# Patient Record
Sex: Male | Born: 1950
Health system: Southern US, Community
[De-identification: ages and names within clinical notes are randomized; demographics above are authoritative.]

## PROBLEM LIST (undated history)

## (undated) DIAGNOSIS — K219 Gastro-esophageal reflux disease without esophagitis: Secondary | ICD-10-CM

## (undated) DIAGNOSIS — K759 Inflammatory liver disease, unspecified: Secondary | ICD-10-CM

## (undated) DIAGNOSIS — I700 Atherosclerosis of aorta: Secondary | ICD-10-CM

## (undated) DIAGNOSIS — I219 Acute myocardial infarction, unspecified: Secondary | ICD-10-CM

## (undated) DIAGNOSIS — I509 Heart failure, unspecified: Secondary | ICD-10-CM

## (undated) DIAGNOSIS — I10 Essential (primary) hypertension: Secondary | ICD-10-CM

## (undated) DIAGNOSIS — B1920 Unspecified viral hepatitis C without hepatic coma: Secondary | ICD-10-CM

## (undated) DIAGNOSIS — N529 Male erectile dysfunction, unspecified: Secondary | ICD-10-CM

## (undated) DIAGNOSIS — I209 Angina pectoris, unspecified: Secondary | ICD-10-CM

## (undated) DIAGNOSIS — I2510 Atherosclerotic heart disease of native coronary artery without angina pectoris: Secondary | ICD-10-CM

## (undated) DIAGNOSIS — M1990 Unspecified osteoarthritis, unspecified site: Secondary | ICD-10-CM

## (undated) DIAGNOSIS — J449 Chronic obstructive pulmonary disease, unspecified: Secondary | ICD-10-CM

## (undated) DIAGNOSIS — J189 Pneumonia, unspecified organism: Secondary | ICD-10-CM

## (undated) DIAGNOSIS — E785 Hyperlipidemia, unspecified: Secondary | ICD-10-CM

## (undated) HISTORY — DX: Atherosclerotic heart disease of native coronary artery without angina pectoris: I25.10

## (undated) HISTORY — DX: Chronic obstructive pulmonary disease, unspecified: J44.9

## (undated) HISTORY — DX: Unspecified osteoarthritis, unspecified site: M19.90

## (undated) HISTORY — PX: NASAL SINUS SURGERY: SHX719

## (undated) HISTORY — PX: ABDOMINAL EXPLORATION SURGERY: SHX538

## (undated) HISTORY — DX: Heart failure, unspecified: I50.9

## (undated) HISTORY — PX: COLONOSCOPY: SHX174

---

## 2008-05-14 ENCOUNTER — Ambulatory Visit: Payer: Self-pay | Admitting: Gastroenterology

## 2008-05-21 ENCOUNTER — Ambulatory Visit: Payer: Self-pay | Admitting: Gastroenterology

## 2008-09-10 ENCOUNTER — Ambulatory Visit: Payer: Self-pay | Admitting: Gastroenterology

## 2019-02-04 ENCOUNTER — Other Ambulatory Visit: Payer: Self-pay

## 2019-02-04 ENCOUNTER — Encounter: Payer: Self-pay | Admitting: Nurse Practitioner

## 2019-02-04 ENCOUNTER — Ambulatory Visit (INDEPENDENT_AMBULATORY_CARE_PROVIDER_SITE_OTHER): Payer: Medicare Other | Admitting: Nurse Practitioner

## 2019-02-04 VITALS — BP 138/82 | HR 86 | Temp 99.2°F | Ht 67.0 in | Wt 138.6 lb

## 2019-02-04 DIAGNOSIS — I10 Essential (primary) hypertension: Secondary | ICD-10-CM | POA: Insufficient documentation

## 2019-02-04 DIAGNOSIS — F17219 Nicotine dependence, cigarettes, with unspecified nicotine-induced disorders: Secondary | ICD-10-CM | POA: Diagnosis not present

## 2019-02-04 DIAGNOSIS — J449 Chronic obstructive pulmonary disease, unspecified: Secondary | ICD-10-CM | POA: Diagnosis not present

## 2019-02-04 DIAGNOSIS — R030 Elevated blood-pressure reading, without diagnosis of hypertension: Secondary | ICD-10-CM | POA: Insufficient documentation

## 2019-02-04 DIAGNOSIS — N529 Male erectile dysfunction, unspecified: Secondary | ICD-10-CM | POA: Diagnosis not present

## 2019-02-04 DIAGNOSIS — Z87891 Personal history of nicotine dependence: Secondary | ICD-10-CM | POA: Insufficient documentation

## 2019-02-04 MED ORDER — PROAIR HFA 108 (90 BASE) MCG/ACT IN AERS: 2.0000 | INHALATION_SPRAY | RESPIRATORY_TRACT | 3 refills | Status: DC | PRN

## 2019-02-04 MED ORDER — SYMBICORT 160-4.5 MCG/ACT IN AERO: 2.0000 | INHALATION_SPRAY | Freq: Two times a day (BID) | RESPIRATORY_TRACT | 3 refills | Status: DC

## 2019-02-04 NOTE — Assessment & Plan Note (Signed)
Chronic, stable.  Continue current inhaler regimen.  Recommend smoking cessation.  Spirometry next visit.  Return in 4 weeks for physical.

## 2019-02-04 NOTE — Assessment & Plan Note (Signed)
Chronic, ongoing.  Continue Sildenafil as needed.  Will bring pill bottle to next visit to ensure correct dose documented.

## 2019-02-04 NOTE — Assessment & Plan Note (Signed)
I have recommended complete cessation of tobacco use. I have discussed various options available for assistance with tobacco cessation including over the counter methods (Nicotine gum, patch and lozenges). We also discussed prescription options (Chantix, Nicotine Inhaler / Nasal Spray). The patient is not interested in pursuing any prescription tobacco cessation options at this time. Recommend he obtain lung CT scan CA screening.

## 2019-02-04 NOTE — Patient Instructions (Signed)
Pneumococcal Conjugate Vaccine (PCV13): What You Need to Know 1. Why get vaccinated? Pneumococcal conjugate vaccine (PCV13) can prevent pneumococcal disease. Pneumococcal disease refers to any illness caused by pneumococcal bacteria. These bacteria can cause many types of illnesses, including pneumonia, which is an infection of the lungs. Pneumococcal bacteria are one of the most common causes of pneumonia. Besides pneumonia, pneumococcal bacteria can also cause:  Ear infections  Sinus infections  Meningitis (infection of the tissue covering the brain and spinal cord)  Bacteremia (bloodstream infection) Anyone can get pneumococcal disease, but children under 2 years of age, people with certain medical conditions, adults 65 years or older, and cigarette smokers are at the highest risk. Most pneumococcal infections are mild. However, some can result in long-term problems, such as brain damage or hearing loss. Meningitis, bacteremia, and pneumonia caused by pneumococcal disease can be fatal. 2. PCV13 PCV13 protects against 13 types of bacteria that cause pneumococcal disease. Infants and young children usually need 4 doses of pneumococcal conjugate vaccine, at 2, 4, 6, and 12-15 months of age. In some cases, a child might need fewer than 4 doses to complete PCV13 vaccination. A dose of PCV23 vaccine is also recommended for anyone 2 years or older with certain medical conditions if they did not already receive PCV13. This vaccine may be given to adults 65 years or older based on discussions between the patient and health care provider. 3. Talk with your health care provider Tell your vaccine provider if the person getting the vaccine:  Has had an allergic reaction after a previous dose of PCV13, to an earlier pneumococcal conjugate vaccine known as PCV7, or to any vaccine containing diphtheria toxoid (for example, DTaP), or has any severe, life-threatening allergies.  In some cases, your health  care provider may decide to postpone PCV13 vaccination to a future visit. People with minor illnesses, such as a cold, may be vaccinated. People who are moderately or severely ill should usually wait until they recover before getting PCV13. Your health care provider can give you more information. 4. Risks of a vaccine reaction  Redness, swelling, pain, or tenderness where the shot is given, and fever, loss of appetite, fussiness (irritability), feeling tired, headache, and chills can happen after PCV13. Young children may be at increased risk for seizures caused by fever after PCV13 if it is administered at the same time as inactivated influenza vaccine. Ask your health care provider for more information. People sometimes faint after medical procedures, including vaccination. Tell your provider if you feel dizzy or have vision changes or ringing in the ears. As with any medicine, there is a very remote chance of a vaccine causing a severe allergic reaction, other serious injury, or death. 5. What if there is a serious problem? An allergic reaction could occur after the vaccinated person leaves the clinic. If you see signs of a severe allergic reaction (hives, swelling of the face and throat, difficulty breathing, a fast heartbeat, dizziness, or weakness), call 9-1-1 and get the person to the nearest hospital. For other signs that concern you, call your health care provider. Adverse reactions should be reported to the Vaccine Adverse Event Reporting System (VAERS). Your health care provider will usually file this report, or you can do it yourself. Visit the VAERS website at www.vaers.hhs.gov or call 1-800-822-7967. VAERS is only for reporting reactions, and VAERS staff do not give medical advice. 6. The National Vaccine Injury Compensation Program The National Vaccine Injury Compensation Program (VICP) is a federal program   that was created to compensate people who may have been injured by certain  vaccines. Visit the VICP website at www.hrsa.gov/vaccinecompensation or call 1-800-338-2382 to learn about the program and about filing a claim. There is a time limit to file a claim for compensation. 7. How can I learn more?  Ask your health care provider.  Call your local or state health department.  Contact the Centers for Disease Control and Prevention (CDC): ? Call 1-800-232-4636 (1-800-CDC-INFO) or ? Visit CDC's website at www.cdc.gov/vaccines Vaccine Information Statement PCV13 Vaccine (05/14/2018) This information is not intended to replace advice given to you by your health care provider. Make sure you discuss any questions you have with your health care provider. Document Released: 04/29/2006 Document Revised: 10/21/2018 Document Reviewed: 02/11/2018 Elsevier Patient Education  2020 [employer]  

## 2019-02-04 NOTE — Assessment & Plan Note (Signed)
Initial BP elevated with repeat at goal.  He endorses initial BP is "always" high.  Recommend he check BP at home a few mornings a week and document for provider.
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
## 2019-03-10 ENCOUNTER — Other Ambulatory Visit: Payer: Self-pay

## 2019-03-10 ENCOUNTER — Ambulatory Visit (INDEPENDENT_AMBULATORY_CARE_PROVIDER_SITE_OTHER): Payer: Medicare Other | Admitting: Nurse Practitioner

## 2019-03-10 ENCOUNTER — Encounter: Payer: Self-pay | Admitting: Nurse Practitioner

## 2019-03-10 VITALS — BP 138/72 | HR 66 | Temp 98.2°F | Ht 67.0 in | Wt 138.0 lb

## 2019-03-10 DIAGNOSIS — R030 Elevated blood-pressure reading, without diagnosis of hypertension: Secondary | ICD-10-CM

## 2019-03-10 DIAGNOSIS — Z114 Encounter for screening for human immunodeficiency virus [HIV]: Secondary | ICD-10-CM

## 2019-03-10 DIAGNOSIS — J449 Chronic obstructive pulmonary disease, unspecified: Secondary | ICD-10-CM

## 2019-03-10 DIAGNOSIS — Z1159 Encounter for screening for other viral diseases: Secondary | ICD-10-CM

## 2019-03-10 DIAGNOSIS — Z0000 Encounter for general adult medical examination without abnormal findings: Secondary | ICD-10-CM

## 2019-03-10 DIAGNOSIS — F17219 Nicotine dependence, cigarettes, with unspecified nicotine-induced disorders: Secondary | ICD-10-CM | POA: Diagnosis not present

## 2019-03-10 NOTE — Assessment & Plan Note (Signed)
Ongoing with initial BP elevated, but repeat improved at goal.  Recommend he monitor BP at home three mornings a week and document for provider, educated him on this.  Continue diet and exercise focus.  Return in 6 months for follow-up.

## 2019-03-10 NOTE — Assessment & Plan Note (Addendum)
Chronic, stable.  Spirometry FEV1/FVC 90% and FEV1 76%.  Minimal use of Proair.  Recommend complete smoking cessation to slow down progression of disease process.  Continue current medication regimen.  He refuses Lung CA Screening CT at this time.  Return in 6 months for follow-up.

## 2019-03-10 NOTE — Progress Notes (Signed)
BP 138/72 (BP Location: Left Arm, Patient Position: Sitting)   Pulse 66   Temp 98.2 F (36.8 C) (Oral)   Ht 5\' 7"  (1.702 m)   Wt 138 lb (62.6 kg)   SpO2 98%   BMI 21.61 kg/m    Subjective:    Patient ID: Andrew Potts, male    DOB: 09-09-1950, 68 y.o.   MRN: [id number]  HPI: Andrew Potts is a 68 y.o. male presenting on 03/10/2019 for comprehensive medical examination. Current medical complaints include:none  He currently lives with: self Interim Problems from his last visit: no   COPD Using Symbicort twice day. Continues to smoke 1/2 PPD, at times less.  Has smoked since age 68.  He has tried Chantix in past which caused stomach upset and Wellbutrin he tried years ago, but does not recall side effect.  Recommended CT lung CA screening, he wishes to hold off at this time. COPD status: stable Satisfied with current treatment?: yes Oxygen use: no Dyspnea frequency:  Cough frequency:  Rescue inhaler frequency: uses at times twice a week. Limitation of activity: no Productive cough:  Last Spirometry:  Pneumovax: has one, can not recall what year Influenza: Up to Date   WHITE COAT HYPERTENSION: Endorses similar at previous PCP.  States his BP is always initially elevated, gets nervous with provider offices, but then trends down on recheck.   Aspirin: no Recurrent headaches: no Visual changes: no Palpitations: no Dyspnea: no Chest pain: no Lower extremity edema: no Dizzy/lightheaded: no  Functional Status Survey: Is the patient deaf or have difficulty hearing?: No Does the patient have difficulty seeing, even when wearing glasses/contacts?: No Does the patient have difficulty concentrating, remembering, or making decisions?: No Does the patient have difficulty walking or climbing stairs?: No Does the patient have difficulty dressing or bathing?: No Does the patient have difficulty doing errands alone such as visiting a doctor's office or shopping?: No  FALL RISK:  Fall Risk  03/10/2019 02/04/2019  Falls in the past year? 0 0  Number falls in past yr: 0 0  Injury with Fall? 0 0  Follow up Falls evaluation completed Falls evaluation completed    Depression Screen [facility] 03/10/2019 02/04/2019  Decreased Interest 0 0  Down, Depressed, Hopeless 0 0  PHQ - 2 Score 0 0  Altered sleeping 0 0  Tired, decreased energy 0 0  Change in appetite 0 0  Feeling bad or failure about yourself  0 0  Trouble concentrating 0 0  Moving slowly or fidgety/restless 0 0  Suicidal thoughts 0 0  PHQ-9 Score 0 0  Difficult doing work/chores Not difficult at all Not difficult at all    Advanced Directives <no information>  Past Medical History:  Past Medical History:  Diagnosis Date  . Arthritis   . COPD (chronic obstructive pulmonary disease) (HCC)     Surgical History:  Past Surgical History:  Procedure Laterality Date  . ABDOMINAL EXPLORATION SURGERY      Medications:  Current Outpatient Medications on File Prior to Visit  Medication Sig  . PROAIR HFA 108 (90 Base) MCG/ACT inhaler Inhale 2 puffs into the lungs every 4 (four) hours as needed.  . sildenafil (REVATIO) 20 MG tablet Take 20 mg by mouth as needed. Daily only  . SYMBICORT 160-4.5 MCG/ACT inhaler Inhale 2 puffs into the lungs 2 (two) times daily.   No current facility-administered medications on file prior to visit.     Allergies:  Allergies  Allergen Reactions  . Codone [Hydrocodone] Other (See Comments)    Abdominal pain  . Levaquin [Levofloxacin] Swelling    Social History:  Social History   Socioeconomic History  . Marital status: Divorced    Spouse name: Not on file  . Number of children: Not on file  . Years of education: Not on file  . Highest education level: Not on file  Occupational History  . Occupation: unload trucks    Comment: [employer]  Social Needs  . Financial resource strain: Not hard at all  . Food insecurity    Worry: Never true     Inability: Never true  . Transportation needs    Medical: No    Non-medical: No  Tobacco Use  . Smoking status: Current Every Day Smoker    Packs/day: 0.50    Years: 47.00    Pack years: 23.50  . Smokeless tobacco: Never Used  Substance and Sexual Activity  . Alcohol use: Not Currently  . Drug use: Never  . Sexual activity: Yes  Lifestyle  . Physical activity    Days per week: 6 days    Minutes per session: 30 min  . Stress: Not at all  Relationships  . Social Musician on phone: Three times a week    Gets together: Three times a week    Attends religious service: Never    Active member of club or organization: No    Attends meetings of clubs or organizations: Never    Relationship status: Not on file  . Intimate partner violence    Fear of current or ex partner: No    Emotionally abused: No    Physically abused: No    Forced sexual activity: No  Other Topics Concern  . Not on file  Social History Narrative  . Not on file   Social History   Tobacco Use  Smoking Status Current Every Day Smoker  . Packs/day: 0.50  . Years: 47.00  . Pack years: 23.50  Smokeless Tobacco Never Used   Social History   Substance and Sexual Activity  Alcohol Use Not Currently    Family History:  Family History  Problem Relation Age of Onset  . Alcohol abuse Father   . Alcohol abuse Brother   . Aneurysm Brother     Past medical history, surgical history, medications, allergies, family history and social history reviewed with patient today and changes made to appropriate areas of the chart.   Review of Systems - negative All other ROS negative except what is listed above and in the HPI.      Objective:    BP 138/72 (BP Location: Left Arm, Patient Position: Sitting)   Pulse 66   Temp 98.2 F (36.8 C) (Oral)   Ht 5\' 7"  (1.702 m)   Wt 138 lb (62.6 kg)   SpO2 98%   BMI 21.61 kg/m   Wt Readings from Last 3 Encounters:  03/10/19 138 lb (62.6 kg)  02/04/19 138  lb 9.6 oz (62.9 kg)    Physical Exam Vitals signs and nursing note reviewed.  Constitutional:      General: He is awake. He is not in acute distress.    Appearance: He is well-developed. He is not ill-appearing.  HENT:     Head: Normocephalic and atraumatic.     Right Ear: Hearing, tympanic membrane, ear canal and external ear normal. No drainage.     Left Ear: Hearing, tympanic membrane, ear canal and external  ear normal. No drainage.     Nose: Nose normal.     Mouth/Throat:     Pharynx: Oropharynx is clear. Uvula midline.  Eyes:     General: Lids are normal.        Right eye: No discharge.        Left eye: No discharge.     Extraocular Movements: Extraocular movements intact.     Conjunctiva/sclera: Conjunctivae normal.     Pupils: Pupils are equal, round, and reactive to light.     Visual Fields: Right eye visual fields normal and left eye visual fields normal.  Neck:     Musculoskeletal: Normal range of motion and neck supple.     Thyroid: No thyromegaly.     Vascular: No carotid bruit.  Cardiovascular:     Rate and Rhythm: Normal rate and regular rhythm.     Heart sounds: Normal heart sounds, S1 normal and S2 normal. No murmur. No gallop.   Pulmonary:     Effort: Pulmonary effort is normal. No accessory muscle usage or respiratory distress.     Breath sounds: Normal breath sounds.  Abdominal:     General: Bowel sounds are normal.     Palpations: Abdomen is soft. There is no hepatomegaly or splenomegaly.     Tenderness: There is no abdominal tenderness.     Hernia: No hernia is present.  Genitourinary:    Scrotum/Testes: Normal.        Right: Mass not present.        Left: Mass not present.     Comments: Deferred per patient request. Musculoskeletal: Normal range of motion.     Right lower leg: No edema.     Left lower leg: No edema.  Lymphadenopathy:     Head:     Right side of head: No submental, submandibular, tonsillar, preauricular or posterior auricular  adenopathy.     Left side of head: No submental, submandibular, tonsillar, preauricular or posterior auricular adenopathy.     Cervical: No cervical adenopathy.  Skin:    General: Skin is warm and dry.     Capillary Refill: Capillary refill takes less than 2 seconds.     Findings: No rash.  Neurological:     Mental Status: He is alert and oriented to person, place, and time.     Deep Tendon Reflexes: Reflexes are normal and symmetric.  Psychiatric:        Attention and Perception: Attention normal.        Mood and Affect: Mood normal.        Speech: Speech normal.        Behavior: Behavior normal. Behavior is cooperative.        Thought Content: Thought content normal.        Cognition and Memory: Cognition normal.        Judgment: Judgment normal.    6CIT Screen 03/10/2019  What Year? 0 points  What month? 0 points  What time? 0 points  Count back from 20 0 points  Months in reverse 0 points  Repeat phrase 0 points  Total Score 0    No results found for this or any previous visit.    Assessment & Plan:   Problem List Items Addressed This Visit      Cardiovascular and Mediastinum   White coat syndrome without diagnosis of hypertension    Ongoing with initial BP elevated, but repeat improved at goal.  Recommend he monitor BP at home three mornings  a week and document for provider, educated him on this.  Continue diet and exercise focus.  Return in 6 months for follow-up.      Relevant Orders   Comprehensive metabolic panel     Respiratory   COPD (chronic obstructive pulmonary disease) (HCC) - Primary    Chronic, stable.  Spirometry FEV1/FVC 90% and FEV1 76%.  Minimal use of Proair.  Recommend complete smoking cessation to slow down progression of disease process.  Continue current medication regimen.  He refuses Lung CA Screening CT at this time.  Return in 6 months for follow-up.      Relevant Orders   Spirometry with Graph (Completed)     Nervous and Auditory    Nicotine dependence, cigarettes, w unsp disorders    I have recommended complete cessation of tobacco use. I have discussed various options available for assistance with tobacco cessation including over the counter methods (Nicotine gum, patch and lozenges). We also discussed prescription options (Chantix, Nicotine Inhaler / Nasal Spray). The patient is not interested in pursuing any prescription tobacco cessation options at this time.       Other Visit Diagnoses    Encounter for annual physical exam       Relevant Orders   CBC with Differential/Platelet   Lipid Panel w/o Chol/HDL Ratio   TSH   Need for hepatitis C screening test       Relevant Orders   Hepatitis C antibody   Encounter for screening for HIV       Relevant Orders   HIV Antibody (routine testing w rflx)       Discussed aspirin prophylaxis for myocardial infarction prevention and decision was it was not indicated  LABORATORY TESTING:  Health maintenance labs ordered today as discussed above.   The natural history of prostate cancer and ongoing controversy regarding screening and potential treatment outcomes of prostate cancer has been discussed with the patient. The meaning of a false positive PSA and a false negative PSA has been discussed. He indicates understanding of the limitations of this screening test and wishes not to proceed with screening PSA testing.   IMMUNIZATIONS:   - Tdap: Tetanus vaccination status reviewed: last tetanus booster within 10 years. - Influenza: Up to date - Pneumovax: he reports he has recevied in past - Prevnar: Not applicable - Zostavax vaccine: Refused  SCREENING: - Colonoscopy: Refused , also refuses Cologuard at this time Discussed with patient purpose of the colonoscopy is to detect colon cancer at curable precancerous or early stages   - AAA Screening: Not applicable  -Hearing Test: Not applicable  -Spirometry: Up to date   PATIENT COUNSELING:    Sexuality: Discussed  sexually transmitted diseases, partner selection, use of condoms, avoidance of unintended pregnancy  and contraceptive alternatives.   Advised to avoid cigarette smoking.  I discussed with the patient that most people either abstain from alcohol or drink within safe limits (<=14/week and <=4 drinks/occasion for males, <=7/weeks and <= 3 drinks/occasion for females) and that the risk for alcohol disorders and other health effects rises proportionally with the number of drinks per week and how often a drinker exceeds daily limits.  Discussed cessation/primary prevention of drug use and availability of treatment for abuse.   Diet: Encouraged to adjust caloric intake to maintain  or achieve ideal body weight, to reduce intake of dietary saturated fat and total fat, to limit sodium intake by avoiding high sodium foods and not adding table salt, and to maintain adequate dietary  potassium and calcium preferably from fresh fruits, vegetables, and low-fat dairy products.    stressed the importance of regular exercise  Injury prevention: Discussed safety belts, safety helmets, smoke detector, smoking near bedding or upholstery.   Dental health: Discussed importance of regular tooth brushing, flossing, and dental visits.   Follow up plan: NEXT PREVENTATIVE PHYSICAL DUE IN 1 YEAR. Return in about 6 months (around 09/10/2019) for COPD and White Coat HTN.

## 2019-03-10 NOTE — Patient Instructions (Addendum)
Steps to Quit Smoking Smoking tobacco is the leading cause of preventable death. It can affect almost every organ in the body. Smoking puts you and people around you at risk for many serious, long-lasting (chronic) diseases. Quitting smoking can be hard, but it is one of the best things that you can do for your health. It is never too late to quit. How do I get ready to quit? When you decide to quit smoking, make a plan to help you succeed. Before you quit:  Pick a date to quit. Set a date within the next 2 weeks to give you time to prepare.  Write down the reasons why you are quitting. Keep this list in places where you will see it often.  Tell your family, friends, and co-workers that you are quitting. Their support is important.  Talk with your doctor about the choices that may help you quit.  Find out if your health insurance will pay for these treatments.  Know the people, places, things, and activities that make you want to smoke (triggers). Avoid them. What first steps can I take to quit smoking?  Throw away all cigarettes at home, at work, and in your car.  Throw away the things that you use when you smoke, such as ashtrays and lighters.  Clean your car. Make sure to empty the ashtray.  Clean your home, including curtains and carpets. What can I do to help me quit smoking? Talk with your doctor about taking medicines and seeing a counselor at the same time. You are more likely to succeed when you do both.  If you are pregnant or breastfeeding, talk with your doctor about counseling or other ways to quit smoking. Do not take medicine to help you quit smoking unless your doctor tells you to do so. To quit smoking: Quit right away  Quit smoking totally, instead of slowly cutting back on how much you smoke over a period of time.  Go to counseling. You are more likely to quit if you go to counseling sessions regularly. Take medicine You may take medicines to help you quit. Some  medicines need a prescription, and some you can buy over-the-counter. Some medicines may contain a drug called nicotine to replace the nicotine in cigarettes. Medicines may:  Help you to stop having the desire to smoke (cravings).  Help to stop the problems that come when you stop smoking (withdrawal symptoms). Your doctor may ask you to use:  Nicotine patches, gum, or lozenges.  Nicotine inhalers or sprays.  Non-nicotine medicine that is taken by mouth. Find resources Find resources and other ways to help you quit smoking and remain smoke-free after you quit. These resources are most helpful when you use them often. They include:  Online chats with a counselor.  Phone quitlines.  Printed self-help materials.  Support groups or group counseling.  Text messaging programs.  Mobile phone apps. Use apps on your mobile phone or tablet that can help you stick to your quit plan. There are many free apps for mobile phones and tablets as well as websites. Examples include Quit Guide from the CDC and smokefree.gov  What things can I do to make it easier to quit?   Talk to your family and friends. Ask them to support and encourage you.  Call a phone quitline (1-800-QUIT-NOW), reach out to support groups, or work with a counselor.  Ask people who smoke to not smoke around you.  Avoid places that make you want to smoke,   such as: ? Bars. ? Parties. ? Smoke-break areas at work.  Spend time with people who do not smoke.  Lower the stress in your life. Stress can make you want to smoke. Try these things to help your stress: ? Getting regular exercise. ? Doing deep-breathing exercises. ? Doing yoga. ? Meditating. ? Doing a body scan. To do this, close your eyes, focus on one area of your body at a time from head to toe. Notice which parts of your body are tense. Try to relax the muscles in those areas. How will I feel when I quit smoking? Day 1 to 3 weeks Within the first 24 hours,  you may start to have some problems that come from quitting tobacco. These problems are very bad 2-3 days after you quit, but they do not often last for more than 2-3 weeks. You may get these symptoms:  Mood swings.  Feeling restless, nervous, angry, or annoyed.  Trouble concentrating.  Dizziness.  Strong desire for high-sugar foods and nicotine.  Weight gain.  Trouble pooping (constipation).  Feeling like you may vomit (nausea).  Coughing or a sore throat.  Changes in how the medicines that you take for other issues work in your body.  Depression.  Trouble sleeping (insomnia). Week 3 and afterward After the first 2-3 weeks of quitting, you may start to notice more positive results, such as:  Better sense of smell and taste.  Less coughing and sore throat.  Slower heart rate.  Lower blood pressure.  Clearer skin.  Better breathing.  Fewer sick days. Quitting smoking can be hard. Do not give up if you fail the first time. Some people need to try a few times before they succeed. Do your best to stick to your quit plan, and talk with your doctor if you have any questions or concerns. Summary  Smoking tobacco is the leading cause of preventable death. Quitting smoking can be hard, but it is one of the best things that you can do for your health.  When you decide to quit smoking, make a plan to help you succeed.  Quit smoking right away, not slowly over a period of time.  When you start quitting, seek help from your doctor, family, or friends. This information is not intended to replace advice given to you by your health care provider. Make sure you discuss any questions you have with your health care provider. Document Released: 04/28/2009 Document Revised: 09/19/2018 Document Reviewed: 09/20/2018 Elsevier Patient Education  2020 [place] Maintenance, Male Adopting a healthy lifestyle and getting preventive care are important in promoting health and  wellness. Ask your health care provider about:  The right schedule for you to have regular tests and exams.  Things you can do on your own to prevent diseases and keep yourself healthy. What should I know about diet, weight, and exercise? Eat a healthy diet   Eat a diet that includes plenty of vegetables, fruits, low-fat dairy products, and lean protein.  Do not eat a lot of foods that are high in solid fats, added sugars, or sodium. Maintain a healthy weight Body mass index (BMI) is a measurement that can be used to identify possible weight problems. It estimates body fat based on height and weight. Your health care provider can help determine your BMI and help you achieve or maintain a healthy weight. Get regular exercise Get regular exercise. This is one of the most important things you can do for your health.  Most adults should:  Exercise for at least 150 minutes each week. The exercise should increase your heart rate and make you sweat (moderate-intensity exercise).  Do strengthening exercises at least twice a week. This is in addition to the moderate-intensity exercise.  Spend less time sitting. Even light physical activity can be beneficial. Watch cholesterol and blood lipids Have your blood tested for lipids and cholesterol at 66 years of age, then have this test every 5 years. You may need to have your cholesterol levels checked more often if:  Your lipid or cholesterol levels are high.  You are older than 66 years of age.  You are at high risk for heart disease. What should I know about cancer screening? Many types of cancers can be detected early and may often be prevented. Depending on your health history and family history, you may need to have cancer screening at various ages. This may include screening for:  Colorectal cancer.  Prostate cancer.  Skin cancer.  Lung cancer. What should I know about heart disease, diabetes, and high blood pressure? Blood pressure  and heart disease  High blood pressure causes heart disease and increases the risk of stroke. This is more likely to develop in people who have high blood pressure readings, are of African descent, or are overweight.  Talk with your health care provider about your target blood pressure readings.  Have your blood pressure checked: ? Every 3-5 years if you are 218-339 years of age. ? Every year if you are 66 years old or older.  If you are between the ages of 1565 and 5075 and are a current or former smoker, ask your health care provider if you should have a one-time screening for abdominal aortic aneurysm (AAA). Diabetes Have regular diabetes screenings. This checks your fasting blood sugar level. Have the screening done:  Once every three years after age 66 if you are at a normal weight and have a low risk for diabetes.  More often and at a younger age if you are overweight or have a high risk for diabetes. What should I know about preventing infection? Hepatitis B If you have a higher risk for hepatitis B, you should be screened for this virus. Talk with your health care provider to find out if you are at risk for hepatitis B infection. Hepatitis C Blood testing is recommended for:  Everyone born from [birth year] through 1963.  Anyone with known risk factors for hepatitis C. Sexually transmitted infections (STIs)  You should be screened each year for STIs, including gonorrhea and chlamydia, if: ? You are sexually active and are younger than 66 years of age. ? You are older than 66 years of age and your health care provider tells you that you are at risk for this type of infection. ? Your sexual activity has changed since you were last screened, and you are at increased risk for chlamydia or gonorrhea. Ask your health care provider if you are at risk.  Ask your health care provider about whether you are at high risk for HIV. Your health care provider may recommend a prescription medicine to help  prevent HIV infection. If you choose to take medicine to prevent HIV, you should first get tested for HIV. You should then be tested every 3 months for as long as you are taking the medicine. Follow these instructions at home: Lifestyle  Do not use any products that contain nicotine or tobacco, such as cigarettes, e-cigarettes, and chewing tobacco. If you  need help quitting, ask your health care provider.  Do not use street drugs.  Do not share needles.  Ask your health care provider for help if you need support or information about quitting drugs. Alcohol use  Do not drink alcohol if your health care provider tells you not to drink.  If you drink alcohol: ? Limit how much you have to 0-2 drinks a day. ? Be aware of how much alcohol is in your drink. In the [place] one drink equals one 12 oz bottle of beer (355 mL), one 5 oz glass of wine (148 mL), or one 1 oz glass of hard liquor (44 mL). General instructions  Schedule regular health, dental, and eye exams.  Stay current with your vaccines.  Tell your health care provider if: ? You often feel depressed. ? You have ever been abused or do not feel safe at home. Summary  Adopting a healthy lifestyle and getting preventive care are important in promoting health and wellness.  Follow your health care provider's instructions about healthy diet, exercising, and getting tested or screened for diseases.  Follow your health care provider's instructions on monitoring your cholesterol and blood pressure. This information is not intended to replace advice given to you by your health care provider. Make sure you discuss any questions you have with your health care provider. Document Released: 12/29/2007 Document Revised: 06/25/2018 Document Reviewed: 06/25/2018 Elsevier Patient Education  2020 [employer]  American Heart Association (AHA) Exercise Recommendation  Being physically active is important to prevent heart disease and stroke,  the nation's No. 1and No. 5killers. To improve overall cardiovascular health, we suggest at least 150 minutes per week of moderate exercise or 75 minutes per week of vigorous exercise (or a combination of moderate and vigorous activity). Thirty minutes a day, five times a week is an easy goal to remember. You will also experience benefits even if you divide your time into two or three segments of 10 to 15 minutes per day.  For people who would benefit from lowering their blood pressure or cholesterol, we recommend 40 minutes of aerobic exercise of moderate to vigorous intensity three to four times a week to lower the risk for heart attack and stroke.  Physical activity is anything that makes you move your body and burn calories.  This includes things like climbing stairs or playing sports. Aerobic exercises benefit your heart, and include walking, jogging, swimming or biking. Strength and stretching exercises are best for overall stamina and flexibility.  The simplest, positive change you can make to effectively improve your heart health is to start walking. It's enjoyable, free, easy, social and great exercise. A walking program is flexible and boasts high success rates because people can stick with it. It's easy for walking to become a regular and satisfying part of life.   For Overall Cardiovascular Health:  At least 30 minutes of moderate-intensity aerobic activity at least 5 days per week for a total of 150  OR   At least 25 minutes of vigorous aerobic activity at least 3 days per week for a total of 75 minutes; or a combination of moderate- and vigorous-intensity aerobic activity  AND   Moderate- to high-intensity muscle-strengthening activity at least 2 days per week for additional health benefits.  For Lowering Blood Pressure and Cholesterol  An average 40 minutes of moderate- to vigorous-intensity aerobic activity 3 or 4 times per week  What if I can't make it to the time goal?  Something is  always better than nothing! And everyone has to start somewhere. Even if you've been sedentary for years, today is the day you can begin to make healthy changes in your life. If you don't think you'll make it for 30 or 40 minutes, set a reachable goal for today. You can work up toward your overall goal by increasing your time as you get stronger. Don't let all-or-nothing thinking rob you of doing what you can every day.  Source:http://www.heart.org

## 2019-03-10 NOTE — Assessment & Plan Note (Signed)
I have recommended complete cessation of tobacco use. I have discussed various options available for assistance with tobacco cessation including over the counter methods (Nicotine gum, patch and lozenges). We also discussed prescription options (Chantix, Nicotine Inhaler / Nasal Spray). The patient is not interested in pursuing any prescription tobacco cessation options at this time.  

## 2019-03-11 ENCOUNTER — Telehealth: Payer: Self-pay | Admitting: Nurse Practitioner

## 2019-03-11 DIAGNOSIS — K732 Chronic active hepatitis, not elsewhere classified: Secondary | ICD-10-CM | POA: Insufficient documentation

## 2019-03-11 DIAGNOSIS — R768 Other specified abnormal immunological findings in serum: Secondary | ICD-10-CM

## 2019-03-11 LAB — TSH: TSH: 0.765 u[IU]/mL (ref 0.450–4.500)

## 2019-03-11 LAB — LIPID PANEL W/O CHOL/HDL RATIO
Cholesterol, Total: 196 mg/dL (ref 100–199)
HDL: 51 mg/dL (ref >39)
LDL Calculated: 128 mg/dL — ABNORMAL HIGH (ref 0–99)
Triglycerides: 83 mg/dL (ref 0–149)
VLDL Cholesterol Cal: 17 mg/dL (ref 5–40)

## 2019-03-11 LAB — CBC WITH DIFFERENTIAL/PLATELET
Basophils Absolute: 0 10*3/uL (ref 0.0–0.2)
Basos: 1 %
EOS (ABSOLUTE): 0.4 10*3/uL (ref 0.0–0.4)
Eos: 5 %
Hematocrit: 49.9 % (ref 37.5–51.0)
Hemoglobin: 16.9 g/dL (ref 13.0–17.7)
Immature Grans (Abs): 0 10*3/uL (ref 0.0–0.1)
Immature Granulocytes: 0 %
Lymphocytes Absolute: 2.7 10*3/uL (ref 0.7–3.1)
Lymphs: 33 %
MCH: 33.6 pg — ABNORMAL HIGH (ref 26.6–33.0)
MCHC: 33.9 g/dL (ref 31.5–35.7)
MCV: 99 fL — ABNORMAL HIGH (ref 79–97)
Monocytes Absolute: 0.8 10*3/uL (ref 0.1–0.9)
Monocytes: 9 %
Neutrophils Absolute: 4.4 10*3/uL (ref 1.4–7.0)
Neutrophils: 52 %
Platelets: 262 10*3/uL (ref 150–450)
RBC: 5.03 x10E6/uL (ref 4.14–5.80)
RDW: 12.3 % (ref 11.6–15.4)
WBC: 8.3 10*3/uL (ref 3.4–10.8)

## 2019-03-11 LAB — COMPREHENSIVE METABOLIC PANEL
ALT: 29 IU/L (ref 0–44)
AST: 33 IU/L (ref 0–40)
Albumin/Globulin Ratio: 1.7 (ref 1.2–2.2)
Albumin: 4.9 g/dL — ABNORMAL HIGH (ref 3.8–4.8)
Alkaline Phosphatase: 78 IU/L (ref 39–117)
BUN/Creatinine Ratio: 13 (ref 10–24)
BUN: 10 mg/dL (ref 8–27)
Bilirubin Total: 1 mg/dL (ref 0.0–1.2)
CO2: 24 mmol/L (ref 20–29)
Calcium: 10 mg/dL (ref 8.6–10.2)
Chloride: 101 mmol/L (ref 96–106)
Creatinine, Ser: 0.76 mg/dL (ref 0.76–1.27)
GFR calc Af Amer: 111 mL/min/{1.73_m2} (ref >59)
GFR calc non Af Amer: 96 mL/min/{1.73_m2} (ref >59)
Globulin, Total: 2.9 g/dL (ref 1.5–4.5)
Glucose: 101 mg/dL — ABNORMAL HIGH (ref 65–99)
Potassium: 4.5 mmol/L (ref 3.5–5.2)
Sodium: 142 mmol/L (ref 134–144)
Total Protein: 7.8 g/dL (ref 6.0–8.5)

## 2019-03-11 LAB — HIV ANTIBODY (ROUTINE TESTING W REFLEX): HIV Screen 4th Generation wRfx: NONREACTIVE

## 2019-03-11 LAB — HEPATITIS C ANTIBODY: Hep C Virus Ab: >11 s/co ratio — ABNORMAL HIGH (ref 0.0–0.9)

## 2019-03-11 NOTE — Telephone Encounter (Addendum)
Spoke to patient via telephone to review recent labs.  Discussed cholesterol panel, at this time he wishes to focus on diet.  Discussed with him that Hep C Ab testing did return positive.  At length discussion with him on this topic and discussed that his liver function tests are normal.  He denies any past IV drug use of heavy alcohol use.  Discussed with him different modes of transmission and next step to take for further work-up.  Will place GI referral at this time for further evaluation.

## 2019-04-22 ENCOUNTER — Ambulatory Visit: Payer: Medicare Other | Admitting: Gastroenterology

## 2019-04-22 ENCOUNTER — Other Ambulatory Visit: Payer: Self-pay

## 2019-04-22 VITALS — BP 162/77 | HR 76 | Temp 98.5°F | Ht 67.0 in | Wt 142.2 lb

## 2019-04-22 DIAGNOSIS — R768 Other specified abnormal immunological findings in serum: Secondary | ICD-10-CM

## 2019-04-22 NOTE — Progress Notes (Signed)
Andrew Mood MD, MRCP(U.K) [address]  [address]  [place], [place] [address]  Main: [phone]  Fax: [phone]   Gastroenterology Consultation  Referring Provider:     [name], NP Primary Care Physician:  Andrew Skiff, NP Primary Gastroenterologist:  Dr. Wyline Potts  Reason for Consultation:     Hepatitis C antibody that is positive        HPI:   Andrew Potts is a 68 y.o. y/o male referred for consultation & management  by Dr. [name], NP.    She has been referred for hepatitis C virus antibody that is positive on 03/10/2019.  HIV negative, TSH, CMP, CBC normal.  This is a first time he has been aware of his hepatitis C test.  Denies any past intravenous drug use or snorting of cocaine.  Denies any prior tattoos, incarceration, Financial pl[employer].  He thinks he may have had a blood transfusion in 1973.  Denies any excess alcohol consumption.  Denies any prior treatment for hepatitis C.   Past Medical History:  Diagnosis Date  . Arthritis   . COPD (chronic obstructive pulmonary disease) (HCC)     Past Surgical History:  Procedure Laterality Date  . ABDOMINAL EXPLORATION SURGERY      Prior to Admission medications   Medication Sig Start Date End Date Taking? Authorizing Provider  Andrew Potts [employer]) MCG/ACT inhaler Inhale 2 puffs into the lungs every 4 (four) hours as needed. 02/04/19   Potts, Andrew Dandy T, NP  sildenafil (Andrew Potts) 20 MG tablet Take 20 mg by mouth as needed. Daily only    [provider]  Andrew Potts 160-4.5 MCG/ACT inhaler Inhale 2 puffs into the lungs 2 (two) times daily. 02/04/19   Andrew Skiff, NP    Family History  Problem Relation Age of Onset  . Alcohol abuse Father   . Alcohol abuse Brother   . Aneurysm Brother      Social History   Tobacco Use  . Smoking status: Current Every Day Smoker    Packs/day: 0.50    Years: 47.00    Pack years: 23.50  . Smokeless tobacco: Never Used  Substance  Use Topics  . Alcohol use: Not Currently  . Drug use: Never    Allergies as of 04/22/2019 - Review Complete 03/10/2019  Allergen Reaction Noted  . Codone [hydrocodone] Other (See Comments) 02/04/2019  . Levaquin [levofloxacin] Swelling 02/04/2019    Review of Systems:    All systems reviewed and negative except where noted in HPI.   Physical Exam:  There were no vitals taken for this visit. No LMP for male patient. Psych:  Alert and cooperative. Normal Potts and affect. General:   Alert,  Well-developed, well-nourished, pleasant and cooperative in NAD Head:  Normocephalic and atraumatic. Eyes:  Sclera clear, no icterus.   Conjunctiva pink. Ears:  Normal auditory acuity. Nose:  No deformity, discharge, or lesions. Mouth:  No deformity or lesions,oropharynx pink & moist. Neck:  Supple; no masses or thyromegaly. Lungs:  Respirations even and unlabored.  Clear throughout to auscultation.   No wheezes, crackles, or rhonchi. No acute distress. Heart:  Regular rate and rhythm; no murmurs, clicks, rubs, or gallops. Abdomen:  Normal bowel sounds.  No bruits.  Soft, non-tender and non-distended without masses, hepatosplenomegaly or hernias noted.  No guarding or rebound tenderness.    Neurologic:  Alert and oriented x3;  grossly normal neurologically. Skin:  Intact without significant lesions or rashes. No  jaundice. Lymph Nodes:  No significant cervical adenopathy. Psych:  Alert and cooperative. Normal Potts and affect.  Imaging Studies: No results found.  Assessment and Plan:   Andrew Potts is a 68 y.o. y/o male has been referred for positive hepatitis C virus antibody.  HIV negative.  I will rule out hepatitis B coinfection and confirm that she has active hepatitis C.  Less than 5% of individuals may spontaneously seroconvert from hepatitis C.  Plan 1.  Obtain labs to confirm active hepatitis C, rule out coinfection with hepatitis B.  If positive will obtain elastography of the  liver and subsequently discuss treatment options.  2.  Discussed briefly about colon cancer screening he is due for colonoscopy but he would like to wait at this point of time. Follow up in 2 months  Dr Andrew Bellows MD,MRCP(U.K)

## 2019-04-25 LAB — HEPATITIS B CORE ANTIBODY, TOTAL: Hep B Core Total Ab: POSITIVE — AB

## 2019-04-25 LAB — HEPATITIS A ANTIBODY, TOTAL: hep A Total Ab: NEGATIVE

## 2019-04-25 LAB — HEPATITIS C GENOTYPE

## 2019-04-25 LAB — HEPATITIS B E ANTIBODY: Hep B E Ab: NEGATIVE

## 2019-04-25 LAB — HEPATITIS B SURFACE ANTIBODY,QUALITATIVE: Hep B Surface Ab, Qual: NONREACTIVE

## 2019-04-25 LAB — HEPATITIS B E ANTIGEN: Hep B E Ag: NEGATIVE

## 2019-05-10 ENCOUNTER — Encounter: Payer: Self-pay | Admitting: Gastroenterology

## 2019-05-12 ENCOUNTER — Telehealth: Payer: Self-pay

## 2019-05-12 ENCOUNTER — Other Ambulatory Visit: Payer: Self-pay

## 2019-05-12 DIAGNOSIS — R768 Other specified abnormal immunological findings in serum: Secondary | ICD-10-CM

## 2019-05-12 NOTE — Telephone Encounter (Signed)
-----   Message from Jonathon Bellows, MD sent at 05/10/2019  7:04 PM EDT ----- Sherald Hess inform  1. Hep C test was ordered not done need to get it done 2. He has been infected with hep B as well in the past but his immune system has cleared it and he does not have it at present.  3. Wil; discuss Hep C Rx once we have viral load in office  C./c Venita Lick, NP   Dr Jonathon Bellows MD,MRCP [facility]) Gastroenterology/Hepatology Pager: [phone]

## 2019-05-12 NOTE — Telephone Encounter (Signed)
Spoke with pt and informed him of results and Dr. Georgeann Oppenheim instructions. Pt agrees to visit our office lab this week.

## 2019-05-14 LAB — HCV RNA QUANT
HCV log10: 5.531 log10 IU/mL
Hepatitis C Quantitation: 340000 IU/mL

## 2019-05-19 ENCOUNTER — Telehealth: Payer: Self-pay | Admitting: Gastroenterology

## 2019-05-19 ENCOUNTER — Telehealth: Payer: Self-pay

## 2019-05-19 NOTE — Telephone Encounter (Signed)
Called and left a message for call back to informed patient of result.

## 2019-05-19 NOTE — Telephone Encounter (Signed)
Pt left vm returning  A call

## 2019-05-19 NOTE — Telephone Encounter (Signed)
-----   Message from Jonathon Bellows, MD sent at 05/15/2019  9:52 AM EDT ----- Inform has hepatitis C positive viral load- order liver elastography and then will need visit to discuss treatnment

## 2019-05-19 NOTE — Telephone Encounter (Signed)
Called and left a message for call back  

## 2019-05-25 ENCOUNTER — Other Ambulatory Visit: Payer: Self-pay | Admitting: Nurse Practitioner

## 2019-05-25 ENCOUNTER — Telehealth: Payer: Self-pay | Admitting: Nurse Practitioner

## 2019-05-25 MED ORDER — SILDENAFIL CITRATE 20 MG PO TABS: 20.0000 mg | ORAL_TABLET | ORAL | 1 refills | Status: DC | PRN

## 2019-05-25 MED ORDER — PROAIR HFA 108 (90 BASE) MCG/ACT IN AERS: 2.0000 | INHALATION_SPRAY | RESPIRATORY_TRACT | 4 refills | Status: DC | PRN

## 2019-05-25 MED ORDER — SYMBICORT 160-4.5 MCG/ACT IN AERO: 2.0000 | INHALATION_SPRAY | Freq: Two times a day (BID) | RESPIRATORY_TRACT | 5 refills | Status: DC

## 2019-05-25 NOTE — Telephone Encounter (Signed)
Called Pt, Pt understood.

## 2019-05-25 NOTE — Telephone Encounter (Signed)
Pt. Is here requesting for symbicort refills and sildenaifil and pro air refills please advise.

## 2019-05-25 NOTE — Telephone Encounter (Signed)
Refills sent in

## 2019-05-27 NOTE — Telephone Encounter (Signed)
Patient called back and verbalized understanding.

## 2019-06-24 ENCOUNTER — Encounter: Payer: Self-pay | Admitting: Gastroenterology

## 2019-06-24 ENCOUNTER — Encounter (INDEPENDENT_AMBULATORY_CARE_PROVIDER_SITE_OTHER): Payer: Self-pay

## 2019-06-24 ENCOUNTER — Ambulatory Visit: Payer: Medicare Other | Admitting: Gastroenterology

## 2019-06-24 ENCOUNTER — Other Ambulatory Visit: Payer: Self-pay

## 2019-06-24 VITALS — BP 179/84 | HR 71 | Temp 98.0°F | Ht 67.0 in | Wt 143.4 lb

## 2019-06-24 DIAGNOSIS — R768 Other specified abnormal immunological findings in serum: Secondary | ICD-10-CM | POA: Diagnosis not present

## 2019-06-24 DIAGNOSIS — B182 Chronic viral hepatitis C: Secondary | ICD-10-CM | POA: Diagnosis not present
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
## 2019-06-25 ENCOUNTER — Telehealth: Payer: Self-pay | Admitting: Nurse Practitioner

## 2019-06-25 LAB — HEPATITIS B SURFACE ANTIGEN: Hepatitis B Surface Ag: NEGATIVE

## 2019-06-25 NOTE — Telephone Encounter (Signed)
Pt presented in office stating that he had an appt yesterday and is was told that he needed to contact his pcp in regards to getting medicine. He states that he was told that his provider would be able to see the office note. Please advise.

## 2019-06-25 NOTE — Telephone Encounter (Signed)
Please let Andrew Potts know I see office note and have read through it, but see no mention of medicine I need to prescribe.  I did see where it says treatment regimen will be decided on after all testing with GI is complete, this is treatment they will order.  Thank you.

## 2019-06-26 NOTE — Telephone Encounter (Signed)
Called pt, no answer, left vm °

## 2019-06-26 NOTE — Telephone Encounter (Signed)
Thank you.  Please alert me if he calls back.

## 2019-07-01 ENCOUNTER — Ambulatory Visit: Payer: Medicare Other

## 2019-07-20 ENCOUNTER — Telehealth: Payer: Self-pay | Admitting: Gastroenterology

## 2019-07-20 NOTE — Telephone Encounter (Signed)
Pt left vm to  Cancel his apt for 07/21/18  [facility] please [facility]

## 2019-07-20 NOTE — Telephone Encounter (Signed)
Spoke with pt regarding his request to cancel ultrasound appointment. Pt states he was unable to get time off work and he'd also prefer postpone the U/S until April due to rising COVID cases. Pt states he'd like to wait until he receives the COVID vaccine.

## 2019-07-22 ENCOUNTER — Ambulatory Visit: Payer: Medicare Other

## 2019-07-30 ENCOUNTER — Other Ambulatory Visit: Payer: Self-pay | Admitting: Nurse Practitioner

## 2019-07-30 ENCOUNTER — Telehealth: Payer: Self-pay | Admitting: Nurse Practitioner

## 2019-07-30 MED ORDER — SILDENAFIL CITRATE 20 MG PO TABS: 20.0000 mg | ORAL_TABLET | ORAL | 6 refills | Status: DC | PRN

## 2019-07-30 NOTE — Telephone Encounter (Signed)
Refills sent with extra refills

## 2019-07-30 NOTE — Telephone Encounter (Signed)
Patient is here stating needs a refills of sildenafil medication and is asking for more refills so he wont  have to come in the office .

## 2019-07-31 NOTE — Telephone Encounter (Signed)
Pt understood and verbalized understanding.  

## 2019-09-16 ENCOUNTER — Encounter: Payer: Self-pay | Admitting: Nurse Practitioner

## 2019-09-16 ENCOUNTER — Ambulatory Visit (INDEPENDENT_AMBULATORY_CARE_PROVIDER_SITE_OTHER): Payer: Medicare Other | Admitting: Nurse Practitioner

## 2019-09-16 ENCOUNTER — Other Ambulatory Visit: Payer: Self-pay

## 2019-09-16 VITALS — BP 129/68 | HR 84 | Temp 98.4°F

## 2019-09-16 DIAGNOSIS — K732 Chronic active hepatitis, not elsewhere classified: Secondary | ICD-10-CM

## 2019-09-16 DIAGNOSIS — N529 Male erectile dysfunction, unspecified: Secondary | ICD-10-CM | POA: Diagnosis not present

## 2019-09-16 DIAGNOSIS — E7800 Pure hypercholesterolemia, unspecified: Secondary | ICD-10-CM | POA: Diagnosis not present

## 2019-09-16 DIAGNOSIS — J449 Chronic obstructive pulmonary disease, unspecified: Secondary | ICD-10-CM

## 2019-09-16 DIAGNOSIS — F17219 Nicotine dependence, cigarettes, with unspecified nicotine-induced disorders: Secondary | ICD-10-CM

## 2019-09-16 NOTE — Progress Notes (Signed)
BP 129/68   Pulse 84   Temp 98.4 F (36.9 C) (Oral)   SpO2 98%    Subjective:    Patient ID: Andrew Potts, male    DOB: 06-Mar-1951, 69 y.o.   MRN: [id number]  HPI: Andrew Potts is a 69 y.o. male  Chief Complaint  Patient presents with  . COPD   COPD Continues on Symbicort daily + Proair.  Smokes a 1/2 PPD, is not interested in quitting. FEV1 76% -- August 2020 + FEV1/FVC 90%. COPD status: stable Satisfied with current treatment?: yes Oxygen use: no Dyspnea frequency: minimal  Cough frequency: none Rescue inhaler frequency:  3-4 times a day Limitation of activity: no Productive cough: none Last Spirometry: August 2020 Pneumovax: Up to Date Influenza: Up to Date   HEPATITIS C Seen by GI on 06/24/2019.  No past drug use or tattoos.  Did have blood transfusion in 1973.  No excess alcohol use at home.  Plan is for liver elastography --treatment to be determined after this.  At this time he has postponed imaging due to Covid cases and wishes to wait until after he has Covid vaccine. Duration since diagnosis: 2020 Hep C transmission: possibly via blood transfusion in 1973 Genotype: 1a Viral load:  340,000 international units/mL. Hepatology evaluation:yes Liver biopsy:no  Cirrhosis: no Antiviral therapy:not yet Hepatocellular carcinoma screening: no Esophageal varices screening/EGD: no Hepatitis A Vaccine: Not up to Date Hepatitis B Vaccine: unknown Pneumovax Vaccine: Up to Date    ERECTILE DYSFUNCTION: Continues use of Sildenafil 20 MG as needed.  No issues reported with this.  HYPERLIPIDEMIA Last LDL was 128 -- no current medications. Hyperlipidemia status: good compliance Satisfied with current treatment?  yes Side effects:  no Medication compliance: good compliance Past cholesterol meds: none Supplements: none Aspirin:  no The 10-year ASCVD risk score Denman George DC Jr., et al., 2013) is: 18.1%   Values used to calculate the score:     Age: 28 years  Sex: Male     Is Non-Hispanic African American: No     Diabetic: No     Tobacco smoker: Yes     Systolic Blood Pressure: 129 mmHg     Is BP treated: No     HDL Cholesterol: 51 mg/dL     Total Cholesterol: 196 mg/dL Chest pain:  no Coronary artery disease:  no Family history CAD:  no Family history early CAD:  no  Relevant past medical, surgical, family and social history reviewed and updated as indicated. Interim medical history since our last visit reviewed. Allergies and medications reviewed and updated.  Review of Systems  Constitutional: Negative for activity change, diaphoresis, fatigue and fever.  Respiratory: Negative for cough, chest tightness, shortness of breath and wheezing.   Cardiovascular: Negative for chest pain, palpitations and leg swelling.  Gastrointestinal: Negative for abdominal distention, abdominal pain, constipation, diarrhea, nausea and vomiting.  Endocrine: Negative for cold intolerance, heat intolerance, polydipsia, polyphagia and polyuria.  Musculoskeletal: Negative.   Skin: Negative.   Neurological: Negative for dizziness, syncope, weakness, light-headedness, numbness and headaches.  Psychiatric/Behavioral: Negative.     Per HPI unless specifically indicated above     Objective:    BP 129/68   Pulse 84   Temp 98.4 F (36.9 C) (Oral)   SpO2 98%   Wt Readings from Last 3 Encounters:  06/24/19 143 lb 6.4 oz (65 kg)  04/22/19 142 lb 3.2 oz (64.5 kg)  03/10/19 138 lb (62.6 kg)    Physical Exam Vitals  and nursing note reviewed.  Constitutional:      General: He is awake. He is not in acute distress.    Appearance: He is well-developed and well-groomed. He is not ill-appearing.  HENT:     Head: Normocephalic and atraumatic.     Right Ear: Hearing normal. No drainage.     Left Ear: Hearing normal. No drainage.  Eyes:     General: Lids are normal.        Right eye: No discharge.        Left eye: No discharge.     Conjunctiva/sclera:  Conjunctivae normal.     Pupils: Pupils are equal, round, and reactive to light.  Neck:     Thyroid: No thyromegaly.     Vascular: No carotid bruit.  Cardiovascular:     Rate and Rhythm: Normal rate and regular rhythm.     Heart sounds: Normal heart sounds, S1 normal and S2 normal. No murmur. No gallop.   Pulmonary:     Effort: Pulmonary effort is normal. No accessory muscle usage or respiratory distress.     Breath sounds: Normal breath sounds.  Abdominal:     General: Bowel sounds are normal.     Palpations: Abdomen is soft.  Musculoskeletal:        General: Normal range of motion.     Cervical back: Normal range of motion and neck supple.     Right lower leg: No edema.     Left lower leg: No edema.  Skin:    General: Skin is warm and dry.     Capillary Refill: Capillary refill takes less than 2 seconds.  Neurological:     Mental Status: He is alert and oriented to person, place, and time.  Psychiatric:        Attention and Perception: Attention normal.        Mood and Affect: Mood normal.        Speech: Speech normal.        Behavior: Behavior normal. Behavior is cooperative.     Results for orders placed or performed in visit on 06/24/19  Hepatitis B Surface AntiGEN  Result Value Ref Range   Hepatitis B Surface Ag Negative Negative      Assessment & Plan:   Problem List Items Addressed This Visit      Respiratory   COPD (chronic obstructive pulmonary disease) (HCC) - Primary    Chronic, stable.  Spirometry FEV1/FVC 90% and FEV1 76%.   Recommend complete smoking cessation to slow down progression of disease process.  Continue current medication regimen, discussed possible change to Anoro vs Symbicort but it is too costly at this time, consider for future and consider CCM referral in future.  He refuses Lung CA Screening CT at this time due to Covid.  Return in 6 months for follow-up for annual physical.        Digestive   Chronic active hepatitis (HCC)    Ongoing,  wishes to hold off on imaging needed until receives Covid vaccines.  He will follow-up with GI after this.  Discussed at length with him.  Continue collaboration with GI, Dr. Tobi Bastos.        Nervous and Auditory   Nicotine dependence, cigarettes, w unsp disorders    I have recommended complete cessation of tobacco use. I have discussed various options available for assistance with tobacco cessation including over the counter methods (Nicotine gum, patch and lozenges). We also discussed prescription options (Chantix, Nicotine Inhaler /  Nasal Spray). The patient is not interested in pursuing any prescription tobacco cessation options at this time.         Other   Erectile dysfunction    Chronic, ongoing.  Continue Sildenafil as needed.        Elevated LDL cholesterol level    Noted on recent labs.  ASCVD 18.1%, discussed benefit of statin therapy with him and recommended.  He wishes to recheck lipid panel, fasting today, and will consider after results return.      Relevant Orders   Lipid Panel w/o Chol/HDL Ratio   Comprehensive metabolic panel       Follow up plan: Return in about 6 months (around 03/18/2020) for Annual physical.

## 2019-09-16 NOTE — Assessment & Plan Note (Signed)
Chronic, ongoing.  Continue Sildenafil as needed.   °

## 2019-09-16 NOTE — Assessment & Plan Note (Signed)
Ongoing, wishes to hold off on imaging needed until receives Covid vaccines.  He will follow-up with GI after this.  Discussed at length with him.  Continue collaboration with GI, Dr. Tobi Bastos.

## 2019-09-16 NOTE — Assessment & Plan Note (Signed)
Noted on recent labs.  ASCVD 18.1%, discussed benefit of statin therapy with him and recommended.  He wishes to recheck lipid panel, fasting today, and will consider after results return.

## 2019-09-16 NOTE — Assessment & Plan Note (Signed)
Chronic, stable.  Spirometry FEV1/FVC 90% and FEV1 76%.   Recommend complete smoking cessation to slow down progression of disease process.  Continue current medication regimen, discussed possible change to Anoro vs Symbicort but it is too costly at this time, consider for future and consider CCM referral in future.  He refuses Lung CA Screening CT at this time due to Covid.  Return in 6 months for follow-up for annual physical.

## 2019-09-16 NOTE — Assessment & Plan Note (Signed)
I have recommended complete cessation of tobacco use. I have discussed various options available for assistance with tobacco cessation including over the counter methods (Nicotine gum, patch and lozenges). We also discussed prescription options (Chantix, Nicotine Inhaler / Nasal Spray). The patient is not interested in pursuing any prescription tobacco cessation options at this time.  

## 2019-09-16 NOTE — Patient Instructions (Signed)

## 2019-09-17 LAB — COMPREHENSIVE METABOLIC PANEL
ALT: 26 IU/L (ref 0–44)
AST: 25 IU/L (ref 0–40)
Albumin/Globulin Ratio: 1.6 (ref 1.2–2.2)
Albumin: 4.5 g/dL (ref 3.8–4.8)
Alkaline Phosphatase: 73 IU/L (ref 39–117)
BUN/Creatinine Ratio: 16 (ref 10–24)
BUN: 12 mg/dL (ref 8–27)
Bilirubin Total: 0.4 mg/dL (ref 0.0–1.2)
CO2: 25 mmol/L (ref 20–29)
Calcium: 9.5 mg/dL (ref 8.6–10.2)
Chloride: 101 mmol/L (ref 96–106)
Creatinine, Ser: 0.75 mg/dL — ABNORMAL LOW (ref 0.76–1.27)
GFR calc Af Amer: 111 mL/min/{1.73_m2} (ref >59)
GFR calc non Af Amer: 96 mL/min/{1.73_m2} (ref >59)
Globulin, Total: 2.9 g/dL (ref 1.5–4.5)
Glucose: 76 mg/dL (ref 65–99)
Potassium: 4.5 mmol/L (ref 3.5–5.2)
Sodium: 142 mmol/L (ref 134–144)
Total Protein: 7.4 g/dL (ref 6.0–8.5)

## 2019-09-17 LAB — LIPID PANEL W/O CHOL/HDL RATIO
Cholesterol, Total: 179 mg/dL (ref 100–199)
HDL: 48 mg/dL (ref >39)
LDL Chol Calc (NIH): 103 mg/dL — ABNORMAL HIGH (ref 0–99)
Triglycerides: 158 mg/dL — ABNORMAL HIGH (ref 0–149)
VLDL Cholesterol Cal: 28 mg/dL (ref 5–40)

## 2019-09-17 NOTE — Progress Notes (Signed)
Good morning please let Mr. Andrew Potts know via phone or letter: Your labs have returned and kidney and liver function remain stable.  Cholesterol levels are elevated, with your smoking and higher LDL (the bad cholesterol) it would benefit you to take a statin daily.  With your Hep C we have to be cautious with this type of medication and your liver, but there is one that we can try.  It may help bring down your cholesterol levels and decrease your stroke risk.  If interested in trying let me know, if not continue focus on diet and we can talk more next visit.  Have a great day!!

## 2019-10-22 ENCOUNTER — Ambulatory Visit (INDEPENDENT_AMBULATORY_CARE_PROVIDER_SITE_OTHER): Payer: Medicare Other

## 2019-10-22 VITALS — Temp 97.0°F

## 2019-10-22 DIAGNOSIS — Z0000 Encounter for general adult medical examination without abnormal findings: Secondary | ICD-10-CM | POA: Diagnosis not present

## 2019-10-22 NOTE — Progress Notes (Signed)
Subjective:   Andrew Potts is a 69 y.o. male who presents for an Initial Medicare Annual Wellness Visit.  This visit is being conducted via phone call  - after an attmept to do on video chat - due to the COVID-19 pandemic. This patient has given me verbal consent via phone to conduct this visit, patient states they are participating from their home address. Some vital signs may be absent or patient reported.   Patient identification: identified by name, DOB, and current address.    Review of Systems        Objective:    Today's Vitals   10/22/19 1434  Temp: (!) 97 F (36.1 C)   There is no height or weight on file to calculate BMI.  Advanced Directives 10/22/2019  Does Patient Have a Medical Advance Directive? No    Current Medications (verified) Outpatient Encounter Medications as of 10/22/2019  Medication Sig  . PROAIR HFA 108 (90 Base) MCG/ACT inhaler Inhale 2 puffs into the lungs every 4 (four) hours as needed.  . sildenafil (REVATIO) 20 MG tablet Take 1 tablet (20 mg total) by mouth as needed. Daily only   No facility-administered encounter medications on file as of 10/22/2019.    Allergies (verified) Codeine, Codone [hydrocodone], and Levaquin [levofloxacin]   History: Past Medical History:  Diagnosis Date  . Arthritis   . COPD (chronic obstructive pulmonary disease) (HCC)    Past Surgical History:  Procedure Laterality Date  . ABDOMINAL EXPLORATION SURGERY     Family History  Problem Relation Age of Onset  . Alcohol abuse Father   . Alcohol abuse Brother   . Aneurysm Brother    Social History   Socioeconomic History  . Marital status: Divorced    Spouse name: Not on file  . Number of children: Not on file  . Years of education: Not on file  . Highest education level: Not on file  Occupational History  . Occupation: unload trucks    Comment: [employer]  Tobacco Use  . Smoking status: Current Every Day Smoker    Packs/day: 0.50    Years: 47.00    Pack years: 23.50  . Smokeless tobacco: Never Used  Substance and Sexual Activity  . Alcohol use: Not Currently  . Drug use: Never  . Sexual activity: Yes  Other Topics Concern  . Not on file  Social History Narrative  . Not on file   Social Determinants of Health   Financial Resource Strain: Low Risk   . Difficulty of Paying Living Expenses: Not hard at all  Food Insecurity: No Food Insecurity  . Worried About Programme researcher, broadcasting/film/video in the Last Year: Never true  . Ran Out of Food in the Last Year: Never true  Transportation Needs: No Transportation Needs  . Lack of Transportation (Medical): No  . Lack of Transportation (Non-Medical): No  Physical Activity: Sufficiently Active  . Days of Exercise per Week: 6 days  . Minutes of Exercise per Session: 30 min  Stress: No Stress Concern Present  . Feeling of Stress : Not at all  Social Connections: Unknown  . Frequency of Communication with Friends and Family: Three times a week  . Frequency of Social Gatherings with Friends and Family: Three times a week  . Attends Religious Services: Never  . Active Member of Clubs or Organizations: No  . Attends Banker Meetings: Never  . Marital Status: Not on file   Tobacco Counseling Ready to quit: Not  Answered Counseling given: Not Answered   Clinical Intake:  Pre-visit preparation completed: Yes  Pain : No/denies pain     Nutritional Risks: None Diabetes: No  How often do you need to have someone help you when you read instructions, pamphlets, or other written materials from your doctor or pharmacy?: 1 - Never  Interpreter Needed?: No  Information entered by :: Bhavya Eschete,LPN  Activities of Daily Living In your present state of health, do you have any difficulty performing the following activities: 10/22/2019 03/10/2019  Hearing? N N  Vision? N N  Comment eyeglasses, no eye dr -  Difficulty concentrating or making decisions? N N  Walking or climbing stairs? N  N  Dressing or bathing? N N  Doing errands, shopping? N N  Preparing Food and eating ? N -  Using the Toilet? N -  In the past six months, have you accidently leaked urine? N -  Do you have problems with loss of bowel control? N -  Managing your Medications? N -  Managing your Finances? N -  Housekeeping or managing your Housekeeping? N -  Some recent data might be hidden     Immunizations and Health Maintenance Immunization History  Administered Date(s) Administered  . Influenza, High Dose Seasonal PF 03/21/2019  . Influenza,inj,Quad PF,6+ Mos 04/07/2018  . PFIZER SARS-COV-2 Vaccination 10/07/2019  . Pneumococcal Conjugate-13 03/21/2019   There are no preventive care reminders to display for this patient.  Patient Care Team: Venita Lick, NP as PCP - General (Nurse Practitioner)  Indicate any recent Medical Services you may have received from other than [facility] providers in the past year (date may be approximate).    Assessment:   This is a routine wellness examination for [place].  Hearing/Vision screen No exam data present  Dietary issues and exercise activities discussed:    Goals Addressed   None    Depression Screen PHQ 2/9 Scores 10/22/2019 03/10/2019 02/04/2019  PHQ - 2 Score 0 0 0  PHQ- 9 Score - 0 0    Fall Risk Fall Risk  10/22/2019 03/10/2019 02/04/2019  Falls in the past year? 0 0 0  Number falls in past yr: 0 0 0  Injury with Fall? 0 0 0  Follow up - Falls evaluation completed Falls evaluation completed    FALL RISK PREVENTION PERTAINING TO THE HOME:  Any stairs in or around the home? Yes  If so, are there any without handrails? No   Home free of loose throw rugs in walkways, pet beds, electrical cords, etc? Yes  Adequate lighting in your home to reduce risk of falls? Yes   ASSISTIVE DEVICES UTILIZED TO PREVENT FALLS:  Life alert? No  Use of a cane, walker or w/c? No  Grab bars in the bathroom? No  Shower chair or bench in shower? No    Elevated toilet seat or a handicapped toilet? No    TIMED UP AND GO:  Unable to perform    Cognitive Function:     6CIT Screen 03/10/2019  What Year? 0 points  What month? 0 points  What time? 0 points  Count back from 20 0 points  Months in reverse 0 points  Repeat phrase 0 points  Total Score 0    Screening Tests Health Maintenance  Topic Date Due  . COLONOSCOPY  03/09/2020 (Originally 06/27/2003)  . TETANUS/TDAP  03/09/2020 (Originally 06/26/1972)  . INFLUENZA VACCINE  02/14/2020  . PNA vac Low Risk Adult (2 of 2 -  PPSV23) 03/20/2020  . Hepatitis C Screening  Completed    Qualifies for Shingles Vaccine? Yes  Zostavax completed n/a. Due for Shingrix. Education has been provided regarding the importance of this vaccine. Pt has been advised to call insurance company to determine out of pocket expense. Advised may also receive vaccine at local pharmacy or Health Dept. Verbalized acceptance and understanding.  Tdap: Discussed need for TD/TDAP vaccine, patient verbalized understanding that this is not covered as a preventative with there insurance and to call the office if he develops any new skin injuries, ie: cuts, scrapes, bug bites, or open wounds.  Flu Vaccine: up to date   Pneumococcal Vaccine: up to date   Covid-19 Vaccine:  Completed first dose, second dose schedule   Cancer Screenings:  Colorectal Screening: declined   Lung Cancer Screening: (Low Dose CT Chest recommended if Age 1-80 years, 30 pack-year currently smoking OR have quit w/in 15years.) does not qualify.     Additional Screening:  Hepatitis C Screening: does qualify; Completed 2020  Vision Screening: Recommended annual ophthalmology exams for early detection of glaucoma and other disorders of the eye. Is the patient up to date with their annual eye exam?  No   Dental Screening: Recommended annual dental exams for proper oral hygiene  Community Resource Referral:  CRR required this  visit?  No        Plan:  I have personally reviewed and addressed the Medicare Annual Wellness questionnaire and have noted the following in the patient's chart:  A. Medical and social history B. Use of alcohol, tobacco or illicit drugs  C. Current medications and supplements D. Functional ability and status E.  Nutritional status F.  Physical activity G. Advance directives H. List of other physicians I.  Hospitalizations, surgeries, and ER visits in previous 12 months J.  Vitals K. Screenings such as hearing and vision if needed, cognitive and depression L. Referrals and appointments   In addition, I have reviewed and discussed with patient certain preventive protocols, quality metrics, and best practice recommendations. A written personalized care plan for preventive services as well as general preventive health recommendations were provided to patient.   Signed,    Collene Schlichter, LPN   08/18/3005  Nurse Health Advisor   Nurse Notes: none

## 2019-10-22 NOTE — Patient Instructions (Signed)
Andrew Potts , Thank you for taking time to come for your Medicare Wellness Visit. I appreciate your ongoing commitment to your health goals. Please review the following plan we discussed and let me know if I can assist you in the future.   Screening recommendations/referrals: Colonoscopy: declined  Recommended yearly ophthalmology/optometry visit for glaucoma screening and checkup Recommended yearly dental visit for hygiene and checkup  Vaccinations: Influenza vaccine: up to date  Pneumococcal vaccine: up to date  Tdap vaccine: due now  Shingles vaccine: shingrix eligible   Covid-19: first dose completed   Advanced directives: Advance directive discussed with you today.  Once this is complete please bring a copy in to our office so we can scan it into your chart.  Conditions/risks identified: none  Next appointment: follow up in one year for your annual wellness visit   Preventive Care 67 Years and Older, Male Preventive care refers to lifestyle choices and visits with your health care provider that can promote health and wellness. What does preventive care include?  A yearly physical exam. This is also called an annual well check.  Dental exams once or twice a year.  Routine eye exams. Ask your health care provider how often you should have your eyes checked.  Personal lifestyle choices, including:  Daily care of your teeth and gums.  Regular physical activity.  Eating a healthy diet.  Avoiding tobacco and drug use.  Limiting alcohol use.  Practicing safe sex.  Taking low doses of aspirin every day.  Taking vitamin and mineral supplements as recommended by your health care provider. What happens during an annual well check? The services and screenings done by your health care provider during your annual well check will depend on your age, overall health, lifestyle risk factors, and family history of disease. Counseling  Your health care provider may ask you  questions about your:  Alcohol use.  Tobacco use.  Drug use.  Emotional well-being.  Home and relationship well-being.  Sexual activity.  Eating habits.  History of falls.  Memory and ability to understand (cognition).  Work and work Astronomer. Screening  You may have the following tests or measurements:  Height, weight, and BMI.  Blood pressure.  Lipid and cholesterol levels. These may be checked every 5 years, or more frequently if you are over 39 years old.  Skin check.  Lung cancer screening. You may have this screening every year starting at age 67 if you have a 30-pack-year history of smoking and currently smoke or have quit within the past 15 years.  Fecal occult blood test (FOBT) of the stool. You may have this test every year starting at age 67.  Flexible sigmoidoscopy or colonoscopy. You may have a sigmoidoscopy every 5 years or a colonoscopy every 10 years starting at age 67.  Prostate cancer screening. Recommendations will vary depending on your family history and other risks.  Hepatitis C blood test.  Hepatitis B blood test.  Sexually transmitted disease (STD) testing.  Diabetes screening. This is done by checking your blood sugar (glucose) after you have not eaten for a while (fasting). You may have this done every 1-3 years.  Abdominal aortic aneurysm (AAA) screening. You may need this if you are a current or former smoker.  Osteoporosis. You may be screened starting at age 38 if you are at high risk. Talk with your health care provider about your test results, treatment options, and if necessary, the need for more tests. Vaccines  Your health care  provider may recommend certain vaccines, such as:  Influenza vaccine. This is recommended every year.  Tetanus, diphtheria, and acellular pertussis (Tdap, Td) vaccine. You may need a Td booster every 10 years.  Zoster vaccine. You may need this after age 67.  Pneumococcal 13-valent conjugate  (PCV13) vaccine. One dose is recommended after age 54.  Pneumococcal polysaccharide (PPSV23) vaccine. One dose is recommended after age 54. Talk to your health care provider about which screenings and vaccines you need and how often you need them. This information is not intended to replace advice given to you by your health care provider. Make sure you discuss any questions you have with your health care provider. Document Released: 07/29/2015 Document Revised: 03/21/2016 Document Reviewed: 05/03/2015 Elsevier Interactive Patient Education  2017 [employer] Prevention in the Home Falls can cause injuries. They can happen to people of all ages. There are many things you can do to make your home safe and to help prevent falls. What can I do on the outside of my home?  Regularly fix the edges of walkways and driveways and fix any cracks.  Remove anything that might make you trip as you walk through a door, such as a raised step or threshold.  Trim any bushes or trees on the path to your home.  Use bright outdoor lighting.  Clear any walking paths of anything that might make someone trip, such as rocks or tools.  Regularly check to see if handrails are loose or broken. Make sure that both sides of any steps have handrails.  Any raised decks and porches should have guardrails on the edges.  Have any leaves, snow, or ice cleared regularly.  Use sand or salt on walking paths during winter.  Clean up any spills in your garage right away. This includes oil or grease spills. What can I do in the bathroom?  Use night lights.  Install grab bars by the toilet and in the tub and shower. Do not use towel bars as grab bars.  Use non-skid mats or decals in the tub or shower.  If you need to sit down in the shower, use a plastic, non-slip stool.  Keep the floor dry. Clean up any water that spills on the floor as soon as it happens.  Remove soap buildup in the tub or shower  regularly.  Attach bath mats securely with double-sided non-slip rug tape.  Do not have throw rugs and other things on the floor that can make you trip. What can I do in the bedroom?  Use night lights.  Make sure that you have a light by your bed that is easy to reach.  Do not use any sheets or blankets that are too big for your bed. They should not hang down onto the floor.  Have a firm chair that has side arms. You can use this for support while you get dressed.  Do not have throw rugs and other things on the floor that can make you trip. What can I do in the kitchen?  Clean up any spills right away.  Avoid walking on wet floors.  Keep items that you use a lot in easy-to-reach places.  If you need to reach something above you, use a strong step stool that has a grab bar.  Keep electrical cords out of the way.  Do not use floor polish or wax that makes floors slippery. If you must use wax, use non-skid floor wax.  Do not have throw  rugs and other things on the floor that can make you trip. What can I do with my stairs?  Do not leave any items on the stairs.  Make sure that there are handrails on both sides of the stairs and use them. Fix handrails that are broken or loose. Make sure that handrails are as long as the stairways.  Check any carpeting to make sure that it is firmly attached to the stairs. Fix any carpet that is loose or worn.  Avoid having throw rugs at the top or bottom of the stairs. If you do have throw rugs, attach them to the floor with carpet tape.  Make sure that you have a light switch at the top of the stairs and the bottom of the stairs. If you do not have them, ask someone to add them for you. What else can I do to help prevent falls?  Wear shoes that:  Do not have high heels.  Have rubber bottoms.  Are comfortable and fit you well.  Are closed at the toe. Do not wear sandals.  If you use a stepladder:  Make sure that it is fully  opened. Do not climb a closed stepladder.  Make sure that both sides of the stepladder are locked into place.  Ask someone to hold it for you, if possible.  Clearly mark and make sure that you can see:  Any grab bars or handrails.  First and last steps.  Where the edge of each step is.  Use tools that help you move around (mobility aids) if they are needed. These include:  Canes.  Walkers.  Scooters.  Crutches.  Turn on the lights when you go into a dark area. Replace any light bulbs as soon as they burn out.  Set up your furniture so you have a clear path. Avoid moving your furniture around.  If any of your floors are uneven, fix them.  If there are any pets around you, be aware of where they are.  Review your medicines with your doctor. Some medicines can make you feel dizzy. This can increase your chance of falling. Ask your doctor what other things that you can do to help prevent falls. This information is not intended to replace advice given to you by your health care provider. Make sure you discuss any questions you have with your health care provider. Document Released: 04/28/2009 Document Revised: 12/08/2015 Document Reviewed: 08/06/2014 Elsevier Interactive Patient Education  2017 [employer].

## 2019-11-03 ENCOUNTER — Other Ambulatory Visit: Payer: Self-pay | Admitting: Nurse Practitioner

## 2019-11-03 NOTE — Telephone Encounter (Signed)
Requested medication (s) are due for refill today: no  Requested medication (s) are on the active medication list: no  Last refill:  Discontinued on 09/16/19. Not on current med list  Future visit scheduled: yes  Notes to clinic:  Requested medication not on current med list. Please review for refill    Requested Prescriptions  Pending Prescriptions Disp Refills   SYMBICORT 160-4.5 MCG/ACT inhaler [Pharmacy Med Name: SYMBICORT 160-4.5 MCG INHALER] 10.2 g 0    Sig: Inhale 2 puffs into the lungs 2 (two) times daily.      Pulmonology:  [employer] Passed - 11/03/2019  1:24 PM      Passed - Valid encounter within last 12 months    Recent Outpatient Visits           1 month ago Chronic obstructive pulmonary disease, unspecified COPD type (HCC)   [facility] [name], Jolene T, NP   7 months ago Chronic obstructive pulmonary disease, unspecified COPD type (HCC)   [facility] [name], Jolene T, NP   9 months ago Chronic obstructive pulmonary disease, unspecified COPD type (HCC)   [facility] [name], Dorie Rank, NP       Future Appointments             In 4 months [name], Dorie Rank, NP [facility]

## 2019-11-03 NOTE — Telephone Encounter (Signed)
Routing to provider  

## 2019-11-30 ENCOUNTER — Ambulatory Visit: Payer: Medicare Other | Admitting: Nurse Practitioner

## 2020-01-11 ENCOUNTER — Encounter: Payer: Self-pay | Admitting: Nurse Practitioner

## 2020-01-11 ENCOUNTER — Other Ambulatory Visit: Payer: Self-pay

## 2020-01-11 ENCOUNTER — Ambulatory Visit (INDEPENDENT_AMBULATORY_CARE_PROVIDER_SITE_OTHER): Payer: Medicare Other | Admitting: Nurse Practitioner

## 2020-01-11 DIAGNOSIS — R1032 Left lower quadrant pain: Secondary | ICD-10-CM | POA: Insufficient documentation

## 2020-01-11 MED ORDER — MELOXICAM 15 MG PO TABS: 15.0000 mg | ORAL_TABLET | Freq: Every day | ORAL | 0 refills | Status: DC

## 2020-01-11 NOTE — Patient Instructions (Signed)
Adductor Muscle Strain  An adductor muscle strain, also called a groin strain or pull, is an injury to the muscles or tendons on the upper, inner part of the thigh. These muscles are called the adductor muscles or groin muscles. They are responsible for moving the legs across the body or pulling the legs together. A muscle strain occurs when a muscle is overstretched and some muscle fibers are torn. An adductor muscle strain can range from mild to severe, depending on how many muscle fibers are affected and whether the muscle fibers are partially or completely torn. What are the causes? Adductor muscle strains usually occur during exercise or while participating in sports. The injury often happens when a sudden, violent force is placed on a muscle, stretching the muscle too far. A strain is more likely to happen when your muscles are not warmed up or if you are not properly conditioned. This injury may be caused by:  Stretching the adductor muscles too far or too suddenly, often during side-to-side motion with a sudden change in direction.  Putting repeated stress on the adductor muscles over a long period of time.  Performing vigorous activity without properly stretching the adductor muscles beforehand. What are the signs or symptoms? Symptoms of this condition include:  Pain and tenderness in the groin area. This begins as sharp pain and persists as a dull ache.  A popping or snapping feeling when the injury occurs (for severe strains).  Swelling or bruising.  Muscle spasms.  Weakness in the leg.  Stiffness in the groin area with decreased ability to move the affected muscles. How is this diagnosed? This condition may be diagnosed based on:  Your symptoms and a description of how the injury occurred.  A physical exam.  Imaging tests, such as: ? X-rays. These are sometimes needed to rule out a broken bone or cartilage problems. ? An ultrasound, CT scan, or MRI. These may be done  if your health care provider suspects a complete muscle tear or needs to check for other injuries. How is this treated? An adductor strain will often heal on its own. If needed, this condition may be treated with:  PRICE therapy. PRICE stands for protection of the injured area, rest, ice, pressure (compression), and elevation.  Medicines to help manage pain and swelling (anti-inflammatory medicines).  Crutches. You may be directed to use these for the first few days to minimize your pain. Depending on the severity of the muscle strain, recovery time may vary from a few weeks to several months. Severe injuries often require 4-6 weeks for recovery. In those cases, complete healing can take 4-5 months. Follow these instructions at home: PRICE Therapy   Protect the muscle from being injured again.  Rest. Do not use the strained muscle if it causes pain.  If directed, put ice on the injured area: ? Put ice in a plastic bag. ? Place a towel between your skin and the bag. ? Leave the ice on for 20 minutes, 2-3 times a day. Do this for the first 2 days after the injury.  Apply compression by wrapping the injured area with an elastic bandage as told by your health care provider.  Raise (elevate) the injured area above the level of your heart while you are sitting or lying down. General instructions  Take over-the-counter and prescription medicines only as told by your health care provider.  Walk, stretch, and do exercises as told by your health care provider. Only do these activities if   you can do so without any pain.  Follow your treatment plan as told by your health care provider. This may include: ? Physical therapy. ? Massage. ? Local electrical stimulation (transcutaneous electrical nerve stimulation, TENS). How is this prevented?  Warm up and stretch before being active.  Cool down and stretch after being active.  Give your body time to rest between periods of activity.  Make  sure to use equipment that fits you.  Be safe and responsible while being active to avoid slips and falls.  Maintain physical fitness, including: ? Proper conditioning in the adductor muscles. ? Overall strength, flexibility, and endurance. Contact a health care provider if:  You have increased pain or swelling in the affected area.  Your symptoms are not improving or they are getting worse. Summary  An adductor muscle strain, also called a groin strain or pull, is an injury to the muscles or tendons on the upper, inner part of the thigh.  A muscle strain occurs when a muscle is overstretched and some muscle fibers are torn.  Depending on the severity of the muscle strain, recovery time may vary from a few weeks to several months. This information is not intended to replace advice given to you by your health care provider. Make sure you discuss any questions you have with your health care provider. Document Revised: 10/21/2018 Document Reviewed: 12/02/2017 Elsevier Patient Education  2020 Else[employer] Inc.  

## 2020-01-11 NOTE — Progress Notes (Signed)
BP 125/72   Pulse 82   Temp 98.2 F (36.8 C) (Oral)   Wt 145 lb 3.2 oz (65.9 kg)   SpO2 96%   BMI 22.74 kg/m    Subjective:    Patient ID: Andrew Potts, male    DOB: 1950-07-19, 69 y.o.   MRN: [id number]  HPI: Andrew Potts is a 69 y.o. male  Chief Complaint  Patient presents with  . Pain    pt states he has been haing pain in his groin for the past week. States he thinks he oulled something at work last week while lifting containers.   GROIN PAIN: Left groin pain.  Was putting jugs in a box, was having to reach and pull them out, using left hand.  This was a little over a week ago.  Has been taking Ibuprofen with benefit, reports pain has been easing off the past 3 days.  Initially had to sit down to put underwear on, but this has improved, "it is going away".   Duration: weeks Mechanism of injury: lifting Location: Left Onset: sudden Severity: 8/10 at worst -- hurt down his leg initially to his knee, 1/10 at best Quality: sharp, dull and aching Frequency: intermittent Aggravating factors: walking at times Alleviating factors: Ibuprofen Status: better Treatments attempted: ibuprofen  Relief with NSAIDs?: moderate Nighttime pain:  no  Relevant past medical, surgical, family and social history reviewed and updated as indicated. Interim medical history since our last visit reviewed. Allergies and medications reviewed and updated.  Review of Systems  Constitutional: Negative for activity change, diaphoresis, fatigue and fever.  Respiratory: Negative for cough, chest tightness, shortness of breath and wheezing.   Cardiovascular: Negative for chest pain, palpitations and leg swelling.  Gastrointestinal: Negative.   Neurological: Negative.   Psychiatric/Behavioral: Negative.     Per HPI unless specifically indicated above     Objective:    BP 125/72   Pulse 82   Temp 98.2 F (36.8 C) (Oral)   Wt 145 lb 3.2 oz (65.9 kg)   SpO2 96%   BMI 22.74 kg/m   Wt  Readings from Last 3 Encounters:  01/11/20 145 lb 3.2 oz (65.9 kg)  06/24/19 143 lb 6.4 oz (65 kg)  04/22/19 142 lb 3.2 oz (64.5 kg)    Physical Exam Vitals and nursing note reviewed.  Constitutional:      General: He is awake. He is not in acute distress.    Appearance: He is well-developed and well-groomed. He is not ill-appearing.  HENT:     Head: Normocephalic and atraumatic.     Right Ear: Hearing normal. No drainage.     Left Ear: Hearing normal. No drainage.     Mouth/Throat:     Pharynx: Uvula midline.  Eyes:     General: Lids are normal.        Right eye: No discharge.        Left eye: No discharge.     Conjunctiva/sclera: Conjunctivae normal.     Pupils: Pupils are equal, round, and reactive to light.  Neck:     Trachea: Trachea normal.  Cardiovascular:     Rate and Rhythm: Normal rate and regular rhythm.     Heart sounds: Normal heart sounds, S1 normal and S2 normal. No murmur heard.  No gallop.   Pulmonary:     Effort: Pulmonary effort is normal. No accessory muscle usage or respiratory distress.     Breath sounds: Normal breath sounds.  Abdominal:  General: Bowel sounds are normal.     Palpations: Abdomen is soft.     Hernia: There is no hernia in the left inguinal area or right inguinal area.  Genitourinary:    Penis: Normal.      Testes: Normal.     Epididymis:     Right: Normal.     Left: Normal.     Comments: Refused chaperone.  No hernia palpate and no tenderness on exam. Musculoskeletal:        General: Normal range of motion.     Cervical back: Normal range of motion and neck supple.     Right lower leg: No edema.     Left lower leg: No edema.  Skin:    General: Skin is warm and dry.     Capillary Refill: Capillary refill takes less than 2 seconds.  Neurological:     Mental Status: He is alert and oriented to person, place, and time.  Psychiatric:        Attention and Perception: Attention normal.        Mood and Affect: Mood normal.         Speech: Speech normal.        Behavior: Behavior normal. Behavior is cooperative.        Thought Content: Thought content normal.     Results for orders placed or performed in visit on 09/16/19  Lipid Panel w/o Chol/HDL Ratio  Result Value Ref Range   Cholesterol, Total 179 100 - 199 mg/dL   Triglycerides 001 (H) 0 - 149 mg/dL   HDL 48 >74 mg/dL   VLDL Cholesterol Cal 28 5 - 40 mg/dL   LDL Chol Calc (NIH) 944 (H) 0 - 99 mg/dL  Comprehensive metabolic panel  Result Value Ref Range   Glucose 76 65 - 99 mg/dL   BUN 12 8 - 27 mg/dL   Creatinine, Ser 9.67 (L) 0.76 - 1.27 mg/dL   GFR calc non Af Amer 96 >59 mL/min/1.73   GFR calc Af Amer 111 >59 mL/min/1.73   BUN/Creatinine Ratio 16 10 - 24   Sodium 142 134 - 144 mmol/L   Potassium 4.5 3.5 - 5.2 mmol/L   Chloride 101 96 - 106 mmol/L   CO2 25 20 - 29 mmol/L   Calcium 9.5 8.6 - 10.2 mg/dL   Total Protein 7.4 6.0 - 8.5 g/dL   Albumin 4.5 3.8 - 4.8 g/dL   Globulin, Total 2.9 1.5 - 4.5 g/dL   Albumin/Globulin Ratio 1.6 1.2 - 2.2   Bilirubin Total 0.4 0.0 - 1.2 mg/dL   Alkaline Phosphatase 73 39 - 117 IU/L   AST 25 0 - 40 IU/L   ALT 26 0 - 44 IU/L      Assessment & Plan:   Problem List Items Addressed This Visit      Other   Groin pain, left    Acute and improving at this time.  No hernia palpated on exam and no tenderness.  Suspect muscle strain.  Recommend no heavy lifting over next week.  Script for Meloxicam sent and recommend wearing supportive underwear.  To return to office for any worsening symptoms, if worsening will obtain imaging.          Follow up plan: Return if symptoms worsen or fail to improve.

## 2020-01-11 NOTE — Assessment & Plan Note (Signed)
Acute and improving at this time.  No hernia palpated on exam and no tenderness.  Suspect muscle strain.  Recommend no heavy lifting over next week.  Script for Meloxicam sent and recommend wearing supportive underwear.  To return to office for any worsening symptoms, if worsening will obtain imaging.

## 2020-03-02 ENCOUNTER — Other Ambulatory Visit: Payer: Self-pay | Admitting: Nurse Practitioner

## 2020-03-30 ENCOUNTER — Encounter: Payer: Medicare Other | Admitting: Nurse Practitioner

## 2020-04-28 ENCOUNTER — Other Ambulatory Visit: Payer: Self-pay | Admitting: Nurse Practitioner

## 2020-04-28 MED ORDER — SILDENAFIL CITRATE 20 MG PO TABS: 20.0000 mg | ORAL_TABLET | ORAL | 0 refills | Status: DC | PRN

## 2020-04-28 MED ORDER — SYMBICORT 160-4.5 MCG/ACT IN AERO: 2.0000 | INHALATION_SPRAY | Freq: Two times a day (BID) | RESPIRATORY_TRACT | 5 refills | Status: DC

## 2020-04-28 NOTE — Telephone Encounter (Signed)
Medication: sildenafil (REVATIO) 20 MG tablet [[id number]] , YMBICORT 160-4.5 MCG/ACT inhaler [[id number]]   Has the patient contacted their pharmacy?YES (Agent: If no, request that the patient contact the pharmacy for the refill.) (Agent: If yes, when and what did the pharmacy advise?)  Preferred Pharmacy (with phone number or street name): [facility] - [place], [place] - [address] [place] [address] Phone: [phone] Fax: [phone] Hours: Not open 24 hours    Agent: Please be advised that RX refills may take up to 3 business days. We ask that you follow-up with your pharmacy.

## 2020-05-03 ENCOUNTER — Encounter: Payer: Self-pay | Admitting: Nurse Practitioner

## 2020-05-03 ENCOUNTER — Other Ambulatory Visit: Payer: Self-pay

## 2020-05-03 ENCOUNTER — Ambulatory Visit (INDEPENDENT_AMBULATORY_CARE_PROVIDER_SITE_OTHER): Payer: Medicare Other | Admitting: Nurse Practitioner

## 2020-05-03 VITALS — BP 128/78 | HR 93 | Temp 98.5°F | Resp 16 | Ht 67.0 in | Wt 152.2 lb

## 2020-05-03 DIAGNOSIS — R030 Elevated blood-pressure reading, without diagnosis of hypertension: Secondary | ICD-10-CM

## 2020-05-03 DIAGNOSIS — E7800 Pure hypercholesterolemia, unspecified: Secondary | ICD-10-CM

## 2020-05-03 DIAGNOSIS — J449 Chronic obstructive pulmonary disease, unspecified: Secondary | ICD-10-CM | POA: Diagnosis not present

## 2020-05-03 DIAGNOSIS — K732 Chronic active hepatitis, not elsewhere classified: Secondary | ICD-10-CM | POA: Diagnosis not present

## 2020-05-03 DIAGNOSIS — F17219 Nicotine dependence, cigarettes, with unspecified nicotine-induced disorders: Secondary | ICD-10-CM

## 2020-05-03 DIAGNOSIS — Z125 Encounter for screening for malignant neoplasm of prostate: Secondary | ICD-10-CM

## 2020-05-03 DIAGNOSIS — Z136 Encounter for screening for cardiovascular disorders: Secondary | ICD-10-CM

## 2020-05-03 DIAGNOSIS — Z0000 Encounter for general adult medical examination without abnormal findings: Secondary | ICD-10-CM

## 2020-05-03 DIAGNOSIS — N529 Male erectile dysfunction, unspecified: Secondary | ICD-10-CM

## 2020-05-03 MED ORDER — SILDENAFIL CITRATE 20 MG PO TABS: 20.0000 mg | ORAL_TABLET | ORAL | 12 refills | Status: DC | PRN

## 2020-05-03 NOTE — Assessment & Plan Note (Signed)
Ongoing.  Have highly recommended he schedule follow-up with GI to initiate treatment, discussed at length with him.  Continue collaboration with GI, Dr. Anna. 

## 2020-05-03 NOTE — Assessment & Plan Note (Signed)
Noted on recent labs.  ASCVD 17.4%, discussed benefit of statin therapy with him and recommended.  He wishes to recheck lipid panel, fasting today, and will consider after results return.

## 2020-05-03 NOTE — Assessment & Plan Note (Signed)
I have recommended complete cessation of tobacco use. I have discussed various options available for assistance with tobacco cessation including over the counter methods (Nicotine gum, patch and lozenges). We also discussed prescription options (Chantix, Nicotine Inhaler / Nasal Spray). The patient is not interested in pursuing any prescription tobacco cessation options at this time.  Recommended lung CT screening and provided pamphlet on this, he wishes to think about this.

## 2020-05-03 NOTE — Assessment & Plan Note (Signed)
Ongoing with initial BP elevated, but repeat improved at goal.  Recommend he monitor BP at home three mornings a week and document for provider, educated him on this.  Continue diet and exercise focus.  Labs today.  Return in 6 months for follow-up.

## 2020-05-03 NOTE — Progress Notes (Signed)
BP 128/78 (BP Location: Left Arm)   Pulse 93   Temp 98.5 F (36.9 C) (Oral)   Resp 16   Ht $R'5\' 7"'Oj$  (1.702 m)   Wt 152 lb 3.2 oz (69 kg)   SpO2 96%   BMI 23.84 kg/m    Subjective:    Patient ID: Andrew Potts, male    DOB: 03-29-1951, 69 y.o.   MRN: [id number]  HPI: Andrew Potts is a 68 y.o. male presenting on 05/03/2020 for comprehensive medical examination. Current medical complaints include:none  He currently lives with: self Interim Problems from his last visit: no   COPD Continues on Symbicort daily + Proair.  Smokes a 1/2 PPD, is not interested in quitting. FEV1 76% -- August 2020 + FEV1/FVC 90%. COPD status: stable Satisfied with current treatment?: yes Oxygen use: no Dyspnea frequency: minimal  Cough frequency: none Rescue inhaler frequency:  a few times a week Limitation of activity: no Productive cough: none Last Spirometry: August 2020 Pneumovax: Up to Date Influenza: Up to Date   HEPATITIS C Seen by GI on 06/24/2019.  No past drug use or tattoos.  Did have blood transfusion in 1973.  No excess alcohol use at home.  Plan is for liver elastography --treatment to be determined after this.  At this time he has postponed imaging due to Covid cases and he is concerned with cost of treatment.   Duration since diagnosis: 2020 Hep C transmission: possibly via blood transfusion in 1973 Genotype: 1a Viral load:  340,000 international units/mL. Hepatology evaluation:yes Liver biopsy:no  Cirrhosis: no Antiviral therapy:not yet Hepatocellular carcinoma screening: no Esophageal varices screening/EGD: no Hepatitis A Vaccine: Not up to Date Hepatitis B Vaccine: unknown Pneumovax Vaccine: Up to Date    ERECTILE DYSFUNCTION: Continues use of Sildenafil 20 MG as needed.  No issues reported with this.  HYPERLIPIDEMIA Last LDL was 103 -- no current medications, does not wish to start. Hyperlipidemia status: good compliance Satisfied with current treatment?   yes Side effects:  no Medication compliance: good compliance Past cholesterol meds: none Supplements: none Aspirin:  no The 10-year ASCVD risk score Mikey Bussing DC Jr., et al., 2013) is: 17.4%   Values used to calculate the score:     Age: 11 years     Sex: Male     Is Non-Hispanic African American: No     Diabetic: No     Tobacco smoker: Yes     Systolic Blood Pressure: 892 mmHg     Is BP treated: No     HDL Cholesterol: 48 mg/dL     Total Cholesterol: 179 mg/dL Chest pain:  no Coronary artery disease:  no Family history CAD:  no Family history early CAD:  no  Functional Status Survey: Is the patient deaf or have difficulty hearing?: No Does the patient have difficulty seeing, even when wearing glasses/contacts?: No Does the patient have difficulty concentrating, remembering, or making decisions?: No Does the patient have difficulty walking or climbing stairs?: No Does the patient have difficulty dressing or bathing?: No Does the patient have difficulty doing errands alone such as visiting a doctor's office or shopping?: No  FALL RISK: Fall Risk  10/22/2019 03/10/2019 02/04/2019  Falls in the past year? 0 0 0  Number falls in past yr: 0 0 0  Injury with Fall? 0 0 0  Follow up - Falls evaluation completed Falls evaluation completed    Depression Screen Depression screen [facility] 2/9 10/22/2019 03/10/2019 02/04/2019  Decreased Interest 0  0 0  Down, Depressed, Hopeless 0 0 0  PHQ - 2 Score 0 0 0  Altered sleeping - 0 0  Tired, decreased energy - 0 0  Change in appetite - 0 0  Feeling bad or failure about yourself  - 0 0  Trouble concentrating - 0 0  Moving slowly or fidgety/restless - 0 0  Suicidal thoughts - 0 0  PHQ-9 Score - 0 0  Difficult doing work/chores - Not difficult at all Not difficult at all    Advanced Directives <no information>  Past Medical History:  Past Medical History:  Diagnosis Date  . Arthritis   . COPD (chronic obstructive pulmonary disease) ([place])      Surgical History:  Past Surgical History:  Procedure Laterality Date  . ABDOMINAL EXPLORATION SURGERY      Medications:  Current Outpatient Medications on File Prior to Visit  Medication Sig  . meloxicam (MOBIC) 15 MG tablet Take 1 tablet (15 mg total) by mouth daily.  Marland Kitchen PROAIR HFA 108 (90 Base) MCG/ACT inhaler Inhale 2 puffs into the lungs every 4 (four) hours as needed.  . SYMBICORT 160-4.5 MCG/ACT inhaler Inhale 2 puffs into the lungs 2 (two) times daily.   No current facility-administered medications on file prior to visit.    Allergies:  Allergies  Allergen Reactions  . Codeine   . Codone [Hydrocodone] Other (See Comments)    Abdominal pain  . Levaquin [Levofloxacin] Swelling    Social History:  Social History   Socioeconomic History  . Marital status: Divorced    Spouse name: Not on file  . Number of children: Not on file  . Years of education: Not on file  . Highest education level: Not on file  Occupational History  . Occupation: unload trucks    Comment: [employer]  Tobacco Use  . Smoking status: Current Every Day Smoker    Packs/day: 0.50    Years: 47.00    Pack years: 23.50  . Smokeless tobacco: Never Used  Vaping Use  . Vaping Use: Never used  Substance and Sexual Activity  . Alcohol use: Not Currently  . Drug use: Never  . Sexual activity: Yes  Other Topics Concern  . Not on file  Social History Narrative  . Not on file   Social Determinants of Health   Financial Resource Strain:   . Difficulty of Paying Living Expenses: Not on file  Food Insecurity:   . Worried About Charity fundraiser in the Last Year: Not on file  . Ran Out of Food in the Last Year: Not on file  Transportation Needs:   . Lack of Transportation (Medical): Not on file  . Lack of Transportation (Non-Medical): Not on file  Physical Activity:   . Days of Exercise per Week: Not on file  . Minutes of Exercise per Session: Not on file  Stress:   . Feeling of Stress : Not  on file  Social Connections:   . Frequency of Communication with Friends and Family: Not on file  . Frequency of Social Gatherings with Friends and Family: Not on file  . Attends Religious Services: Not on file  . Active Member of Clubs or Organizations: Not on file  . Attends Archivist Meetings: Not on file  . Marital Status: Not on file  Intimate Partner Violence:   . Fear of Current or Ex-Partner: Not on file  . Emotionally Abused: Not on file  . Physically Abused: Not on file  .  Sexually Abused: Not on file   Social History   Tobacco Use  Smoking Status Current Every Day Smoker  . Packs/day: 0.50  . Years: 47.00  . Pack years: 23.50  Smokeless Tobacco Never Used   Social History   Substance and Sexual Activity  Alcohol Use Not Currently    Family History:  Family History  Problem Relation Age of Onset  . Alcohol abuse Father   . Alcohol abuse Brother   . Aneurysm Brother     Past medical history, surgical history, medications, allergies, family history and social history reviewed with patient today and changes made to appropriate areas of the chart.   Review of Systems - negative All other ROS negative except what is listed above and in the HPI.      Objective:    BP 128/78 (BP Location: Left Arm)   Pulse 93   Temp 98.5 F (36.9 C) (Oral)   Resp 16   Ht $R'5\' 7"'gY$  (1.702 m)   Wt 152 lb 3.2 oz (69 kg)   SpO2 96%   BMI 23.84 kg/m   Wt Readings from Last 3 Encounters:  05/03/20 152 lb 3.2 oz (69 kg)  01/11/20 145 lb 3.2 oz (65.9 kg)  06/24/19 143 lb 6.4 oz (65 kg)    Physical Exam Vitals and nursing note reviewed.  Constitutional:      General: He is awake. He is not in acute distress.    Appearance: He is well-developed and well-groomed. He is not ill-appearing.  HENT:     Head: Normocephalic and atraumatic.     Right Ear: Hearing, tympanic membrane, ear canal and external ear normal. No drainage.     Left Ear: Hearing, tympanic membrane,  ear canal and external ear normal. No drainage.     Nose: Nose normal.     Mouth/Throat:     Pharynx: Uvula midline.  Eyes:     General: Lids are normal.        Right eye: No discharge.        Left eye: No discharge.     Extraocular Movements: Extraocular movements intact.     Conjunctiva/sclera: Conjunctivae normal.     Pupils: Pupils are equal, round, and reactive to light.     Visual Fields: Right eye visual fields normal and left eye visual fields normal.  Neck:     Thyroid: No thyromegaly.     Vascular: No carotid bruit or JVD.     Trachea: Trachea normal.  Cardiovascular:     Rate and Rhythm: Normal rate and regular rhythm.     Heart sounds: Normal heart sounds, S1 normal and S2 normal. No murmur heard.  No gallop.   Pulmonary:     Effort: Pulmonary effort is normal. No accessory muscle usage or respiratory distress.     Breath sounds: Normal breath sounds.  Abdominal:     General: Bowel sounds are normal.     Palpations: Abdomen is soft. There is no hepatomegaly or splenomegaly.     Tenderness: There is no abdominal tenderness.  Musculoskeletal:        General: Normal range of motion.     Cervical back: Normal range of motion and neck supple.     Right lower leg: No edema.     Left lower leg: No edema.  Lymphadenopathy:     Head:     Right side of head: No submental, submandibular, tonsillar, preauricular or posterior auricular adenopathy.     Left side  of head: No submental, submandibular, tonsillar, preauricular or posterior auricular adenopathy.     Cervical: No cervical adenopathy.  Skin:    General: Skin is warm and dry.     Capillary Refill: Capillary refill takes less than 2 seconds.     Findings: No rash.  Neurological:     Mental Status: He is alert and oriented to person, place, and time.     Cranial Nerves: Cranial nerves are intact.     Gait: Gait is intact.     Deep Tendon Reflexes: Reflexes are normal and symmetric.     Reflex Scores:       Brachioradialis reflexes are 2+ on the right side and 2+ on the left side.      Patellar reflexes are 2+ on the right side and 2+ on the left side. Psychiatric:        Attention and Perception: Attention normal.        Mood and Affect: Mood normal.        Speech: Speech normal.        Behavior: Behavior normal. Behavior is cooperative.        Thought Content: Thought content normal.        Cognition and Memory: Cognition normal.        Judgment: Judgment normal.    Results for orders placed or performed in visit on 09/16/19  Lipid Panel w/o Chol/HDL Ratio  Result Value Ref Range   Cholesterol, Total 179 100 - 199 mg/dL   Triglycerides 158 (H) 0 - 149 mg/dL   HDL 48 >39 mg/dL   VLDL Cholesterol Cal 28 5 - 40 mg/dL   LDL Chol Calc (NIH) 103 (H) 0 - 99 mg/dL  Comprehensive metabolic panel  Result Value Ref Range   Glucose 76 65 - 99 mg/dL   BUN 12 8 - 27 mg/dL   Creatinine, Ser 0.75 (L) 0.76 - 1.27 mg/dL   GFR calc non Af Amer 96 >59 mL/min/1.73   GFR calc Af Amer 111 >59 mL/min/1.73   BUN/Creatinine Ratio 16 10 - 24   Sodium 142 134 - 144 mmol/L   Potassium 4.5 3.5 - 5.2 mmol/L   Chloride 101 96 - 106 mmol/L   CO2 25 20 - 29 mmol/L   Calcium 9.5 8.6 - 10.2 mg/dL   Total Protein 7.4 6.0 - 8.5 g/dL   Albumin 4.5 3.8 - 4.8 g/dL   Globulin, Total 2.9 1.5 - 4.5 g/dL   Albumin/Globulin Ratio 1.6 1.2 - 2.2   Bilirubin Total 0.4 0.0 - 1.2 mg/dL   Alkaline Phosphatase 73 39 - 117 IU/L   AST 25 0 - 40 IU/L   ALT 26 0 - 44 IU/L      Assessment & Plan:   Problem List Items Addressed This Visit      Cardiovascular and Mediastinum   White coat syndrome without diagnosis of hypertension    Ongoing with initial BP elevated, but repeat improved at goal.  Recommend he monitor BP at home three mornings a week and document for provider, educated him on this.  Continue diet and exercise focus.  Labs today.  Return in 6 months for follow-up.      Relevant Medications   sildenafil  (REVATIO) 20 MG tablet     Respiratory   COPD (chronic obstructive pulmonary disease) (HCC)    Chronic, stable.  Spirometry FEV1/FVC 90% and FEV1 76% in 2020.   Recommend complete smoking cessation to slow down progression of disease  process.  Continue current medication regimen, discussed possible change to Anoro vs Symbicort but it is too costly at this time, consider for future and consider CCM referral in future.  He refuses Lung CA Screening CT at this time due to Covid, recommended he obtain this.  Return in 6 months for follow-up and repeat spirometry.      Relevant Orders   CBC with Differential/Platelet   Comprehensive metabolic panel   TSH     Digestive   Chronic active hepatitis ([place])    Ongoing.  Have highly recommended he schedule follow-up with GI to initiate treatment, discussed at length with him.  Continue collaboration with GI, Dr. Vicente Males.      Relevant Orders   CBC with Differential/Platelet   Comprehensive metabolic panel   TSH     Nervous and Auditory   Nicotine dependence, cigarettes, w unsp disorders    I have recommended complete cessation of tobacco use. I have discussed various options available for assistance with tobacco cessation including over the counter methods (Nicotine gum, patch and lozenges). We also discussed prescription options (Chantix, Nicotine Inhaler / Nasal Spray). The patient is not interested in pursuing any prescription tobacco cessation options at this time.  Recommended lung CT screening and provided pamphlet on this, he wishes to think about this.         Other   Erectile dysfunction    Chronic, ongoing.  Continue Sildenafil as needed.        Elevated LDL cholesterol level    Noted on recent labs.  ASCVD 17.4%, discussed benefit of statin therapy with him and recommended.  He wishes to recheck lipid panel, fasting today, and will consider after results return.      Relevant Orders   Lipid Panel w/o Chol/HDL Ratio    Other Visit  Diagnoses    Routine general medical examination at a health care facility    -  Primary   Annual labs today.   Prostate cancer screening       PSA on labs today   Relevant Orders   PSA   Screening for AAA (abdominal aortic aneurysm)       Order for AAA screening as recommended -- age >41 and smoker.   Relevant Orders   [place] MEDICARE SCREENING      Discussed aspirin prophylaxis for myocardial infarction prevention and decision was it was not indicated  LABORATORY TESTING:  Health maintenance labs ordered today as discussed above.   The natural history of prostate cancer and ongoing controversy regarding screening and potential treatment outcomes of prostate cancer has been discussed with the patient. The meaning of a false positive PSA and a false negative PSA has been discussed. He indicates understanding of the limitations of this screening test and wishes to proceed with screening PSA testing.  IMMUNIZATIONS:   - Tdap: Tetanus vaccination status reviewed: just received Covid Booster - Influenza: Up to date - Pneumovax: just received Covid Booster - Prevnar: Up to date - Zostavax vaccine: Refused  SCREENING: - Colonoscopy: got kit from [facility] -- plans on doing this and will call results Discussed with patient purpose of the colonoscopy is to detect colon cancer at curable precancerous or early stages   - AAA Screening: ordered today -Hearing Test: Not applicable  -Spirometry: Up to date   PATIENT COUNSELING:    Sexuality: Discussed sexually transmitted diseases, partner selection, use of condoms, avoidance of unintended pregnancy  and contraceptive alternatives.   Advised to avoid cigarette  smoking.  I discussed with the patient that most people either abstain from alcohol or drink within safe limits (<=14/week and <=4 drinks/occasion for males, <=7/weeks and <= 3 drinks/occasion for females) and that the risk for alcohol disorders and other health effects  rises proportionally with the number of drinks per week and how often a drinker exceeds daily limits.  Discussed cessation/primary prevention of drug use and availability of treatment for abuse.   Diet: Encouraged to adjust caloric intake to maintain  or achieve ideal body weight, to reduce intake of dietary saturated fat and total fat, to limit sodium intake by avoiding high sodium foods and not adding table salt, and to maintain adequate dietary potassium and calcium preferably from fresh fruits, vegetables, and low-fat dairy products.    stressed the importance of regular exercise  Injury prevention: Discussed safety belts, safety helmets, smoke detector, smoking near bedding or upholstery.   Dental health: Discussed importance of regular tooth brushing, flossing, and dental visits.   Follow up plan: NEXT PREVENTATIVE PHYSICAL DUE IN 1 YEAR. Return in about 6 months (around 11/01/2020) for COPD, HLD, Hep C -- need spirometry.

## 2020-05-03 NOTE — Assessment & Plan Note (Addendum)
Chronic, stable.  Spirometry FEV1/FVC 90% and FEV1 76% in 2020.   Recommend complete smoking cessation to slow down progression of disease process.  Continue current medication regimen, discussed possible change to Anoro vs Symbicort but it is too costly at this time, consider for future and consider CCM referral in future.  He refuses Lung CA Screening CT at this time due to Covid, recommended he obtain this.  Return in 6 months for follow-up and repeat spirometry.

## 2020-05-03 NOTE — Assessment & Plan Note (Signed)
Chronic, ongoing.  Continue Sildenafil as needed.

## 2020-05-03 NOTE — Patient Instructions (Signed)
Chronic Obstructive Pulmonary Disease Chronic obstructive pulmonary disease (COPD) is a long-term (chronic) lung problem. When you have COPD, it is hard for air to get in and out of your lungs. Usually the condition gets worse over time, and your lungs will never return to normal. There are things you can do to keep yourself as healthy as possible.  Your doctor may treat your condition with: ? Medicines. ? Oxygen. ? Lung surgery.  Your doctor may also recommend: ? Rehabilitation. This includes steps to make your body work better. It may involve a team of specialists. ? Quitting smoking, if you smoke. ? Exercise and changes to your diet. ? Comfort measures (palliative care). Follow these instructions at home: Medicines  Take over-the-counter and prescription medicines only as told by your doctor.  Talk to your doctor before taking any cough or allergy medicines. You may need to avoid medicines that cause your lungs to be dry. Lifestyle  If you smoke, stop. Smoking makes the problem worse. If you need help quitting, ask your doctor.  Avoid being around things that make your breathing worse. This may include smoke, chemicals, and fumes.  Stay active, but remember to rest as well.  Learn and use tips on how to relax.  Make sure you get enough sleep. Most adults need at least 7 hours of sleep every night.  Eat healthy foods. Eat smaller meals more often. Rest before meals. Controlled breathing Learn and use tips on how to control your breathing as told by your doctor. Try:  Breathing in (inhaling) through your nose for 1 second. Then, pucker your lips and breath out (exhale) through your lips for 2 seconds.  Putting one hand on your belly (abdomen). Breathe in slowly through your nose for 1 second. Your hand on your belly should move out. Pucker your lips and breathe out slowly through your lips. Your hand on your belly should move in as you breathe out.  Controlled coughing Learn  and use controlled coughing to clear mucus from your lungs. Follow these steps: 1. Lean your head a little forward. 2. Breathe in deeply. 3. Try to hold your breath for 3 seconds. 4. Keep your mouth slightly open while coughing 2 times. 5. Spit any mucus out into a tissue. 6. Rest and do the steps again 1 or 2 times as needed. General instructions  Make sure you get all the shots (vaccines) that your doctor recommends. Ask your doctor about a flu shot and a pneumonia shot.  Use oxygen therapy and pulmonary rehabilitation if told by your doctor. If you need home oxygen therapy, ask your doctor if you should buy a tool to measure your oxygen level (oximeter).  Make a COPD action plan with your doctor. This helps you to know what to do if you feel worse than usual.  Manage any other conditions you have as told by your doctor.  Avoid going outside when it is very hot, cold, or humid.  Avoid people who have a sickness you can catch (contagious).  Keep all follow-up visits as told by your doctor. This is important. Contact a doctor if:  You cough up more mucus than usual.  There is a change in the color or thickness of the mucus.  It is harder to breathe than usual.  Your breathing is faster than usual.  You have trouble sleeping.  You need to use your medicines more often than usual.  You have trouble doing your normal activities such as getting dressed   or walking around the house. Get help right away if:  You have shortness of breath while resting.  You have shortness of breath that stops you from: ? Being able to talk. ? Doing normal activities.  Your chest hurts for longer than 5 minutes.  Your skin color is more blue than usual.  Your pulse oximeter shows that you have low oxygen for longer than 5 minutes.  You have a fever.  You feel too tired to breathe normally. Summary  Chronic obstructive pulmonary disease (COPD) is a long-term lung problem.  The way your  lungs work will never return to normal. Usually the condition gets worse over time. There are things you can do to keep yourself as healthy as possible.  Take over-the-counter and prescription medicines only as told by your doctor.  If you smoke, stop. Smoking makes the problem worse. This information is not intended to replace advice given to you by your health care provider. Make sure you discuss any questions you have with your health care provider. Document Revised: 06/14/2017 Document Reviewed: 08/06/2016 Elsevier Patient Education  2020 [employer]vier Inc.  

## 2020-05-04 ENCOUNTER — Encounter: Payer: Self-pay | Admitting: Nurse Practitioner

## 2020-05-04 LAB — CBC WITH DIFFERENTIAL/PLATELET
Basophils Absolute: 0 10*3/uL (ref 0.0–0.2)
Basos: 0 %
EOS (ABSOLUTE): 0.2 10*3/uL (ref 0.0–0.4)
Eos: 2 %
Hematocrit: 45 % (ref 37.5–51.0)
Hemoglobin: 16.2 g/dL (ref 13.0–17.7)
Immature Grans (Abs): 0 10*3/uL (ref 0.0–0.1)
Immature Granulocytes: 0 %
Lymphocytes Absolute: 3 10*3/uL (ref 0.7–3.1)
Lymphs: 32 %
MCH: 34.5 pg — ABNORMAL HIGH (ref 26.6–33.0)
MCHC: 36 g/dL — ABNORMAL HIGH (ref 31.5–35.7)
MCV: 96 fL (ref 79–97)
Monocytes Absolute: 0.9 10*3/uL (ref 0.1–0.9)
Monocytes: 9 %
Neutrophils Absolute: 5.4 10*3/uL (ref 1.4–7.0)
Neutrophils: 57 %
Platelets: 322 10*3/uL (ref 150–450)
RBC: 4.69 x10E6/uL (ref 4.14–5.80)
RDW: 12.4 % (ref 11.6–15.4)
WBC: 9.5 10*3/uL (ref 3.4–10.8)

## 2020-05-04 LAB — COMPREHENSIVE METABOLIC PANEL
ALT: 34 IU/L (ref 0–44)
AST: 27 IU/L (ref 0–40)
Albumin/Globulin Ratio: 1.7 (ref 1.2–2.2)
Albumin: 4.5 g/dL (ref 3.8–4.8)
Alkaline Phosphatase: 75 IU/L (ref 44–121)
BUN/Creatinine Ratio: 13 (ref 10–24)
BUN: 11 mg/dL (ref 8–27)
Bilirubin Total: 0.6 mg/dL (ref 0.0–1.2)
CO2: 26 mmol/L (ref 20–29)
Calcium: 9.8 mg/dL (ref 8.6–10.2)
Chloride: 101 mmol/L (ref 96–106)
Creatinine, Ser: 0.82 mg/dL (ref 0.76–1.27)
GFR calc Af Amer: 107 mL/min/{1.73_m2} (ref >59)
GFR calc non Af Amer: 92 mL/min/{1.73_m2} (ref >59)
Globulin, Total: 2.7 g/dL (ref 1.5–4.5)
Glucose: 87 mg/dL (ref 65–99)
Potassium: 4.5 mmol/L (ref 3.5–5.2)
Sodium: 138 mmol/L (ref 134–144)
Total Protein: 7.2 g/dL (ref 6.0–8.5)

## 2020-05-04 LAB — TSH: TSH: 0.715 u[IU]/mL (ref 0.450–4.500)

## 2020-05-04 LAB — LIPID PANEL W/O CHOL/HDL RATIO
Cholesterol, Total: 183 mg/dL (ref 100–199)
HDL: 40 mg/dL (ref >39)
LDL Chol Calc (NIH): 122 mg/dL — ABNORMAL HIGH (ref 0–99)
Triglycerides: 117 mg/dL (ref 0–149)
VLDL Cholesterol Cal: 21 mg/dL (ref 5–40)

## 2020-05-04 LAB — PSA: Prostate Specific Ag, Serum: 1.3 ng/mL (ref 0.0–4.0)

## 2020-05-04 NOTE — Progress Notes (Signed)
Letter out recommending statin therapy.

## 2020-05-12 ENCOUNTER — Ambulatory Visit: Payer: Medicare Other

## 2020-05-16 ENCOUNTER — Other Ambulatory Visit: Payer: Self-pay

## 2020-05-16 ENCOUNTER — Ambulatory Visit
Admission: RE | Admit: 2020-05-16 | Discharge: 2020-05-16 | Disposition: A | Discharge status: Home / Self Care | Payer: Medicare Other | Source: Ambulatory Visit | Attending: Nurse Practitioner | Admitting: Nurse Practitioner

## 2020-05-16 DIAGNOSIS — Z136 Encounter for screening for cardiovascular disorders: Secondary | ICD-10-CM | POA: Diagnosis not present

## 2020-05-16 DIAGNOSIS — F17210 Nicotine dependence, cigarettes, uncomplicated: Secondary | ICD-10-CM | POA: Insufficient documentation

## 2020-05-16 DIAGNOSIS — Z87891 Personal history of nicotine dependence: Secondary | ICD-10-CM | POA: Diagnosis not present

## 2020-05-16 NOTE — Progress Notes (Signed)
Please let [place] know that aortic aneurysm screen returned negative.  Great news.  Have a good day!! Keep being awesome!!  Thank you for allowing me to participate in your care. Kindest regards, Kelle Ruppert

## 2020-05-28 ENCOUNTER — Other Ambulatory Visit: Payer: Self-pay | Admitting: Nurse Practitioner

## 2020-05-28 NOTE — Telephone Encounter (Signed)
Requested Prescriptions  Pending Prescriptions Disp Refills  . PROAIR HFA 108 (90 Base) MCG/ACT inhaler [Pharmacy Med Name: PROAIR HFA 90 MCG INHALER] 8.5 g 0    Sig: Inhale 2 puffs into the lungs every 4 (four) hours as needed.     Pulmonology:  Beta Agonists Failed - 05/28/2020 12:42 PM      Failed - One inhaler should last at least one month. If the patient is requesting refills earlier, contact the patient to check for uncontrolled symptoms.      Passed - Valid encounter within last 12 months    Recent Outpatient Visits          3 weeks ago Routine general medical examination at a health care facility   [facility], Wallace T, NP   4 months ago Groin pain, left   [facility] [place], [place] T, NP   8 months ago Chronic obstructive pulmonary disease, unspecified COPD type (HCC)   [name], Jolene T, NP   1 year ago Chronic obstructive pulmonary disease, unspecified COPD type (HCC)   [name], Jolene T, NP   1 year ago Chronic obstructive pulmonary disease, unspecified COPD type (HCC)   [name], Dorie Rank, NP

## 2020-06-24 ENCOUNTER — Other Ambulatory Visit: Payer: Self-pay

## 2020-06-24 MED ORDER — PROAIR HFA 108 (90 BASE) MCG/ACT IN AERS
2.0000 | INHALATION_SPRAY | RESPIRATORY_TRACT | 4 refills | Status: DC | PRN
Start: 2020-06-24 — End: 2021-08-28

## 2020-06-24 NOTE — Telephone Encounter (Signed)
Pt stated she needed a refill of his  PROAIR HFA 108 (90 Base) MCG/ACT inhaler Pt stated he will be competly out by next week. Asked if he could about 6 refills.Please call pt if this can be done asked if he did not answer Lvm

## 2020-06-24 NOTE — Telephone Encounter (Signed)
Patient last seen 05/03/20 for CPE

## 2020-07-27 ENCOUNTER — Telehealth: Payer: Self-pay

## 2020-07-27 NOTE — Telephone Encounter (Signed)
PA for sildenafil initiated and submitted via cover my meds. Key: [id number]

## 2020-07-28 ENCOUNTER — Emergency Department: Payer: Medicare Other

## 2020-07-28 ENCOUNTER — Encounter: Payer: Self-pay | Admitting: Emergency Medicine

## 2020-07-28 ENCOUNTER — Encounter
Admission: EM | Disposition: A | Discharge status: Home / Self Care | Payer: Self-pay | Source: Home / Self Care | Attending: Family Medicine

## 2020-07-28 ENCOUNTER — Other Ambulatory Visit: Payer: Self-pay

## 2020-07-28 ENCOUNTER — Inpatient Hospital Stay
Admission: EM | Admit: 2020-07-28 | Discharge: 2020-07-30 | DRG: 246 | Disposition: A | Discharge status: Home / Self Care | Payer: Medicare Other | Attending: Family Medicine | Admitting: Family Medicine

## 2020-07-28 DIAGNOSIS — I491 Atrial premature depolarization: Secondary | ICD-10-CM | POA: Diagnosis not present

## 2020-07-28 DIAGNOSIS — I2510 Atherosclerotic heart disease of native coronary artery without angina pectoris: Secondary | ICD-10-CM | POA: Diagnosis not present

## 2020-07-28 DIAGNOSIS — I462 Cardiac arrest due to underlying cardiac condition: Secondary | ICD-10-CM | POA: Diagnosis not present

## 2020-07-28 DIAGNOSIS — I2111 ST elevation (STEMI) myocardial infarction involving right coronary artery: Principal | ICD-10-CM | POA: Diagnosis present

## 2020-07-28 DIAGNOSIS — Z791 Long term (current) use of non-steroidal anti-inflammatories (NSAID): Secondary | ICD-10-CM

## 2020-07-28 DIAGNOSIS — D72828 Other elevated white blood cell count: Secondary | ICD-10-CM | POA: Diagnosis not present

## 2020-07-28 DIAGNOSIS — R0602 Shortness of breath: Secondary | ICD-10-CM | POA: Diagnosis not present

## 2020-07-28 DIAGNOSIS — Z743 Need for continuous supervision: Secondary | ICD-10-CM | POA: Diagnosis not present

## 2020-07-28 DIAGNOSIS — R519 Headache, unspecified: Secondary | ICD-10-CM | POA: Diagnosis not present

## 2020-07-28 DIAGNOSIS — I469 Cardiac arrest, cause unspecified: Secondary | ICD-10-CM | POA: Diagnosis not present

## 2020-07-28 DIAGNOSIS — I252 Old myocardial infarction: Secondary | ICD-10-CM | POA: Diagnosis present

## 2020-07-28 DIAGNOSIS — R9431 Abnormal electrocardiogram [ECG] [EKG]: Secondary | ICD-10-CM

## 2020-07-28 DIAGNOSIS — R0689 Other abnormalities of breathing: Secondary | ICD-10-CM | POA: Diagnosis not present

## 2020-07-28 DIAGNOSIS — E785 Hyperlipidemia, unspecified: Secondary | ICD-10-CM | POA: Diagnosis not present

## 2020-07-28 DIAGNOSIS — J449 Chronic obstructive pulmonary disease, unspecified: Secondary | ICD-10-CM | POA: Diagnosis present

## 2020-07-28 DIAGNOSIS — Z79899 Other long term (current) drug therapy: Secondary | ICD-10-CM | POA: Diagnosis not present

## 2020-07-28 DIAGNOSIS — F17210 Nicotine dependence, cigarettes, uncomplicated: Secondary | ICD-10-CM | POA: Diagnosis not present

## 2020-07-28 DIAGNOSIS — D72829 Elevated white blood cell count, unspecified: Secondary | ICD-10-CM | POA: Diagnosis not present

## 2020-07-28 DIAGNOSIS — I499 Cardiac arrhythmia, unspecified: Secondary | ICD-10-CM | POA: Diagnosis not present

## 2020-07-28 DIAGNOSIS — I219 Acute myocardial infarction, unspecified: Secondary | ICD-10-CM

## 2020-07-28 DIAGNOSIS — Z20822 Contact with and (suspected) exposure to covid-19: Secondary | ICD-10-CM | POA: Diagnosis not present

## 2020-07-28 DIAGNOSIS — I249 Acute ischemic heart disease, unspecified: Secondary | ICD-10-CM | POA: Diagnosis not present

## 2020-07-28 DIAGNOSIS — Z7951 Long term (current) use of inhaled steroids: Secondary | ICD-10-CM

## 2020-07-28 DIAGNOSIS — I2119 ST elevation (STEMI) myocardial infarction involving other coronary artery of inferior wall: Secondary | ICD-10-CM | POA: Diagnosis not present

## 2020-07-28 DIAGNOSIS — Z955 Presence of coronary angioplasty implant and graft: Secondary | ICD-10-CM

## 2020-07-28 DIAGNOSIS — I259 Chronic ischemic heart disease, unspecified: Secondary | ICD-10-CM

## 2020-07-28 DIAGNOSIS — I214 Non-ST elevation (NSTEMI) myocardial infarction: Secondary | ICD-10-CM | POA: Diagnosis not present

## 2020-07-28 DIAGNOSIS — R404 Transient alteration of awareness: Secondary | ICD-10-CM | POA: Diagnosis not present

## 2020-07-28 HISTORY — DX: Acute myocardial infarction, unspecified: I21.9

## 2020-07-28 HISTORY — PX: LEFT HEART CATH AND CORONARY ANGIOGRAPHY: CATH118249

## 2020-07-28 HISTORY — PX: CORONARY/GRAFT ACUTE MI REVASCULARIZATION: CATH118305

## 2020-07-28 HISTORY — DX: Cardiac arrest, cause unspecified: I46.9

## 2020-07-28 HISTORY — DX: Presence of coronary angioplasty implant and graft: Z95.5

## 2020-07-28 LAB — BRAIN NATRIURETIC PEPTIDE: B Natriuretic Peptide: 37.9 pg/mL (ref 0.0–100.0)

## 2020-07-28 LAB — APTT: aPTT: 70 seconds — ABNORMAL HIGH (ref 24–36)

## 2020-07-28 LAB — RESP PANEL BY RT-PCR (FLU A&B, COVID) ARPGX2
Influenza A by PCR: NEGATIVE
Influenza B by PCR: NEGATIVE
SARS Coronavirus 2 by RT PCR: NEGATIVE

## 2020-07-28 LAB — CBC WITH DIFFERENTIAL/PLATELET
Abs Immature Granulocytes: 0.29 10*3/uL — ABNORMAL HIGH (ref 0.00–0.07)
Basophils Absolute: 0.1 10*3/uL (ref 0.0–0.1)
Basophils Relative: 1 %
Eosinophils Absolute: 0.4 10*3/uL (ref 0.0–0.5)
Eosinophils Relative: 3 %
HCT: 47.2 % (ref 39.0–52.0)
Hemoglobin: 16.2 g/dL (ref 13.0–17.0)
Immature Granulocytes: 2 %
Lymphocytes Relative: 26 %
Lymphs Abs: 3.8 10*3/uL (ref 0.7–4.0)
MCH: 33.7 pg (ref 26.0–34.0)
MCHC: 34.3 g/dL (ref 30.0–36.0)
MCV: 98.1 fL (ref 80.0–100.0)
Monocytes Absolute: 1.1 10*3/uL — ABNORMAL HIGH (ref 0.1–1.0)
Monocytes Relative: 8 %
Neutro Abs: 9 10*3/uL — ABNORMAL HIGH (ref 1.7–7.7)
Neutrophils Relative %: 60 %
Platelets: 200 10*3/uL (ref 150–400)
RBC: 4.81 MIL/uL (ref 4.22–5.81)
RDW: 12.5 % (ref 11.5–15.5)
WBC: 14.6 10*3/uL — ABNORMAL HIGH (ref 4.0–10.5)
nRBC: 0 % (ref 0.0–0.2)

## 2020-07-28 LAB — MRSA PCR SCREENING: MRSA by PCR: NEGATIVE

## 2020-07-28 LAB — BLOOD GAS, VENOUS
Acid-base deficit: 1.7 mmol/L (ref 0.0–2.0)
Bicarbonate: 24.8 mmol/L (ref 20.0–28.0)
O2 Saturation: 82 %
Patient temperature: 37
pCO2, Ven: 47 mmHg (ref 44.0–60.0)
pH, Ven: 7.33 (ref 7.250–7.430)
pO2, Ven: 50 mmHg — ABNORMAL HIGH (ref 32.0–45.0)

## 2020-07-28 LAB — MAGNESIUM: Magnesium: 1.9 mg/dL (ref 1.7–2.4)

## 2020-07-28 LAB — COMPREHENSIVE METABOLIC PANEL
ALT: 31 U/L (ref 0–44)
AST: 43 U/L — ABNORMAL HIGH (ref 15–41)
Albumin: 3.6 g/dL (ref 3.5–5.0)
Alkaline Phosphatase: 62 U/L (ref 38–126)
Anion gap: 16 — ABNORMAL HIGH (ref 5–15)
BUN: 12 mg/dL (ref 8–23)
CO2: 16 mmol/L — ABNORMAL LOW (ref 22–32)
Calcium: 8.5 mg/dL — ABNORMAL LOW (ref 8.9–10.3)
Chloride: 104 mmol/L (ref 98–111)
Creatinine, Ser: 0.71 mg/dL (ref 0.61–1.24)
GFR, Estimated: >60 mL/min (ref >60)
Glucose, Bld: 213 mg/dL — ABNORMAL HIGH (ref 70–99)
Potassium: 3.8 mmol/L (ref 3.5–5.1)
Sodium: 136 mmol/L (ref 135–145)
Total Bilirubin: 0.7 mg/dL (ref 0.3–1.2)
Total Protein: 6.5 g/dL (ref 6.5–8.1)

## 2020-07-28 LAB — PROTIME-INR
INR: 1.1 (ref 0.8–1.2)
Prothrombin Time: 13.9 seconds (ref 11.4–15.2)

## 2020-07-28 LAB — POCT ACTIVATED CLOTTING TIME: Activated Clotting Time: 470 seconds

## 2020-07-28 LAB — GLUCOSE, CAPILLARY: Glucose-Capillary: 120 mg/dL — ABNORMAL HIGH (ref 70–99)

## 2020-07-28 LAB — TROPONIN I (HIGH SENSITIVITY)
Troponin I (High Sensitivity): 241 ng/L (ref <18)
Troponin I (High Sensitivity): 3427 ng/L (ref <18)

## 2020-07-28 LAB — ETHANOL: Alcohol, Ethyl (B): <10 mg/dL (ref <10)

## 2020-07-28 SURGERY — LEFT HEART CATH AND CORONARY ANGIOGRAPHY
Anesthesia: Moderate Sedation

## 2020-07-28 MED ORDER — ATORVASTATIN CALCIUM 80 MG PO TABS
80.0000 mg | ORAL_TABLET | Freq: Every day | ORAL | Status: DC
  Administered 2020-07-29 – 2020-07-30 (×2): 80 mg via ORAL
  Filled 2020-07-28 (×3): qty 1

## 2020-07-28 MED ORDER — ONDANSETRON HCL 4 MG/2ML IJ SOLN: 4.0000 mg | Freq: Three times a day (TID) | INTRAMUSCULAR | Status: DC | PRN

## 2020-07-28 MED ORDER — HEPARIN (PORCINE) IN NACL 2000-0.9 UNIT/L-% IV SOLN
INTRAVENOUS | Status: DC | PRN
  Administered 2020-07-28: 1000 mL

## 2020-07-28 MED ORDER — TICAGRELOR 90 MG PO TABS
ORAL_TABLET | ORAL | Status: DC | PRN
  Administered 2020-07-28: 180 mg via ORAL

## 2020-07-28 MED ORDER — BIVALIRUDIN BOLUS VIA INFUSION - CUPID
INTRAVENOUS | Status: DC | PRN
  Administered 2020-07-28: 59.55 mg via INTRAVENOUS

## 2020-07-28 MED ORDER — IOHEXOL 300 MG/ML  SOLN
INTRAMUSCULAR | Status: DC | PRN
  Administered 2020-07-28: 450 mL

## 2020-07-28 MED ORDER — ASPIRIN 81 MG PO CHEW
324.0000 mg | CHEWABLE_TABLET | Freq: Once | ORAL | Status: AC
  Administered 2020-07-28: 324 mg via ORAL
  Filled 2020-07-28: qty 4

## 2020-07-28 MED ORDER — MOMETASONE FURO-FORMOTEROL FUM 200-5 MCG/ACT IN AERO
2.0000 | INHALATION_SPRAY | Freq: Two times a day (BID) | RESPIRATORY_TRACT | Status: DC
  Administered 2020-07-29 – 2020-07-30 (×3): 2 via RESPIRATORY_TRACT
  Filled 2020-07-28: qty 8.8

## 2020-07-28 MED ORDER — FENTANYL CITRATE (PF) 100 MCG/2ML IJ SOLN
INTRAMUSCULAR | Status: DC | PRN
  Administered 2020-07-28: 25 ug via INTRAVENOUS

## 2020-07-28 MED ORDER — TICAGRELOR 90 MG PO TABS
ORAL_TABLET | ORAL | Status: AC
  Filled 2020-07-28: qty 2

## 2020-07-28 MED ORDER — DM-GUAIFENESIN ER 30-600 MG PO TB12
1.0000 | ORAL_TABLET | Freq: Two times a day (BID) | ORAL | Status: DC | PRN
  Filled 2020-07-28: qty 1

## 2020-07-28 MED ORDER — SODIUM CHLORIDE 0.9 % IV SOLN
INTRAVENOUS | Status: AC | PRN
  Administered 2020-07-28: 250 mL via INTRAVENOUS

## 2020-07-28 MED ORDER — IPRATROPIUM-ALBUTEROL 0.5-2.5 (3) MG/3ML IN SOLN: 3.0000 mL | RESPIRATORY_TRACT | Status: DC

## 2020-07-28 MED ORDER — LIDOCAINE HCL (PF) 1 % IJ SOLN
INTRAMUSCULAR | Status: AC
  Filled 2020-07-28: qty 30

## 2020-07-28 MED ORDER — IPRATROPIUM-ALBUTEROL 0.5-2.5 (3) MG/3ML IN SOLN
3.0000 mL | Freq: Three times a day (TID) | RESPIRATORY_TRACT | Status: DC
  Administered 2020-07-28 – 2020-07-29 (×3): 3 mL via RESPIRATORY_TRACT
  Filled 2020-07-28 (×3): qty 3

## 2020-07-28 MED ORDER — SODIUM CHLORIDE 0.9 % IV SOLN: Freq: Once | INTRAVENOUS | Status: AC

## 2020-07-28 MED ORDER — BIVALIRUDIN TRIFLUOROACETATE 250 MG IV SOLR
INTRAVENOUS | Status: AC
  Filled 2020-07-28: qty 250

## 2020-07-28 MED ORDER — HEPARIN BOLUS VIA INFUSION
4000.0000 [IU] | Freq: Once | INTRAVENOUS | Status: AC
  Administered 2020-07-28: 4000 [IU] via INTRAVENOUS
  Filled 2020-07-28: qty 4000

## 2020-07-28 MED ORDER — HEPARIN (PORCINE) 25000 UT/250ML-% IV SOLN
950.0000 [IU]/h | INTRAVENOUS | Status: DC
  Administered 2020-07-28: 950 [IU]/h via INTRAVENOUS
  Filled 2020-07-28: qty 250

## 2020-07-28 MED ORDER — METOPROLOL TARTRATE 25 MG PO TABS
12.5000 mg | ORAL_TABLET | Freq: Two times a day (BID) | ORAL | Status: DC
  Administered 2020-07-28 – 2020-07-30 (×4): 12.5 mg via ORAL
  Filled 2020-07-28 (×5): qty 1

## 2020-07-28 MED ORDER — SODIUM CHLORIDE 0.9 % IV SOLN
INTRAVENOUS | Status: AC | PRN
  Administered 2020-07-28 (×2): 1.75 mg/kg/h via INTRAVENOUS

## 2020-07-28 MED ORDER — ONDANSETRON HCL 4 MG/2ML IJ SOLN
INTRAMUSCULAR | Status: DC | PRN
  Administered 2020-07-28: 4 mg via INTRAVENOUS

## 2020-07-28 MED ORDER — SODIUM CHLORIDE 0.9% FLUSH
3.0000 mL | Freq: Two times a day (BID) | INTRAVENOUS | Status: DC
  Administered 2020-07-29 – 2020-07-30 (×3): 3 mL via INTRAVENOUS

## 2020-07-28 MED ORDER — NITROGLYCERIN 0.4 MG SL SUBL: 0.4000 mg | SUBLINGUAL_TABLET | SUBLINGUAL | Status: DC | PRN

## 2020-07-28 MED ORDER — SODIUM CHLORIDE 0.9 % IV SOLN
0.2500 mg/kg/h | INTRAVENOUS | Status: AC
  Administered 2020-07-28: 0.25 mg/kg/h via INTRAVENOUS
  Filled 2020-07-28: qty 250

## 2020-07-28 MED ORDER — LIDOCAINE HCL (PF) 1 % IJ SOLN
INTRAMUSCULAR | Status: DC | PRN
  Administered 2020-07-28: 10 mL

## 2020-07-28 MED ORDER — HEPARIN (PORCINE) IN NACL 2000-0.9 UNIT/L-% IV SOLN: INTRAVENOUS | Status: DC | PRN

## 2020-07-28 MED ORDER — ACETAMINOPHEN 325 MG PO TABS
650.0000 mg | ORAL_TABLET | Freq: Four times a day (QID) | ORAL | Status: DC | PRN
  Administered 2020-07-28 – 2020-07-29 (×3): 650 mg via ORAL
  Filled 2020-07-28 (×3): qty 2

## 2020-07-28 MED ORDER — ONDANSETRON HCL 4 MG/2ML IJ SOLN
INTRAMUSCULAR | Status: AC
  Filled 2020-07-28: qty 2

## 2020-07-28 MED ORDER — FENTANYL CITRATE (PF) 100 MCG/2ML IJ SOLN
INTRAMUSCULAR | Status: AC
  Filled 2020-07-28: qty 2

## 2020-07-28 MED ORDER — LISINOPRIL 5 MG PO TABS
5.0000 mg | ORAL_TABLET | Freq: Every day | ORAL | Status: DC
  Administered 2020-07-29 – 2020-07-30 (×2): 5 mg via ORAL
  Filled 2020-07-28 (×2): qty 1

## 2020-07-28 SURGICAL SUPPLY — 19 items
BALLN MINITREK RX 2.0X12 (BALLOONS) ×2
BALLOON MINITREK RX 2.0X12 (BALLOONS) ×1 IMPLANT
CATH INFINITI 5FR JL4 (CATHETERS) ×2 IMPLANT
CATH INFINITI JR4 5F (CATHETERS) ×2 IMPLANT
CATH VISTA GUIDE 6FR JR4 (CATHETERS) ×2 IMPLANT
DEVICE CLOSURE MYNXGRIP 6/7F (Vascular Products) ×2 IMPLANT
GUIDELINER 6F (CATHETERS) ×2 IMPLANT
KIT MANI 3VAL PERCEP (MISCELLANEOUS) ×2 IMPLANT
NEEDLE PERC 18GX7CM (NEEDLE) ×2 IMPLANT
PACK CARDIAC CATH (CUSTOM PROCEDURE TRAY) ×2 IMPLANT
SHEATH AVANTI 6FR X 11CM (SHEATH) ×2 IMPLANT
STENT RESOLUTE ONYX 2.5X26 (Permanent Stent) ×2 IMPLANT
STENT RESOLUTE ONYX 2.5X38 (Permanent Stent) ×2 IMPLANT
WIRE ASAHI GRAND SLAM 180CM (WIRE) ×4 IMPLANT
WIRE ASAHI PROWATER 180CM (WIRE) ×2 IMPLANT
WIRE G HI TQ BMW 190 (WIRE) ×2 IMPLANT
WIRE GUIDERIGHT .035X150 (WIRE) ×2 IMPLANT
WIRE HI TORQ WHISPER MS 190CM (WIRE) ×2 IMPLANT
WIRE RUNTHROUGH .014X180CM (WIRE) ×2 IMPLANT

## 2020-07-28 NOTE — Consult Note (Signed)
Cardiology Consultation Note    Patient ID: KYRIAN STAGE, MRN: [id number], DOB/AGE: [birth year] 68 y.o. Admit date: 07/28/2020   Date of Consult: 07/28/2020 Primary Physician: [name], NP Primary Cardiologist:    Chief Complaint: cardiac arrest Reason for Consultation: STEMI Requesting MD: Dr. Sheila Oats  HPI: Andrew Potts is a 70 y.o. male with history of COPD treated with bronchodilators but no cardiac history who had a syncopal episode while at work.  Was noted to be in VF.  Received CPR and was shocked back to sinus tachycardia.  Was brought to the emergency room where electrocardiogram showed 1-2 and 1 mm of ST elevation in the inferior leads with anterior depression.  Had mild chest pain with mild headache.  Due to his syncopal episode and fall brain CT is being done urgently prior to heparinization.  Has mild chest pain.  He does not recall any chest pain prior to this event.  Has had no cardiac work-up in the past.  Past Medical History:  Diagnosis Date  . Arthritis   . COPD (chronic obstructive pulmonary disease) (HCC)       Surgical History:  Past Surgical History:  Procedure Laterality Date  . ABDOMINAL EXPLORATION SURGERY       Home Meds: Prior to Admission medications   Medication Sig Start Date End Date Taking? Authorizing Provider  meloxicam (MOBIC) 15 MG tablet Take 1 tablet (15 mg total) by mouth daily. 01/11/20   [name], Corrie Dandy T, NP  PROAIR HFA 108 (90 Base) MCG/ACT inhaler Inhale 2 puffs into the lungs every 4 (four) hours as needed. 06/24/20   [name], Corrie Dandy T, NP  sildenafil (REVATIO) 20 MG tablet Take 1 tablet (20 mg total) by mouth as needed. Daily only 05/03/20   [name], Corrie Dandy T, NP  SYMBICORT 160-4.5 MCG/ACT inhaler Inhale 2 puffs into the lungs 2 (two) times daily. 04/28/20   Aura Dials T, NP    Inpatient Medications:  . aspirin  324 mg Oral Once  . heparin  4,000 Units Intravenous Once  . sodium chloride flush  3 mL Intravenous  Q12H   . sodium chloride    . heparin      Allergies:  Allergies  Allergen Reactions  . Codeine   . Codone [Hydrocodone] Other (See Comments)    Abdominal pain  . Levaquin [Levofloxacin] Swelling    Social History   Socioeconomic History  . Marital status: Divorced    Spouse name: Not on file  . Number of children: Not on file  . Years of education: Not on file  . Highest education level: Not on file  Occupational History  . Occupation: unload trucks    Comment: [employer]  Tobacco Use  . Smoking status: Current Every Day Smoker    Packs/day: 0.50    Years: 47.00    Pack years: 23.50  . Smokeless tobacco: Never Used  Vaping Use  . Vaping Use: Never used  Substance and Sexual Activity  . Alcohol use: Not Currently  . Drug use: Never  . Sexual activity: Yes  Other Topics Concern  . Not on file  Social History Narrative  . Not on file   Social Determinants of Health   Financial Resource Strain: Not on file  Food Insecurity: Not on file  Transportation Needs: Not on file  Physical Activity: Not on file  Stress: Not on file  Social Connections: Not on file  Intimate Partner Violence: Not on file     Family  History  Problem Relation Age of Onset  . Alcohol abuse Father   . Alcohol abuse Brother   . Aneurysm Brother      Review of Systems: A 12-system review of systems was performed and is negative except as noted in the HPI.  Labs: No results for input(s): CKTOTAL, CKMB, TROPONINI in the last 72 hours. Lab Results  Component Value Date   WBC 9.5 05/03/2020   HGB 16.2 05/03/2020   HCT 45.0 05/03/2020   MCV 96 05/03/2020   PLT 322 05/03/2020   No results for input(s): NA, K, CL, CO2, BUN, CREATININE, CALCIUM, PROT, BILITOT, ALKPHOS, ALT, AST, GLUCOSE in the last 168 hours.  Invalid input(s): LABALBU Lab Results  Component Value Date   CHOL 183 05/03/2020   HDL 40 05/03/2020   LDLCALC 122 (H) 05/03/2020   TRIG 117 05/03/2020   No results found  for: DDIMER  Radiology/Studies:  DG Chest Portable 1 View  Result Date: 07/28/2020 CLINICAL DATA:  Shortness of breath.  Cardiac arrest. EXAM: PORTABLE CHEST 1 VIEW COMPARISON:  CT report 11/12/2002. FINDINGS: Mild prominence of the superior mediastinum noted. This may be secondary to AP technique. Follow-up PA and lateral chest x-ray suggested. Heart size normal. Low lung volumes. Very mild bilateral interstitial prominence. Mild interstitial edema and/or pneumonitis can not be excluded. No pleural effusion or pneumothorax. No acute bony abnormality. IMPRESSION: 1. Mild prominence of the superior mediastinum. This may be secondary to AP technique. Follow-up PA and lateral chest x-ray suggested. Heart size normal. 2. Low lung volumes. Very mild bilateral interstitial prominence. Mild interstitial edema and/or pneumonitis can not be excluded. Electronically Signed   By: Maisie Fus  Register   On: 07/28/2020 08:52    Wt Readings from Last 3 Encounters:  07/28/20 79.4 kg  05/03/20 69 kg  01/11/20 65.9 kg    EKG: Sinus rhythm with millimeter ST elevation in the inferior leads with 2 mm of depression and anterior leads.  Physical Exam:  Blood pressure 111/67, pulse (!) 102, temperature 97.8 F (36.6 C), temperature source Oral, resp. rate (!) 22, height 5\' 7"  (1.702 m), weight 79.4 kg, SpO2 99 %. Body mass index is 27.41 kg/m. General: Well developed, well nourished, in no acute distress. Head: Normocephalic, atraumatic, sclera non-icteric, no xanthomas, nares are without discharge.  Neck: Negative for carotid bruits. JVD not elevated. Lungs: Clear bilaterally to auscultation without wheezes, rales, or rhonchi. Breathing is unlabored. Heart: RRR with S1 S2. No murmurs, rubs, or gallops appreciated. Abdomen: Soft, non-tender, non-distended with normoactive bowel sounds. No hepatomegaly. No rebound/guarding. No obvious abdominal masses. Msk:  Strength and tone appear normal for age. Extremities: No  clubbing or cyanosis. No edema.  Distal pedal pulses are 2+ and equal bilaterally. Neuro: Alert and oriented X 3. No facial asymmetry. No focal deficit. Moves all extremities spontaneously. Psych:  Responds to questions appropriately with a normal affect.     Assessment and Plan  70 year old male with no prior cardiac history who suffered a syncopal episode most likely secondary to a VF arrest in the field.  Received CPR and shocked back to sinus rhythm.  Has mild chest pain and a mild headache.  Hemodynamically stable.  Brain CT done emergently to evaluate for any intracranial abnormality prior to heparinization.  If this is negative, will proceed with left heart cath emergently.  Signed, 81 MD 07/28/2020, 9:01 AM Pager: [phone]

## 2020-07-28 NOTE — H&P (Signed)
History and Physical    EDY BELT PPI:[id number] DOB: Jan 09, 1951 DOA: 07/28/2020  Referring MD/NP/PA:   PCP: [name], NP   Patient coming from:  The patient is coming from home.  At baseline, pt is independent for most of ADL.        Chief Complaint: cardiac arrest  HPI: Andrew Potts is a 70 y.o. male with medical history significant of hyperlipidemia, COPD, tobacco abuse, arthritis, who presents with cardiac arrest.  Per report, pt showed up to work normally this morning, but went to then went unconscious.  Per report, he was found pulseless and CPR was begun.  Fire department states that he was in V. fib on their arrival. Pt was given 3 cardioversion shocks with return of circulation. Per EDP, pt was initially had a King airway in place, but became more awake after return of circulation and has been confused but alert since then.  Pt was found to have STEMI and negative CT-head in ED. Pt underwent urgent cardiac cath with stent placement by Dr. Juliann Pares.   When I saw pt on the floor after cardiac catheterization, patient is alert oriented x3.  No unilateral numbness or tingling in extremities.  No facial droop or slurred speech.  Patient has mild frontal chest tenderness.  He states that he has chronic mild cough and mild shortness of breath due to smoking which has not changed.  No fever or chills.  Patient has nausea, no vomiting, diarrhea or abdominal pain, no symptoms of UTI.   Cardiac cath by Dr. Juliann Pares: Patient had a delay and PCI STEMI because he needed to be evaluated for CNS issues including a CT of his head Cardiac cath Patient had depressed left ventricular function inferior hypokinesis EF of around 40% LAD left main was large and had mild irregularities Circumflex was large complex with multiple bifurcation lesions proximal and within both branches very large vessel giving 3 flow RCA was large vessel as well with occlusion in the midsegment which  appeared to be the RA Successful PCI and stent with overlapping stents to fix the mid RCA improvement from 0 to TIMI-3 flow 100% down to 0% Patient was maintained on Angiomax for an additional 4 hours she is on Brilinta and aspirin Patient was transported to the ICU for care Right groin was sealed with minx  ED Course: pt was found to have trop 241, WBC 14.6, negative COVID PCR, alcohol level less than 10, INR 1.1, PTT 70, BNP 3749, renal function okay, temperature normal, blood pressure 112/72, heart rate of 102, 88, RR 22, 19, oxygen saturation 94% on room air.  CT head is negative for acute intracranial abnormalities.  Chest x-ray: 1. Mild prominence of the superior mediastinum.  2. Low lung volumes. Very mild bilateral interstitial prominence. Mild interstitial edema and/or pneumonitis can not be excluded.  Review of Systems:   General: no fevers, chills, no body weight gain, has fatigue HEENT: no blurry vision, hearing changes or sore throat Respiratory: has dyspnea, coughing, no wheezing CV: has chest pain, no palpitations GI: has nausea, no vomiting, abdominal pain, diarrhea, constipation GU: no dysuria, burning on urination, increased urinary frequency, hematuria  Ext: no leg edema Neuro: no unilateral weakness, numbness, or tingling, no vision change or hearing loss Skin: no rash, no skin tear. MSK: No muscle spasm, no deformity, no limitation of range of movement in spin Heme: No easy bruising.  Travel history: No recent long distant travel.  Allergy:  Allergies  Allergen Reactions  . Codeine   . Codone [Hydrocodone] Other (See Comments)    Abdominal pain  . Levaquin [Levofloxacin] Swelling    Past Medical History:  Diagnosis Date  . Arthritis   . COPD (chronic obstructive pulmonary disease) (HCC)     Past Surgical History:  Procedure Laterality Date  . ABDOMINAL EXPLORATION SURGERY      Social History:  reports that he has been smoking. He has a 23.50  pack-year smoking history. He has never used smokeless tobacco. He reports previous alcohol use. He reports that he does not use drugs.  Family History:  Family History  Problem Relation Age of Onset  . Alcohol abuse Father   . Alcohol abuse Brother   . Aneurysm Brother      Prior to Admission medications   Medication Sig Start Date End Date Taking? Authorizing Provider  meloxicam (MOBIC) 15 MG tablet Take 1 tablet (15 mg total) by mouth daily. 01/11/20   [name], Corrie Dandy T, NP  PROAIR HFA 108 (90 Base) MCG/ACT inhaler Inhale 2 puffs into the lungs every 4 (four) hours as needed. 06/24/20   [name], Corrie Dandy T, NP  sildenafil (REVATIO) 20 MG tablet Take 1 tablet (20 mg total) by mouth as needed. Daily only 05/03/20   [name], Corrie Dandy T, NP  SYMBICORT 160-4.5 MCG/ACT inhaler Inhale 2 puffs into the lungs 2 (two) times daily. 04/28/20   [name], NP    Physical Exam: Vitals:   07/28/20 0832 07/28/20 0834 07/28/20 0915  BP: 111/67  112/72  Pulse: (!) 102  88  Resp: (!) 22  19  Temp: 97.8 F (36.6 C)    TempSrc: Oral    SpO2: 99%  94%  Weight:  79.4 kg   Height:  5\' 7"  (1.702 m)    General: Not in acute distress HEENT:       Eyes: PERRL, EOMI, no scleral icterus.       ENT: No discharge from the ears and nose, no pharynx injection, no tonsillar enlargement.        Neck: No JVD, no bruit, no mass felt. Heme: No neck lymph node enlargement. Cardiac: S1/S2, RRR, No murmurs, No gallops or rubs. Respiratory: No rales, wheezing, rhonchi or rubs. GI: Soft, nondistended, nontender, no rebound pain, no organomegaly, BS present. GU: No hematuria Ext: No pitting leg edema bilaterally. 2+DP/PT pulse bilaterally. Musculoskeletal: No joint deformities, No joint redness or warmth, no limitation of ROM in spin. Skin: No rashes.  Neuro: Alert, oriented X3, cranial nerves II-XII grossly intact, moves all extremities normally.  Psych: Patient is not psychotic, no suicidal or hemocidal  ideation.  Labs on Admission: I have personally reviewed following labs and imaging studies  CBC: Recent Labs  Lab 07/28/20 0833  WBC 14.6*  NEUTROABS 9.0*  HGB 16.2  HCT 47.2  MCV 98.1  PLT 200   Basic Metabolic Panel: Recent Labs  Lab 07/28/20 0833  NA 136  K 3.8  CL 104  CO2 16*  GLUCOSE 213*  BUN 12  CREATININE 0.71  CALCIUM 8.5*  MG 1.9   GFR: Estimated Creatinine Clearance: 90.5 mL/min (by C-G formula based on SCr of 0.71 mg/dL). Liver Function Tests: Recent Labs  Lab 07/28/20 0833  AST 43*  ALT 31  ALKPHOS 62  BILITOT 0.7  PROT 6.5  ALBUMIN 3.6   No results for input(s): LIPASE, AMYLASE in the last 168 hours. No results for input(s): AMMONIA in the last 168 hours. Coagulation Profile: Recent Labs  Lab 07/28/20 0918  INR 1.1   Cardiac Enzymes: No results for input(s): CKTOTAL, CKMB, CKMBINDEX, TROPONINI in the last 168 hours. BNP (last 3 results) No results for input(s): PROBNP in the last 8760 hours. HbA1C: No results for input(s): HGBA1C in the last 72 hours. CBG: No results for input(s): GLUCAP in the last 168 hours. Lipid Profile: No results for input(s): CHOL, HDL, LDLCALC, TRIG, CHOLHDL, LDLDIRECT in the last 72 hours. Thyroid Function Tests: No results for input(s): TSH, T4TOTAL, FREET4, T3FREE, THYROIDAB in the last 72 hours. Anemia Panel: No results for input(s): VITAMINB12, FOLATE, FERRITIN, TIBC, IRON, RETICCTPCT in the last 72 hours. Urine analysis: No results found for: COLORURINE, APPEARANCEUR, LABSPEC, PHURINE, GLUCOSEU, HGBUR, BILIRUBINUR, KETONESUR, PROTEINUR, UROBILINOGEN, NITRITE, LEUKOCYTESUR Sepsis Labs: @LABRCNTIP (procalcitonin:4,lacticidven:4) ) Recent Results (from the past 240 hour(s))  Resp Panel by RT-PCR (Flu A&B, Covid) Nasopharyngeal Swab     Status: None   Collection Time: 07/28/20  8:33 AM   Specimen: Nasopharyngeal Swab; Nasopharyngeal(NP) swabs in vial transport medium  Result Value Ref Range Status    SARS Coronavirus 2 by RT PCR NEGATIVE NEGATIVE Final    Comment: (NOTE) SARS-CoV-2 target nucleic acids are NOT DETECTED.  The SARS-CoV-2 RNA is generally detectable in upper respiratory specimens during the acute phase of infection. The lowest concentration of SARS-CoV-2 viral copies this assay can detect is 138 copies/mL. A negative result does not preclude SARS-Cov-2 infection and should not be used as the sole basis for treatment or other patient management decisions. A negative result may occur with  improper specimen collection/handling, submission of specimen other than nasopharyngeal swab, presence of viral mutation(s) within the areas targeted by this assay, and inadequate number of viral copies(<138 copies/mL). A negative result must be combined with clinical observations, patient history, and epidemiological information. The expected result is Negative.  Fact Sheet for Patients:  BloggerCourse.comhttps://www.fda.gov/media/152166/download  Fact Sheet for Healthcare Providers:  SeriousBroker.ithttps://www.fda.gov/media/152162/download  This test is no t yet approved or cleared by the [place] FDA and  has been authorized for detection and/or diagnosis of SARS-CoV-2 by FDA under an Emergency Use Authorization (EUA). This EUA will remain  in effect (meaning this test can be used) for the duration of the COVID-19 declaration under Section 564(b)(1) of the Act, 21 U.S.C.section 360bbb-3(b)(1), unless the authorization is terminated  or revoked sooner.       Influenza A by PCR NEGATIVE NEGATIVE Final   Influenza B by PCR NEGATIVE NEGATIVE Final    Comment: (NOTE) The Xpert Xpress SARS-CoV-2/FLU/RSV plus assay is intended as an aid in the diagnosis of influenza from Nasopharyngeal swab specimens and should not be used as a sole basis for treatment. Nasal washings and aspirates are unacceptable for Xpert Xpress SARS-CoV-2/FLU/RSV testing.  Fact Sheet for  Patients: BloggerCourse.comhttps://www.fda.gov/media/152166/download  Fact Sheet for Healthcare Providers: SeriousBroker.ithttps://www.fda.gov/media/152162/download  This test is not yet approved or cleared by the [place] FDA and has been authorized for detection and/or diagnosis of SARS-CoV-2 by FDA under an Emergency Use Authorization (EUA). This EUA will remain in effect (meaning this test can be used) for the duration of the COVID-19 declaration under Section 564(b)(1) of the Act, 21 U.S.C. section 360bbb-3(b)(1), unless the authorization is terminated or revoked.  Performed at [facility], [address] [place], [place] [id number]      Radiological Exams on Admission: CT Head Wo Contrast  Result Date: 07/28/2020 CLINICAL DATA:  70 year old male status post cardiac arrest. Headache. Planned heparin therapy. EXAM: CT HEAD WITHOUT CONTRAST TECHNIQUE:  Contiguous axial images were obtained from the base of the skull through the vertex without intravenous contrast. COMPARISON:  None. FINDINGS: Brain: Cerebral volume is within normal limits for age. No midline shift, ventriculomegaly, mass effect, evidence of mass lesion, intracranial hemorrhage or evidence of cortically based acute infarction. Gray-white matter differentiation is within normal limits throughout the brain. Vascular: Mild Calcified atherosclerosis at the skull base. No suspicious intracranial vascular hyperdensity. Skull: No acute osseous abnormality identified. Sinuses/Orbits: Moderate anterior ethmoid but elsewhere minimal to mild sinus mucosal thickening. No sinus fluid levels. Hyperplastic frontal sinuses. Mastoids are clear. Other: Visualized orbits and scalp soft tissues are within normal limits. IMPRESSION: 1. Normal for age non contrast CT appearance of the brain. Negative for acute intracranial hemorrhage. This was discussed by telephone with Dr. Shaune Pollack on 07/28/2020 at 08:58 . 2. Mild to moderate ethmoid sinus disease.  Electronically Signed   By: Odessa Fleming M.D.   On: 07/28/2020 09:00   DG Chest Portable 1 View  Result Date: 07/28/2020 CLINICAL DATA:  Shortness of breath.  Cardiac arrest. EXAM: PORTABLE CHEST 1 VIEW COMPARISON:  CT report 11/12/2002. FINDINGS: Mild prominence of the superior mediastinum noted. This may be secondary to AP technique. Follow-up PA and lateral chest x-ray suggested. Heart size normal. Low lung volumes. Very mild bilateral interstitial prominence. Mild interstitial edema and/or pneumonitis can not be excluded. No pleural effusion or pneumothorax. No acute bony abnormality. IMPRESSION: 1. Mild prominence of the superior mediastinum. This may be secondary to AP technique. Follow-up PA and lateral chest x-ray suggested. Heart size normal. 2. Low lung volumes. Very mild bilateral interstitial prominence. Mild interstitial edema and/or pneumonitis can not be excluded. Electronically Signed   By: Maisie Fus  Register   On: 07/28/2020 08:52     EKG: I have personally reviewed.  Sinus rhythm, QTC 484, ST elevation in inferior leads and ST depression in precordial leads.  Assessment/Plan Principal Problem:   STEMI involving right coronary artery (HCC) Active Problems:   COPD (chronic obstructive pulmonary disease) (HCC)   HLD (hyperlipidemia)   Leukocytosis   Cardiac arrest (HCC)   STEMI involving right coronary artery (HCC): trop 241. Pt underwent successful PCI and stent with overlapping stents to fix the mid RCA improvement from 0 to TIMI-3 flow.  Patient has some mild chest pain currently.  - admit to progressive unit as inpatient - on Angiomax currently - prn Nitroglycerin, - Aspirin, lipitor  - Risk factor stratification: will check FLP and A1C  - check UDS - 2d echo - f/u card recommendations  COPD (chronic obstructive pulmonary disease) (HCC) -Bronchodilators as needed Mucinex  HLD (hyperlipidemia) -Lipitor  Leukocytosis: WBC 14.6, no fever.  No source of infection  identified.  Likely reactive -Follow-up with CBC  Cardiac arrest [facility]): Due to STEMI.  CT head negative for acute intracranial abnormalities.  No focal deficit on physical examination -Cardiac monitor   DVT ppx: on Angiomax currently Code Status: Full code Family Communication: not done, no family member is at bed side.    Disposition Plan:  Anticipate discharge back to previous environment Consults called:  Dr. Juliann Pares of card Admission status:     SDU/inpation        Status is: Inpatient  Remains inpatient appropriate because:Inpatient level of care appropriate due to severity of illness   Dispo: The patient is from: Home              Anticipated d/c is to: Home  Anticipated d/c date is: 2 days              Patient currently is not medically stable to d/c.          Date of Service 07/28/2020    Lorretta Harp [facility]   If 7PM-7AM, please contact night-coverage [email or web address] 07/28/2020, 1:27 PM

## 2020-07-28 NOTE — CV Procedure (Signed)
Brief STEMI note Patient had a witnessed arrest in the field Short course of CPR Initial rhythm was V. Fib Patient was able to regain spontaneous reliable rhythm which was sinus Patient EKG in emergency room had ST elevation inferiorly reciprocal depressions laterally Patient had a delay and PCI STEMI because he needed to be evaluated for CNS issues including a CT of his head Cardiac cath Patient had depressed left ventricular function inferior hypokinesis EF of around 40% LAD left main was large and had mild irregularities Circumflex was large complex with multiple bifurcation lesions proximal and within both branches very large vessel giving 3 flow RCA was large vessel as well with occlusion in the midsegment which appeared to be the RA Successful PCI and stent with overlapping stents to fix the mid RCA improvement from 0 to TIMI-3 flow 100% down to 0% Patient was maintained on Angiomax for an additional 4 hours she is on Brilinta and aspirin Patient was transported to the ICU for care Right groin was sealed with minx ICU Dr. Belia Heman was informed and consulted Patient to be placed on hospitalist service Dr. Clyde Lundborg Call has been made to the family his daughter in [place] Patient hemodynamically stable blood pressure was 117/78 heart rate of 82 respirate of 16 afebrile Patient slightly confused but alert slightly disoriented

## 2020-07-28 NOTE — ED Triage Notes (Signed)
Pt from work via [employer] with c/o cardiac arrest. Pt v-fib on monitor for fire department- pt shocked three times before ems arrived. upon ems arrival pt in svt rhythm. CBG WNL for ems. 2mg  versed given in route due to combativeness. Pt now currentlylethargic, but talking and resonding to MD. MD at bedside

## 2020-07-28 NOTE — ED Provider Notes (Addendum)
[facility] Emergency Department Provider Note  ____________________________________________   None    (approximate)  I have reviewed the triage vital signs and the nursing notes.   HISTORY  Chief Complaint post-arrest    HPI Andrew Potts is a 70 y.o. male with h/o COPD here with cardiac arrest.    Reportedly showed up to work this morning.  He reportedly did not have any complaints.  He then went unconscious.  Per report, he was found pulseless and CPR was begun.  Fire department states that he was in V. fib on their arrival and he was given 3 cardioversion shocks with return of circulation.  He initially had a King airway in place, but became more awake after return of circulation and has been confused but alert since then.  He does not know what happened.  He states his chest feels mildly tender, denies any other complaints.  No tearing back pain.  Denies any focal numbness or weakness.  Denies any drug use.  He remains somewhat confused, limiting remainder of history.  Level 5 caveat invoked as remainder of history, ROS, and physical exam limited due to patient's confusion.        Past Medical History:  Diagnosis Date  . Arthritis   . COPD (chronic obstructive pulmonary disease) [facility])     Patient Active Problem List   Diagnosis Date Noted  . Elevated LDL cholesterol level 09/16/2019  . Chronic active hepatitis (HCC) 03/11/2019  . Erectile dysfunction 02/04/2019  . COPD (chronic obstructive pulmonary disease) (HCC) 02/04/2019  . Nicotine dependence, cigarettes, w unsp disorders 02/04/2019  . White coat syndrome without diagnosis of hypertension 02/04/2019    Past Surgical History:  Procedure Laterality Date  . ABDOMINAL EXPLORATION SURGERY      Prior to Admission medications   Medication Sig Start Date End Date Taking? Authorizing Provider  meloxicam (MOBIC) 15 MG tablet Take 1 tablet (15 mg total) by mouth daily. 01/11/20   [name],  Corrie Dandy T, NP  PROAIR HFA 108 (90 Base) MCG/ACT inhaler Inhale 2 puffs into the lungs every 4 (four) hours as needed. 06/24/20   [name], Corrie Dandy T, NP  sildenafil (REVATIO) 20 MG tablet Take 1 tablet (20 mg total) by mouth as needed. Daily only 05/03/20   [name], Corrie Dandy T, NP  SYMBICORT 160-4.5 MCG/ACT inhaler Inhale 2 puffs into the lungs 2 (two) times daily. 04/28/20   [name], NP    Allergies Codeine, Codone [hydrocodone], and Levaquin [levofloxacin]  Family History  Problem Relation Age of Onset  . Alcohol abuse Father   . Alcohol abuse Brother   . Aneurysm Brother     Social History Social History   Tobacco Use  . Smoking status: Current Every Day Smoker    Packs/day: 0.50    Years: 47.00    Pack years: 23.50  . Smokeless tobacco: Never Used  Vaping Use  . Vaping Use: Never used  Substance Use Topics  . Alcohol use: Not Currently  . Drug use: Never    Review of Systems  Review of Systems  Constitutional: Negative for chills, fatigue and fever.  HENT: Negative for sore throat.   Respiratory: Positive for chest tightness. Negative for shortness of breath.   Cardiovascular: Positive for chest pain.  Gastrointestinal: Negative for abdominal pain.  Genitourinary: Negative for flank pain.  Musculoskeletal: Negative for neck pain.  Skin: Negative for rash and wound.  Allergic/Immunologic: Negative for immunocompromised state.  Neurological: Positive for syncope and  weakness. Negative for numbness.  Hematological: Does not bruise/bleed easily.  All other systems reviewed and are negative.    ____________________________________________  PHYSICAL EXAM:      VITAL SIGNS: ED Triage Vitals  Enc Vitals Group     BP      Pulse      Resp      Temp      Temp src      SpO2      Weight      Height      Head Circumference      Peak Flow      Pain Score      Pain Loc      Pain Edu?      Excl. in GC?      Physical Exam Vitals and nursing note  reviewed.  Constitutional:      General: He is not in acute distress.    Appearance: He is well-developed.  HENT:     Head: Normocephalic and atraumatic.  Eyes:     Conjunctiva/sclera: Conjunctivae normal.  Cardiovascular:     Rate and Rhythm: Normal rate and regular rhythm.     Heart sounds: Normal heart sounds. No murmur heard. No friction rub.     Comments: Pulses are 2+ and symmetric bilateral upper and lower extremities. Pulmonary:     Effort: Pulmonary effort is normal. No respiratory distress.     Breath sounds: Normal breath sounds. No wheezing or rales.  Abdominal:     General: There is no distension.     Palpations: Abdomen is soft.     Tenderness: There is no abdominal tenderness.  Musculoskeletal:     Cervical back: Neck supple.  Skin:    General: Skin is warm.     Capillary Refill: Capillary refill takes less than 2 seconds.  Neurological:     Mental Status: He is alert. He is disoriented.     Motor: No abnormal muscle tone.     Comments: Oriented to person only, not place or time.  Moves all extremities with 5 and 5 strength.  Normal sensation to light touch.  Gait deferred.  Slightly confused.       ____________________________________________   LABS (all labs ordered are listed, but only abnormal results are displayed)  Labs Reviewed  CBC WITH DIFFERENTIAL/PLATELET - Abnormal; Notable for the following components:      Result Value   WBC 14.6 (*)    Neutro Abs 9.0 (*)    Monocytes Absolute 1.1 (*)    Abs Immature Granulocytes 0.29 (*)    All other components within normal limits  BLOOD GAS, VENOUS - Abnormal; Notable for the following components:   pO2, Ven 50.0 (*)    All other components within normal limits  RESP PANEL BY RT-PCR (FLU A&B, COVID) ARPGX2  ETHANOL  COMPREHENSIVE METABOLIC PANEL  MAGNESIUM  BRAIN NATRIURETIC PEPTIDE  URINE DRUG SCREEN, QUALITATIVE ([facility] ONLY)  APTT  PROTIME-INR  HEPARIN LEVEL (UNFRACTIONATED)  CBG MONITORING,  ED  TROPONIN I (HIGH SENSITIVITY)    ____________________________________________  EKG: Normal sinus rhythm, ventricular rate 101.  QRS 95, QTc 44.  Subtle ST elevations noted in lead III.  Subtle flattening in 2 and aVF, with ST depressions in the anterior precordial leads, most notably V2 through V4. ________________________________________  RADIOLOGY All imaging, including plain films, CT scans, and ultrasounds, independently reviewed by me, and interpretations confirmed via formal radiology reads.  ED MD interpretation:   Chest x-ray: Clear, possible slight  mediastinal prominence though I suspect this is projection related.   CT head: Negative  Official radiology report(s): CT Head Wo Contrast  Result Date: 07/28/2020 CLINICAL DATA:  70 year old male status post cardiac arrest. Headache. Planned heparin therapy. EXAM: CT HEAD WITHOUT CONTRAST TECHNIQUE: Contiguous axial images were obtained from the base of the skull through the vertex without intravenous contrast. COMPARISON:  None. FINDINGS: Brain: Cerebral volume is within normal limits for age. No midline shift, ventriculomegaly, mass effect, evidence of mass lesion, intracranial hemorrhage or evidence of cortically based acute infarction. Gray-white matter differentiation is within normal limits throughout the brain. Vascular: Mild Calcified atherosclerosis at the skull base. No suspicious intracranial vascular hyperdensity. Skull: No acute osseous abnormality identified. Sinuses/Orbits: Moderate anterior ethmoid but elsewhere minimal to mild sinus mucosal thickening. No sinus fluid levels. Hyperplastic frontal sinuses. Mastoids are clear. Other: Visualized orbits and scalp soft tissues are within normal limits. IMPRESSION: 1. Normal for age non contrast CT appearance of the brain. Negative for acute intracranial hemorrhage. This was discussed by telephone with Dr. Shaune Pollack on 07/28/2020 at 08:58 . 2. Mild to moderate ethmoid sinus  disease. Electronically Signed   By: Odessa Fleming M.D.   On: 07/28/2020 09:00   DG Chest Portable 1 View  Result Date: 07/28/2020 CLINICAL DATA:  Shortness of breath.  Cardiac arrest. EXAM: PORTABLE CHEST 1 VIEW COMPARISON:  CT report 11/12/2002. FINDINGS: Mild prominence of the superior mediastinum noted. This may be secondary to AP technique. Follow-up PA and lateral chest x-ray suggested. Heart size normal. Low lung volumes. Very mild bilateral interstitial prominence. Mild interstitial edema and/or pneumonitis can not be excluded. No pleural effusion or pneumothorax. No acute bony abnormality. IMPRESSION: 1. Mild prominence of the superior mediastinum. This may be secondary to AP technique. Follow-up PA and lateral chest x-ray suggested. Heart size normal. 2. Low lung volumes. Very mild bilateral interstitial prominence. Mild interstitial edema and/or pneumonitis can not be excluded. Electronically Signed   By: Maisie Fus  Register   On: 07/28/2020 08:52    ____________________________________________  PROCEDURES   Procedure(s) performed (including Critical Care):  .Critical Care Performed by: Shaune Pollack, MD Authorized by: Shaune Pollack, MD   Critical care provider statement:    Critical care time (minutes):  35   Critical care time was exclusive of:  Separately billable procedures and treating other patients and teaching time   Critical care was necessary to treat or prevent imminent or life-threatening deterioration of the following conditions:  Cardiac failure, circulatory failure and respiratory failure   Critical care was time spent personally by me on the following activities:  Development of treatment plan with patient or surrogate, discussions with consultants, evaluation of patient's response to treatment, examination of patient, obtaining history from patient or surrogate, ordering and performing treatments and interventions, ordering and review of laboratory studies, ordering and  review of radiographic studies, pulse oximetry, re-evaluation of patient's condition and review of old charts   I assumed direction of critical care for this patient from another provider in my specialty: no    .1-3 Lead EKG Interpretation Performed by: Shaune Pollack, MD Authorized by: Shaune Pollack, MD     Interpretation: abnormal     ECG rate:  90-120   ECG rate assessment: tachycardic     Rhythm: sinus rhythm     Ectopy: PVCs     Conduction: normal   Comments:     Indication: VFib arrest    ____________________________________________  INITIAL IMPRESSION / MDM / ASSESSMENT AND PLAN /  ED COURSE  As part of my medical decision making, I reviewed the following data within the electronic MEDICAL RECORD NUMBER Nursing notes reviewed and incorporated, Old chart reviewed, Notes from prior ED visits, and  Controlled Substance Database       *Andrew Potts was evaluated in Emergency Department on 07/28/2020 for the symptoms described in the history of present illness. He was evaluated in the context of the global COVID-19 pandemic, which necessitated consideration that the patient might be at risk for infection with the SARS-CoV-2 virus that causes COVID-19. Institutional protocols and algorithms that pertain to the evaluation of patients at risk for COVID-19 are in a state of rapid change based on information released by regulatory bodies including the CDC and federal and state organizations. These policies and algorithms were followed during the patient's care in the ED.  Some ED evaluations and interventions may be delayed as a result of limited staffing during the pandemic.*     Medical Decision Making: 70 year old male here with reported witnessed V. fib arrest.  Patient is in sinus rhythm/sinus tachycardia on arrival here.  EKG obtained immediately on arrival, does show concern for ST elevation in lead III, reciprocal is in the anterior precordial leads.  It does not quite meet STEMI  criteria, but I immediately discussed with both Dr. [name] as well as Dr. Juliann Pares.  Patient is confused and given unknown etiology for his arrest with confusion, he was taken for stat CT head which shows no acute abnormality.  This was obtained prior to giving aspirin and heparin, which I suspect patient needed for possible STEMI versus NSTEMI.  Based on presentation and absence of alternative sources, Dr. Juliann Pares recommended that we activated as a code STEMI.  Initial time to Cath Lab was delayed secondary to unclear presentation and need for CT imaging and further monitoring.  Lab work is pending.  Of note, chest x-ray shows possible mediastinal prominence.  The patient has symmetric pulses and no sharp or tearing chest pain.  I have a low suspicion for dissection but would consider further evaluation if cardiac catheterization is unremarkable.  Will be admitted to cardiology/medicine  ____________________________________________  FINAL CLINICAL IMPRESSION(S) / ED DIAGNOSES  Final diagnoses:  Cardiac arrest (HCC)  Abnormal EKG  Cardiac ischemia     MEDICATIONS GIVEN DURING THIS VISIT:  Medications  heparin bolus via infusion 4,000 Units (4,000 Units Intravenous Bolus from Bag 07/28/20 0911)    Followed by  heparin ADULT infusion 100 units/mL (25000 units/240mL) (950 Units/hr Intravenous New Bag/Given 07/28/20 0911)  sodium chloride flush (NS) 0.9 % injection 3 mL ( Intravenous Automatically Held 08/05/20 2200)  ondansetron (ZOFRAN) injection (4 mg Intravenous Given 07/28/20 0935)  aspirin chewable tablet 324 mg (324 mg Oral Given 07/28/20 0903)  0.9 %  sodium chloride infusion ( Intravenous New Bag/Given 07/28/20 0907)     ED Discharge Orders    None       Note:  This document was prepared using Dragon voice recognition software and may include unintentional dictation errors.   Shaune Pollack, MD 07/28/20 [id number]    Shaune Pollack, MD 07/28/20 [phone]

## 2020-07-28 NOTE — Telephone Encounter (Signed)
PA denied.

## 2020-07-28 NOTE — ED Notes (Signed)
CORRECTION  CODE  STEMI  CALLED  TO  [phone]

## 2020-07-28 NOTE — ED Notes (Signed)
Pt to cath lab at 0930

## 2020-07-28 NOTE — Consult Note (Signed)
ANTICOAGULATION CONSULT NOTE - Initial Consult  Pharmacy Consult for Heparin drip Indication: chest pain/ACS  Allergies  Allergen Reactions  . Codeine   . Codone [Hydrocodone] Other (See Comments)    Abdominal pain  . Levaquin [Levofloxacin] Swelling    Patient Measurements: Height: 5\' 7"  (170.2 cm) Weight: 79.4 kg (175 lb) IBW/kg (Calculated) : 66.1 Heparin Dosing Weight: 79.4kg  Vital Signs: Temp: 97.8 F (36.6 C) (01/13 0832) Temp Source: Oral (01/13 0832) BP: 111/67 (01/13 0832) Pulse Rate: 102 (01/13 0832)  Labs: No results for input(s): HGB, HCT, PLT, APTT, LABPROT, INR, HEPARINUNFRC, HEPRLOWMOCWT, CREATININE, CKTOTAL, CKMB, TROPONINIHS in the last 72 hours.  CrCl cannot be calculated (Patient's most recent lab result is older than the maximum 21 days allowed.).   Medical History: Past Medical History:  Diagnosis Date  . Arthritis   . COPD (chronic obstructive pulmonary disease) (HCC)     Medications:  No PTA anticoagulation of record - pt recently started sildenafil  Assessment: 70 yo male with cardiac arrest at work, arriving via EMS with history of COPD and HLD.  Pharmacy has been consulted to initiate and monitor a heparin drip.  Goal of Therapy:  Heparin level 0.3-0.7 units/ml Monitor platelets by anticoagulation protocol: Yes   Plan:  Give 4000 units bolus x 1 Start heparin infusion at 950 units/hr Check anti-Xa level in 6 hours and daily while on heparin Continue to monitor H&H and platelets  Baseline CBC in process - baseline aPTT and INR ordered  79, PharmD, BCPS Clinical Pharmacist 07/28/2020 8:55 AM

## 2020-07-28 NOTE — ED Notes (Signed)
Dr. Callwood at bedside. °

## 2020-07-28 NOTE — ED Notes (Signed)
CODE  STEMI  CALLED  TO [phone]

## 2020-07-28 NOTE — ED Notes (Signed)
Pt transported to cath lab with ED Staff and Dr. Juliann Pares.

## 2020-07-28 NOTE — Progress Notes (Addendum)
  Chaplain responded to page from the pager. Chaplain responded to a code Stemi. Chaplain first spoke to staff at the front desk in the ED, to see if PT had any family or anyone with him. PT had no one with him at the moment. Chaplain notified the staff in the ED to page chaplain if anyone shows up for the PT. Chaplain spoke to PT briefly, but the PT seemed incoherent, and tired.

## 2020-07-29 ENCOUNTER — Encounter: Payer: Self-pay | Admitting: Internal Medicine

## 2020-07-29 DIAGNOSIS — I2111 ST elevation (STEMI) myocardial infarction involving right coronary artery: Principal | ICD-10-CM

## 2020-07-29 LAB — LIPID PANEL
Cholesterol: 161 mg/dL (ref 0–200)
HDL: 40 mg/dL — ABNORMAL LOW (ref >40)
LDL Cholesterol: 106 mg/dL — ABNORMAL HIGH (ref 0–99)
Total CHOL/HDL Ratio: 4 RATIO
Triglycerides: 74 mg/dL (ref <150)
VLDL: 15 mg/dL (ref 0–40)

## 2020-07-29 LAB — HEMOGLOBIN A1C
Hgb A1c MFr Bld: 5.2 % (ref 4.8–5.6)
Mean Plasma Glucose: 102.54 mg/dL

## 2020-07-29 LAB — TROPONIN I (HIGH SENSITIVITY)
Troponin I (High Sensitivity): 18515 ng/L (ref <18)
Troponin I (High Sensitivity): 13727 ng/L (ref <18)

## 2020-07-29 MED ORDER — TICAGRELOR 90 MG PO TABS
90.0000 mg | ORAL_TABLET | Freq: Two times a day (BID) | ORAL | Status: DC
  Administered 2020-07-29 – 2020-07-30 (×3): 90 mg via ORAL
  Filled 2020-07-29 (×3): qty 1

## 2020-07-29 MED ORDER — IPRATROPIUM-ALBUTEROL 0.5-2.5 (3) MG/3ML IN SOLN: 3.0000 mL | RESPIRATORY_TRACT | Status: DC | PRN

## 2020-07-29 MED ORDER — SODIUM BICARBONATE 650 MG PO TABS
650.0000 mg | ORAL_TABLET | Freq: Two times a day (BID) | ORAL | Status: DC
  Administered 2020-07-29 – 2020-07-30 (×2): 650 mg via ORAL
  Filled 2020-07-29 (×2): qty 1

## 2020-07-29 MED ORDER — ASPIRIN EC 81 MG PO TBEC
81.0000 mg | DELAYED_RELEASE_TABLET | Freq: Every day | ORAL | Status: DC
  Administered 2020-07-29 – 2020-07-30 (×2): 81 mg via ORAL
  Filled 2020-07-29 (×2): qty 1

## 2020-07-29 NOTE — Progress Notes (Signed)
PROGRESS NOTE    Andrew Potts  OYD:[id number] DOB: 1951/04/21 DOA: 07/28/2020 PCP: [name], NP   Brief Narrative:  This 70 years old male with PMH significant for hyperlipidemia, COPD, tobacco abuse, arthritis who presents in cardiac arrest.  Patient went to the work yesterday and became unconscious.  He was pulseless and CPR was started,  fire department states he was in V. fib on arrival,  Patient was given 3 cardioversion shocks with return of circulation.  Patient became more awake after return of circulation but has been confused.  Patient was found to have STEMI on EKG and negative CT head in the ED.  Patient underwent emergent cardiac cath with a stent placement by Dr. Juliann Pares.  He is found to have two-vessel coronary artery disease,  Cardiology  recommended staged PCI.    Assessment & Plan:   Principal Problem:   STEMI involving right coronary artery (HCC) Active Problems:   COPD (chronic obstructive pulmonary disease) (HCC)   HLD (hyperlipidemia)   Leukocytosis   Cardiac arrest (HCC)   STEMI involving right coronary artery (HCC): trop 241.  Patient presented as cardiac arrest. He underwent CPR and defibrillation. He is found to have STEMI underwent emergent left heart cath. Pt underwent successful PCI and stent with overlapping stents to fix the mid RCA improvement from 0 to TIMI-3 flow.    - admitted to progressive unit as inpatient -  Was on Angiomax -Continue prn Nitroglycerin, -Continue aspirin, Brilinta,  Lipitor. -Angiomax has been discontinued. -Risk factor stratification: will check FLP and A1C  - check UDS - 2d echo Cardiology recommended staged PCI for left circumflex. -Will need cardiac rehab following discharge.  COPD (chronic obstructive pulmonary disease) (HCC) -Continue bronchodilators as needed,  Mucinex  HLD (hyperlipidemia) -Continue Lipitor  Leukocytosis: WBC 14.6, no fever.  No source of infection identified.  Likely  reactive -Follow-up with CBC  Cardiac arrest [facility]): Due to STEMI.  CT head negative for acute intracranial abnormalities.  No focal deficit on physical examination -Cardiac monitor.    DVT prophylaxis: Heparin Code Status: Full code Family Communication: No family at bedside Disposition Plan:   Status is: Inpatient  Remains inpatient appropriate because:Inpatient level of care appropriate due to severity of illness   Dispo: The patient is from: Home              Anticipated d/c is to: Home              Anticipated d/c date is: 2 days              Patient currently is not medically stable to d/c.   Consultants:   Cardiology  Procedures: PCI with stents.  Antimicrobials:  Anti-infectives (From admission, onward)   None      Subjective: Patient was seen and examined at bedside.  Overnight events noted.  Patient was sitting in the bed,  having breakfast,  denies any chest pain but reports soreness in the chest because of CPR.  Objective: Vitals:   07/29/20 0734 07/29/20 1110 07/29/20 1437 07/29/20 1545  BP:  123/70  105/69  Pulse:  63  66  Resp:  15  15  Temp:  98.8 F (37.1 C)  98.4 F (36.9 C)  TempSrc:    Oral  SpO2: 93% 94% 93% 95%  Weight:      Height:        Intake/Output Summary (Last 24 hours) at 07/29/2020 1600 Last data filed at 07/29/2020 1145 Gross per  24 hour  Intake 3 ml  Output 1200 ml  Net -1197 ml   Filed Weights   07/28/20 0834 07/29/20 0424  Weight: 79.4 kg 79.2 kg    Examination:  General exam: Appears calm and comfortable, not in any acute distress. Respiratory system: Clear to auscultation. Respiratory effort normal. Cardiovascular system: S1 & S2 heard, RRR. No JVD, murmurs, rubs, gallops or clicks. No pedal edema. Gastrointestinal system: Abdomen is nondistended, soft and nontender. No organomegaly or masses felt. Normal bowel sounds heard. Central nervous system: Alert and oriented. No focal neurological  deficits. Extremities: Symmetric 5 x 5 power. Skin: No rashes, lesions or ulcers Psychiatry: Judgement and insight appear normal. Mood & affect appropriate.     Data Reviewed: I have personally reviewed following labs and imaging studies  CBC: Recent Labs  Lab 07/28/20 0833  WBC 14.6*  NEUTROABS 9.0*  HGB 16.2  HCT 47.2  MCV 98.1  PLT 200   Basic Metabolic Panel: Recent Labs  Lab 07/28/20 0833  NA 136  K 3.8  CL 104  CO2 16*  GLUCOSE 213*  BUN 12  CREATININE 0.71  CALCIUM 8.5*  MG 1.9   GFR: Estimated Creatinine Clearance: 83.8 mL/min (by C-G formula based on SCr of 0.71 mg/dL). Liver Function Tests: Recent Labs  Lab 07/28/20 0833  AST 43*  ALT 31  ALKPHOS 62  BILITOT 0.7  PROT 6.5  ALBUMIN 3.6   No results for input(s): LIPASE, AMYLASE in the last 168 hours. No results for input(s): AMMONIA in the last 168 hours. Coagulation Profile: Recent Labs  Lab 07/28/20 0918  INR 1.1   Cardiac Enzymes: No results for input(s): CKTOTAL, CKMB, CKMBINDEX, TROPONINI in the last 168 hours. BNP (last 3 results) No results for input(s): PROBNP in the last 8760 hours. HbA1C: Recent Labs    07/29/20 0651  HGBA1C 5.2   CBG: Recent Labs  Lab 07/28/20 1212  GLUCAP 120*   Lipid Profile: Recent Labs    07/29/20 0651  CHOL 161  HDL 40*  LDLCALC 106*  TRIG 74  CHOLHDL 4.0   Thyroid Function Tests: No results for input(s): TSH, T4TOTAL, FREET4, T3FREE, THYROIDAB in the last 72 hours. Anemia Panel: No results for input(s): VITAMINB12, FOLATE, FERRITIN, TIBC, IRON, RETICCTPCT in the last 72 hours. Sepsis Labs: No results for input(s): PROCALCITON, LATICACIDVEN in the last 168 hours.  Recent Results (from the past 240 hour(s))  Resp Panel by RT-PCR (Flu A&B, Covid) Nasopharyngeal Swab     Status: None   Collection Time: 07/28/20  8:33 AM   Specimen: Nasopharyngeal Swab; Nasopharyngeal(NP) swabs in vial transport medium  Result Value Ref Range Status    SARS Coronavirus 2 by RT PCR NEGATIVE NEGATIVE Final    Comment: (NOTE) SARS-CoV-2 target nucleic acids are NOT DETECTED.  The SARS-CoV-2 RNA is generally detectable in upper respiratory specimens during the acute phase of infection. The lowest concentration of SARS-CoV-2 viral copies this assay can detect is 138 copies/mL. A negative result does not preclude SARS-Cov-2 infection and should not be used as the sole basis for treatment or other patient management decisions. A negative result may occur with  improper specimen collection/handling, submission of specimen other than nasopharyngeal swab, presence of viral mutation(s) within the areas targeted by this assay, and inadequate number of viral copies(<138 copies/mL). A negative result must be combined with clinical observations, patient history, and epidemiological information. The expected result is Negative.  Fact Sheet for Patients:  BloggerCourse.com  Fact Sheet for  Healthcare Providers:  SeriousBroker.it  This test is no t yet approved or cleared by the [place] and  has been authorized for detection and/or diagnosis of SARS-CoV-2 by FDA under an Emergency Use Authorization (EUA). This EUA will remain  in effect (meaning this test can be used) for the duration of the COVID-19 declaration under Section 564(b)(1) of the Act, 21 U.S.C.section 360bbb-3(b)(1), unless the authorization is terminated  or revoked sooner.       Influenza A by PCR NEGATIVE NEGATIVE Final   Influenza B by PCR NEGATIVE NEGATIVE Final    Comment: (NOTE) The Xpert Xpress SARS-CoV-2/FLU/RSV plus assay is intended as an aid in the diagnosis of influenza from Nasopharyngeal swab specimens and should not be used as a sole basis for treatment. Nasal washings and aspirates are unacceptable for Xpert Xpress SARS-CoV-2/FLU/RSV testing.  Fact Sheet for  Patients: BloggerCourse.com  Fact Sheet for Healthcare Providers: SeriousBroker.it  This test is not yet approved or cleared by the [place] FDA and has been authorized for detection and/or diagnosis of SARS-CoV-2 by FDA under an Emergency Use Authorization (EUA). This EUA will remain in effect (meaning this test can be used) for the duration of the COVID-19 declaration under Section 564(b)(1) of the Act, 21 U.S.C. section 360bbb-3(b)(1), unless the authorization is terminated or revoked.  Performed at [facility], [address] [place], [place] [address]   MRSA PCR Screening     Status: None   Collection Time: 07/28/20 12:45 PM   Specimen: Nasal Mucosa; Nasopharyngeal  Result Value Ref Range Status   MRSA by PCR NEGATIVE NEGATIVE Final    Comment:        The GeneXpert MRSA Assay (FDA approved for NASAL specimens only), is one component of a comprehensive MRSA colonization surveillance program. It is not intended to diagnose MRSA infection nor to guide or monitor treatment for MRSA infections. Performed at [facility], [address], [place], [place] [address]     Radiology Studies: CT Head Wo Contrast  Result Date: 07/28/2020 CLINICAL DATA:  70 year old male status post cardiac arrest. Headache. Planned heparin therapy. EXAM: CT HEAD WITHOUT CONTRAST TECHNIQUE: Contiguous axial images were obtained from the base of the skull through the vertex without intravenous contrast. COMPARISON:  None. FINDINGS: Brain: Cerebral volume is within normal limits for age. No midline shift, ventriculomegaly, mass effect, evidence of mass lesion, intracranial hemorrhage or evidence of cortically based acute infarction. Gray-white matter differentiation is within normal limits throughout the brain. Vascular: Mild Calcified atherosclerosis at the skull base. No suspicious intracranial vascular hyperdensity. Skull: No  acute osseous abnormality identified. Sinuses/Orbits: Moderate anterior ethmoid but elsewhere minimal to mild sinus mucosal thickening. No sinus fluid levels. Hyperplastic frontal sinuses. Mastoids are clear. Other: Visualized orbits and scalp soft tissues are within normal limits. IMPRESSION: 1. Normal for age non contrast CT appearance of the brain. Negative for acute intracranial hemorrhage. This was discussed by telephone with Dr. Shaune Pollack on 07/28/2020 at 08:58 . 2. Mild to moderate ethmoid sinus disease. Electronically Signed   By: Odessa Fleming M.D.   On: 07/28/2020 09:00   CARDIAC CATHETERIZATION  Result Date: 07/28/2020  Prox RCA to Mid RCA lesion is 100% stenosed.  Prox Cx lesion is 75% stenosed.  Mid Cx lesion is 90% stenosed.  2nd [name] is 90% stenosed.  Mid Cx to Dist Cx lesion is 90% stenosed.  1st Diag lesion is 50% stenosed.  Prox LAD to Mid LAD lesion is 40%  stenosed.  Successful PCI and stent with DES mid to distal RCA  Conclusion Multivessel coronary artery disease on diagnostic cardiac cath during STEMI Mid RCA infarct-related vessel Circumflex was large but had complex Trifurcation disease LAD was large relatively free of disease Left main large relatively free of disease LV had mild reduced left ventricular function with inferior hypo-EF around 40 to 45% Intervention Successful PCI and stent of mid RCA with overlapping DES stents to the distal RCA TIMI-3 flow was restored from TIMI 0 Placed on aspirin and Brilinta Angiomax for an additional 4 hours Transferred to ICU Minx deployed No complications   DG Chest Portable 1 View  Result Date: 07/28/2020 CLINICAL DATA:  Shortness of breath.  Cardiac arrest. EXAM: PORTABLE CHEST 1 VIEW COMPARISON:  CT report 11/12/2002. FINDINGS: Mild prominence of the superior mediastinum noted. This may be secondary to AP technique. Follow-up PA and lateral chest x-ray suggested. Heart size normal. Low lung volumes. Very mild bilateral  interstitial prominence. Mild interstitial edema and/or pneumonitis can not be excluded. No pleural effusion or pneumothorax. No acute bony abnormality. IMPRESSION: 1. Mild prominence of the superior mediastinum. This may be secondary to AP technique. Follow-up PA and lateral chest x-ray suggested. Heart size normal. 2. Low lung volumes. Very mild bilateral interstitial prominence. Mild interstitial edema and/or pneumonitis can not be excluded. Electronically Signed   By: Maisie Fus  Register   On: 07/28/2020 08:52    Scheduled Meds: . aspirin EC  81 mg Oral Daily  . atorvastatin  80 mg Oral Daily  . ipratropium-albuterol  3 mL Nebulization TID  . lisinopril  5 mg Oral Daily  . metoprolol tartrate  12.5 mg Oral BID  . mometasone-formoterol  2 puff Inhalation BID  . sodium bicarbonate  650 mg Oral BID  . sodium chloride flush  3 mL Intravenous Q12H  . ticagrelor  90 mg Oral BID   Continuous Infusions:   LOS: 1 day    Time spent: 35 mins    Jenine Krisher, MD [facility]   If 7PM-7AM, please contact night-coverage

## 2020-07-29 NOTE — Progress Notes (Signed)
CCMD called and reported 12 beats v tach.  Pt assessed and is calm and relaxed, did not feel anything.  Vs's at this time are bp of 113/64 with a map of 79, hr 64, and O2 sats 96%. On call notified.  Will cont to monitor.

## 2020-07-29 NOTE — Progress Notes (Signed)
[facility] Cardiology    SUBJECTIVE: Andrew Potts is a 70 year old male with a past medical history significant for COPD who presented to the ED on 07/28/20 following cardiac arrest.  Per EMS report, he was at work, passed out, and was found pulseless.  CPR was initiated and he was noted to be in ventricular fibrillation and given 3 shocks with ROSC.  ECG was concerning for inferior ST elevations with reciprocal changes in the anterior leads, and he underwent left heart catheterization with Dr. Juliann Pares.  LHC revealed an occluded RCA lesion that was successfully treated with two overlapping drug eluding stents.   Left heart catheterization on 07/28/20 revealed multivessel coronary artery disease with mid RCA being culprit lesion, treated with 2 overlapping drug eluding stents.  Circumflex was a large vessel with complex trifurcation disease. LAD and left main were normal.  LV systolic function was mildly reduced, EF estimated between 40-45%.   07/29/20: Andrew Potts is lying in bed, in no acute distress.  He admits to rib soreness, worse with palpation and certain movements.  He denies palpitations or heart racing.  He denies shortness of breath, lower extremity swelling, orthopnea, or PND.  He denies syncopal or presyncopal episodes.  Confusion/disorientation has resolved.  He reports a several year history of smoking 1/2 pack of cigarettes a day but is very motivated to quit.    Vitals:   07/28/20 2328 07/29/20 0424 07/29/20 0714 07/29/20 0734  BP: 108/64 104/67 103/62   Pulse: 69 70 66   Resp: 17 16 15    Temp: 99 F (37.2 C) 99.2 F (37.3 C) 99 F (37.2 C)   TempSrc: Oral Oral Oral   SpO2: 95% 95% 93% 93%  Weight:  79.2 kg    Height:         Intake/Output Summary (Last 24 hours) at 07/29/2020 0737 Last data filed at 07/29/2020 0130 Gross per 24 hour  Intake 100.76 ml  Output 700 ml  Net -599.24 ml      PHYSICAL EXAM  General: Well developed, well nourished, in no acute distress HEENT:   Normocephalic and atramatic Neck:  No JVD.  Lungs: Clear bilaterally to auscultation and percussion. Heart: HRRR . Normal S1 and S2 without gallops or murmurs.  Abdomen: Bowel sounds are positive, abdomen soft and non-tender  Msk:  Back normal.  Normal strength and tone for age. Extremities: No clubbing, cyanosis or edema.  Right femoral access site clean, dry, intact with no evidence of erythema, edema, or ecchymosis.   Neuro: Alert and oriented X 3. Psych:  Good affect, responds appropriately   LABS: Basic Metabolic Panel: Recent Labs    07/28/20 0833  NA 136  K 3.8  CL 104  CO2 16*  GLUCOSE 213*  BUN 12  CREATININE 0.71  CALCIUM 8.5*  MG 1.9   Liver Function Tests: Recent Labs    07/28/20 0833  AST 43*  ALT 31  ALKPHOS 62  BILITOT 0.7  PROT 6.5  ALBUMIN 3.6   No results for input(s): LIPASE, AMYLASE in the last 72 hours. CBC: Recent Labs    07/28/20 0833  WBC 14.6*  NEUTROABS 9.0*  HGB 16.2  HCT 47.2  MCV 98.1  PLT 200   Cardiac Enzymes: No results for input(s): CKTOTAL, CKMB, CKMBINDEX, TROPONINI in the last 72 hours. BNP: Invalid input(s): POCBNP D-Dimer: No results for input(s): DDIMER in the last 72 hours. Hemoglobin A1C: No results for input(s): HGBA1C in the last 72 hours. Fasting Lipid Panel:  No results for input(s): CHOL, HDL, LDLCALC, TRIG, CHOLHDL, LDLDIRECT in the last 72 hours. Thyroid Function Tests: No results for input(s): TSH, T4TOTAL, T3FREE, THYROIDAB in the last 72 hours.  Invalid input(s): FREET3 Anemia Panel: No results for input(s): VITAMINB12, FOLATE, FERRITIN, TIBC, IRON, RETICCTPCT in the last 72 hours.  CT Head Wo Contrast  Result Date: 07/28/2020 CLINICAL DATA:  70 year old male status post cardiac arrest. Headache. Planned heparin therapy. EXAM: CT HEAD WITHOUT CONTRAST TECHNIQUE: Contiguous axial images were obtained from the base of the skull through the vertex without intravenous contrast. COMPARISON:  None.  FINDINGS: Brain: Cerebral volume is within normal limits for age. No midline shift, ventriculomegaly, mass effect, evidence of mass lesion, intracranial hemorrhage or evidence of cortically based acute infarction. Gray-white matter differentiation is within normal limits throughout the brain. Vascular: Mild Calcified atherosclerosis at the skull base. No suspicious intracranial vascular hyperdensity. Skull: No acute osseous abnormality identified. Sinuses/Orbits: Moderate anterior ethmoid but elsewhere minimal to mild sinus mucosal thickening. No sinus fluid levels. Hyperplastic frontal sinuses. Mastoids are clear. Other: Visualized orbits and scalp soft tissues are within normal limits. IMPRESSION: 1. Normal for age non contrast CT appearance of the brain. Negative for acute intracranial hemorrhage. This was discussed by telephone with Dr. Shaune Pollack on 07/28/2020 at 08:58 . 2. Mild to moderate ethmoid sinus disease. Electronically Signed   By: Odessa Fleming M.D.   On: 07/28/2020 09:00   CARDIAC CATHETERIZATION  Result Date: 07/28/2020  Prox RCA to Mid RCA lesion is 100% stenosed.  Prox Cx lesion is 75% stenosed.  Mid Cx lesion is 90% stenosed.  2nd [name] is 90% stenosed.  Mid Cx to Dist Cx lesion is 90% stenosed.  1st Diag lesion is 50% stenosed.  Prox LAD to Mid LAD lesion is 40% stenosed.  Successful PCI and stent with DES mid to distal RCA  Conclusion Multivessel coronary artery disease on diagnostic cardiac cath during STEMI Mid RCA infarct-related vessel Circumflex was large but had complex Trifurcation disease LAD was large relatively free of disease Left main large relatively free of disease LV had mild reduced left ventricular function with inferior hypo-EF around 40 to 45% Intervention Successful PCI and stent of mid RCA with overlapping DES stents to the distal RCA TIMI-3 flow was restored from TIMI 0 Placed on aspirin and Brilinta Angiomax for an additional 4 hours Transferred to ICU Minx  deployed No complications   DG Chest Portable 1 View  Result Date: 07/28/2020 CLINICAL DATA:  Shortness of breath.  Cardiac arrest. EXAM: PORTABLE CHEST 1 VIEW COMPARISON:  CT report 11/12/2002. FINDINGS: Mild prominence of the superior mediastinum noted. This may be secondary to AP technique. Follow-up PA and lateral chest x-ray suggested. Heart size normal. Low lung volumes. Very mild bilateral interstitial prominence. Mild interstitial edema and/or pneumonitis can not be excluded. No pleural effusion or pneumothorax. No acute bony abnormality. IMPRESSION: 1. Mild prominence of the superior mediastinum. This may be secondary to AP technique. Follow-up PA and lateral chest x-ray suggested. Heart size normal. 2. Low lung volumes. Very mild bilateral interstitial prominence. Mild interstitial edema and/or pneumonitis can not be excluded. Electronically Signed   By: Maisie Fus  Register   On: 07/28/2020 08:52     Echo: pending   TELEMETRY: Normal sinus rhythm, rate in the 70s.   ASSESSMENT AND PLAN:  Principal Problem:   STEMI involving right coronary artery (HCC) Active Problems:   COPD (chronic obstructive pulmonary disease) (HCC)   HLD (hyperlipidemia)  Leukocytosis   Cardiac arrest (HCC)    1.  STEMI   -S/p PCI to the RCA with 2 drug-eluding stents  -Significant disease in the left circumflex as well; will review films this admission and determine if staged PCI is indicated either in an inpatient or outpatient setting   -Started on aspirin, Brilinta, and Angiomax; Angiomax has been discontinued   -Will continue with lisinopril 5mg  daily and metoprolol 12.5mg  BID   -Continue atorvastatin 80mg  daily   -Will need cardiac rehab following discharge   2.  COPD   -Continue current medication regimen   3.  Hyperlipidemia   -Atorvastatin 80mg  initiated this admission   4.  Tobacco abuse   -Smoking cessation encouraged   The history, physical exam findings, and plan of care were all  discussed with Dr. , and all decision making was made in collaboration.    PA-C 07/29/2020 7:37 AM

## 2020-07-29 NOTE — TOC Progression Note (Signed)
Transition of Care [facility]) - Progression Note    Patient Details  Name: ANDYN SALES MRN: [id number] Date of Birth: 04-21-1951  Transition of Care [facility]) CM/SW Contact  Hetty Ely, RN Phone Number: 07/29/2020, 1:39 PM  Clinical Narrative:   Spoke with patient at bedside, very pleasant patient lives alone now over 10 years. Drives, independent with ADL's to include shopping, getting medications at [facility] in [place].  Works at [employer], unloading trucks. Patient states he will quit job, too strenuous and move to senior apartments which is much cheaper. Patient agrees to have a HHN to visit to assess condition. Patient also agrees to me discussing his care with his daughter, states he does not want to move in with her, she lives in [place] with husband and kids, he prefers to stay here in [place]. Called and spoke with daughter informed her that father condition is stable at this time, and he has agreed to [facility] visits. Daughter receptive just want to make sure someone will check on patient.         Expected Discharge Plan and Services                                                 Social Determinants of Health (SDOH) Interventions    Readmission Risk Interventions No flowsheet data found.

## 2020-07-29 NOTE — Plan of Care (Signed)
  Problem: Clinical Measurements: Goal: Ability to maintain clinical measurements within normal limits will improve Outcome: Progressing   Problem: Elimination: Goal: Will not experience complications related to bowel motility Outcome: Progressing   Problem: Pain Managment: Goal: General experience of comfort will improve Outcome: Progressing   

## 2020-07-29 NOTE — Consult Note (Signed)
   Heart Failure Nurse Navigator Note  HFrEf 40 to 45% per ventriculogram.  Revealed inferior hypokinesis.  Echocardiogram results are pending at this time.  Comorbidities:  Hyperlipidemia COPD Arthritis Coronary artery disease-2 stents to the RCA  History of tobacco abuse   Medications:  Aspirin 81 mg daily  Atorvastatin 80 mg daily Lisinopril 5 mg daily Metoprolol tartrate 12.5 mg twice a day  Ticagrelor 90 mg twice a day  Labs:  Troponin 18,515, hemoglobin A1c 5.2, brain natruretic peptide 37.9, magnesium 1.9, sodium 136, potassium 3.8, chloride 104, CO2 16, BUN 12, creatinine 0.71, albumin 3.6, AST 43, ALT 31. Weight is 79.2 kg BMI 27.35 Blood pressure 123/70    Assessment:  General-he is awake and alert lying in bed, he is experiencing quite a bit of discomfort with movement or taking a deep breath.  HEENT-wears glasses, normal dentition  Cardiac-heart tones are regular rate and rhythm, no murmurs or gallops appreciated  Chest- breath sounds are clear to posterior auscultation  Abdomen-rounded soft nontender  Musculoskeletal-there is no lower extremity edema noted.  Psych-she is pleasant, talkative and makes good eye contact  Neurologic-moves all extremities, speech is clear.   Initial visit with patient.  Explained heart failure and how his heart attack affected the muscle of his heart.  He voices understanding  Discussed low-sodium diet, foods to eat and foods to avoid.  He said for breakfast he used to have 4 eggs and would have 4 to 5 pieces of bacon.  Explained that he could still do this but would have to do it in moderation.  Discussed be- coming pro at reading labels.   Also discussed the importance of fluid restriction, not taking anymore and then eight 8 ounce cups in 24 hours.  Was given living better with heart failure booklet along with his zone  Magnet and information about the heart failure clinic.   Tresa Endo RN [facility]

## 2020-07-30 LAB — BASIC METABOLIC PANEL
Anion gap: 8 (ref 5–15)
BUN: 13 mg/dL (ref 8–23)
CO2: 22 mmol/L (ref 22–32)
Calcium: 8.6 mg/dL — ABNORMAL LOW (ref 8.9–10.3)
Chloride: 106 mmol/L (ref 98–111)
Creatinine, Ser: 0.58 mg/dL — ABNORMAL LOW (ref 0.61–1.24)
GFR, Estimated: >60 mL/min (ref >60)
Glucose, Bld: 101 mg/dL — ABNORMAL HIGH (ref 70–99)
Potassium: 3.9 mmol/L (ref 3.5–5.1)
Sodium: 136 mmol/L (ref 135–145)

## 2020-07-30 LAB — CBC
HCT: 42.6 % (ref 39.0–52.0)
Hemoglobin: 15 g/dL (ref 13.0–17.0)
MCH: 33.4 pg (ref 26.0–34.0)
MCHC: 35.2 g/dL (ref 30.0–36.0)
MCV: 94.9 fL (ref 80.0–100.0)
Platelets: 223 10*3/uL (ref 150–400)
RBC: 4.49 MIL/uL (ref 4.22–5.81)
RDW: 12.5 % (ref 11.5–15.5)
WBC: 12.2 10*3/uL — ABNORMAL HIGH (ref 4.0–10.5)
nRBC: 0 % (ref 0.0–0.2)

## 2020-07-30 LAB — TROPONIN I (HIGH SENSITIVITY): Troponin I (High Sensitivity): 11207 ng/L (ref <18)

## 2020-07-30 LAB — PHOSPHORUS: Phosphorus: 2 mg/dL — ABNORMAL LOW (ref 2.5–4.6)

## 2020-07-30 LAB — MAGNESIUM: Magnesium: 2.1 mg/dL (ref 1.7–2.4)

## 2020-07-30 MED ORDER — METOPROLOL TARTRATE 25 MG PO TABS: 12.5000 mg | ORAL_TABLET | Freq: Two times a day (BID) | ORAL | 1 refills | Status: DC

## 2020-07-30 MED ORDER — LISINOPRIL 5 MG PO TABS: 5.0000 mg | ORAL_TABLET | Freq: Every day | ORAL | 1 refills | Status: DC

## 2020-07-30 MED ORDER — ASPIRIN 81 MG PO TBEC: 81.0000 mg | DELAYED_RELEASE_TABLET | Freq: Every day | ORAL | 11 refills | Status: AC

## 2020-07-30 MED ORDER — NITROGLYCERIN 0.4 MG SL SUBL: 0.4000 mg | SUBLINGUAL_TABLET | SUBLINGUAL | 12 refills | Status: DC | PRN

## 2020-07-30 MED ORDER — K PHOS MONO-SOD PHOS DI & MONO 155-852-130 MG PO TABS
250.0000 mg | ORAL_TABLET | Freq: Three times a day (TID) | ORAL | Status: DC
  Administered 2020-07-30: 250 mg via ORAL
  Filled 2020-07-30 (×3): qty 1

## 2020-07-30 MED ORDER — ATORVASTATIN CALCIUM 80 MG PO TABS: 80.0000 mg | ORAL_TABLET | Freq: Every day | ORAL | 1 refills | Status: DC

## 2020-07-30 MED ORDER — TICAGRELOR 90 MG PO TABS: 90.0000 mg | ORAL_TABLET | Freq: Two times a day (BID) | ORAL | 2 refills | Status: DC

## 2020-07-30 NOTE — Progress Notes (Signed)
Patient Name: Andrew Potts Date of Encounter: 07/30/2020  Hospital Problem List     Principal Problem:   STEMI involving right coronary artery [facility]) Active Problems:   COPD (chronic obstructive pulmonary disease) (HCC)   HLD (hyperlipidemia)   Leukocytosis   Cardiac arrest [facility])    Patient Profile     70 year old male with a past medical history significant for COPD who presented to the ED on 07/28/20 following cardiac arrest.  Per EMS report, he was at work, passed out, and was found pulseless.  CPR was initiated and he was noted to be in ventricular fibrillation and given 3 shocks with ROSC.  ECG was concerning for inferior ST elevations with reciprocal changes in the anterior leads, and he underwent left heart catheterization with Dr. Juliann Pares.  LHC revealed an occluded RCA lesion that was successfully treated with two overlapping drug eluding stents.   LV systolic function was mildly reduced, EF estimated between 40-45%.     Subjective   Still has chest wall pain but no evidence of ischemia.  We will continue with current regimen and discharge with medical management of his circumflex marginal lesion and consider staged PCI if he does not tolerate medical management.  Inpatient Medications    . aspirin EC  81 mg Oral Daily  . atorvastatin  80 mg Oral Daily  . lisinopril  5 mg Oral Daily  . metoprolol tartrate  12.5 mg Oral BID  . mometasone-formoterol  2 puff Inhalation BID  . phosphorus  250 mg Oral TID  . sodium bicarbonate  650 mg Oral BID  . sodium chloride flush  3 mL Intravenous Q12H  . ticagrelor  90 mg Oral BID    Vital Signs    Vitals:   07/29/20 1545 07/29/20 1948 07/30/20 0423 07/30/20 0725  BP: 105/69 121/73 108/67 121/75  Pulse: 66 69 64 75  Resp: 15 17 16 19   Temp: 98.4 F (36.9 C) 98.3 F (36.8 C) 98.6 F (37 C) 98.4 F (36.9 C)  TempSrc: Oral   Oral  SpO2: 95% 97% 94% 94%  Weight:   67.4 kg   Height:        Intake/Output Summary (Last  24 hours) at 07/30/2020 1052 Last data filed at 07/30/2020 1015 Gross per 24 hour  Intake 240 ml  Output 500 ml  Net -260 ml   Filed Weights   07/28/20 0834 07/29/20 0424 07/30/20 0423  Weight: 79.4 kg 79.2 kg 67.4 kg    Physical Exam    GEN: Well nourished, well developed, in no acute distress.  HEENT: normal.  Neck: Supple, no JVD, carotid bruits, or masses. Cardiac: RRR, no murmurs, rubs, or gallops. No clubbing, cyanosis, edema.  Radials/DP/PT 2+ and equal bilaterally.  Respiratory:  Respirations regular and unlabored, clear to auscultation bilaterally. GI: Soft, nontender, nondistended, BS + x 4. MS: no deformity or atrophy. Skin: warm and dry, no rash. Neuro:  Strength and sensation are intact. Psych: Normal affect.  Labs    CBC Recent Labs    07/28/20 0833 07/30/20 0558  WBC 14.6* 12.2*  NEUTROABS 9.0*  --   HGB 16.2 15.0  HCT 47.2 42.6  MCV 98.1 94.9  PLT 200 223   Basic Metabolic Panel Recent Labs    08/01/20 0833 07/30/20 0558  NA 136 136  K 3.8 3.9  CL 104 106  CO2 16* 22  GLUCOSE 213* 101*  BUN 12 13  CREATININE 0.71 0.58*  CALCIUM 8.5*  8.6*  MG 1.9 2.1  PHOS  --  2.0*   Liver Function Tests Recent Labs    07/28/20 0833  AST 43*  ALT 31  ALKPHOS 62  BILITOT 0.7  PROT 6.5  ALBUMIN 3.6   No results for input(s): LIPASE, AMYLASE in the last 72 hours. Cardiac Enzymes No results for input(s): CKTOTAL, CKMB, CKMBINDEX, TROPONINI in the last 72 hours. BNP Recent Labs    07/28/20 0833  BNP 37.9   D-Dimer No results for input(s): DDIMER in the last 72 hours. Hemoglobin A1C Recent Labs    07/29/20 0651  HGBA1C 5.2   Fasting Lipid Panel Recent Labs    07/29/20 0651  CHOL 161  HDL 40*  LDLCALC 106*  TRIG 74  CHOLHDL 4.0   Thyroid Function Tests No results for input(s): TSH, T4TOTAL, T3FREE, THYROIDAB in the last 72 hours.  Invalid input(s): FREET3  Telemetry    Normal sinus rhythm  ECG      Radiology    CT Head  Wo Contrast  Result Date: 07/28/2020 CLINICAL DATA:  70 year old male status post cardiac arrest. Headache. Planned heparin therapy. EXAM: CT HEAD WITHOUT CONTRAST TECHNIQUE: Contiguous axial images were obtained from the base of the skull through the vertex without intravenous contrast. COMPARISON:  None. FINDINGS: Brain: Cerebral volume is within normal limits for age. No midline shift, ventriculomegaly, mass effect, evidence of mass lesion, intracranial hemorrhage or evidence of cortically based acute infarction. Gray-white matter differentiation is within normal limits throughout the brain. Vascular: Mild Calcified atherosclerosis at the skull base. No suspicious intracranial vascular hyperdensity. Skull: No acute osseous abnormality identified. Sinuses/Orbits: Moderate anterior ethmoid but elsewhere minimal to mild sinus mucosal thickening. No sinus fluid levels. Hyperplastic frontal sinuses. Mastoids are clear. Other: Visualized orbits and scalp soft tissues are within normal limits. IMPRESSION: 1. Normal for age non contrast CT appearance of the brain. Negative for acute intracranial hemorrhage. This was discussed by telephone with Dr. Shaune Pollack on 07/28/2020 at 08:58 . 2. Mild to moderate ethmoid sinus disease. Electronically Signed   By: Odessa Fleming M.D.   On: 07/28/2020 09:00   CARDIAC CATHETERIZATION  Result Date: 07/28/2020  Prox RCA to Mid RCA lesion is 100% stenosed.  Prox Cx lesion is 75% stenosed.  Mid Cx lesion is 90% stenosed.  2nd [name] is 90% stenosed.  Mid Cx to Dist Cx lesion is 90% stenosed.  1st Diag lesion is 50% stenosed.  Prox LAD to Mid LAD lesion is 40% stenosed.  Successful PCI and stent with DES mid to distal RCA  Conclusion Multivessel coronary artery disease on diagnostic cardiac cath during STEMI Mid RCA infarct-related vessel Circumflex was large but had complex Trifurcation disease LAD was large relatively free of disease Left main large relatively free of  disease LV had mild reduced left ventricular function with inferior hypo-EF around 40 to 45% Intervention Successful PCI and stent of mid RCA with overlapping DES stents to the distal RCA TIMI-3 flow was restored from TIMI 0 Placed on aspirin and Brilinta Angiomax for an additional 4 hours Transferred to ICU Minx deployed No complications   DG Chest Portable 1 View  Result Date: 07/28/2020 CLINICAL DATA:  Shortness of breath.  Cardiac arrest. EXAM: PORTABLE CHEST 1 VIEW COMPARISON:  CT report 11/12/2002. FINDINGS: Mild prominence of the superior mediastinum noted. This may be secondary to AP technique. Follow-up PA and lateral chest x-ray suggested. Heart size normal. Low lung volumes. Very mild bilateral interstitial prominence. Mild interstitial  edema and/or pneumonitis can not be excluded. No pleural effusion or pneumothorax. No acute bony abnormality. IMPRESSION: 1. Mild prominence of the superior mediastinum. This may be secondary to AP technique. Follow-up PA and lateral chest x-ray suggested. Heart size normal. 2. Low lung volumes. Very mild bilateral interstitial prominence. Mild interstitial edema and/or pneumonitis can not be excluded. Electronically Signed   By: Maisie Fus  Register   On: 07/28/2020 08:52    Assessment & Plan    Principal Problem:   STEMI involving right coronary artery [facility]) Active Problems:   COPD (chronic obstructive pulmonary disease) (HCC)   HLD (hyperlipidemia)   Leukocytosis   Cardiac arrest (HCC)    1.  STEMI              -S/p PCI to the RCA with 2 drug-eluding stents             -Significant disease in the left circumflex as well; will treat medically and consider staged pci as outpatinet if fails medical management             -Started on aspirin, Brilinta,              -Will continue with lisinopril 5mg  daily and metoprolol 12.5mg  BID              -Continue atorvastatin 80mg  daily              -stop smoking  -cardiac rehab as outpatient.  -Okay for  discharge from a cardiac standpoint with outpatient follow-up in our office in 1 week.   2.  COPD              -Continue current medication regimen   3.  Hyperlipidemia              -Atorvastatin 80mg  initiated this admission   4.  Tobacco abuse              -Smoking cessation encouraged   Signed, . Dale Strausser MD 07/30/2020, 10:52 AM  Pager: [phone]

## 2020-07-30 NOTE — Discharge Summary (Signed)
Physician Discharge Summary  Andrew Potts NLG:[id number] DOB: [birth year] DOA: 07/28/2020  PCP: Andrew Skiff, NP  Admit date: 07/28/2020 .  Discharge date: 07/30/2020.  Admitted From: Home.  Disposition: Home.  Recommendations for Outpatient Follow-up:  1. Follow up with PCP in 1-2 weeks. 2. Please obtain BMP/CBC in one week 3. Advised to follow-up with cardiology Dr. Lady Potts within 1 week. 4. Advised to take aspirin 81 mg, Brilinta 90 mg twice daily, Lipitor 80 mg daily, lisinopril 5 mg daily, metoprolol 12.5 mg twice daily.  Home Health: None. Equipment/Devices: None  Discharge Condition: Stable CODE STATUS:Full code Diet recommendation: Heart Healthy   Brief Summary / Hospital course: This 70 years old male with PMH significant for hyperlipidemia, COPD, tobacco abuse, arthritis who presents in cardiac arrest.  Patient went to the work as usual and became unconscious. He was pulseless and CPR was started,  fire department states he was in V. fib on arrival,  Patient was given 3 cardioversion shocks with return of circulation.  Patient became more awake after return of circulation but has been confused.  Patient was found to have STEMI on EKG and negative CT head in the ED.  Patient underwent emergent  Left heart cath with a stent placement by Dr. Juliann Potts.  He is found to have two-vessel coronary artery disease,  Post PCI  He was started on aspirin, Brilinta, lisinopril, metoprolol, and Lipitor.  Cardiology  recommended staged PCI for other lesions.  Patient remained stable, he reports tenderness in the mid center because of CPR otherwise denies any chest pain, shortness of breath, dizziness, palpitations.  Patient is cleared from cardiology to be discharged.  Patient wants to go home.  Patient will follow up with Dr. Lady Potts in 1 week.  He was managed for blood problems.   Discharge Diagnoses:  Principal Problem:   STEMI involving right coronary artery (HCC) Active Problems:    COPD (chronic obstructive pulmonary disease) (HCC)   HLD (hyperlipidemia)   Leukocytosis   Cardiac arrest (HCC)   STEMI involving right coronary artery (HCC):trop 241.  Patient presented as cardiac arrest. He underwent CPR and defibrillation. He is found to have STEMI underwent emergent left heart cath. Pt underwent successful PCI and stent with overlapping stents to fix the mid RCA improvement from 0 to TIMI-3 flow.  - admitted to progressive unit as inpatient -  Was onAngiomax -Continue prn Nitroglycerin, -Continue aspirin, Brilinta,  Lipitor. -Angiomax has been discontinued. -Risk factor stratification: will check FLP and A1C  - check UDS - 2d echo Cardiology recommended staged PCI for left circumflex. -Will need cardiac rehab following discharge. Patient cleared from cardiology to be discharged.  COPD (chronic obstructive pulmonary disease) (HCC) -Continue bronchodilators as needed,  Mucinex  HLD (hyperlipidemia) -Continue Lipitor  Leukocytosis: WBC 14.6, no fever. No source of infection identified. Likely reactive -Follow-up with CBC  Cardiac arrest (HCC):Due to STEMI. CT head negative for acute intracranial abnormalities. No focal deficit on physical examination -Cardiac monitor.    Discharge Instructions  Discharge Instructions    AMB Referral to Cardiac Rehabilitation - Phase II   Complete by: As directed    Diagnosis: STEMI   After initial evaluation and assessments completed: Virtual Based Care may be provided alone or in conjunction with Phase 2 Cardiac Rehab based on patient barriers.: Yes   Call MD for:  difficulty breathing, headache or visual disturbances   Complete by: As directed    Call MD for:  persistant dizziness or light-headedness  Complete by: As directed    Call MD for:  persistant nausea and vomiting   Complete by: As directed    Diet - low sodium heart healthy   Complete by: As directed    Diet Carb Modified   Complete  by: As directed    Discharge instructions   Complete by: As directed    Advised to follow-up with primary care physician in 1 week. Advised to follow-up with cardiology Dr. Lady Potts within 1 week. Advised to take aspirin 81 mg, Brilinta 90 mg twice daily, Lipitor 80 mg daily, lisinopril 5 mg daily, metoprolol 12.5 mg twice daily.   Increase activity slowly   Complete by: As directed      Allergies as of 07/30/2020      Reactions   Codeine    Codone [hydrocodone] Other (See Comments)   Abdominal pain   Levaquin [levofloxacin] Swelling      Medication List    STOP taking these medications   meloxicam 15 MG tablet Commonly known as: MOBIC   sildenafil 20 MG tablet Commonly known as: REVATIO     TAKE these medications   aspirin 81 MG EC tablet Take 1 tablet (81 mg total) by mouth daily. Swallow whole. Start taking on: July 31, 2020   atorvastatin 80 MG tablet Commonly known as: LIPITOR Take 1 tablet (80 mg total) by mouth daily. Start taking on: July 31, 2020   lisinopril 5 MG tablet Commonly known as: ZESTRIL Take 1 tablet (5 mg total) by mouth daily. Start taking on: July 31, 2020   metoprolol tartrate 25 MG tablet Commonly known as: LOPRESSOR Take 0.5 tablets (12.5 mg total) by mouth 2 (two) times daily.   nitroGLYCERIN 0.4 MG SL tablet Commonly known as: NITROSTAT Place 1 tablet (0.4 mg total) under the tongue every 5 (five) minutes as needed for chest pain.   ProAir HFA 108 (90 Base) MCG/ACT inhaler Generic drug: albuterol Inhale 2 puffs into the lungs every 4 (four) hours as needed.   Symbicort 160-4.5 MCG/ACT inhaler Generic drug: budesonide-formoterol Inhale 2 puffs into the lungs 2 (two) times daily.   ticagrelor 90 MG Tabs tablet Commonly known as: BRILINTA Take 1 tablet (90 mg total) by mouth 2 (two) times daily.       Follow-up Information    Dalia Heading, MD Follow up in 1 week(s).   Specialty: Cardiology Contact information: [address] [place] [address] [phone]        Aura Dials T, NP Follow up in 1 week(s).   Specialty: Nurse Practitioner Contact information: [address] [place] [address] [phone]              Allergies  Allergen Reactions  . Codeine   . Codone [Hydrocodone] Other (See Comments)    Abdominal pain  . Levaquin [Levofloxacin] Swelling    Consultations:  Cardiology   Procedures/Studies: CT Head Wo Contrast  Result Date: 07/28/2020 CLINICAL DATA:  70 year old male status post cardiac arrest. Headache. Planned heparin therapy. EXAM: CT HEAD WITHOUT CONTRAST TECHNIQUE: Contiguous axial images were obtained from the base of the skull through the vertex without intravenous contrast. COMPARISON:  None. FINDINGS: Brain: Cerebral volume is within normal limits for age. No midline shift, ventriculomegaly, mass effect, evidence of mass lesion, intracranial hemorrhage or evidence of cortically based acute infarction. Gray-white matter differentiation is within normal limits throughout the brain. Vascular: Mild Calcified atherosclerosis at the skull base. No suspicious intracranial vascular hyperdensity. Skull: No  acute osseous abnormality identified. Sinuses/Orbits: Moderate anterior ethmoid but elsewhere minimal to mild sinus mucosal thickening. No sinus fluid levels. Hyperplastic frontal sinuses. Mastoids are clear. Other: Visualized orbits and scalp soft tissues are within normal limits. IMPRESSION: 1. Normal for age non contrast CT appearance of the brain. Negative for acute intracranial hemorrhage. This was discussed by telephone with Dr. Shaune Pollack on 07/28/2020 at 08:58 . 2. Mild to moderate ethmoid sinus disease. Electronically Signed   By: Odessa Fleming M.D.   On: 07/28/2020 09:00   CARDIAC CATHETERIZATION  Result Date: 07/28/2020  Prox RCA to Mid RCA lesion is 100% stenosed.  Prox Cx lesion is 75% stenosed.  Mid Cx lesion is 90% stenosed.  2nd [name] is  90% stenosed.  Mid Cx to Dist Cx lesion is 90% stenosed.  1st Diag lesion is 50% stenosed.  Prox LAD to Mid LAD lesion is 40% stenosed.  Successful PCI and stent with DES mid to distal RCA  Conclusion Multivessel coronary artery disease on diagnostic cardiac cath during STEMI Mid RCA infarct-related vessel Circumflex was large but had complex Trifurcation disease LAD was large relatively free of disease Left main large relatively free of disease LV had mild reduced left ventricular function with inferior hypo-EF around 40 to 45% Intervention Successful PCI and stent of mid RCA with overlapping DES stents to the distal RCA TIMI-3 flow was restored from TIMI 0 Placed on aspirin and Brilinta Angiomax for an additional 4 hours Transferred to ICU Minx deployed No complications   DG Chest Portable 1 View  Result Date: 07/28/2020 CLINICAL DATA:  Shortness of breath.  Cardiac arrest. EXAM: PORTABLE CHEST 1 VIEW COMPARISON:  CT report 11/12/2002. FINDINGS: Mild prominence of the superior mediastinum noted. This may be secondary to AP technique. Follow-up PA and lateral chest x-ray suggested. Heart size normal. Low lung volumes. Very mild bilateral interstitial prominence. Mild interstitial edema and/or pneumonitis can not be excluded. No pleural effusion or pneumothorax. No acute bony abnormality. IMPRESSION: 1. Mild prominence of the superior mediastinum. This may be secondary to AP technique. Follow-up PA and lateral chest x-ray suggested. Heart size normal. 2. Low lung volumes. Very mild bilateral interstitial prominence. Mild interstitial edema and/or pneumonitis can not be excluded. Electronically Signed   By: Maisie Fus  Register   On: 07/28/2020 08:52    Echocardiogram, left heart cath.   Subjective: Patient was seen and examined at bedside.  Overnight events noted.  Patient reports feeling much better.  Patient denies any chest pain.  Patient cleared from cardiology to be discharged.  Discharge  Exam: Vitals:   07/30/20 0725 07/30/20 1140  BP: 121/75 119/73  Pulse: 75 68  Resp: 19 19  Temp: 98.4 F (36.9 C) (!) 97.5 F (36.4 C)  SpO2: 94% 96%   Vitals:   07/29/20 1948 07/30/20 0423 07/30/20 0725 07/30/20 1140  BP: 121/73 108/67 121/75 119/73  Pulse: 69 64 75 68  Resp: 17 16 19 19   Temp: 98.3 F (36.8 C) 98.6 F (37 C) 98.4 F (36.9 C) (!) 97.5 F (36.4 C)  TempSrc:   Oral Oral  SpO2: 97% 94% 94% 96%  Weight:  67.4 kg    Height:        General: Pt is alert, awake, not in acute distress Cardiovascular: RRR, S1/S2 +, no rubs, no gallops Respiratory: CTA bilaterally, no wheezing, no rhonchi Abdominal: Soft, NT, ND, bowel sounds + Extremities: no edema, no cyanosis    The results of significant diagnostics from this  hospitalization (including imaging, microbiology, ancillary and laboratory) are listed below for reference.     Microbiology: Recent Results (from the past 240 hour(s))  Resp Panel by RT-PCR (Flu A&B, Covid) Nasopharyngeal Swab     Status: None   Collection Time: 07/28/20  8:33 AM   Specimen: Nasopharyngeal Swab; Nasopharyngeal(NP) swabs in vial transport medium  Result Value Ref Range Status   SARS Coronavirus 2 by RT PCR NEGATIVE NEGATIVE Final    Comment: (NOTE) SARS-CoV-2 target nucleic acids are NOT DETECTED.  The SARS-CoV-2 RNA is generally detectable in upper respiratory specimens during the acute phase of infection. The lowest concentration of SARS-CoV-2 viral copies this assay can detect is 138 copies/mL. A negative result does not preclude SARS-Cov-2 infection and should not be used as the sole basis for treatment or other patient management decisions. A negative result may occur with  improper specimen collection/handling, submission of specimen other than nasopharyngeal swab, presence of viral mutation(s) within the areas targeted by this assay, and inadequate number of viral copies(<138 copies/mL). A negative result must be  combined with clinical observations, patient history, and epidemiological information. The expected result is Negative.  Fact Sheet for Patients:  BloggerCourse.com  Fact Sheet for Healthcare Providers:  SeriousBroker.it  This test is no t yet approved or cleared by the [place] FDA and  has been authorized for detection and/or diagnosis of SARS-CoV-2 by FDA under an Emergency Use Authorization (EUA). This EUA will remain  in effect (meaning this test can be used) for the duration of the COVID-19 declaration under Section 564(b)(1) of the Act, 21 U.S.C.section 360bbb-3(b)(1), unless the authorization is terminated  or revoked sooner.       Influenza A by PCR NEGATIVE NEGATIVE Final   Influenza B by PCR NEGATIVE NEGATIVE Final    Comment: (NOTE) The Xpert Xpress SARS-CoV-2/FLU/RSV plus assay is intended as an aid in the diagnosis of influenza from Nasopharyngeal swab specimens and should not be used as a sole basis for treatment. Nasal washings and aspirates are unacceptable for Xpert Xpress SARS-CoV-2/FLU/RSV testing.  Fact Sheet for Patients: BloggerCourse.com  Fact Sheet for Healthcare Providers: SeriousBroker.it  This test is not yet approved or cleared by the [place] FDA and has been authorized for detection and/or diagnosis of SARS-CoV-2 by FDA under an Emergency Use Authorization (EUA). This EUA will remain in effect (meaning this test can be used) for the duration of the COVID-19 declaration under Section 564(b)(1) of the Act, 21 U.S.C. section 360bbb-3(b)(1), unless the authorization is terminated or revoked.  Performed at [facility], [address] [place], [place] [address]   MRSA PCR Screening     Status: None   Collection Time: 07/28/20 12:45 PM   Specimen: Nasal Mucosa; Nasopharyngeal  Result Value Ref Range Status   MRSA by PCR  NEGATIVE NEGATIVE Final    Comment:        The GeneXpert MRSA Assay (FDA approved for NASAL specimens only), is one component of a comprehensive MRSA colonization surveillance program. It is not intended to diagnose MRSA infection nor to guide or monitor treatment for MRSA infections. Performed at [facility], [address] [place], [place] [address]      Labs: BNP (last 3 results) Recent Labs    07/28/20 0833  BNP 37.9   Basic Metabolic Panel: Recent Labs  Lab 07/28/20 0833 07/30/20 0558  NA 136 136  K 3.8 3.9  CL 104 106  CO2 16* 22  GLUCOSE 213* 101*  BUN 12 13  CREATININE 0.71 0.58*  CALCIUM 8.5* 8.6*  MG 1.9 2.1  PHOS  --  2.0*   Liver Function Tests: Recent Labs  Lab 07/28/20 0833  AST 43*  ALT 31  ALKPHOS 62  BILITOT 0.7  PROT 6.5  ALBUMIN 3.6   No results for input(s): LIPASE, AMYLASE in the last 168 hours. No results for input(s): AMMONIA in the last 168 hours. CBC: Recent Labs  Lab 07/28/20 0833 07/30/20 0558  WBC 14.6* 12.2*  NEUTROABS 9.0*  --   HGB 16.2 15.0  HCT 47.2 42.6  MCV 98.1 94.9  PLT 200 223   Cardiac Enzymes: No results for input(s): CKTOTAL, CKMB, CKMBINDEX, TROPONINI in the last 168 hours. BNP: Invalid input(s): POCBNP CBG: Recent Labs  Lab 07/28/20 1212  GLUCAP 120*   D-Dimer No results for input(s): DDIMER in the last 72 hours. Hgb A1c Recent Labs    07/29/20 0651  HGBA1C 5.2   Lipid Profile Recent Labs    07/29/20 0651  CHOL 161  HDL 40*  LDLCALC 106*  TRIG 74  CHOLHDL 4.0   Thyroid function studies No results for input(s): TSH, T4TOTAL, T3FREE, THYROIDAB in the last 72 hours.  Invalid input(s): FREET3 Anemia work up No results for input(s): VITAMINB12, FOLATE, FERRITIN, TIBC, IRON, RETICCTPCT in the last 72 hours. Urinalysis No results found for: COLORURINE, APPEARANCEUR, LABSPEC, PHURINE, GLUCOSEU, HGBUR, BILIRUBINUR, KETONESUR, PROTEINUR, UROBILINOGEN, NITRITE,  LEUKOCYTESUR Sepsis Labs Invalid input(s): PROCALCITONIN,  WBC,  LACTICIDVEN Microbiology Recent Results (from the past 240 hour(s))  Resp Panel by RT-PCR (Flu A&B, Covid) Nasopharyngeal Swab     Status: None   Collection Time: 07/28/20  8:33 AM   Specimen: Nasopharyngeal Swab; Nasopharyngeal(NP) swabs in vial transport medium  Result Value Ref Range Status   SARS Coronavirus 2 by RT PCR NEGATIVE NEGATIVE Final    Comment: (NOTE) SARS-CoV-2 target nucleic acids are NOT DETECTED.  The SARS-CoV-2 RNA is generally detectable in upper respiratory specimens during the acute phase of infection. The lowest concentration of SARS-CoV-2 viral copies this assay can detect is 138 copies/mL. A negative result does not preclude SARS-Cov-2 infection and should not be used as the sole basis for treatment or other patient management decisions. A negative result may occur with  improper specimen collection/handling, submission of specimen other than nasopharyngeal swab, presence of viral mutation(s) within the areas targeted by this assay, and inadequate number of viral copies(<138 copies/mL). A negative result must be combined with clinical observations, patient history, and epidemiological information. The expected result is Negative.  Fact Sheet for Patients:  BloggerCourse.comhttps://www.fda.gov/media/152166/download  Fact Sheet for Healthcare Providers:  SeriousBroker.ithttps://www.fda.gov/media/152162/download  This test is no t yet approved or cleared by the [place] FDA and  has been authorized for detection and/or diagnosis of SARS-CoV-2 by FDA under an Emergency Use Authorization (EUA). This EUA will remain  in effect (meaning this test can be used) for the duration of the COVID-19 declaration under Section 564(b)(1) of the Act, 21 U.S.C.section 360bbb-3(b)(1), unless the authorization is terminated  or revoked sooner.       Influenza A by PCR NEGATIVE NEGATIVE Final   Influenza B by PCR NEGATIVE NEGATIVE  Final    Comment: (NOTE) The Xpert Xpress SARS-CoV-2/FLU/RSV plus assay is intended as an aid in the diagnosis of influenza from Nasopharyngeal swab specimens and should not be used as a sole basis for treatment. Nasal washings and aspirates are unacceptable for Xpert Xpress SARS-CoV-2/FLU/RSV testing.  Fact Sheet for Patients:  BloggerCourse.com  Fact Sheet for Healthcare Providers: SeriousBroker.it  This test is not yet approved or cleared by the [place] FDA and has been authorized for detection and/or diagnosis of SARS-CoV-2 by FDA under an Emergency Use Authorization (EUA). This EUA will remain in effect (meaning this test can be used) for the duration of the COVID-19 declaration under Section 564(b)(1) of the Act, 21 U.S.C. section 360bbb-3(b)(1), unless the authorization is terminated or revoked.  Performed at [facility], [address] [place], [place] [address]   MRSA PCR Screening     Status: None   Collection Time: 07/28/20 12:45 PM   Specimen: Nasal Mucosa; Nasopharyngeal  Result Value Ref Range Status   MRSA by PCR NEGATIVE NEGATIVE Final    Comment:        The GeneXpert MRSA Assay (FDA approved for NASAL specimens only), is one component of a comprehensive MRSA colonization surveillance program. It is not intended to diagnose MRSA infection nor to guide or monitor treatment for MRSA infections. Performed at [facility], [address] [place], [place] [address]      Time coordinating discharge: Over 30 minutes  SIGNED:   Cipriano Bunker, MD  [facility] 07/30/2020, 11:55 AM Pager   If 7PM-7AM, please contact night-coverage [email or web address]

## 2020-07-30 NOTE — TOC Progression Note (Addendum)
Transition of Care [facility]) - Progression Note    Patient Details  Name: Andrew Potts MRN: [id number] Date of Birth: [birth year]  Transition of Care [facility]) CM/SW Contact  Bing Quarry, RN Phone Number: 07/30/2020, 12:08 PM  Clinical Narrative:     07/30/20 Patient being discharged today. Communicating with provider re: [facility] request by daughter. There is no PT/OT evaluation, per prior CM assessment patient was independent PTA, lives alone, family in [place],  [facility] RN for CHF monitoring. HF Nav.Consult done.   Patient has transportation home when contacted. He was in such a hurry, he begged off the [facility] question. Gabriel Cirri RN CM        Expected Discharge Plan and Services           Expected Discharge Date: 07/30/20                                     Social Determinants of Health (SDOH) Interventions    Readmission Risk Interventions No flowsheet data found.

## 2020-07-30 NOTE — Discharge Instructions (Signed)
Advised to follow-up with primary care physician in 1 week. Advised to follow-up with cardiology Dr. [name] within 1 week. Advised to take aspirin 81 mg, Brilinta 90 mg twice daily, Lipitor 80 mg daily, lisinopril 5 mg daily, metoprolol 12.5 mg twice daily.

## 2020-08-01 ENCOUNTER — Telehealth: Payer: Self-pay

## 2020-08-01 NOTE — Telephone Encounter (Signed)
Transition Care Management Follow-up Telephone Call  Date of discharge and from where: [facility] 07/30/2020  How have you been since you were released from the hospital? Moving slow  Any questions or concerns? No  Items Reviewed:  Did the pt receive and understand the discharge instructions provided? Yes   Medications obtained and verified? Yes   Other? No   Any new allergies since your discharge? No   Dietary orders reviewed? Yes  Do you have support at home? No   Home Care and Equipment/Supplies: Were home health services ordered? no If so, what is the name of the agency? n/a  Has the agency set up a time to come to the patient's home? not applicable Were any new equipment or medical supplies ordered?  No What is the name of the medical supply agency? n/a Were you able to get the supplies/equipment? not applicable Do you have any questions related to the use of the equipment or supplies? No  Functional Questionnaire: (I = Independent and D = Dependent) ADLs: I  Bathing/Dressing- I  Meal Prep- I  Eating- I  Maintaining continence- I  Transferring/Ambulation- I  Managing Meds- I  Follow up appointments reviewed:   PCP Hospital f/u appt confirmed? Yes  Scheduled to see Aura Dials on 08/10/2020 @ 2:20.  Are transportation arrangements needed? No   If their condition worsens, is the pt aware to call PCP or go to the Emergency Dept.? Yes  Was the patient provided with contact information for the PCP's office or ED? Yes  Was to pt encouraged to call back with questions or concerns? Yes

## 2020-08-08 DIAGNOSIS — J449 Chronic obstructive pulmonary disease, unspecified: Secondary | ICD-10-CM | POA: Diagnosis not present

## 2020-08-08 DIAGNOSIS — E782 Mixed hyperlipidemia: Secondary | ICD-10-CM | POA: Diagnosis not present

## 2020-08-08 DIAGNOSIS — I2111 ST elevation (STEMI) myocardial infarction involving right coronary artery: Secondary | ICD-10-CM | POA: Diagnosis not present

## 2020-08-10 ENCOUNTER — Ambulatory Visit (INDEPENDENT_AMBULATORY_CARE_PROVIDER_SITE_OTHER): Payer: Medicare Other | Admitting: Nurse Practitioner

## 2020-08-10 ENCOUNTER — Other Ambulatory Visit: Payer: Self-pay

## 2020-08-10 ENCOUNTER — Encounter: Payer: Self-pay | Admitting: Nurse Practitioner

## 2020-08-10 VITALS — BP 125/70 | HR 59 | Temp 98.6°F | Ht 67.48 in | Wt 150.2 lb

## 2020-08-10 DIAGNOSIS — D72820 Lymphocytosis (symptomatic): Secondary | ICD-10-CM | POA: Diagnosis not present

## 2020-08-10 DIAGNOSIS — F17219 Nicotine dependence, cigarettes, with unspecified nicotine-induced disorders: Secondary | ICD-10-CM | POA: Diagnosis not present

## 2020-08-10 DIAGNOSIS — R030 Elevated blood-pressure reading, without diagnosis of hypertension: Secondary | ICD-10-CM | POA: Diagnosis not present

## 2020-08-10 DIAGNOSIS — I2111 ST elevation (STEMI) myocardial infarction involving right coronary artery: Secondary | ICD-10-CM

## 2020-08-10 DIAGNOSIS — K732 Chronic active hepatitis, not elsewhere classified: Secondary | ICD-10-CM

## 2020-08-10 DIAGNOSIS — E782 Mixed hyperlipidemia: Secondary | ICD-10-CM

## 2020-08-10 DIAGNOSIS — J449 Chronic obstructive pulmonary disease, unspecified: Secondary | ICD-10-CM | POA: Diagnosis not present

## 2020-08-10 DIAGNOSIS — E039 Hypothyroidism, unspecified: Secondary | ICD-10-CM | POA: Diagnosis not present

## 2020-08-10 NOTE — Assessment & Plan Note (Addendum)
Acute event, stable at this time.  Recommend he continue all current medications and collaboration with cardiology.  Referral for cardiac rehab placed.  Labs today.

## 2020-08-10 NOTE — Assessment & Plan Note (Signed)
Chronic, stable.  Spirometry FEV1/FVC 90% and FEV1 76% in 2020.   Recommend continued cessation.  Continue current medication regimen, discussed possible change to Anoro vs Symbicort but it is too costly at this time, consider for future and consider CCM referral in future.  He refuses Lung CA Screening CT at this time due to Covid, recommended he obtain this.  Return in 3 months with repeat spirometry.

## 2020-08-10 NOTE — Assessment & Plan Note (Signed)
Ongoing.  Have highly recommended he schedule follow-up with GI to initiate treatment, discussed at length with him.  Continue collaboration with GI, Dr. Tobi Bastos.

## 2020-08-10 NOTE — Assessment & Plan Note (Signed)
Recommend continued cessation of smoking. 

## 2020-08-10 NOTE — Patient Instructions (Signed)
Heart Attack A heart attack occurs when blood and oxygen supply to the heart is cut off. A heart attack causes damage to the heart that cannot be fixed. A heart attack is also called a myocardial infarction, or MI. If you think you are having a heart attack, do not wait to see if the symptoms will go away. Get medical help right away. What are the causes? This condition may be caused by:  A fatty substance (plaque) in the blood vessels (arteries). This can block the flow of blood to the heart.  A blood clot in the blood vessels that go to the heart. The blood clot blocks blood flow.  Low blood pressure.  An abnormal heartbeat.  Some diseases, such as problems in red blood cells (anemia)orproblems in breathing (respiratory failure).  Tightening (spasm) of a blood vessel that cuts off blood to the heart.  A tear in a blood vessel of the heart.  High blood pressure.   What increases the risk? The following factors may make you more likely to develop this condition:  Aging. The older you are, the higher your risk.  Having a personal or family history of chest pain, heart attack, stroke, or narrowing of the arteries in the legs, arms, head, or stomach (peripheral artery disease).  Being male.  Smoking.  Not getting regular exercise.  Being overweight or obese.  Having high blood pressure.  Having high cholesterol.  Having diabetes.  Drinking too much alcohol.  Using illegal drugs, such as cocaine or methamphetamine. What are the signs or symptoms? Symptoms of this condition include:  Chest pain. It may feel like: ? Crushing or squeezing. ? Tightness, pressure, fullness, or heaviness.  Pain in the arm, neck, jaw, back, or upper body.  Shortness of breath.  Heartburn.  Upset stomach (indigestion).  Feeling like you may vomit (nauseous).  Cold sweats.  Feeling tired.  Sudden light-headedness. How is this treated? A heart attack must be treated as soon as  possible. Treatment may include:  Medicines to: ? Break up or dissolve blood clots. ? Thin blood and help prevent blood clots. ? Treat blood pressure. ? Improve blood flow to the heart. ? Reduce pain. ? Reduce cholesterol.  Procedures to widen a blocked artery and keep it open.  Open heart surgery.  Receiving oxygen.  Making your heart strong again (cardiac rehabilitation) through exercise, education, and counseling.   Follow these instructions at home: Medicines  Take over-the-counter and prescription medicines only as told by your doctor. You may need to take medicine: ? To keep your blood from clotting too easily. ? To control blood pressure. ? To lower cholesterol. ? To control heart rhythms.  Do not take these medicines unless your doctor says it is okay: ? NSAIDs, such as ibuprofen. ? Supplements that have vitamin A, vitamin E, or both. ? Hormone replacement therapy that has estrogen with or without progestin. Lifestyle  Do not use any products that have nicotine or tobacco, such as cigarettes, e-cigarettes, and chewing tobacco. If you need help quitting, ask your doctor.  Avoid secondhand smoke.  Exercise regularly. Ask your doctor about a cardiac rehab program.  Eat heart-healthy foods. Your doctor will tell you what foods to eat.  Stay at a healthy weight.  Lower your stress level.  Do not use illegal drugs.      Alcohol use  Do not drink alcohol if: ? Your doctor tells you not to drink. ? You are pregnant, may be pregnant, or   are planning to become pregnant.  If you drink alcohol: ? Limit how much you use to:  0-1 drink a day for women.  0-2 drinks a day for men. ? Know how much alcohol is in your drink. In the [place] one drink equals one 12 oz bottle of beer (355 mL), one 5 oz glass of wine (148 mL), or one 1 oz glass of hard liquor (44 mL). General instructions  Work with your doctor to treat other problems you may have, such as diabetes or  high blood pressure.  Get screened for depression. Get treatment if needed.  Keep your vaccines up to date. Get the flu shot (influenza vaccine) every year.  Keep all follow-up visits as told by your doctor. This is important. Contact a doctor if:  You feel very sad.  You have trouble doing your daily activities. Get help right away if:  You have sudden, unexplained discomfort in your chest, arms, back, neck, jaw, or upper body.  You have shortness of breath.  You have sudden sweating or clammy skin.  You feel like you may vomit.  You vomit.  You feel tired or weak.  You get light-headed or dizzy.  You feel your heart beating fast.  You feel your heart skipping beats.  You have blood pressure that is higher than 180/120. These symptoms may be an emergency. Do not wait to see if the symptoms will go away. Get medical help right away. Call your local emergency services (911 in the [place] Do not drive yourself to the hospital. Summary  A heart attack occurs when blood and oxygen supply to the heart is cut off.  Do not take NSAIDs unless your doctor says it is okay.  Do not smoke. Avoid secondhand smoke.  Exercise regularly. Ask your doctor about a cardiac rehab program. This information is not intended to replace advice given to you by your health care provider. Make sure you discuss any questions you have with your health care provider. Document Revised: 10/13/2018 Document Reviewed: 10/13/2018 Elsevier Patient Education  2021 [employer]vier Inc.  

## 2020-08-10 NOTE — Assessment & Plan Note (Signed)
Noted on hospital labs, repeat CBC today. 

## 2020-08-10 NOTE — Progress Notes (Signed)
BP 125/70   Pulse (!) 59   Temp 98.6 F (37 C) (Oral)   Ht 5' 7.48" (1.714 m)   Wt 150 lb 3.2 oz (68.1 kg)   BMI 23.19 kg/m    Subjective:    Patient ID: Andrew Potts, male    DOB: 1951/04/22, 70 y.o.   MRN: [id number]  HPI: [name] is a 70 y.o. male  Chief Complaint  Patient presents with  . Hospitalization Follow-up    Patient had heart attack on 07/29/19. Was released on Saturday 08/06/20   Transition of Care Hospital Follow up.  Was admitted to [facility] on 07/28/20 for cardiac arrest at his workplace -- his boss did chest compressions until EMS arrived and took over care.  Cardiology has told him he can not lift heavy at his workplace, [employer], saw Dr. [name] for initial consult 08/08/20 -- he took patient off Brilinta and started Plavix.  He is scheduled next week with Clarisa Kindred NP at [facility] -- as noted during heart cath EF 40-45%.  Has cardiac rehab ordered in hospital, but he has not heard from them.  Was a smoker and has now quit smoking.  Has history of Hep C + on screening, followed by GI.  "Recommendations for Outpatient Follow-up:  1. Follow up with PCP in 1-2 weeks. 2. Please obtain BMP/CBC in one week 3. Advised to follow-up with cardiology Dr. [name] within 1 week. 4. Advised to take aspirin 81 mg, Brilinta 90 mg twice daily, Lipitor 80 mg daily, lisinopril 5 mg daily, metoprolol 12.5 mg twice daily.  Home Health: None. Equipment/Devices: None  Discharge Condition: Stable CODE STATUS:Full code Diet recommendation: Heart Healthy   Brief Summary / Hospital course: This 70 years old male with PMH significant for hyperlipidemia, COPD, tobacco abuse, arthritis who presents in cardiac arrest. Patient went to the work as usual and became unconscious. He was pulseless and CPR was started,fire department stateshe was in V. fib on arrival, Patient was given 3 cardioversion shocks with return of circulation. Patient became more awake after return of  circulation but has been confused. Patient was found to have STEMI on EKG and negative CT head in the ED. Patient underwent emergent  Left heart cath with a stent placement by Dr. Juliann Pares.He is found to have two-vessel coronary artery disease, Post PCI  He was started on aspirin, Brilinta, lisinopril, metoprolol, and Lipitor.  Cardiology recommended staged PCI for other lesions.  Patient remained stable, he reports tenderness in the mid center because of CPR otherwise denies any chest pain, shortness of breath, dizziness, palpitations.  Patient is cleared from cardiology to be discharged.  Patient wants to go home.  Patient will follow up with Dr. [name] in 1 week.  He was managed for blood problems.   Discharge Diagnoses:  Principal Problem:   STEMI involving right coronary artery (HCC) Active Problems:   COPD (chronic obstructive pulmonary disease) (HCC)   HLD (hyperlipidemia)   Leukocytosis   Cardiac arrest (HCC)   STEMI involving right coronary artery (HCC):trop 241. Patient presented as cardiac arrest.He underwent CPR and defibrillation. He is found to have STEMI underwent emergent left heart cath. Pt underwent successful PCI and stent with overlapping stents to fix the mid RCA improvement from 0 to TIMI-3 flow.  - admittedto progressive unit as inpatient -WasonAngiomax -Continueprn Nitroglycerin, -Continue aspirin,Brilinta,Lipitor. -Angiomax has been discontinued. -Risk factor stratification: will check FLP and A1C  - check UDS - 2d echo Cardiology recommended staged PCI  for left circumflex. -Will need cardiac rehab following discharge. Patient cleared from cardiology to be discharged.  COPD (chronic obstructive pulmonary disease) (HCC) -Continue bronchodilators as needed,Mucinex  HLD (hyperlipidemia) -ContinueLipitor  Leukocytosis: WBC 14.6, no fever. No source of infection identified. Likely reactive -Follow-up with CBC  Cardiac  arrest (HCC):Due to STEMI. CT head negative for acute intracranial abnormalities. No focal deficit on physical examination -Cardiac monitor."   [facility] Physician: Dr. Lucianne MussKumar D/C Date: 07/30/20  Records Requested: 08/10/20 Records Received: 08/10/20 Records Reviewed: 08/10/20  Diagnoses on Discharge: STEMI involving right coronary artery  Date of interactive Contact within 48 hours of discharge:  Contact was through: phone  Date of 7 day or 14 day face-to-face visit:    within 7 days  Outpatient Encounter Medications as of 08/10/2020  Medication Sig  . aspirin EC 81 MG EC tablet Take 1 tablet (81 mg total) by mouth daily. Swallow whole.  Marland Kitchen. atorvastatin (LIPITOR) 80 MG tablet Take 1 tablet (80 mg total) by mouth daily.  . clopidogrel (PLAVIX) 75 MG tablet Take by mouth.  Marland Kitchen. lisinopril (ZESTRIL) 5 MG tablet Take 1 tablet (5 mg total) by mouth daily.  . metoprolol tartrate (LOPRESSOR) 25 MG tablet Take 0.5 tablets (12.5 mg total) by mouth 2 (two) times daily.  . nitroGLYCERIN (NITROSTAT) 0.4 MG SL tablet Place 1 tablet (0.4 mg total) under the tongue every 5 (five) minutes as needed for chest pain.  Marland Kitchen. PROAIR HFA 108 (90 Base) MCG/ACT inhaler Inhale 2 puffs into the lungs every 4 (four) hours as needed.  . SYMBICORT 160-4.5 MCG/ACT inhaler Inhale 2 puffs into the lungs 2 (two) times daily.  . ticagrelor (BRILINTA) 90 MG TABS tablet Take 1 tablet (90 mg total) by mouth 2 (two) times daily. (Patient not taking: Reported on 08/10/2020)   No facility-administered encounter medications on file as of 08/10/2020.    Diagnostic Tests Reviewed/Disposition:   EXAM: CT HEAD WITHOUT CONTRAST  TECHNIQUE: Contiguous axial images were obtained from the base of the skull through the vertex without intravenous contrast.  COMPARISON:  None.  FINDINGS: Brain: Cerebral volume is within normal limits for age. No midline shift, ventriculomegaly, mass effect, evidence of mass  lesion, intracranial hemorrhage or evidence of cortically based acute infarction. Gray-white matter differentiation is within normal limits throughout the brain.  Vascular: Mild Calcified atherosclerosis at the skull base. No suspicious intracranial vascular hyperdensity.  Skull: No acute osseous abnormality identified.  Sinuses/Orbits: Moderate anterior ethmoid but elsewhere minimal to mild sinus mucosal thickening. No sinus fluid levels. Hyperplastic frontal sinuses. Mastoids are clear.  Other: Visualized orbits and scalp soft tissues are within normal limits.  IMPRESSION: 1. Normal for age non contrast CT appearance of the brain. Negative for acute intracranial hemorrhage. This was discussed by telephone with Dr. Shaune PollackAMERON ISAACS on 07/28/2020 at 08:58 .  2. Mild to moderate ethmoid sinus disease.   Electronically Signed   By: Odessa FlemingH  Hall M.D.   On: 07/28/2020 09:00  Consults: Cardiology  Discharge Instructions:  Follow-up with PCP and cardiology  Disease/illness Education: Educated on MI and risks  Home Health/Community Services Discussions/Referrals: Referral to cardiac rehab placed  Establishment or re-establishment of referral orders for community resources: None  Discussion with other health care providers: Reviewed notes  Assessment and Support of treatment regimen adherence: Reviewed with patient at bedside  Appointments Coordinated with: Reviewed with patient at bedside  Education for self-management, independent living, and ADLs: Reviewed with patient at bedside  Relevant past medical, surgical,  family and social history reviewed and updated as indicated. Interim medical history since our last visit reviewed. Allergies and medications reviewed and updated.  Review of Systems  Constitutional: Negative for activity change, diaphoresis, fatigue and fever.  Respiratory: Negative for cough, chest tightness, shortness of breath and wheezing.    Cardiovascular: Negative for chest pain, palpitations and leg swelling.  Gastrointestinal: Negative.   Neurological: Negative.   Psychiatric/Behavioral: Negative.     Per HPI unless specifically indicated above     Objective:    BP 125/70   Pulse (!) 59   Temp 98.6 F (37 C) (Oral)   Ht 5' 7.48" (1.714 m)   Wt 150 lb 3.2 oz (68.1 kg)   BMI 23.19 kg/m   Wt Readings from Last 3 Encounters:  08/10/20 150 lb 3.2 oz (68.1 kg)  07/30/20 148 lb 11.2 oz (67.4 kg)  05/03/20 152 lb 3.2 oz (69 kg)    Physical Exam Vitals and nursing note reviewed.  Constitutional:      General: He is awake. He is not in acute distress.    Appearance: He is well-developed and well-groomed. He is not ill-appearing.  HENT:     Head: Normocephalic and atraumatic.     Right Ear: Hearing normal. No drainage.     Left Ear: Hearing normal. No drainage.  Eyes:     General: Lids are normal.        Right eye: No discharge.        Left eye: No discharge.     Conjunctiva/sclera: Conjunctivae normal.     Pupils: Pupils are equal, round, and reactive to light.  Neck:     Thyroid: No thyromegaly.     Vascular: No carotid bruit.     Trachea: Trachea normal.  Cardiovascular:     Rate and Rhythm: Normal rate and regular rhythm.     Heart sounds: Normal heart sounds, S1 normal and S2 normal. No murmur heard. No gallop.   Pulmonary:     Effort: Pulmonary effort is normal.     Breath sounds: Normal breath sounds.  Abdominal:     General: Bowel sounds are normal.     Palpations: Abdomen is soft. There is no hepatomegaly or splenomegaly.  Musculoskeletal:        General: Normal range of motion.     Cervical back: Normal range of motion and neck supple.     Right lower leg: No edema.     Left lower leg: No edema.  Skin:    General: Skin is warm and dry.     Capillary Refill: Capillary refill takes less than 2 seconds.  Neurological:     Mental Status: He is alert and oriented to person, place, and time.      Deep Tendon Reflexes: Reflexes are normal and symmetric.  Psychiatric:        Attention and Perception: Attention normal.        Mood and Affect: Mood normal.        Speech: Speech normal.        Behavior: Behavior normal. Behavior is cooperative.        Thought Content: Thought content normal.     Results for orders placed or performed during the hospital encounter of 07/28/20  Resp Panel by RT-PCR (Flu A&B, Covid) Nasopharyngeal Swab   Specimen: Nasopharyngeal Swab; Nasopharyngeal(NP) swabs in vial transport medium  Result Value Ref Range   SARS Coronavirus 2 by RT PCR NEGATIVE NEGATIVE   Influenza A  by PCR NEGATIVE NEGATIVE   Influenza B by PCR NEGATIVE NEGATIVE  MRSA PCR Screening   Specimen: Nasal Mucosa; Nasopharyngeal  Result Value Ref Range   MRSA by PCR NEGATIVE NEGATIVE  CBC with Differential  Result Value Ref Range   WBC 14.6 (H) 4.0 - 10.5 K/uL   RBC 4.81 4.22 - 5.81 MIL/uL   Hemoglobin 16.2 13.0 - 17.0 g/dL   HCT 40.9 81.1 - 91.4 %   MCV 98.1 80.0 - 100.0 fL   MCH 33.7 26.0 - 34.0 pg   MCHC 34.3 30.0 - 36.0 g/dL   RDW 78.2 95.6 - 21.3 %   Platelets 200 150 - 400 K/uL   nRBC 0.0 0.0 - 0.2 %   Neutrophils Relative % 60 %   Neutro Abs 9.0 (H) 1.7 - 7.7 K/uL   Lymphocytes Relative 26 %   Lymphs Abs 3.8 0.7 - 4.0 K/uL   Monocytes Relative 8 %   Monocytes Absolute 1.1 (H) 0.1 - 1.0 K/uL   Eosinophils Relative 3 %   Eosinophils Absolute 0.4 0.0 - 0.5 K/uL   Basophils Relative 1 %   Basophils Absolute 0.1 0.0 - 0.1 K/uL   Immature Granulocytes 2 %   Abs Immature Granulocytes 0.29 (H) 0.00 - 0.07 K/uL  Comprehensive metabolic panel  Result Value Ref Range   Sodium 136 135 - 145 mmol/L   Potassium 3.8 3.5 - 5.1 mmol/L   Chloride 104 98 - 111 mmol/L   CO2 16 (L) 22 - 32 mmol/L   Glucose, Bld 213 (H) 70 - 99 mg/dL   BUN 12 8 - 23 mg/dL   Creatinine, Ser 0.86 0.61 - 1.24 mg/dL   Calcium 8.5 (L) 8.9 - 10.3 mg/dL   Total Protein 6.5 6.5 - 8.1 g/dL   Albumin  3.6 3.5 - 5.0 g/dL   AST 43 (H) 15 - 41 U/L   ALT 31 0 - 44 U/L   Alkaline Phosphatase 62 38 - 126 U/L   Total Bilirubin 0.7 0.3 - 1.2 mg/dL   GFR, Estimated >57 >84 mL/min   Anion gap 16 (H) 5 - 15  Magnesium  Result Value Ref Range   Magnesium 1.9 1.7 - 2.4 mg/dL  Brain natriuretic peptide  Result Value Ref Range   B Natriuretic Peptide 37.9 0.0 - 100.0 pg/mL  Ethanol  Result Value Ref Range   Alcohol, Ethyl (B) <10 <10 mg/dL  Blood gas, venous  Result Value Ref Range   pH, Ven 7.33 7.250 - 7.430   pCO2, Ven 47 44.0 - 60.0 mmHg   pO2, Ven 50.0 (H) 32.0 - 45.0 mmHg   Bicarbonate 24.8 20.0 - 28.0 mmol/L   Acid-base deficit 1.7 0.0 - 2.0 mmol/L   O2 Saturation 82.0 %   Patient temperature 37.0    Collection site VEIN    Sample type VEIN   APTT  Result Value Ref Range   aPTT 70 (H) 24 - 36 seconds  Protime-INR  Result Value Ref Range   Prothrombin Time 13.9 11.4 - 15.2 seconds   INR 1.1 0.8 - 1.2  Glucose, capillary  Result Value Ref Range   Glucose-Capillary 120 (H) 70 - 99 mg/dL  Hemoglobin O9G  Result Value Ref Range   Hgb A1c MFr Bld 5.2 4.8 - 5.6 %   Mean Plasma Glucose 102.54 mg/dL  Lipid panel  Result Value Ref Range   Cholesterol 161 0 - 200 mg/dL   Triglycerides 74 <295 mg/dL   HDL 40 (  L) >40 mg/dL   Total CHOL/HDL Ratio 4.0 RATIO   VLDL 15 0 - 40 mg/dL   LDL Cholesterol 921 (H) 0 - 99 mg/dL  CBC  Result Value Ref Range   WBC 12.2 (H) 4.0 - 10.5 K/uL   RBC 4.49 4.22 - 5.81 MIL/uL   Hemoglobin 15.0 13.0 - 17.0 g/dL   HCT 19.4 17.4 - 08.1 %   MCV 94.9 80.0 - 100.0 fL   MCH 33.4 26.0 - 34.0 pg   MCHC 35.2 30.0 - 36.0 g/dL   RDW 44.8 18.5 - 63.1 %   Platelets 223 150 - 400 K/uL   nRBC 0.0 0.0 - 0.2 %  Magnesium  Result Value Ref Range   Magnesium 2.1 1.7 - 2.4 mg/dL  Phosphorus  Result Value Ref Range   Phosphorus 2.0 (L) 2.5 - 4.6 mg/dL  Basic metabolic panel  Result Value Ref Range   Sodium 136 135 - 145 mmol/L   Potassium 3.9 3.5 - 5.1  mmol/L   Chloride 106 98 - 111 mmol/L   CO2 22 22 - 32 mmol/L   Glucose, Bld 101 (H) 70 - 99 mg/dL   BUN 13 8 - 23 mg/dL   Creatinine, Ser 4.97 (L) 0.61 - 1.24 mg/dL   Calcium 8.6 (L) 8.9 - 10.3 mg/dL   GFR, Estimated >02 >63 mL/min   Anion gap 8 5 - 15  POCT Activated clotting time  Result Value Ref Range   Activated Clotting Time 470 seconds  Troponin I (High Sensitivity)  Result Value Ref Range   Troponin I (High Sensitivity) 241 (HH) <18 ng/L  Troponin I (High Sensitivity)  Result Value Ref Range   Troponin I (High Sensitivity) 3,427 (HH) <18 ng/L  Troponin I (High Sensitivity)  Result Value Ref Range   Troponin I (High Sensitivity) 18,515 (HH) <18 ng/L  Troponin I (High Sensitivity)  Result Value Ref Range   Troponin I (High Sensitivity) 13,727 (HH) <18 ng/L  Troponin I (High Sensitivity)  Result Value Ref Range   Troponin I (High Sensitivity) 11,207 (HH) <18 ng/L      Assessment & Plan:   Problem List Items Addressed This Visit      Cardiovascular and Mediastinum   White coat syndrome without diagnosis of hypertension    Stable with BP at goal today.  Recommend he monitor BP at home three mornings a week and document for provider, educated him on this.  Continue diet and exercise focus, DASH diet.  Labs today.  Return in 3 months for follow-up.      STEMI involving right coronary artery (HCC) - Primary    Acute event, stable at this time.  Recommend he continue all current medications and collaboration with cardiology.  Referral for cardiac rehab placed.  Labs today.      Relevant Orders   Basic metabolic panel   CBC with Differential/Platelet   Lipid Panel w/o Chol/HDL Ratio   TSH     Respiratory   COPD (chronic obstructive pulmonary disease) (HCC)    Chronic, stable.  Spirometry FEV1/FVC 90% and FEV1 76% in 2020.   Recommend continued cessation.  Continue current medication regimen, discussed possible change to Anoro vs Symbicort but it is too costly at this  time, consider for future and consider CCM referral in future.  He refuses Lung CA Screening CT at this time due to Covid, recommended he obtain this.  Return in 3 months with repeat spirometry.  Digestive   Chronic active hepatitis (HCC)    Ongoing.  Have highly recommended he schedule follow-up with GI to initiate treatment, discussed at length with him.  Continue collaboration with GI, Dr. Tobi Bastos.        Nervous and Auditory   Nicotine dependence, cigarettes, w unsp disorders    Recommend continued cessation of smoking.        Other   HLD (hyperlipidemia)    Chronic, ongoing with recent NSTEMI.  He will continue statin.  Lipid panel today.  Adjust meds as needed.      Leukocytosis    Noted on hospital labs, repeat CBC today.       Other Visit Diagnoses    Low phosphate levels       Recheck level today   Relevant Orders   Phosphorus      Time: 30 minutes, >50% spent counseling/or care coordination   Follow up plan: Return in about 3 months (around 11/08/2020) for MI, HLD, COPD.

## 2020-08-10 NOTE — Assessment & Plan Note (Addendum)
Stable with BP at goal today.  Recommend he monitor BP at home three mornings a week and document for provider, educated him on this.  Continue diet and exercise focus, DASH diet.  Labs today.  Return in 3 months for follow-up.

## 2020-08-10 NOTE — Assessment & Plan Note (Signed)
Chronic, ongoing with recent NSTEMI.  He will continue statin.  Lipid panel today.  Adjust meds as needed.

## 2020-08-11 LAB — TSH: TSH: 1.44 u[IU]/mL (ref 0.450–4.500)

## 2020-08-11 LAB — BASIC METABOLIC PANEL
BUN/Creatinine Ratio: 16 (ref 10–24)
BUN: 11 mg/dL (ref 8–27)
CO2: 23 mmol/L (ref 20–29)
Calcium: 9.7 mg/dL (ref 8.6–10.2)
Chloride: 99 mmol/L (ref 96–106)
Creatinine, Ser: 0.7 mg/dL — ABNORMAL LOW (ref 0.76–1.27)
GFR calc Af Amer: 113 mL/min/{1.73_m2} (ref >59)
GFR calc non Af Amer: 98 mL/min/{1.73_m2} (ref >59)
Glucose: 77 mg/dL (ref 65–99)
Potassium: 4.7 mmol/L (ref 3.5–5.2)
Sodium: 137 mmol/L (ref 134–144)

## 2020-08-11 LAB — LIPID PANEL W/O CHOL/HDL RATIO
Cholesterol, Total: 117 mg/dL (ref 100–199)
HDL: 42 mg/dL (ref >39)
LDL Chol Calc (NIH): 61 mg/dL (ref 0–99)
Triglycerides: 66 mg/dL (ref 0–149)
VLDL Cholesterol Cal: 14 mg/dL (ref 5–40)

## 2020-08-11 LAB — CBC WITH DIFFERENTIAL/PLATELET
Basophils Absolute: 0.1 10*3/uL (ref 0.0–0.2)
Basos: 1 %
EOS (ABSOLUTE): 0.6 10*3/uL — ABNORMAL HIGH (ref 0.0–0.4)
Eos: 5 %
Hematocrit: 43.9 % (ref 37.5–51.0)
Hemoglobin: 15.6 g/dL (ref 13.0–17.7)
Immature Grans (Abs): 0.1 10*3/uL (ref 0.0–0.1)
Immature Granulocytes: 1 %
Lymphocytes Absolute: 3.3 10*3/uL — ABNORMAL HIGH (ref 0.7–3.1)
Lymphs: 31 %
MCH: 33.8 pg — ABNORMAL HIGH (ref 26.6–33.0)
MCHC: 35.5 g/dL (ref 31.5–35.7)
MCV: 95 fL (ref 79–97)
Monocytes Absolute: 1 10*3/uL — ABNORMAL HIGH (ref 0.1–0.9)
Monocytes: 10 %
Neutrophils Absolute: 5.9 10*3/uL (ref 1.4–7.0)
Neutrophils: 52 %
Platelets: 424 10*3/uL (ref 150–450)
RBC: 4.61 x10E6/uL (ref 4.14–5.80)
RDW: 11.8 % (ref 11.6–15.4)
WBC: 10.9 10*3/uL — ABNORMAL HIGH (ref 3.4–10.8)

## 2020-08-11 LAB — PHOSPHORUS: Phosphorus: 3.9 mg/dL (ref 2.8–4.1)

## 2020-08-11 NOTE — Progress Notes (Signed)
Please let Mujtaba know his labs have returned.  Kidney function is normal, as are electrolytes.  Thyroid level is normal.  Your cholesterol levels are MUCH improved with LDL, bad cholesterol, at goal for heart protection.  Continue your statin daily and cessation of smoking.  Your white blood cell count remains mildly elevated, but is trending down to normal, suspect this is related to recent events.  We will recheck this next visit.  Any questions? Keep being awesome!!  Thank you for allowing me to participate in your care. Kindest regards, Miller Limehouse

## 2020-08-15 NOTE — Progress Notes (Signed)
Patient ID: Andrew Potts, male    DOB: 09-03-1950, 69 y.o.   MRN: [id number]  HPI  Mr Ciccarelli is a 70 y/o male with a history of COPD, CAD (STEMI), arthritis, recent tobacco use and chronic heart failure.   No echo to report  LHC done 07/28/20 showed:  Prox RCA to Mid RCA lesion is 100% stenosed.  Prox Cx lesion is 75% stenosed.  Mid Cx lesion is 90% stenosed.  2nd [name] is 90% stenosed.  Mid Cx to Dist Cx lesion is 90% stenosed.  1st Diag lesion is 50% stenosed.  Prox LAD to Mid LAD lesion is 40% stenosed.  Successful PCI and stent with DES mid to distal RCA   Conclusion Multivessel coronary artery disease on diagnostic cardiac cath during STEMI Mid RCA infarct-related vessel Circumflex was large but had complex Trifurcation disease LAD was large relatively free of disease Left main large relatively free of disease LV had mild reduced left ventricular function with inferior hypo-EF around 40 to 45% Intervention Successful PCI and stent of mid RCA with overlapping DES stents to the distal RCA TIMI-3 flow was restored from TIMI 0 Placed on aspirin and Brilinta Angiomax for an additional 4 hours  Admitted 07/28/20 due to cardiac arrest with subsequent CPR and 3 shocks delivered due to being in VF. Cardiology consult obtained. EKG showed STEMI. Negative head CT. Emergent cath with stent placement completed. Discharged after 2 days.   He presents today for his initial visit with a chief complaint of minimal shortness of breath upon moderate exertion. He describes this as having been present for a few weeks and continues to improve. He has associated fatigue, cough and rib pain along with this. He denies any difficulty sleeping, dizziness, abdominal distention, palpitations, pedal edema or chest pain.   Says that he hasn't smoked since his recent admission.   Past Medical History:  Diagnosis Date  . Arthritis   . CHF (congestive heart failure) (HCC)   . COPD (chronic  obstructive pulmonary disease) (HCC)   . Coronary artery disease    STEMI   Past Surgical History:  Procedure Laterality Date  . ABDOMINAL EXPLORATION SURGERY    . CORONARY/GRAFT ACUTE MI REVASCULARIZATION N/A 07/28/2020   Procedure: CORONARY/GRAFT ACUTE MI REVASCULARIZATION;  Surgeon: Alwyn Pea, MD;  Location: [facility];  Service: Cardiovascular;  Laterality: N/A;  . LEFT HEART CATH AND CORONARY ANGIOGRAPHY N/A 07/28/2020   Procedure: LEFT HEART CATH AND CORONARY ANGIOGRAPHY;  Surgeon: Alwyn Pea, MD;  Location: [facility];  Service: Cardiovascular;  Laterality: N/A;   Family History  Problem Relation Age of Onset  . Alcohol abuse Father   . Alcohol abuse Brother   . Aneurysm Brother    Social History   Tobacco Use  . Smoking status: Former Smoker    Packs/day: 0.50    Years: 47.00    Pack years: 23.50  . Smokeless tobacco: Never Used  Substance Use Topics  . Alcohol use: Not Currently   Allergies  Allergen Reactions  . Codeine   . Codone [Hydrocodone] Other (See Comments)    Abdominal pain  . Levaquin [Levofloxacin] Swelling   Prior to Admission medications   Medication Sig Start Date End Date Taking? Authorizing Provider  aspirin EC 81 MG EC tablet Take 1 tablet (81 mg total) by mouth daily. Swallow whole. 07/31/20  Yes Cipriano Bunker, MD  atorvastatin (LIPITOR) 80 MG tablet Take 1 tablet (80 mg total) by mouth  daily. 07/31/20  Yes Cipriano Bunker, MD  clopidogrel (PLAVIX) 75 MG tablet Take by mouth. 08/08/20 08/08/21 Yes [provider]  lisinopril (ZESTRIL) 5 MG tablet Take 1 tablet (5 mg total) by mouth daily. 07/31/20  Yes Cipriano Bunker, MD  metoprolol tartrate (LOPRESSOR) 25 MG tablet Take 0.5 tablets (12.5 mg total) by mouth 2 (two) times daily. 07/30/20  Yes Cipriano Bunker, MD  nitroGLYCERIN (NITROSTAT) 0.4 MG SL tablet Place 1 tablet (0.4 mg total) under the tongue every 5 (five) minutes as needed for chest pain. 07/30/20   Yes Cipriano Bunker, MD  PROAIR HFA 108 [phone] Base) MCG/ACT inhaler Inhale 2 puffs into the lungs every 4 (four) hours as needed. 06/24/20  Yes [name], Jolene T, NP  SYMBICORT 160-4.5 MCG/ACT inhaler Inhale 2 puffs into the lungs 2 (two) times daily. 04/28/20  Yes [name], NP    Review of Systems  Constitutional: Positive for fatigue. Negative for appetite change.  HENT: Negative for congestion, postnasal drip and sore throat.   Eyes: Negative.   Respiratory: Positive for cough and shortness of breath ("very little").   Cardiovascular: Negative for chest pain, palpitations and leg swelling.  Gastrointestinal: Negative for abdominal distention and abdominal pain.  Endocrine: Negative.   Genitourinary: Negative.   Musculoskeletal: Positive for arthralgias (rib pain).  Allergic/Immunologic: Negative.   Neurological: Negative for dizziness and light-headedness.  Hematological: Negative for adenopathy. Does not bruise/bleed easily.  Psychiatric/Behavioral: Negative for dysphoric mood and sleep disturbance (sleeping on 1 pillow). The patient is not nervous/anxious.    Vitals:   08/16/20 1256  BP: (!) 127/98  Pulse: 64  Resp: 18  SpO2: 98%  Weight: 152 lb 8 oz (69.2 kg)  Height: 5\' 7"  (1.702 m)   Wt Readings from Last 3 Encounters:  08/16/20 152 lb 8 oz (69.2 kg)  08/10/20 150 lb 3.2 oz (68.1 kg)  07/30/20 148 lb 11.2 oz (67.4 kg)   Lab Results  Component Value Date   CREATININE 0.70 (L) 08/10/2020   CREATININE 0.58 (L) 07/30/2020   CREATININE 0.71 07/28/2020   Physical Exam Vitals and nursing note reviewed.  Constitutional:      Appearance: Normal appearance.  HENT:     Head: Normocephalic and atraumatic.  Cardiovascular:     Rate and Rhythm: Normal rate and regular rhythm.  Pulmonary:     Effort: Pulmonary effort is normal. No respiratory distress.     Breath sounds: No wheezing or rales.  Abdominal:     General: There is no distension.     Palpations: Abdomen  is soft.  Musculoskeletal:        General: No tenderness.     Cervical back: Normal range of motion and neck supple.     Right lower leg: No edema.     Left lower leg: No edema.  Skin:    General: Skin is warm and dry.  Neurological:     General: No focal deficit present.     Mental Status: He is alert and oriented to person, place, and time.  Psychiatric:        Mood and Affect: Mood normal.        Behavior: Behavior normal.        Thought Content: Thought content normal.    Assessment & Plan:  1: Chronic heart failure with mildly reduced ejection fraction- - NYHA class II - euvolemic today - has scales at home; instructed to weigh every morning and call for an overnight weight gain of >  2 pounds or a weekly weight gain of > 5 pounds - not adding salt and has started reading food labels for sodium content; reviewed the importance of keeping daily sodium intake to ~ 2000mg / day and written information put on his AVS - saw cardiology (Fath) 08/08/20 - should EF remain low, consider changing lisinopril to entresto - most likely do an echo in a few months - BNP 07/28/20 was 37.9  2: COPD- - saw PCP 07/30/20) 08/10/20 - BMP 08/10/20 reviewed and showed sodium 137, potassium 4.7, creatinine 0.7 and GFR 98 - has not smoked since his recent admission (07/28/20) and he was congratulated on that  3: STEMI- - recent stent placement - has been referred already to cardiac rehab; advised him to call them if he hasn't heard anything by the end of this week   Medication list reviewed.   Return in 3 months or sooner for any questions/problems before then.

## 2020-08-16 ENCOUNTER — Ambulatory Visit: Payer: Medicare Other | Attending: Family | Admitting: Family

## 2020-08-16 ENCOUNTER — Encounter: Payer: Self-pay | Admitting: Family

## 2020-08-16 ENCOUNTER — Other Ambulatory Visit: Payer: Self-pay

## 2020-08-16 VITALS — BP 127/98 | HR 64 | Resp 18 | Ht 67.0 in | Wt 152.5 lb

## 2020-08-16 DIAGNOSIS — Z7982 Long term (current) use of aspirin: Secondary | ICD-10-CM | POA: Diagnosis not present

## 2020-08-16 DIAGNOSIS — J449 Chronic obstructive pulmonary disease, unspecified: Secondary | ICD-10-CM | POA: Diagnosis not present

## 2020-08-16 DIAGNOSIS — Z885 Allergy status to narcotic agent status: Secondary | ICD-10-CM | POA: Diagnosis not present

## 2020-08-16 DIAGNOSIS — Z881 Allergy status to other antibiotic agents status: Secondary | ICD-10-CM | POA: Diagnosis not present

## 2020-08-16 DIAGNOSIS — Z8674 Personal history of sudden cardiac arrest: Secondary | ICD-10-CM | POA: Diagnosis not present

## 2020-08-16 DIAGNOSIS — I2510 Atherosclerotic heart disease of native coronary artery without angina pectoris: Secondary | ICD-10-CM | POA: Insufficient documentation

## 2020-08-16 DIAGNOSIS — I5022 Chronic systolic (congestive) heart failure: Secondary | ICD-10-CM | POA: Diagnosis not present

## 2020-08-16 DIAGNOSIS — Z7902 Long term (current) use of antithrombotics/antiplatelets: Secondary | ICD-10-CM | POA: Insufficient documentation

## 2020-08-16 DIAGNOSIS — Z955 Presence of coronary angioplasty implant and graft: Secondary | ICD-10-CM | POA: Diagnosis not present

## 2020-08-16 DIAGNOSIS — M1990 Unspecified osteoarthritis, unspecified site: Secondary | ICD-10-CM | POA: Diagnosis not present

## 2020-08-16 DIAGNOSIS — I252 Old myocardial infarction: Secondary | ICD-10-CM | POA: Diagnosis not present

## 2020-08-16 DIAGNOSIS — Z7951 Long term (current) use of inhaled steroids: Secondary | ICD-10-CM | POA: Insufficient documentation

## 2020-08-16 DIAGNOSIS — Z87891 Personal history of nicotine dependence: Secondary | ICD-10-CM | POA: Insufficient documentation

## 2020-08-16 DIAGNOSIS — I2111 ST elevation (STEMI) myocardial infarction involving right coronary artery: Secondary | ICD-10-CM

## 2020-08-16 NOTE — Patient Instructions (Addendum)
Begin weighing daily and call for an overnight weight gain of > 2 pounds or a weekly weight gain of >5 pounds.    Low-Sodium Eating Plan Sodium, which is an element that makes up salt, helps you maintain a healthy balance of fluids in your body. Too much sodium can increase your blood pressure and cause fluid and waste to be held in your body. Your health care provider or dietitian may recommend following this plan if you have high blood pressure (hypertension), kidney disease, liver disease, or heart failure. Eating less sodium can help lower your blood pressure, reduce swelling, and protect your heart, liver, and kidneys. What are tips for following this plan? Reading food labels  The Nutrition Facts label lists the amount of sodium in one serving of the food. If you eat more than one serving, you must multiply the listed amount of sodium by the number of servings.  Choose foods with less than 140 mg of sodium per serving.  Avoid foods with 300 mg of sodium or more per serving. Shopping  Look for lower-sodium products, often labeled as "low-sodium" or "no salt added."  Always check the sodium content, even if foods are labeled as "unsalted" or "no salt added."  Buy fresh foods. ? Avoid canned foods and pre-made or frozen meals. ? Avoid canned, cured, or processed meats.  Buy breads that have less than 80 mg of sodium per slice.   Cooking  Eat more home-cooked food and less restaurant, buffet, and fast food.  Avoid adding salt when cooking. Use salt-free seasonings or herbs instead of table salt or sea salt. Check with your health care provider or pharmacist before using salt substitutes.  Cook with plant-based oils, such as canola, sunflower, or olive oil.   Meal planning  When eating at a restaurant, ask that your food be prepared with less salt or no salt, if possible. Avoid dishes labeled as brined, pickled, cured, smoked, or made with soy sauce, miso, or teriyaki  sauce.  Avoid foods that contain MSG (monosodium glutamate). MSG is sometimes added to Chinese food, bouillon, and some canned foods.  Make meals that can be grilled, baked, poached, roasted, or steamed. These are generally made with less sodium. General information Most people on this plan should limit their sodium intake to 1,500-2,000 mg (milligrams) of sodium each day. What foods should I eat? Fruits Fresh, frozen, or canned fruit. Fruit juice. Vegetables Fresh or frozen vegetables. "No salt added" canned vegetables. "No salt added" tomato sauce and paste. Low-sodium or reduced-sodium tomato and vegetable juice. Grains Low-sodium cereals, including oats, puffed wheat and rice, and shredded wheat. Low-sodium crackers. Unsalted rice. Unsalted pasta. Low-sodium bread. Whole-grain breads and whole-grain pasta. Meats and other proteins Fresh or frozen (no salt added) meat, poultry, seafood, and fish. Low-sodium canned tuna and salmon. Unsalted nuts. Dried peas, beans, and lentils without added salt. Unsalted canned beans. Eggs. Unsalted nut butters. Dairy Milk. Soy milk. Cheese that is naturally low in sodium, such as ricotta cheese, fresh mozzarella, or Swiss cheese. Low-sodium or reduced-sodium cheese. Cream cheese. Yogurt. Seasonings and condiments Fresh and dried herbs and spices. Salt-free seasonings. Low-sodium mustard and ketchup. Sodium-free salad dressing. Sodium-free light mayonnaise. Fresh or refrigerated horseradish. Lemon juice. Vinegar. Other foods Homemade, reduced-sodium, or low-sodium soups. Unsalted popcorn and pretzels. Low-salt or salt-free chips. The items listed above may not be a complete list of foods and beverages you can eat. Contact a dietitian for more information. What foods should I avoid? Vegetables   Sauerkraut, pickled vegetables, and relishes. Olives. French fries. Onion rings. Regular canned vegetables (not low-sodium or reduced-sodium). Regular canned tomato  sauce and paste (not low-sodium or reduced-sodium). Regular tomato and vegetable juice (not low-sodium or reduced-sodium). Frozen vegetables in sauces. Grains Instant hot cereals. Bread stuffing, pancake, and biscuit mixes. Croutons. Seasoned rice or pasta mixes. Noodle soup cups. Boxed or frozen macaroni and cheese. Regular salted crackers. Self-rising flour. Meats and other proteins Meat or fish that is salted, canned, smoked, spiced, or pickled. Precooked or cured meat, such as sausages or meat loaves. Bacon. Ham. Pepperoni. Hot dogs. Corned beef. Chipped beef. Salt pork. Jerky. Pickled herring. Anchovies and sardines. Regular canned tuna. Salted nuts. Dairy Processed cheese and cheese spreads. Hard cheeses. Cheese curds. Blue cheese. Feta cheese. String cheese. Regular cottage cheese. Buttermilk. Canned milk. Fats and oils Salted butter. Regular margarine. Ghee. Bacon fat. Seasonings and condiments Onion salt, garlic salt, seasoned salt, table salt, and sea salt. Canned and packaged gravies. Worcestershire sauce. Tartar sauce. Barbecue sauce. Teriyaki sauce. Soy sauce, including reduced-sodium. Steak sauce. Fish sauce. Oyster sauce. Cocktail sauce. Horseradish that you find on the shelf. Regular ketchup and mustard. Meat flavorings and tenderizers. Bouillon cubes. Hot sauce. Pre-made or packaged marinades. Pre-made or packaged taco seasonings. Relishes. Regular salad dressings. Salsa. Other foods Salted popcorn and pretzels. Corn chips and puffs. Potato and tortilla chips. Canned or dried soups. Pizza. Frozen entrees and pot pies. The items listed above may not be a complete list of foods and beverages you should avoid. Contact a dietitian for more information. Summary  Eating less sodium can help lower your blood pressure, reduce swelling, and protect your heart, liver, and kidneys.  Most people on this plan should limit their sodium intake to 1,500-2,000 mg (milligrams) of sodium each  day.  Canned, boxed, and frozen foods are high in sodium. Restaurant foods, fast foods, and pizza are also very high in sodium. You also get sodium by adding salt to food.  Try to cook at home, eat more fresh fruits and vegetables, and eat less fast food and canned, processed, or prepared foods. This information is not intended to replace advice given to you by your health care provider. Make sure you discuss any questions you have with your health care provider. Document Revised: 08/07/2019 Document Reviewed: 06/03/2019 Elsevier Patient Education  2021 [employer]  

## 2020-08-18 ENCOUNTER — Encounter: Payer: Medicare Other | Attending: Cardiology

## 2020-08-18 ENCOUNTER — Other Ambulatory Visit: Payer: Self-pay

## 2020-08-18 DIAGNOSIS — I213 ST elevation (STEMI) myocardial infarction of unspecified site: Secondary | ICD-10-CM

## 2020-08-18 DIAGNOSIS — I252 Old myocardial infarction: Secondary | ICD-10-CM | POA: Insufficient documentation

## 2020-08-18 NOTE — Progress Notes (Signed)
Virtual Visit completed. Patient informed on EP and RD appointment and 6 Minute walk test. Patient also informed of patient health questionnaires on My Chart. Patient Verbalizes understanding. Visit diagnosis can be found in [facility] 05/18/2021. 

## 2020-08-24 ENCOUNTER — Other Ambulatory Visit: Payer: Self-pay

## 2020-08-24 ENCOUNTER — Encounter: Payer: Self-pay | Admitting: *Deleted

## 2020-08-24 ENCOUNTER — Encounter: Payer: Medicare Other | Admitting: *Deleted

## 2020-08-24 VITALS — Ht 68.1 in | Wt 152.2 lb

## 2020-08-24 DIAGNOSIS — I213 ST elevation (STEMI) myocardial infarction of unspecified site: Secondary | ICD-10-CM

## 2020-08-24 DIAGNOSIS — I252 Old myocardial infarction: Secondary | ICD-10-CM | POA: Diagnosis not present

## 2020-08-24 NOTE — Patient Instructions (Signed)
Patient Instructions  Patient Details  Name: Andrew Potts MRN: [id number] Date of Birth: 10-20-1950 Referring Provider:  Dalia Heading, MD  Below are your personal goals for exercise, nutrition, and risk factors. Our goal is to help you stay on track towards obtaining and maintaining these goals. We will be discussing your progress on these goals with you throughout the program.  Initial Exercise Prescription:  Initial Exercise Prescription - 08/24/20 1300      Date of Initial Exercise RX and Referring Provider   Date 08/24/20    Referring Provider Harold Hedge MD      Treadmill   MPH 2.6    Grade 1    Minutes 15    METs 3.35      REL-XR   Level 2    Speed 50    Minutes 15    METs 3      T5 Nustep   Level 2    SPM 80    Minutes 15    METs 3      Prescription Details   Frequency (times per week) 2    Duration Progress to 30 minutes of continuous aerobic without signs/symptoms of physical distress      Intensity   THRR 40-80% of Max Heartrate 104-137    Ratings of Perceived Exertion 11-13    Perceived Dyspnea 0-4      Progression   Progression Continue to progress workloads to maintain intensity without signs/symptoms of physical distress.      Resistance Training   Training Prescription Yes    Weight 4 lb    Reps 10-15           Exercise Goals: Frequency: Be able to perform aerobic exercise two to three times per week in program working toward 2-5 days per week of home exercise.  Intensity: Work with a perceived exertion of 11 (fairly light) - 15 (hard) while following your exercise prescription.  We will make changes to your prescription with you as you progress through the program.   Duration: Be able to do 30 to 45 minutes of continuous aerobic exercise in addition to a 5 minute warm-up and a 5 minute cool-down routine.   Nutrition Goals: Your personal nutrition goals will be established when you do your nutrition analysis with the  dietician.  The following are general nutrition guidelines to follow: Cholesterol < 200mg /day Sodium < 1500mg /day Fiber: Men over 50 yrs - 30 grams per day  Personal Goals:  Personal Goals and Risk Factors at Admission - 08/24/20 1330      Core Components/Risk Factors/Patient Goals on Admission    Weight Management Yes;Weight Maintenance    Intervention Weight Management: Develop a combined nutrition and exercise program designed to reach desired caloric intake, while maintaining appropriate intake of nutrient and fiber, sodium and fats, and appropriate energy expenditure required for the weight goal.;Weight Management: Provide education and appropriate resources to help participant work on and attain dietary goals.;Weight Management/Obesity: Establish reasonable short term and long term weight goals.    Admit Weight 152 lb 3.2 oz (69 kg)    Goal Weight: Short Term 152 lb (68.9 kg)    Goal Weight: Long Term 152 lb (68.9 kg)    Expected Outcomes Short Term: Continue to assess and modify interventions until short term weight is achieved;Long Term: Adherence to nutrition and physical activity/exercise program aimed toward attainment of established weight goal;Weight Maintenance: Understanding of the daily nutrition guidelines, which includes 25-35% calories from fat, 7%  or less cal from saturated fats, less than 200mg  cholesterol, less than 1.5gm of sodium, & 5 or more servings of fruits and vegetables daily    Tobacco Cessation Yes   Quit 07/28/2020   Number of packs per day 0    Intervention Assist the participant in steps to quit. Provide individualized education and counseling about committing to Tobacco Cessation, relapse prevention, and pharmacological support that can be provided by physician.;07/30/2020, assist with locating and accessing local/national Quit Smoking programs, and support quit date choice.    Expected Outcomes Short Term: Will demonstrate readiness to quit,  by selecting a quit date.;Short Term: Will quit all tobacco product use, adhering to prevention of relapse plan.;Long Term: Complete abstinence from all tobacco products for at least 12 months from quit date.    Lipids Yes    Intervention Provide education and support for participant on nutrition & aerobic/resistive exercise along with prescribed medications to achieve LDL 70mg , HDL >40mg .    Expected Outcomes Short Term: Participant states understanding of desired cholesterol values and is compliant with medications prescribed. Participant is following exercise prescription and nutrition guidelines.;Long Term: Cholesterol controlled with medications as prescribed, with individualized exercise RX and with personalized nutrition plan. Value goals: LDL < 70mg , HDL > 40 mg.           Tobacco Use Initial Evaluation: Social History   Tobacco Use  Smoking Status Former Smoker  . Packs/day: 0.50  . Years: 47.00  . Pack years: 23.50  . Types: Cigarettes  . Quit date: 07/28/2020  . Years since quitting: 0.0  Smokeless Tobacco Never Used  Tobacco Comment   08/24/20 given quit smoking paperwork and encouraged continued cessation    Exercise Goals and Review:  Exercise Goals    Row Name 08/24/20 1328             Exercise Goals   Increase Physical Activity Yes       Intervention Provide advice, education, support and counseling about physical activity/exercise needs.;Develop an individualized exercise prescription for aerobic and resistive training based on initial evaluation findings, risk stratification, comorbidities and participant's personal goals.       Expected Outcomes Short Term: Attend rehab on a regular basis to increase amount of physical activity.;Long Term: Add in home exercise to make exercise part of routine and to increase amount of physical activity.;Long Term: Exercising regularly at least 3-5 days a week.       Increase Strength and Stamina Yes       Intervention Provide  advice, education, support and counseling about physical activity/exercise needs.;Develop an individualized exercise prescription for aerobic and resistive training based on initial evaluation findings, risk stratification, comorbidities and participant's personal goals.       Expected Outcomes Short Term: Increase workloads from initial exercise prescription for resistance, speed, and METs.;Short Term: Perform resistance training exercises routinely during rehab and add in resistance training at home;Long Term: Improve cardiorespiratory fitness, muscular endurance and strength as measured by increased METs and functional capacity (10/22/20)       Able to understand and use rate of perceived exertion (RPE) scale Yes       Intervention Provide education and explanation on how to use RPE scale       Expected Outcomes Short Term: Able to use RPE daily in rehab to express subjective intensity level;Long Term:  Able to use RPE to guide intensity level when exercising independently       Able to understand and use  Dyspnea scale Yes       Intervention Provide education and explanation on how to use Dyspnea scale       Expected Outcomes Short Term: Able to use Dyspnea scale daily in rehab to express subjective sense of shortness of breath during exertion;Long Term: Able to use Dyspnea scale to guide intensity level when exercising independently       Knowledge and understanding of Target Heart Rate Range (THRR) Yes       Intervention Provide education and explanation of THRR including how the numbers were predicted and where they are located for reference       Expected Outcomes Short Term: Able to state/look up THRR;Short Term: Able to use daily as guideline for intensity in rehab;Long Term: Able to use THRR to govern intensity when exercising independently       Able to check pulse independently Yes       Intervention Provide education and demonstration on how to check pulse in carotid and radial arteries.;Review  the importance of being able to check your own pulse for safety during independent exercise       Expected Outcomes Short Term: Able to explain why pulse checking is important during independent exercise;Long Term: Able to check pulse independently and accurately       Understanding of Exercise Prescription Yes       Intervention Provide education, explanation, and written materials on patient's individual exercise prescription       Expected Outcomes Short Term: Able to explain program exercise prescription;Long Term: Able to explain home exercise prescription to exercise independently              Copy of goals given to participant.

## 2020-08-24 NOTE — Progress Notes (Signed)
Henson has recently quit tobacco use within the last 6 months. Intervention for relapse prevention was provided at the initial medical review. He was encouraged to continue to with tobacco cessation and was provided information on relapse prevention. Patient received information about combination therapy, tobacco cessation classes, quit line, and quit smoking apps in case of a relapse. Patient demonstrated understanding of this material.Staff will continue to provide encouragement and follow up with the patient throughout the program.

## 2020-08-24 NOTE — Progress Notes (Signed)
Cardiac Individual Treatment Plan  Patient Details  Name: Andrew Potts MRN: [id number] Date of Birth: Jun 22, 1951 Referring Provider:   Flowsheet Row Cardiac Rehab from 08/24/2020 in [facility] Cardiac and Pulmonary Rehab  Referring Provider Harold Hedge MD      Initial Encounter Date:  Flowsheet Row Cardiac Rehab from 08/24/2020 in [facility] Cardiac and Pulmonary Rehab  Date 08/24/20      Visit Diagnosis: ST elevation myocardial infarction (STEMI), unspecified artery (HCC)  Patient's Home Medications on Admission:  Current Outpatient Medications:  .  aspirin EC 81 MG EC tablet, Take 1 tablet (81 mg total) by mouth daily. Swallow whole., Disp: 30 tablet, Rfl: 11 .  atorvastatin (LIPITOR) 80 MG tablet, Take 1 tablet (80 mg total) by mouth daily., Disp: 90 tablet, Rfl: 1 .  clopidogrel (PLAVIX) 75 MG tablet, Take by mouth., Disp: , Rfl:  .  lisinopril (ZESTRIL) 5 MG tablet, Take 1 tablet (5 mg total) by mouth daily., Disp: 30 tablet, Rfl: 1 .  metoprolol tartrate (LOPRESSOR) 25 MG tablet, Take 0.5 tablets (12.5 mg total) by mouth 2 (two) times daily., Disp: 60 tablet, Rfl: 1 .  nitroGLYCERIN (NITROSTAT) 0.4 MG SL tablet, Place 1 tablet (0.4 mg total) under the tongue every 5 (five) minutes as needed for chest pain., Disp: 30 tablet, Rfl: 12 .  PROAIR HFA 108 (90 Base) MCG/ACT inhaler, Inhale 2 puffs into the lungs every 4 (four) hours as needed., Disp: 18 g, Rfl: 4 .  SYMBICORT 160-4.5 MCG/ACT inhaler, Inhale 2 puffs into the lungs 2 (two) times daily., Disp: 10.2 g, Rfl: 5  Past Medical History: Past Medical History:  Diagnosis Date  . Arthritis   . CHF (congestive heart failure) (HCC)   . COPD (chronic obstructive pulmonary disease) (HCC)   . Coronary artery disease    STEMI    Tobacco Use: Social History   Tobacco Use  Smoking Status Former Smoker  . Packs/day: 0.50  . Years: 47.00  . Pack years: 23.50  . Types: Cigarettes  . Quit date: 07/28/2020  . Years since quitting: 0.0   Smokeless Tobacco Never Used  Tobacco Comment   08/24/20 given quit smoking paperwork and encouraged continued cessation    Labs: Recent Review Flowsheet Data    Labs for ITP Cardiac and Pulmonary Rehab Latest Ref Rng & Units 09/16/2019 05/03/2020 07/28/2020 07/29/2020 08/10/2020   Cholestrol 100 - 199 mg/dL 102 725 - 366 440   LDLCALC 0 - 99 mg/dL 347(Q) 259(D) - 638(V) 61   HDL >39 mg/dL 48 40 - 56(E) 42   Trlycerides 0 - 149 mg/dL 332(R) 518 - 74 66   Hemoglobin A1c 4.8 - 5.6 % - - - 5.2 -   HCO3 20.0 - 28.0 mmol/L - - 24.8 - -   ACIDBASEDEF 0.0 - 2.0 mmol/L - - 1.7 - -   O2SAT % - - 82.0 - -       Exercise Target Goals: Exercise Program Goal: Individual exercise prescription set using results from initial 6 min walk test and THRR while considering  patient's activity barriers and safety.   Exercise Prescription Goal: Initial exercise prescription builds to 30-45 minutes a day of aerobic activity, 2-3 days per week.  Home exercise guidelines will be given to patient during program as part of exercise prescription that the participant will acknowledge.   Education: Aerobic Exercise: - Group verbal and visual presentation on the components of exercise prescription. Introduces F.I.T.T principle from ACSM for exercise prescriptions.  Reviews  F.I.T.T. principles of aerobic exercise including progression. Written material given at graduation. Flowsheet Row Cardiac Rehab from 08/24/2020 in [facility] Cardiac and Pulmonary Rehab  Education need identified 08/24/20      Education: Resistance Exercise: - Group verbal and visual presentation on the components of exercise prescription. Introduces F.I.T.T principle from ACSM for exercise prescriptions  Reviews F.I.T.T. principles of resistance exercise including progression. Written material given at graduation.    Education: Exercise & Equipment Safety: - Individual verbal instruction and demonstration of equipment use and safety with use of the  equipment. Flowsheet Row Cardiac Rehab from 08/24/2020 in [facility] Cardiac and Pulmonary Rehab  Date 08/18/20  Educator [facility]  Instruction Review Code 1- Verbalizes Understanding      Education: Exercise Physiology & General Exercise Guidelines: - Group verbal and written instruction with models to review the exercise physiology of the cardiovascular system and associated critical values. Provides general exercise guidelines with specific guidelines to those with heart or lung disease.    Education: Flexibility, Balance, Mind/Body Relaxation: - Group verbal and visual presentation with interactive activity on the components of exercise prescription. Introduces F.I.T.T principle from ACSM for exercise prescriptions. Reviews F.I.T.T. principles of flexibility and balance exercise training including progression. Also discusses the mind body connection.  Reviews various relaxation techniques to help reduce and manage stress (i.e. Deep breathing, progressive muscle relaxation, and visualization). Balance handout provided to take home. Written material given at graduation.   Activity Barriers & Risk Stratification:  Activity Barriers & Cardiac Risk Stratification - 08/24/20 1326      Activity Barriers & Cardiac Risk Stratification   Activity Barriers Muscular Weakness;Deconditioning;Shortness of Breath;Joint Problems;Other (comment)    Comments chronic hip pain from working, residual chest soreness from CPR    Cardiac Risk Stratification High           6 Minute Walk:  6 Minute Walk    Row Name 08/24/20 1325         6 Minute Walk   Phase Initial     Distance 1395 feet     Walk Time 6 minutes     # of Rest Breaks 0     MPH 2.64     METS 3.94     RPE 9     Perceived Dyspnea  1     VO2 Peak 13.8     Symptoms Yes (comment)     Comments chronic hip pain 4/10, SOB     Resting HR 71 bpm     Resting BP 142/70     Exercise Oxygen Saturation  during 6 min walk 97 %     Andrew Potts Ex. HR 114 bpm      Andrew Potts Ex. BP 166/84     2 Minute Post BP 124/76            Oxygen Initial Assessment:   Oxygen Re-Evaluation:   Oxygen Discharge (Final Oxygen Re-Evaluation):   Initial Exercise Prescription:  Initial Exercise Prescription - 08/24/20 1300      Date of Initial Exercise RX and Referring Provider   Date 08/24/20    Referring Provider Harold Hedge MD      Treadmill   MPH 2.6    Grade 1    Minutes 15    METs 3.35      REL-XR   Level 2    Speed 50    Minutes 15    METs 3      T5 Nustep   Level 2  SPM 80    Minutes 15    METs 3      Prescription Details   Frequency (times per week) 2    Duration Progress to 30 minutes of continuous aerobic without signs/symptoms of physical distress      Intensity   THRR 40-80% of Andrew Potts Heartrate 104-137    Ratings of Perceived Exertion 11-13    Perceived Dyspnea 0-4      Progression   Progression Continue to progress workloads to maintain intensity without signs/symptoms of physical distress.      Resistance Training   Training Prescription Yes    Weight 4 lb    Reps 10-15           Perform Capillary Blood Glucose checks as needed.  Exercise Prescription Changes:  Exercise Prescription Changes    Row Name 08/24/20 1300             Response to Exercise   Blood Pressure (Admit) 142/70       Blood Pressure (Exercise) 166/84       Blood Pressure (Exit) 124/76       Heart Rate (Admit) 71 bpm       Heart Rate (Exercise) 114 bpm       Heart Rate (Exit) 82 bpm       Oxygen Saturation (Exercise) 97 %       Rating of Perceived Exertion (Exercise) 9       Perceived Dyspnea (Exercise) 1       Symptoms SOB, chronic hip pain 4/10       Comments walk test results              Exercise Comments:   Exercise Goals and Review:  Exercise Goals    Row Name 08/24/20 1328             Exercise Goals   Increase Physical Activity Yes       Intervention Provide advice, education, support and counseling about  physical activity/exercise needs.;Develop an individualized exercise prescription for aerobic and resistive training based on initial evaluation findings, risk stratification, comorbidities and participant's personal goals.       Expected Outcomes Short Term: Attend rehab on a regular basis to increase amount of physical activity.;Long Term: Add in home exercise to make exercise part of routine and to increase amount of physical activity.;Long Term: Exercising regularly at least 3-5 days a week.       Increase Strength and Stamina Yes       Intervention Provide advice, education, support and counseling about physical activity/exercise needs.;Develop an individualized exercise prescription for aerobic and resistive training based on initial evaluation findings, risk stratification, comorbidities and participant's personal goals.       Expected Outcomes Short Term: Increase workloads from initial exercise prescription for resistance, speed, and METs.;Short Term: Perform resistance training exercises routinely during rehab and add in resistance training at home;Long Term: Improve cardiorespiratory fitness, muscular endurance and strength as measured by increased METs and functional capacity (6MWT)       Able to understand and use rate of perceived exertion (RPE) scale Yes       Intervention Provide education and explanation on how to use RPE scale       Expected Outcomes Short Term: Able to use RPE daily in rehab to express subjective intensity level;Long Term:  Able to use RPE to guide intensity level when exercising independently       Able to understand and use Dyspnea scale Yes  Intervention Provide education and explanation on how to use Dyspnea scale       Expected Outcomes Short Term: Able to use Dyspnea scale daily in rehab to express subjective sense of shortness of breath during exertion;Long Term: Able to use Dyspnea scale to guide intensity level when exercising independently       Knowledge  and understanding of Target Heart Rate Range (THRR) Yes       Intervention Provide education and explanation of THRR including how the numbers were predicted and where they are located for reference       Expected Outcomes Short Term: Able to state/look up THRR;Short Term: Able to use daily as guideline for intensity in rehab;Long Term: Able to use THRR to govern intensity when exercising independently       Able to check pulse independently Yes       Intervention Provide education and demonstration on how to check pulse in carotid and radial arteries.;Review the importance of being able to check your own pulse for safety during independent exercise       Expected Outcomes Short Term: Able to explain why pulse checking is important during independent exercise;Long Term: Able to check pulse independently and accurately       Understanding of Exercise Prescription Yes       Intervention Provide education, explanation, and written materials on patient's individual exercise prescription       Expected Outcomes Short Term: Able to explain program exercise prescription;Long Term: Able to explain home exercise prescription to exercise independently              Exercise Goals Re-Evaluation :   Discharge Exercise Prescription (Final Exercise Prescription Changes):  Exercise Prescription Changes - 08/24/20 1300      Response to Exercise   Blood Pressure (Admit) 142/70    Blood Pressure (Exercise) 166/84    Blood Pressure (Exit) 124/76    Heart Rate (Admit) 71 bpm    Heart Rate (Exercise) 114 bpm    Heart Rate (Exit) 82 bpm    Oxygen Saturation (Exercise) 97 %    Rating of Perceived Exertion (Exercise) 9    Perceived Dyspnea (Exercise) 1    Symptoms SOB, chronic hip pain 4/10    Comments walk test results           Nutrition:  Target Goals: Understanding of nutrition guidelines, daily intake of sodium 1500mg , cholesterol 200mg , calories 30% from fat and 7% or less from saturated fats,  daily to have 5 or more servings of fruits and vegetables.  Education: All About Nutrition: -Group instruction provided by verbal, written material, interactive activities, discussions, models, and posters to present general guidelines for heart healthy nutrition including fat, fiber, MyPlate, the role of sodium in heart healthy nutrition, utilization of the nutrition label, and utilization of this knowledge for meal planning. Follow up email sent as well. Written material given at graduation. Flowsheet Row Cardiac Rehab from 08/24/2020 in [facility] Cardiac and Pulmonary Rehab  Education need identified 08/24/20      Biometrics:  Pre Biometrics - 08/24/20 1329      Pre Biometrics   Height 5' 8.1" (1.73 m)    Weight 152 lb 3.2 oz (69 kg)    BMI (Calculated) 23.07    Single Leg Stand 28.11 seconds            Nutrition Therapy Plan and Nutrition Goals:  Nutrition Therapy & Goals - 08/24/20 1329      Intervention Plan  Intervention Prescribe, educate and counsel regarding individualized specific dietary modifications aiming towards targeted core components such as weight, hypertension, lipid management, diabetes, heart failure and other comorbidities.    Expected Outcomes Short Term Goal: Understand basic principles of dietary content, such as calories, fat, sodium, cholesterol and nutrients.;Short Term Goal: A plan has been developed with personal nutrition goals set during dietitian appointment.;Long Term Goal: Adherence to prescribed nutrition plan.           Nutrition Assessments:  MEDIFICTS Score Key:  ?70 Need to make dietary changes   40-70 Heart Healthy Diet  ? 40 Therapeutic Level Cholesterol Diet  Flowsheet Row Cardiac Rehab from 08/24/2020 in [facility] Cardiac and Pulmonary Rehab  Picture Your Plate Total Score on Admission 57     Picture Your Plate Scores:  <69 Unhealthy dietary pattern with much room for improvement.  41-50 Dietary pattern unlikely to meet  recommendations for good health and room for improvement.  51-60 More healthful dietary pattern, with some room for improvement.   >60 Healthy dietary pattern, although there may be some specific behaviors that could be improved.    Nutrition Goals Re-Evaluation:   Nutrition Goals Discharge (Final Nutrition Goals Re-Evaluation):   Psychosocial: Target Goals: Acknowledge presence or absence of significant depression and/or stress, maximize coping skills, provide positive support system. Participant is able to verbalize types and ability to use techniques and skills needed for reducing stress and depression.   Education: Stress, Anxiety, and Depression - Group verbal and visual presentation to define topics covered.  Reviews how body is impacted by stress, anxiety, and depression.  Also discusses healthy ways to reduce stress and to treat/manage anxiety and depression.  Written material given at graduation.   Education: Sleep Hygiene -Provides group verbal and written instruction about how sleep can affect your health.  Define sleep hygiene, discuss sleep cycles and impact of sleep habits. Review good sleep hygiene tips.    Initial Review & Psychosocial Screening:  Initial Psych Review & Screening - 08/18/20 1009      Initial Review   Current issues with None Identified      Family Dynamics   Good Support System? Yes    Comments He talks to his daughter everyday. His boss is a great support system, works at [employer] and has been for 3 years. He has a positive outlook on his health.      Barriers   Psychosocial barriers to participate in program There are no identifiable barriers or psychosocial needs.;The patient should benefit from training in stress management and relaxation.           Quality of Life Scores:   Quality of Life - 08/24/20 1329      Quality of Life   Select Quality of Life      Quality of Life Scores   Health/Function Pre 22.5 %    Socioeconomic Pre 22.5  %    Psych/Spiritual Pre 22.5 %    Family Pre 22.5 %    GLOBAL Pre 22.5 %          Scores of 19 and below usually indicate a poorer quality of life in these areas.  A difference of  2-3 points is a clinically meaningful difference.  A difference of 2-3 points in the total score of the Quality of Life Index has been associated with significant improvement in overall quality of life, self-image, physical symptoms, and general health in studies assessing change in quality of life.  PHQ-9: Recent Review  Flowsheet Data    Depression screen [facility] 2/9 08/24/2020 08/10/2020 10/22/2019 03/10/2019 02/04/2019   Decreased Interest 0 0 0 0 0   Down, Depressed, Hopeless 0 0 0 0 0   PHQ - 2 Score 0 0 0 0 0   Altered sleeping 0 0 - 0 0   Tired, decreased energy 1 0 - 0 0   Change in appetite 0 0 - 0 0   Feeling bad or failure about yourself  0 0 - 0 0   Trouble concentrating 0 0 - 0 0   Moving slowly or fidgety/restless 0 0 - 0 0   Suicidal thoughts 0 0 - 0 0   PHQ-9 Score 1 0 - 0 0   Difficult doing work/chores Somewhat difficult - - Not difficult at all Not difficult at all     Interpretation of Total Score  Total Score Depression Severity:  1-4 = Minimal depression, 5-9 = Mild depression, 10-14 = Moderate depression, 15-19 = Moderately severe depression, 20-27 = Severe depression   Psychosocial Evaluation and Intervention:  Psychosocial Evaluation - 08/18/20 1014      Psychosocial Evaluation & Interventions   Interventions Encouraged to exercise with the program and follow exercise prescription;Relaxation education;Stress management education    Comments He talks to his daughter everyday. His boss is a great support system, works at [employer] and has been for 3 years. He has a positive outlook on his health.    Expected Outcomes Short: Exercise regularly to support mental health and notify staff of any changes. Long: maintain mental health and well being through teaching of rehab or prescribed  medications independently.    Continue Psychosocial Services  Follow up required by staff           Psychosocial Re-Evaluation:   Psychosocial Discharge (Final Psychosocial Re-Evaluation):   Vocational Rehabilitation: Provide vocational rehab assistance to qualifying candidates.   Vocational Rehab Evaluation & Intervention:   Education: Education Goals: Education classes will be provided on a variety of topics geared toward better understanding of heart health and risk factor modification. Participant will state understanding/return demonstration of topics presented as noted by education test scores.  Learning Barriers/Preferences:  Learning Barriers/Preferences - 08/18/20 1007      Learning Barriers/Preferences   Learning Barriers None    Learning Preferences None           General Cardiac Education Topics:  AED/CPR: - Group verbal and written instruction with the use of models to demonstrate the basic use of the AED with the basic ABC's of resuscitation.   Anatomy and Cardiac Procedures: - Group verbal and visual presentation and models provide information about basic cardiac anatomy and function. Reviews the testing methods done to diagnose heart disease and the outcomes of the test results. Describes the treatment choices: Medical Management, Angioplasty, or Coronary Bypass Surgery for treating various heart conditions including Myocardial Infarction, Angina, Valve Disease, and Cardiac Arrhythmias.  Written material given at graduation.   Medication Safety: - Group verbal and visual instruction to review commonly prescribed medications for heart and lung disease. Reviews the medication, class of the drug, and side effects. Includes the steps to properly store meds and maintain the prescription regimen.  Written material given at graduation.   Intimacy: - Group verbal instruction through game format to discuss how heart and lung disease can affect sexual intimacy.  Written material given at graduation..   Know Your Numbers and Heart Failure: - Group verbal and visual instruction to discuss disease  risk factors for cardiac and pulmonary disease and treatment options.  Reviews associated critical values for Overweight/Obesity, Hypertension, Cholesterol, and Diabetes.  Discusses basics of heart failure: signs/symptoms and treatments.  Introduces Heart Failure Zone chart for action plan for heart failure.  Written material given at graduation.   Infection Prevention: - Provides verbal and written material to individual with discussion of infection control including proper hand washing and proper equipment cleaning during exercise session. Flowsheet Row Cardiac Rehab from 08/24/2020 in [facility] Cardiac and Pulmonary Rehab  Date 08/18/20  Educator [facility]  Instruction Review Code 1- Verbalizes Understanding      Falls Prevention: - Provides verbal and written material to individual with discussion of falls prevention and safety. Flowsheet Row Cardiac Rehab from 08/24/2020 in [facility] Cardiac and Pulmonary Rehab  Date 08/18/20  Educator [facility]  Instruction Review Code 1- Verbalizes Understanding      Other: -Provides group and verbal instruction on various topics (see comments)   Knowledge Questionnaire Score:  Knowledge Questionnaire Score - 08/24/20 1330      Knowledge Questionnaire Score   Pre Score 24/26 Education Focus: Exercise, nutrition           Core Components/Risk Factors/Patient Goals at Admission:  Personal Goals and Risk Factors at Admission - 08/24/20 1330      Core Components/Risk Factors/Patient Goals on Admission    Weight Management Yes;Weight Maintenance    Intervention Weight Management: Develop a combined nutrition and exercise program designed to reach desired caloric intake, while maintaining appropriate intake of nutrient and fiber, sodium and fats, and appropriate energy expenditure required for the weight goal.;Weight Management:  Provide education and appropriate resources to help participant work on and attain dietary goals.;Weight Management/Obesity: Establish reasonable short term and long term weight goals.    Admit Weight 152 lb 3.2 oz (69 kg)    Goal Weight: Short Term 152 lb (68.9 kg)    Goal Weight: Long Term 152 lb (68.9 kg)    Expected Outcomes Short Term: Continue to assess and modify interventions until short term weight is achieved;Long Term: Adherence to nutrition and physical activity/exercise program aimed toward attainment of established weight goal;Weight Maintenance: Understanding of the daily nutrition guidelines, which includes 25-35% calories from fat, 7% or less cal from saturated fats, less than 200mg  cholesterol, less than 1.5gm of sodium, & 5 or more servings of fruits and vegetables daily    Tobacco Cessation Yes   Quit 07/28/2020   Number of packs per day 0    Intervention Assist the participant in steps to quit. Provide individualized education and counseling about committing to Tobacco Cessation, relapse prevention, and pharmacological support that can be provided by physician.;Education officer, environmental, assist with locating and accessing local/national Quit Smoking programs, and support quit date choice.    Expected Outcomes Short Term: Will demonstrate readiness to quit, by selecting a quit date.;Short Term: Will quit all tobacco product use, adhering to prevention of relapse plan.;Long Term: Complete abstinence from all tobacco products for at least 12 months from quit date.    Lipids Yes    Intervention Provide education and support for participant on nutrition & aerobic/resistive exercise along with prescribed medications to achieve LDL 70mg , HDL >40mg .    Expected Outcomes Short Term: Participant states understanding of desired cholesterol values and is compliant with medications prescribed. Participant is following exercise prescription and nutrition guidelines.;Long Term: Cholesterol  controlled with medications as prescribed, with individualized exercise RX and with personalized nutrition plan. Value goals: LDL < 70mg ,  HDL > 40 mg.           Education:Diabetes - Individual verbal and written instruction to review signs/symptoms of diabetes, desired ranges of glucose level fasting, after meals and with exercise. Acknowledge that pre and post exercise glucose checks will be done for 3 sessions at entry of program.   Core Components/Risk Factors/Patient Goals Review:   Goals and Risk Factor Review    Row Name 08/24/20 1332             Core Components/Risk Factors/Patient Goals Review   Personal Goals Review Tobacco Cessation       Review Maximum has recently quit tobacco use within the last 6 months. Intervention for relapse prevention was provided at the initial medical review. He was encouraged to continue to with tobacco cessation and was provided information on relapse prevention. Patient received information about combination therapy, tobacco cessation classes, quit line, and quit smoking apps in case of a relapse. Pt stated that he has had a few cravings but thinks back to his event and will not touch them.  Patient demonstrated understanding of this material.Staff will continue to provide encouragement and follow up with the patient throughout the program.       Expected Outcomes Continued cessation from tobacco products              Core Components/Risk Factors/Patient Goals at Discharge (Final Review):   Goals and Risk Factor Review - 08/24/20 1332      Core Components/Risk Factors/Patient Goals Review   Personal Goals Review Tobacco Cessation    Review Andrew Potts has recently quit tobacco use within the last 6 months. Intervention for relapse prevention was provided at the initial medical review. He was encouraged to continue to with tobacco cessation and was provided information on relapse prevention. Patient received information about combination therapy,  tobacco cessation classes, quit line, and quit smoking apps in case of a relapse. Pt stated that he has had a few cravings but thinks back to his event and will not touch them.  Patient demonstrated understanding of this material.Staff will continue to provide encouragement and follow up with the patient throughout the program.    Expected Outcomes Continued cessation from tobacco products           ITP Comments:  ITP Comments    Row Name 08/18/20 1004 08/24/20 1325         ITP Comments Virtual Visit completed. Patient informed on EP and RD appointment and 6 Minute walk test. Patient also informed of patient health questionnaires on My Chart. Patient Verbalizes understanding. Visit diagnosis can be found in [facility]1/13/2022. Completed and gym orientation. Initial ITP created and sent for review to Dr. Bethann Punches, Medical Director.             Comments: Initial ITP

## 2020-08-29 ENCOUNTER — Encounter: Payer: Medicare Other | Admitting: *Deleted

## 2020-08-29 ENCOUNTER — Other Ambulatory Visit: Payer: Self-pay

## 2020-08-29 DIAGNOSIS — I213 ST elevation (STEMI) myocardial infarction of unspecified site: Secondary | ICD-10-CM

## 2020-08-29 DIAGNOSIS — I252 Old myocardial infarction: Secondary | ICD-10-CM | POA: Diagnosis not present

## 2020-08-29 NOTE — Progress Notes (Signed)
Daily Session Note  Patient Details  Name: Andrew Potts MRN: [id number] Date of Birth: [birth year] Referring Provider:   Flowsheet Row Cardiac Rehab from 08/24/2020 in [facility] Card[facility] Rehab  Referring Provider Bartholome Bill MD      Encounter Date: 08/29/2020  Check In:  Session Check In - 08/29/20 1112      Check-In   Supervising physician immediately available to respond to emergencies See telemetry face sheet for immediately available ER MD    Location [facility]-Cardiac & Pulmonary Rehab    Staff Present Renita Papa, RN BSN;Joseph [address] [place], [place], ACSM CEP, Exercise Physiologist;Kara Eliezer Bottom, MS Exercise Physiologist    Virtual Visit No    Medication changes reported     No    Fall or balance concerns reported    No    Warm-up and Cool-down Performed on first and last piece of equipment    Resistance Training Performed Yes    VAD Patient? No    PAD/SET Patient? No      Pain Assessment   Currently in Pain? No/denies              Social History   Tobacco Use  Smoking Status Former Smoker  . Packs/day: 0.50  . Years: 47.00  . Pack years: 23.50  . Types: Cigarettes  . Quit date: 07/28/2020  . Years since quitting: 0.0  Smokeless Tobacco Never Used  Tobacco Comment   08/24/20 given quit smoking paperwork and encouraged continued cessation    Goals Met:  Independence with exercise equipment Exercise tolerated well No report of cardiac concerns or symptoms Strength training completed today  Goals Unmet:  Not Applicable  Comments: First full day of exercise!  Patient was oriented to gym and equipment including functions, settings, policies, and procedures.  Patient's individual exercise prescription and treatment plan were reviewed.  All starting workloads were established based on the results of the 6 minute walk test done at initial orientation visit.  The plan for exercise progression was also introduced and progression will be  customized based on patient's performance and goals.    Dr. Emily Filbert is Medical Director for [facility] and [facility].

## 2020-08-31 ENCOUNTER — Other Ambulatory Visit: Payer: Self-pay

## 2020-08-31 DIAGNOSIS — I252 Old myocardial infarction: Secondary | ICD-10-CM | POA: Diagnosis not present

## 2020-08-31 DIAGNOSIS — I213 ST elevation (STEMI) myocardial infarction of unspecified site: Secondary | ICD-10-CM

## 2020-08-31 NOTE — Progress Notes (Signed)
Daily Session Note  Patient Details  Name: Andrew Potts MRN: [id number] Date of Birth: 06-21-1951 Referring Provider:   Flowsheet Row Cardiac Rehab from 08/24/2020 in [facility] Cardiac and [facility] Rehab  Referring Provider Bartholome Bill MD      Encounter Date: 08/31/2020  Check In:  Session Check In - 08/31/20 1106      Check-In   Supervising physician immediately available to respond to emergencies See telemetry face sheet for immediately available ER MD    Location [facility]-Cardiac & Pulmonary Rehab    Staff Present Birdie Sons, MPA, Elveria Rising, BA, ACSM CEP, Exercise Physiologist;Joseph Alcus Dad, RN BSN    Virtual Visit No    Medication changes reported     No    Fall or balance concerns reported    No    Tobacco Cessation No Change    Current number of cigarettes/nicotine per day     0    Warm-up and Cool-down Performed on first and last piece of equipment    Resistance Training Performed Yes    VAD Patient? No    PAD/SET Patient? No      Pain Assessment   Currently in Pain? No/denies              Social History   Tobacco Use  Smoking Status Former Smoker  . Packs/day: 0.50  . Years: 47.00  . Pack years: 23.50  . Types: Cigarettes  . Quit date: 07/28/2020  . Years since quitting: 0.0  Smokeless Tobacco Never Used  Tobacco Comment   08/24/20 given quit smoking paperwork and encouraged continued cessation    Goals Met:  Independence with exercise equipment Exercise tolerated well No report of cardiac concerns or symptoms Strength training completed today  Goals Unmet:  Not Applicable  Comments: Pt able to follow exercise prescription today without complaint.  Will continue to monitor for progression.    Dr. Emily Filbert is Medical Director for [facility] and [facility].

## 2020-09-05 DIAGNOSIS — I2111 ST elevation (STEMI) myocardial infarction involving right coronary artery: Secondary | ICD-10-CM | POA: Diagnosis not present

## 2020-09-05 DIAGNOSIS — J449 Chronic obstructive pulmonary disease, unspecified: Secondary | ICD-10-CM | POA: Diagnosis not present

## 2020-09-05 DIAGNOSIS — E782 Mixed hyperlipidemia: Secondary | ICD-10-CM | POA: Diagnosis not present

## 2020-09-05 DIAGNOSIS — I2510 Atherosclerotic heart disease of native coronary artery without angina pectoris: Secondary | ICD-10-CM | POA: Diagnosis not present

## 2020-09-05 DIAGNOSIS — F17219 Nicotine dependence, cigarettes, with unspecified nicotine-induced disorders: Secondary | ICD-10-CM | POA: Diagnosis not present

## 2020-09-07 ENCOUNTER — Other Ambulatory Visit: Payer: Self-pay

## 2020-09-07 ENCOUNTER — Encounter: Payer: Self-pay | Admitting: *Deleted

## 2020-09-07 DIAGNOSIS — I252 Old myocardial infarction: Secondary | ICD-10-CM | POA: Diagnosis not present

## 2020-09-07 DIAGNOSIS — I213 ST elevation (STEMI) myocardial infarction of unspecified site: Secondary | ICD-10-CM

## 2020-09-07 NOTE — Progress Notes (Signed)
Completed initial RD evaluation 

## 2020-09-07 NOTE — Progress Notes (Signed)
Daily Session Note  Patient Details  Name: BRANNEN KOPPEN MRN: [id number] Date of Birth: [birth year] Referring Provider:   Flowsheet Row Cardiac Rehab from 08/24/2020 in [facility] Cardiac and Pulmonary Rehab  Referring Provider Bartholome Bill MD      Encounter Date: 09/07/2020  Check In:  Session Check In - 09/07/20 1025      Check-In   Supervising physician immediately available to respond to emergencies See telemetry face sheet for immediately available ER MD    Location [facility]-Cardiac & Pulmonary Rehab    Staff Present Birdie Sons, MPA, Elveria Rising, BA, ACSM CEP, Exercise Physiologist;Joseph Tessie Fass RCP,RRT,BSRT    Virtual Visit No    Medication changes reported     No    Fall or balance concerns reported    No    Tobacco Cessation No Change    Current number of cigarettes/nicotine per day     0    Warm-up and Cool-down Performed on first and last piece of equipment    Resistance Training Performed Yes    VAD Patient? No    PAD/SET Patient? No      Pain Assessment   Currently in Pain? No/denies              Social History   Tobacco Use  Smoking Status Former Smoker  . Packs/day: 0.50  . Years: 47.00  . Pack years: 23.50  . Types: Cigarettes  . Quit date: 07/28/2020  . Years since quitting: 0.1  Smokeless Tobacco Never Used  Tobacco Comment   08/24/20 given quit smoking paperwork and encouraged continued cessation    Goals Met:  Independence with exercise equipment Exercise tolerated well No report of cardiac concerns or symptoms Strength training completed today  Goals Unmet:  Not Applicable  Comments: Pt able to follow exercise prescription today without complaint.  Will continue to monitor for progression.    Dr. Emily Filbert is Medical Director for [facility].

## 2020-09-07 NOTE — Progress Notes (Signed)
Cardiac Individual Treatment Plan  Patient Details  Name: Andrew BaliJohnny L Potts MRN: [id number] Date of Birth: [birth year] Referring Provider:   Flowsheet Row Cardiac Rehab from 08/24/2020 in [facility] Cardiac [facility]  Referring Provider Harold HedgeFath, Kenneth MD      Initial Encounter Date:  Flowsheet Row [facility]  Date 08/24/20      Visit Diagnosis: ST elevation myocardial infarction (STEMI), unspecified artery (HCC)  Patient's Home Medications on Admission:  Current Outpatient Medications:  .  aspirin EC 81 MG EC tablet, Take 1 tablet (81 mg total) by mouth daily. Swallow whole., Disp: 30 tablet, Rfl: 11 .  atorvastatin (LIPITOR) 80 MG tablet, Take 1 tablet (80 mg total) by mouth daily., Disp: 90 tablet, Rfl: 1 .  clopidogrel (PLAVIX) 75 MG tablet, Take by mouth., Disp: , Rfl:  .  lisinopril (ZESTRIL) 5 MG tablet, Take 1 tablet (5 mg total) by mouth daily., Disp: 30 tablet, Rfl: 1 .  metoprolol tartrate (LOPRESSOR) 25 MG tablet, Take 0.5 tablets (12.5 mg total) by mouth 2 (two) times daily., Disp: 60 tablet, Rfl: 1 .  nitroGLYCERIN (NITROSTAT) 0.4 MG SL tablet, Place 1 tablet (0.4 mg total) under the tongue every 5 (five) minutes as needed for chest pain., Disp: 30 tablet, Rfl: 12 .  PROAIR HFA 108 (90 Base) MCG/ACT inhaler, Inhale 2 puffs into the lungs every 4 (four) hours as needed., Disp: 18 g, Rfl: 4 .  SYMBICORT 160-4.5 MCG/ACT inhaler, Inhale 2 puffs into the lungs 2 (two) times daily., Disp: 10.2 g, Rfl: 5  Past Medical History: Past Medical History:  Diagnosis Date  . Arthritis   . CHF (congestive heart failure) (HCC)   . COPD (chronic obstructive pulmonary disease) (HCC)   . Coronary artery disease    STEMI    Tobacco Use: Social History   Tobacco Use  Smoking Status Former Smoker  . Packs/day: 0.50  . Years: 47.00  . Pack years: 23.50  . Types: Cigarettes  . Quit date: 07/28/2020  . Years since quitting: 0.1   Smokeless Tobacco Never Used  Tobacco Comment   08/24/20 given quit smoking paperwork and encouraged continued cessation    Labs: Recent Review Flowsheet Data    Labs for ITP Cardiac and Pulmonary Rehab Latest Ref Rng & Units 09/16/2019 05/03/2020 07/28/2020 07/29/2020 08/10/2020   Cholestrol 100 - 199 mg/dL 409179 [id number] - [id number]   LDLCALC 0 - 99 mg/dL 956(O103(H) 130(Q122(H) - 657(Q106(H) 61   HDL >39 mg/dL 48 40 - 46(N40(L) 42   Trlycerides 0 - 149 mg/dL 629(B158(H) 284117 - 74 66   Hemoglobin A1c 4.8 - 5.6 % - - - 5.2 -   HCO3 20.0 - 28.0 mmol/L - - 24.8 - -   ACIDBASEDEF 0.0 - 2.0 mmol/L - - 1.7 - -   O2SAT % - - 82.0 - -       Exercise Target Goals: Exercise Program Goal: Individual exercise prescription set using results from initial 6 min walk test and THRR while considering  patient's activity barriers and safety.   Exercise Prescription Goal: Initial exercise prescription builds to 30-45 minutes a day of aerobic activity, 2-3 days per week.  Home exercise guidelines will be given to patient during program as part of exercise prescription that the participant will acknowledge.   Education: Aerobic Exercise: - Group verbal and visual presentation on the components of exercise prescription. Introduces F.I.T.T principle from ACSM for exercise prescriptions.  Reviews  F.I.T.T. principles of aerobic exercise including progression. Written material given at graduation. Flowsheet Row Cardiac Rehab from 08/31/2020 in [facility] Cardiac and Pulmonary Rehab  Education need identified 08/24/20  Date 08/31/20  Educator jh  Instruction Review Code 1- Verbalizes Understanding      Education: Resistance Exercise: - Group verbal and visual presentation on the components of exercise prescription. Introduces F.I.T.T principle from ACSM for exercise prescriptions  Reviews F.I.T.T. principles of resistance exercise including progression. Written material given at graduation.    Education: Exercise & Equipment Safety: -  Individual verbal instruction and demonstration of equipment use and safety with use of the equipment. Flowsheet Row Cardiac Rehab from 08/31/2020 in [facility] Cardiac and Pulmonary Rehab  Date 08/18/20  Educator [facility]  Instruction Review Code 1- Verbalizes Understanding      Education: Exercise Physiology & General Exercise Guidelines: - Group verbal and written instruction with models to review the exercise physiology of the cardiovascular system and associated critical values. Provides general exercise guidelines with specific guidelines to those with heart or lung disease.    Education: Flexibility, Balance, Mind/Body Relaxation: - Group verbal and visual presentation with interactive activity on the components of exercise prescription. Introduces F.I.T.T principle from ACSM for exercise prescriptions. Reviews F.I.T.T. principles of flexibility and balance exercise training including progression. Also discusses the mind body connection.  Reviews various relaxation techniques to help reduce and manage stress (i.e. Deep breathing, progressive muscle relaxation, and visualization). Balance handout provided to take home. Written material given at graduation.   Activity Barriers & Risk Stratification:  Activity Barriers & Cardiac Risk Stratification - 08/24/20 1326      Activity Barriers & Cardiac Risk Stratification   Activity Barriers Muscular Weakness;Deconditioning;Shortness of Breath;Joint Problems;Other (comment)    Comments chronic hip pain from working, residual chest soreness from CPR    Cardiac Risk Stratification High           6 Minute Walk:  6 Minute Walk    Row Name 08/24/20 1325         6 Minute Walk   Phase Initial     Distance 1395 feet     Walk Time 6 minutes     # of Rest Breaks 0     MPH 2.64     METS 3.94     RPE 9     Perceived Dyspnea  1     VO2 Peak 13.8     Symptoms Yes (comment)     Comments chronic hip pain 4/10, SOB     Resting HR 71 bpm     Resting BP  142/70     Exercise Oxygen Saturation  during 6 min walk 97 %     Max Ex. HR 114 bpm     Max Ex. BP 166/84     2 Minute Post BP 124/76            Oxygen Initial Assessment:   Oxygen Re-Evaluation:   Oxygen Discharge (Final Oxygen Re-Evaluation):   Initial Exercise Prescription:  Initial Exercise Prescription - 08/24/20 1300      Date of Initial Exercise RX and Referring Provider   Date 08/24/20    Referring Provider Harold Hedge MD      Treadmill   MPH 2.6    Grade 1    Minutes 15    METs 3.35      REL-XR   Level 2    Speed 50    Minutes 15    METs 3  T5 Nustep   Level 2    SPM 80    Minutes 15    METs 3      Prescription Details   Frequency (times per week) 2    Duration Progress to 30 minutes of continuous aerobic without signs/symptoms of physical distress      Intensity   THRR 40-80% of Max Heartrate 104-137    Ratings of Perceived Exertion 11-13    Perceived Dyspnea 0-4      Progression   Progression Continue to progress workloads to maintain intensity without signs/symptoms of physical distress.      Resistance Training   Training Prescription Yes    Weight 4 lb    Reps 10-15           Perform Capillary Blood Glucose checks as needed.  Exercise Prescription Changes:  Exercise Prescription Changes    Row Name 08/24/20 1300 08/30/20 1500           Response to Exercise   Blood Pressure (Admit) 142/70 126/66      Blood Pressure (Exercise) 166/84 160/76      Blood Pressure (Exit) 124/76 104/68      Heart Rate (Admit) 71 bpm 76 bpm      Heart Rate (Exercise) 114 bpm 126 bpm      Heart Rate (Exit) 82 bpm 103 bpm      Oxygen Saturation (Exercise) 97 % --      Rating of Perceived Exertion (Exercise) 9 14      Perceived Dyspnea (Exercise) 1 --      Symptoms SOB, chronic hip pain 4/10 none      Comments walk test results first full day of exercise      Duration -- Progress to 30 minutes of  aerobic without signs/symptoms of  physical distress      Intensity -- THRR unchanged             Progression   Progression -- Continue to progress workloads to maintain intensity without signs/symptoms of physical distress.      Average METs -- 3.23             Resistance Training   Training Prescription -- Yes      Weight -- 3 lb      Reps -- 10-15             Interval Training   Interval Training -- No             Treadmill   MPH -- 2.6      Grade -- 1      Minutes -- 15      METs -- 3.35             REL-XR   Level -- 3      Minutes -- 15      METs -- 3.1             Exercise Comments:   Exercise Goals and Review:  Exercise Goals    Row Name 08/24/20 1328             Exercise Goals   Increase Physical Activity Yes       Intervention Provide advice, education, support and counseling about physical activity/exercise needs.;Develop an individualized exercise prescription for aerobic and resistive training based on initial evaluation findings, risk stratification, comorbidities and participant's personal goals.       Expected Outcomes Short Term: Attend rehab on a regular basis to increase amount of  physical activity.;Long Term: Add in home exercise to make exercise part of routine and to increase amount of physical activity.;Long Term: Exercising regularly at least 3-5 days a week.       Increase Strength and Stamina Yes       Intervention Provide advice, education, support and counseling about physical activity/exercise needs.;Develop an individualized exercise prescription for aerobic and resistive training based on initial evaluation findings, risk stratification, comorbidities and participant's personal goals.       Expected Outcomes Short Term: Increase workloads from initial exercise prescription for resistance, speed, and METs.;Short Term: Perform resistance training exercises routinely during rehab and add in resistance training at home;Long Term: Improve cardiorespiratory fitness, muscular  endurance and strength as measured by increased METs and functional capacity ( )       Able to understand and use rate of perceived exertion (RPE) scale Yes       Intervention Provide education and explanation on how to use RPE scale       Expected Outcomes Short Term: Able to use RPE daily in rehab to express subjective intensity level;Long Term:  Able to use RPE to guide intensity level when exercising independently       Able to understand and use Dyspnea scale Yes       Intervention Provide education and explanation on how to use Dyspnea scale       Expected Outcomes Short Term: Able to use Dyspnea scale daily in rehab to express subjective sense of shortness of breath during exertion;Long Term: Able to use Dyspnea scale to guide intensity level when exercising independently       Knowledge and understanding of Target Heart Rate Range (THRR) Yes       Intervention Provide education and explanation of THRR including how the numbers were predicted and where they are located for reference       Expected Outcomes Short Term: Able to state/look up THRR;Short Term: Able to use daily as guideline for intensity in rehab;Long Term: Able to use THRR to govern intensity when exercising independently       Able to check pulse independently Yes       Intervention Provide education and demonstration on how to check pulse in carotid and radial arteries.;Review the importance of being able to check your own pulse for safety during independent exercise       Expected Outcomes Short Term: Able to explain why pulse checking is important during independent exercise;Long Term: Able to check pulse independently and accurately       Understanding of Exercise Prescription Yes       Intervention Provide education, explanation, and written materials on patient's individual exercise prescription       Expected Outcomes Short Term: Able to explain program exercise prescription;Long Term: Able to explain home exercise  prescription to exercise independently              Exercise Goals Re-Evaluation :  Exercise Goals Re-Evaluation    Row Name 08/29/20 1113             Exercise Goal Re-Evaluation   Exercise Goals Review Increase Physical Activity;Knowledge and understanding of Target Heart Rate Range (THRR);Able to understand and use rate of perceived exertion (RPE) scale;Understanding of Exercise Prescription;Increase Strength and Stamina;Able to check pulse independently       Comments Reviewed RPE and dyspnea scales, THR and program prescription with pt today.  Pt voiced understanding and was given a copy of goals to take home.  Expected Outcomes Short: Use RPE daily to regulate intensity. Long: Follow program prescription in THR.              Discharge Exercise Prescription (Final Exercise Prescription Changes):  Exercise Prescription Changes - 08/30/20 1500      Response to Exercise   Blood Pressure (Admit) 126/66    Blood Pressure (Exercise) 160/76    Blood Pressure (Exit) 104/68    Heart Rate (Admit) 76 bpm    Heart Rate (Exercise) 126 bpm    Heart Rate (Exit) 103 bpm    Rating of Perceived Exertion (Exercise) 14    Symptoms none    Comments first full day of exercise    Duration Progress to 30 minutes of  aerobic without signs/symptoms of physical distress    Intensity THRR unchanged      Progression   Progression Continue to progress workloads to maintain intensity without signs/symptoms of physical distress.    Average METs 3.23      Resistance Training   Training Prescription Yes    Weight 3 lb    Reps 10-15      Interval Training   Interval Training No      Treadmill   MPH 2.6    Grade 1    Minutes 15    METs 3.35      REL-XR   Level 3    Minutes 15    METs 3.1           Nutrition:  Target Goals: Understanding of nutrition guidelines, daily intake of sodium 1500mg , cholesterol 200mg , calories 30% from fat and 7% or less from saturated fats, daily  to have 5 or more servings of fruits and vegetables.  Education: All About Nutrition: -Group instruction provided by verbal, written material, interactive activities, discussions, models, and posters to present general guidelines for heart healthy nutrition including fat, fiber, MyPlate, the role of sodium in heart healthy nutrition, utilization of the nutrition label, and utilization of this knowledge for meal planning. Follow up email sent as well. Written material given at graduation. Flowsheet Row Cardiac Rehab from 08/31/2020 in [facility] Cardiac and Pulmonary Rehab  Education need identified 08/24/20      Biometrics:  Pre Biometrics - 08/24/20 1329      Pre Biometrics   Height 5' 8.1" (1.73 m)    Weight 152 lb 3.2 oz (69 kg)    BMI (Calculated) 23.07    Single Leg Stand 28.11 seconds            Nutrition Therapy Plan and Nutrition Goals:  Nutrition Therapy & Goals - 08/24/20 1329      Intervention Plan   Intervention Prescribe, educate and counsel regarding individualized specific dietary modifications aiming towards targeted core components such as weight, hypertension, lipid management, diabetes, heart failure and other comorbidities.    Expected Outcomes Short Term Goal: Understand basic principles of dietary content, such as calories, fat, sodium, cholesterol and nutrients.;Short Term Goal: A plan has been developed with personal nutrition goals set during dietitian appointment.;Long Term Goal: Adherence to prescribed nutrition plan.           Nutrition Assessments:  MEDIFICTS Score Key:  ?70 Need to make dietary changes   40-70 Heart Healthy Diet  ? 40 Therapeutic Level Cholesterol Diet  Flowsheet Row Cardiac Rehab from 08/24/2020 in [facility] Cardiac and Pulmonary Rehab  Picture Your Plate Total Score on Admission 57     Picture Your Plate Scores:  <16 Unhealthy dietary pattern with  much room for improvement.  41-50 Dietary pattern unlikely to meet recommendations  for good health and room for improvement.  51-60 More healthful dietary pattern, with some room for improvement.   >60 Healthy dietary pattern, although there may be some specific behaviors that could be improved.    Nutrition Goals Re-Evaluation:   Nutrition Goals Discharge (Final Nutrition Goals Re-Evaluation):   Psychosocial: Target Goals: Acknowledge presence or absence of significant depression and/or stress, maximize coping skills, provide positive support system. Participant is able to verbalize types and ability to use techniques and skills needed for reducing stress and depression.   Education: Stress, Anxiety, and Depression - Group verbal and visual presentation to define topics covered.  Reviews how body is impacted by stress, anxiety, and depression.  Also discusses healthy ways to reduce stress and to treat/manage anxiety and depression.  Written material given at graduation.   Education: Sleep Hygiene -Provides group verbal and written instruction about how sleep can affect your health.  Define sleep hygiene, discuss sleep cycles and impact of sleep habits. Review good sleep hygiene tips.    Initial Review & Psychosocial Screening:  Initial Psych Review & Screening - 08/18/20 1009      Initial Review   Current issues with None Identified      Family Dynamics   Good Support System? Yes    Comments He talks to his daughter everyday. His boss is a great support system, works at [employer] and has been for 3 years. He has a positive outlook on his health.      Barriers   Psychosocial barriers to participate in program There are no identifiable barriers or psychosocial needs.;The patient should benefit from training in stress management and relaxation.           Quality of Life Scores:   Quality of Life - 08/24/20 1329      Quality of Life   Select Quality of Life      Quality of Life Scores   Health/Function Pre 22.5 %    Socioeconomic Pre 22.5 %     Psych/Spiritual Pre 22.5 %    Family Pre 22.5 %    GLOBAL Pre 22.5 %          Scores of 19 and below usually indicate a poorer quality of life in these areas.  A difference of  2-3 points is a clinically meaningful difference.  A difference of 2-3 points in the total score of the Quality of Life Index has been associated with significant improvement in overall quality of life, self-image, physical symptoms, and general health in studies assessing change in quality of life.  PHQ-9: Recent Review Flowsheet Data    Depression screen [facility] 2/9 08/24/2020 08/10/2020 10/22/2019 03/10/2019 02/04/2019   Decreased Interest 0 0 0 0 0   Down, Depressed, Hopeless 0 0 0 0 0   PHQ - 2 Score 0 0 0 0 0   Altered sleeping 0 0 - 0 0   Tired, decreased energy 1 0 - 0 0   Change in appetite 0 0 - 0 0   Feeling bad or failure about yourself  0 0 - 0 0   Trouble concentrating 0 0 - 0 0   Moving slowly or fidgety/restless 0 0 - 0 0   Suicidal thoughts 0 0 - 0 0   PHQ-9 Score 1 0 - 0 0   Difficult doing work/chores Somewhat difficult - - Not difficult at all Not difficult at all  Interpretation of Total Score  Total Score Depression Severity:  1-4 = Minimal depression, 5-9 = Mild depression, 10-14 = Moderate depression, 15-19 = Moderately severe depression, 20-27 = Severe depression   Psychosocial Evaluation and Intervention:  Psychosocial Evaluation - 08/18/20 1014      Psychosocial Evaluation & Interventions   Interventions Encouraged to exercise with the program and follow exercise prescription;Relaxation education;Stress management education    Comments He talks to his daughter everyday. His boss is a great support system, works at [employer] and has been for 3 years. He has a positive outlook on his health.    Expected Outcomes Short: Exercise regularly to support mental health and notify staff of any changes. Long: maintain mental health and well being through teaching of rehab or prescribed medications  independently.    Continue Psychosocial Services  Follow up required by staff           Psychosocial Re-Evaluation:   Psychosocial Discharge (Final Psychosocial Re-Evaluation):   Vocational Rehabilitation: Provide vocational rehab assistance to qualifying candidates.   Vocational Rehab Evaluation & Intervention:   Education: Education Goals: Education classes will be provided on a variety of topics geared toward better understanding of heart health and risk factor modification. Participant will state understanding/return demonstration of topics presented as noted by education test scores.  Learning Barriers/Preferences:  Learning Barriers/Preferences - 08/18/20 1007      Learning Barriers/Preferences   Learning Barriers None    Learning Preferences None           General Cardiac Education Topics:  AED/CPR: - Group verbal and written instruction with the use of models to demonstrate the basic use of the AED with the basic ABC's of resuscitation.   Anatomy and Cardiac Procedures: - Group verbal and visual presentation and models provide information about basic cardiac anatomy and function. Reviews the testing methods done to diagnose heart disease and the outcomes of the test results. Describes the treatment choices: Medical Management, Angioplasty, or Coronary Bypass Surgery for treating various heart conditions including Myocardial Infarction, Angina, Valve Disease, and Cardiac Arrhythmias.  Written material given at graduation.   Medication Safety: - Group verbal and visual instruction to review commonly prescribed medications for heart and lung disease. Reviews the medication, class of the drug, and side effects. Includes the steps to properly store meds and maintain the prescription regimen.  Written material given at graduation.   Intimacy: - Group verbal instruction through game format to discuss how heart and lung disease can affect sexual intimacy. Written  material given at graduation.. Flowsheet Row Cardiac Rehab from 08/31/2020 in [facility] Cardiac and Pulmonary Rehab  Date 08/31/20  Educator jh  Instruction Review Code 1- Verbalizes Understanding      Know Your Numbers and Heart Failure: - Group verbal and visual instruction to discuss disease risk factors for cardiac and pulmonary disease and treatment options.  Reviews associated critical values for Overweight/Obesity, Hypertension, Cholesterol, and Diabetes.  Discusses basics of heart failure: signs/symptoms and treatments.  Introduces Heart Failure Zone chart for action plan for heart failure.  Written material given at graduation.   Infection Prevention: - Provides verbal and written material to individual with discussion of infection control including proper hand washing and proper equipment cleaning during exercise session. Flowsheet Row Cardiac Rehab from 08/31/2020 in [facility] Cardiac and Pulmonary Rehab  Date 08/18/20  Educator [facility]  Instruction Review Code 1- Verbalizes Understanding      Falls Prevention: - Provides verbal and written material to individual with discussion  of falls prevention and safety. Flowsheet Row Cardiac Rehab from 08/31/2020 in [facility] Cardiac and Pulmonary Rehab  Date 08/18/20  Educator [facility]  Instruction Review Code 1- Verbalizes Understanding      Other: -Provides group and verbal instruction on various topics (see comments)   Knowledge Questionnaire Score:  Knowledge Questionnaire Score - 08/24/20 1330      Knowledge Questionnaire Score   Pre Score 24/26 Education Focus: Exercise, nutrition           Core Components/Risk Factors/Patient Goals at Admission:  Personal Goals and Risk Factors at Admission - 08/24/20 1330      Core Components/Risk Factors/Patient Goals on Admission    Weight Management Yes;Weight Maintenance    Intervention Weight Management: Develop a combined nutrition and exercise program designed to reach desired caloric intake,  while maintaining appropriate intake of nutrient and fiber, sodium and fats, and appropriate energy expenditure required for the weight goal.;Weight Management: Provide education and appropriate resources to help participant work on and attain dietary goals.;Weight Management/Obesity: Establish reasonable short term and long term weight goals.    Admit Weight 152 lb 3.2 oz (69 kg)    Goal Weight: Short Term 152 lb (68.9 kg)    Goal Weight: Long Term 152 lb (68.9 kg)    Expected Outcomes Short Term: Continue to assess and modify interventions until short term weight is achieved;Long Term: Adherence to nutrition and physical activity/exercise program aimed toward attainment of established weight goal;Weight Maintenance: Understanding of the daily nutrition guidelines, which includes 25-35% calories from fat, 7% or less cal from saturated fats, less than 200mg  cholesterol, less than 1.5gm of sodium, & 5 or more servings of fruits and vegetables daily    Tobacco Cessation Yes   Quit 07/28/2020   Number of packs per day 0    Intervention Assist the participant in steps to quit. Provide individualized education and counseling about committing to Tobacco Cessation, relapse prevention, and pharmacological support that can be provided by physician.;07/30/2020, assist with locating and accessing local/national Quit Smoking programs, and support quit date choice.    Expected Outcomes Short Term: Will demonstrate readiness to quit, by selecting a quit date.;Short Term: Will quit all tobacco product use, adhering to prevention of relapse plan.;Long Term: Complete abstinence from all tobacco products for at least 12 months from quit date.    Lipids Yes    Intervention Provide education and support for participant on nutrition & aerobic/resistive exercise along with prescribed medications to achieve LDL 70mg , HDL >40mg .    Expected Outcomes Short Term: Participant states understanding of desired  cholesterol values and is compliant with medications prescribed. Participant is following exercise prescription and nutrition guidelines.;Long Term: Cholesterol controlled with medications as prescribed, with individualized exercise RX and with personalized nutrition plan. Value goals: LDL < 70mg , HDL > 40 mg.           Education:Diabetes - Individual verbal and written instruction to review signs/symptoms of diabetes, desired ranges of glucose level fasting, after meals and with exercise. Acknowledge that pre and post exercise glucose checks will be done for 3 sessions at entry of program.   Core Components/Risk Factors/Patient Goals Review:   Goals and Risk Factor Review    Row Name 08/24/20 1332             Core Components/Risk Factors/Patient Goals Review   Personal Goals Review Tobacco Cessation       Review Marcquis has recently quit tobacco use within the last 6 months.  Intervention for relapse prevention was provided at the initial medical review. He was encouraged to continue to with tobacco cessation and was provided information on relapse prevention. Patient received information about combination therapy, tobacco cessation classes, quit line, and quit smoking apps in case of a relapse. Pt stated that he has had a few cravings but thinks back to his event and will not touch them.  Patient demonstrated understanding of this material.Staff will continue to provide encouragement and follow up with the patient throughout the program.       Expected Outcomes Continued cessation from tobacco products              Core Components/Risk Factors/Patient Goals at Discharge (Final Review):   Goals and Risk Factor Review - 08/24/20 1332      Core Components/Risk Factors/Patient Goals Review   Personal Goals Review Tobacco Cessation    Review Sederick has recently quit tobacco use within the last 6 months. Intervention for relapse prevention was provided at the initial medical review. He was  encouraged to continue to with tobacco cessation and was provided information on relapse prevention. Patient received information about combination therapy, tobacco cessation classes, quit line, and quit smoking apps in case of a relapse. Pt stated that he has had a few cravings but thinks back to his event and will not touch them.  Patient demonstrated understanding of this material.Staff will continue to provide encouragement and follow up with the patient throughout the program.    Expected Outcomes Continued cessation from tobacco products           ITP Comments:  ITP Comments    Row Name 08/18/20 1004 08/24/20 1325 08/29/20 1113 09/07/20 0712     ITP Comments Virtual Visit completed. Patient informed on EP and RD appointment and 6 Minute walk test. Patient also informed of patient health questionnaires on My Chart. Patient Verbalizes understanding. Visit diagnosis can be found in [facility]1/13/2022. Completed and gym orientation. Initial ITP created and sent for review to Dr. Bethann Punches, Medical Director. First full day of exercise!  Patient was oriented to gym and equipment including functions, settings, policies, and procedures.  Patient's individual exercise prescription and treatment plan were reviewed.  All starting workloads were established based on the results of the 6 minute walk test done at initial orientation visit.  The plan for exercise progression was also introduced and progression will be customized based on patient's performance and goals. 30 Day review completed. Medical Director ITP review done, changes made as directed, and signed approval by Medical Director.  New to program           Comments:

## 2020-09-12 ENCOUNTER — Encounter: Payer: Medicare Other | Admitting: *Deleted

## 2020-09-12 ENCOUNTER — Other Ambulatory Visit: Payer: Self-pay

## 2020-09-12 DIAGNOSIS — I213 ST elevation (STEMI) myocardial infarction of unspecified site: Secondary | ICD-10-CM

## 2020-09-12 DIAGNOSIS — I252 Old myocardial infarction: Secondary | ICD-10-CM | POA: Diagnosis not present

## 2020-09-12 NOTE — Progress Notes (Signed)
Daily Session Note  Patient Details  Name: YUJI WALTH MRN: [id number] Date of Birth: 03/26/1951 Referring Provider:   Flowsheet Row Cardiac Rehab from 08/24/2020 in [facility] Cardiac [facility] Re[facility]  Referring Provider Bartholome Bill MD      Encounter Date: 09/12/2020  Check In:  Session Check In - 09/12/20 1059      Check-In   Supervising physician immediately available to respond to emergencies See telemetry face sheet for immediately available ER MD    Location [facility]-Cardiac & Pulmonary Rehab    Staff Present Renita Papa, RN BSN;Joseph [address] [place], [place], ACSM CEP, Exercise Physiologist;Kara Eliezer Bottom, MS Exercise Physiologist    Virtual Visit No    Medication changes reported     Yes    Comments Off plavix, on effient $RemoveBe'10mg'RRWABlHpP$     Fall or balance concerns reported    No    Tobacco Cessation No Change    Current number of cigarettes/nicotine per day     0    Warm-up and Cool-down Performed on first and last piece of equipment    Resistance Training Performed Yes    VAD Patient? No    PAD/SET Patient? No      Pain Assessment   Currently in Pain? No/denies              Social History   Tobacco Use  Smoking Status Former Smoker  . Packs/day: 0.50  . Years: 47.00  . Pack years: 23.50  . Types: Cigarettes  . Quit date: 07/28/2020  . Years since quitting: 0.1  Smokeless Tobacco Never Used  Tobacco Comment   08/24/20 given quit smoking paperwork and encouraged continued cessation    Goals Met:  Independence with exercise equipment Exercise tolerated well No report of cardiac concerns or symptoms Strength training completed today  Goals Unmet:  Not Applicable  Comments: Pt able to follow exercise prescription today without complaint.  Will continue to monitor for progression.    Dr. Emily Filbert is Medical Director for [place] [facility] and [facility].

## 2020-09-14 ENCOUNTER — Encounter: Payer: Medicare Other | Attending: Cardiology

## 2020-09-14 ENCOUNTER — Other Ambulatory Visit: Payer: Self-pay

## 2020-09-14 DIAGNOSIS — I213 ST elevation (STEMI) myocardial infarction of unspecified site: Secondary | ICD-10-CM | POA: Diagnosis not present

## 2020-09-14 NOTE — Progress Notes (Signed)
Daily Session Note  Patient Details  Name: Charlton L Graeff MRN: [id number] Date of Birth: 01/29/1951 Referring Provider:   Flowsheet Row Cardiac Rehab from 08/24/2020 in [facility] Cardiac and Pul[facility] Re[facility]  Referring Provider Fath, Kenneth MD      Encounter Date: 09/14/2020  Check In:  Session Check In - 09/14/20 1138      Check-In   Supervising physician immediately available to respond to emergencies See telemetry face sheet for immediately available ER MD    Location [facility]-Cardiac & Pulmonary Rehab    Staff Present Kelly Bollinger, MPA, RN;Joseph Hood RCP,RRT,BSRT;Jessica Hawkins, MA, RCEP, CCRP, CCET    Virtual Visit No    Medication changes reported     No    Fall or balance concerns reported    No    Tobacco Cessation No Change    Current number of cigarettes/nicotine per day     0    Warm-up and Cool-down Performed on first and last piece of equipment    Resistance Training Performed Yes    VAD Patient? No    PAD/SET Patient? No      Pain Assessment   Currently in Pain? No/denies              Social History   Tobacco Use  Smoking Status Former Smoker  . Packs/day: 0.50  . Years: 47.00  . Pack years: 23.50  . Types: Cigarettes  . Quit date: 07/28/2020  . Years since quitting: 0.1  Smokeless Tobacco Never Used  Tobacco Comment   08/24/20 given quit smoking paperwork and encouraged continued cessation    Goals Met:  Independence with exercise equipment Exercise tolerated well No report of cardiac concerns or symptoms Strength training completed today  Goals Unmet:  Not Applicable  Comments: Pt able to follow exercise prescription today without complaint.  Will continue to monitor for progression.    Dr. Mark Miller is Medical Director for [facility] and [facility]. 

## 2020-09-19 ENCOUNTER — Encounter: Payer: Medicare Other | Admitting: *Deleted

## 2020-09-19 ENCOUNTER — Other Ambulatory Visit: Payer: Self-pay

## 2020-09-19 DIAGNOSIS — K760 Fatty (change of) liver, not elsewhere classified: Secondary | ICD-10-CM | POA: Diagnosis not present

## 2020-09-19 DIAGNOSIS — I252 Old myocardial infarction: Secondary | ICD-10-CM | POA: Diagnosis not present

## 2020-09-19 DIAGNOSIS — R0902 Hypoxemia: Secondary | ICD-10-CM | POA: Diagnosis not present

## 2020-09-19 DIAGNOSIS — Z881 Allergy status to other antibiotic agents status: Secondary | ICD-10-CM | POA: Diagnosis not present

## 2020-09-19 DIAGNOSIS — Z87891 Personal history of nicotine dependence: Secondary | ICD-10-CM | POA: Diagnosis not present

## 2020-09-19 DIAGNOSIS — I213 ST elevation (STEMI) myocardial infarction of unspecified site: Secondary | ICD-10-CM | POA: Diagnosis not present

## 2020-09-19 DIAGNOSIS — I25110 Atherosclerotic heart disease of native coronary artery with unstable angina pectoris: Secondary | ICD-10-CM | POA: Diagnosis not present

## 2020-09-19 DIAGNOSIS — I110 Hypertensive heart disease with heart failure: Secondary | ICD-10-CM | POA: Diagnosis not present

## 2020-09-19 DIAGNOSIS — Z8500 Personal history of malignant neoplasm of unspecified digestive organ: Secondary | ICD-10-CM | POA: Diagnosis not present

## 2020-09-19 DIAGNOSIS — I2510 Atherosclerotic heart disease of native coronary artery without angina pectoris: Secondary | ICD-10-CM | POA: Diagnosis not present

## 2020-09-19 DIAGNOSIS — R0789 Other chest pain: Secondary | ICD-10-CM | POA: Diagnosis not present

## 2020-09-19 DIAGNOSIS — I708 Atherosclerosis of other arteries: Secondary | ICD-10-CM | POA: Diagnosis not present

## 2020-09-19 DIAGNOSIS — R1013 Epigastric pain: Secondary | ICD-10-CM | POA: Diagnosis not present

## 2020-09-19 DIAGNOSIS — E785 Hyperlipidemia, unspecified: Secondary | ICD-10-CM | POA: Diagnosis not present

## 2020-09-19 DIAGNOSIS — Z20822 Contact with and (suspected) exposure to covid-19: Secondary | ICD-10-CM | POA: Diagnosis not present

## 2020-09-19 DIAGNOSIS — M1990 Unspecified osteoarthritis, unspecified site: Secondary | ICD-10-CM | POA: Diagnosis not present

## 2020-09-19 DIAGNOSIS — K219 Gastro-esophageal reflux disease without esophagitis: Secondary | ICD-10-CM | POA: Diagnosis not present

## 2020-09-19 DIAGNOSIS — J449 Chronic obstructive pulmonary disease, unspecified: Secondary | ICD-10-CM | POA: Diagnosis not present

## 2020-09-19 DIAGNOSIS — Z955 Presence of coronary angioplasty implant and graft: Secondary | ICD-10-CM | POA: Diagnosis not present

## 2020-09-19 DIAGNOSIS — S2243XA Multiple fractures of ribs, bilateral, initial encounter for closed fracture: Secondary | ICD-10-CM | POA: Diagnosis not present

## 2020-09-19 DIAGNOSIS — Z79899 Other long term (current) drug therapy: Secondary | ICD-10-CM | POA: Diagnosis not present

## 2020-09-19 DIAGNOSIS — K810 Acute cholecystitis: Secondary | ICD-10-CM | POA: Diagnosis not present

## 2020-09-19 DIAGNOSIS — I255 Ischemic cardiomyopathy: Secondary | ICD-10-CM | POA: Diagnosis not present

## 2020-09-19 DIAGNOSIS — Z7902 Long term (current) use of antithrombotics/antiplatelets: Secondary | ICD-10-CM | POA: Diagnosis not present

## 2020-09-19 DIAGNOSIS — K5750 Diverticulosis of both small and large intestine without perforation or abscess without bleeding: Secondary | ICD-10-CM | POA: Diagnosis not present

## 2020-09-19 DIAGNOSIS — R079 Chest pain, unspecified: Secondary | ICD-10-CM | POA: Diagnosis not present

## 2020-09-19 DIAGNOSIS — I5022 Chronic systolic (congestive) heart failure: Secondary | ICD-10-CM | POA: Diagnosis not present

## 2020-09-19 DIAGNOSIS — Z7982 Long term (current) use of aspirin: Secondary | ICD-10-CM | POA: Diagnosis not present

## 2020-09-19 DIAGNOSIS — Z885 Allergy status to narcotic agent status: Secondary | ICD-10-CM | POA: Diagnosis not present

## 2020-09-19 DIAGNOSIS — Z743 Need for continuous supervision: Secondary | ICD-10-CM | POA: Diagnosis not present

## 2020-09-19 DIAGNOSIS — I208 Other forms of angina pectoris: Secondary | ICD-10-CM | POA: Diagnosis not present

## 2020-09-19 DIAGNOSIS — I200 Unstable angina: Secondary | ICD-10-CM | POA: Diagnosis not present

## 2020-09-19 DIAGNOSIS — S2242XA Multiple fractures of ribs, left side, initial encounter for closed fracture: Secondary | ICD-10-CM | POA: Diagnosis not present

## 2020-09-19 DIAGNOSIS — K3189 Other diseases of stomach and duodenum: Secondary | ICD-10-CM | POA: Diagnosis not present

## 2020-09-19 DIAGNOSIS — I700 Atherosclerosis of aorta: Secondary | ICD-10-CM | POA: Diagnosis not present

## 2020-09-19 DIAGNOSIS — K8062 Calculus of gallbladder and bile duct with acute cholecystitis without obstruction: Secondary | ICD-10-CM | POA: Diagnosis not present

## 2020-09-19 NOTE — Progress Notes (Signed)
Daily Session Note  Patient Details  Name: MILAM ALLBAUGH MRN: [id number] Date of Birth: 1951/01/01 Referring Provider:   Flowsheet Row Cardiac Rehab from 08/24/2020 in [facility] Cardiac and Pulmonary Rehab  Referring Provider Bartholome Bill MD      Encounter Date: 09/19/2020  Check In:  Session Check In - 09/19/20 1105      Check-In   Supervising physician immediately available to respond to emergencies See telemetry face sheet for immediately available ER MD    Location [facility]-Cardiac & Pulmonary Rehab    Staff Present Renita Papa, RN BSN;Joseph Lou [place], [place] Exercise Physiologist;Kelly Amedeo Plenty, [place], ACSM CEP, Exercise Physiologist    Virtual Visit No    Medication changes reported     No    Fall or balance concerns reported    No    Tobacco Cessation No Change    Current number of cigarettes/nicotine per day     0    Warm-up and Cool-down Performed on first and last piece of equipment    Resistance Training Performed Yes    VAD Patient? No    PAD/SET Patient? No      Pain Assessment   Currently in Pain? No/denies              Social History   Tobacco Use  Smoking Status Former Smoker  . Packs/day: 0.50  . Years: 47.00  . Pack years: 23.50  . Types: Cigarettes  . Quit date: 07/28/2020  . Years since quitting: 0.1  Smokeless Tobacco Never Used  Tobacco Comment   08/24/20 given quit smoking paperwork and encouraged continued cessation    Goals Met:  Independence with exercise equipment Exercise tolerated well No report of cardiac concerns or symptoms Strength training completed today  Goals Unmet:  Not Applicable  Comments: Pt able to follow exercise prescription today without complaint.  Will continue to monitor for progression.    Dr. Emily Filbert is Medical Director for [facility] and [facility].

## 2020-09-20 ENCOUNTER — Observation Stay: Payer: Medicare Other

## 2020-09-20 ENCOUNTER — Emergency Department: Payer: Medicare Other

## 2020-09-20 ENCOUNTER — Other Ambulatory Visit: Payer: Self-pay

## 2020-09-20 ENCOUNTER — Encounter: Payer: Self-pay | Admitting: Emergency Medicine

## 2020-09-20 ENCOUNTER — Inpatient Hospital Stay
Admission: EM | Admit: 2020-09-20 | Discharge: 2020-09-23 | DRG: 303 | Disposition: A | Discharge status: Home / Self Care | Payer: Medicare Other | Attending: Internal Medicine | Admitting: Internal Medicine

## 2020-09-20 DIAGNOSIS — Z79899 Other long term (current) drug therapy: Secondary | ICD-10-CM

## 2020-09-20 DIAGNOSIS — K8062 Calculus of gallbladder and bile duct with acute cholecystitis without obstruction: Secondary | ICD-10-CM | POA: Diagnosis present

## 2020-09-20 DIAGNOSIS — Z87891 Personal history of nicotine dependence: Secondary | ICD-10-CM

## 2020-09-20 DIAGNOSIS — I5022 Chronic systolic (congestive) heart failure: Secondary | ICD-10-CM | POA: Diagnosis present

## 2020-09-20 DIAGNOSIS — R079 Chest pain, unspecified: Secondary | ICD-10-CM

## 2020-09-20 DIAGNOSIS — I255 Ischemic cardiomyopathy: Secondary | ICD-10-CM | POA: Diagnosis present

## 2020-09-20 DIAGNOSIS — Z20822 Contact with and (suspected) exposure to covid-19: Secondary | ICD-10-CM | POA: Diagnosis present

## 2020-09-20 DIAGNOSIS — Z811 Family history of alcohol abuse and dependence: Secondary | ICD-10-CM

## 2020-09-20 DIAGNOSIS — K5710 Diverticulosis of small intestine without perforation or abscess without bleeding: Secondary | ICD-10-CM

## 2020-09-20 DIAGNOSIS — I252 Old myocardial infarction: Secondary | ICD-10-CM

## 2020-09-20 DIAGNOSIS — K819 Cholecystitis, unspecified: Secondary | ICD-10-CM

## 2020-09-20 DIAGNOSIS — Z881 Allergy status to other antibiotic agents status: Secondary | ICD-10-CM

## 2020-09-20 DIAGNOSIS — M1990 Unspecified osteoarthritis, unspecified site: Secondary | ICD-10-CM | POA: Diagnosis present

## 2020-09-20 DIAGNOSIS — R1013 Epigastric pain: Secondary | ICD-10-CM | POA: Diagnosis not present

## 2020-09-20 DIAGNOSIS — I200 Unstable angina: Secondary | ICD-10-CM | POA: Diagnosis not present

## 2020-09-20 DIAGNOSIS — R109 Unspecified abdominal pain: Secondary | ICD-10-CM

## 2020-09-20 DIAGNOSIS — I110 Hypertensive heart disease with heart failure: Secondary | ICD-10-CM | POA: Diagnosis present

## 2020-09-20 DIAGNOSIS — I2510 Atherosclerotic heart disease of native coronary artery without angina pectoris: Secondary | ICD-10-CM | POA: Diagnosis not present

## 2020-09-20 DIAGNOSIS — E785 Hyperlipidemia, unspecified: Secondary | ICD-10-CM | POA: Diagnosis present

## 2020-09-20 DIAGNOSIS — K5750 Diverticulosis of both small and large intestine without perforation or abscess without bleeding: Secondary | ICD-10-CM | POA: Diagnosis present

## 2020-09-20 DIAGNOSIS — Z8500 Personal history of malignant neoplasm of unspecified digestive organ: Secondary | ICD-10-CM

## 2020-09-20 DIAGNOSIS — J449 Chronic obstructive pulmonary disease, unspecified: Secondary | ICD-10-CM | POA: Diagnosis present

## 2020-09-20 DIAGNOSIS — Z7902 Long term (current) use of antithrombotics/antiplatelets: Secondary | ICD-10-CM

## 2020-09-20 DIAGNOSIS — I25110 Atherosclerotic heart disease of native coronary artery with unstable angina pectoris: Principal | ICD-10-CM | POA: Diagnosis present

## 2020-09-20 DIAGNOSIS — K5730 Diverticulosis of large intestine without perforation or abscess without bleeding: Secondary | ICD-10-CM

## 2020-09-20 DIAGNOSIS — Z885 Allergy status to narcotic agent status: Secondary | ICD-10-CM

## 2020-09-20 DIAGNOSIS — K219 Gastro-esophageal reflux disease without esophagitis: Secondary | ICD-10-CM | POA: Diagnosis present

## 2020-09-20 DIAGNOSIS — K760 Fatty (change of) liver, not elsewhere classified: Secondary | ICD-10-CM | POA: Diagnosis present

## 2020-09-20 DIAGNOSIS — Z7982 Long term (current) use of aspirin: Secondary | ICD-10-CM

## 2020-09-20 DIAGNOSIS — Z955 Presence of coronary angioplasty implant and graft: Secondary | ICD-10-CM

## 2020-09-20 DIAGNOSIS — I208 Other forms of angina pectoris: Secondary | ICD-10-CM | POA: Diagnosis present

## 2020-09-20 DIAGNOSIS — K810 Acute cholecystitis: Secondary | ICD-10-CM | POA: Diagnosis present

## 2020-09-20 HISTORY — DX: Diverticulosis of large intestine without perforation or abscess without bleeding: K57.30

## 2020-09-20 HISTORY — DX: Diverticulosis of small intestine without perforation or abscess without bleeding: K57.10

## 2020-09-20 LAB — HEPATIC FUNCTION PANEL
ALT: 54 U/L — ABNORMAL HIGH (ref 0–44)
AST: 38 U/L (ref 15–41)
Albumin: 3.8 g/dL (ref 3.5–5.0)
Alkaline Phosphatase: 74 U/L (ref 38–126)
Bilirubin, Direct: 0.2 mg/dL (ref 0.0–0.2)
Indirect Bilirubin: 0.8 mg/dL (ref 0.3–0.9)
Total Bilirubin: 1 mg/dL (ref 0.3–1.2)
Total Protein: 6.9 g/dL (ref 6.5–8.1)

## 2020-09-20 LAB — TROPONIN I (HIGH SENSITIVITY)
Troponin I (High Sensitivity): 22 ng/L — ABNORMAL HIGH (ref <18)
Troponin I (High Sensitivity): 26 ng/L — ABNORMAL HIGH (ref <18)
Troponin I (High Sensitivity): 31 ng/L — ABNORMAL HIGH (ref <18)
Troponin I (High Sensitivity): 35 ng/L — ABNORMAL HIGH (ref <18)

## 2020-09-20 LAB — HEPARIN LEVEL (UNFRACTIONATED)
Heparin Unfractionated: 0.37 IU/mL (ref 0.30–0.70)
Heparin Unfractionated: 0.48 IU/mL (ref 0.30–0.70)

## 2020-09-20 LAB — CBC
HCT: 40.3 % (ref 39.0–52.0)
Hemoglobin: 13.8 g/dL (ref 13.0–17.0)
MCH: 33.3 pg (ref 26.0–34.0)
MCHC: 34.2 g/dL (ref 30.0–36.0)
MCV: 97.1 fL (ref 80.0–100.0)
Platelets: 221 10*3/uL (ref 150–400)
RBC: 4.15 MIL/uL — ABNORMAL LOW (ref 4.22–5.81)
RDW: 12.6 % (ref 11.5–15.5)
WBC: 11 10*3/uL — ABNORMAL HIGH (ref 4.0–10.5)
nRBC: 0 % (ref 0.0–0.2)

## 2020-09-20 LAB — BASIC METABOLIC PANEL
Anion gap: 7 (ref 5–15)
BUN: 15 mg/dL (ref 8–23)
CO2: 27 mmol/L (ref 22–32)
Calcium: 9.5 mg/dL (ref 8.9–10.3)
Chloride: 108 mmol/L (ref 98–111)
Creatinine, Ser: 0.7 mg/dL (ref 0.61–1.24)
GFR, Estimated: >60 mL/min (ref >60)
Glucose, Bld: 150 mg/dL — ABNORMAL HIGH (ref 70–99)
Potassium: 4 mmol/L (ref 3.5–5.1)
Sodium: 142 mmol/L (ref 135–145)

## 2020-09-20 LAB — LIPASE, BLOOD: Lipase: 34 U/L (ref 11–51)

## 2020-09-20 LAB — PROTIME-INR
INR: 1.1 (ref 0.8–1.2)
Prothrombin Time: 13.8 seconds (ref 11.4–15.2)

## 2020-09-20 LAB — APTT: aPTT: 29 seconds (ref 24–36)

## 2020-09-20 LAB — SARS CORONAVIRUS 2 (TAT 6-24 HRS): SARS Coronavirus 2: NEGATIVE

## 2020-09-20 LAB — HIV ANTIBODY (ROUTINE TESTING W REFLEX): HIV Screen 4th Generation wRfx: NONREACTIVE

## 2020-09-20 MED ORDER — MOMETASONE FURO-FORMOTEROL FUM 200-5 MCG/ACT IN AERO
2.0000 | INHALATION_SPRAY | Freq: Two times a day (BID) | RESPIRATORY_TRACT | Status: DC
  Administered 2020-09-21 – 2020-09-23 (×4): 2 via RESPIRATORY_TRACT
  Filled 2020-09-20: qty 8.8

## 2020-09-20 MED ORDER — PRASUGREL HCL 10 MG PO TABS
10.0000 mg | ORAL_TABLET | Freq: Every day | ORAL | Status: DC
  Administered 2020-09-20 – 2020-09-23 (×3): 10 mg via ORAL
  Filled 2020-09-20 (×4): qty 1

## 2020-09-20 MED ORDER — HEPARIN BOLUS VIA INFUSION
4000.0000 [IU] | INTRAVENOUS | Status: AC
  Administered 2020-09-20: 4000 [IU] via INTRAVENOUS
  Filled 2020-09-20: qty 4000

## 2020-09-20 MED ORDER — ONDANSETRON HCL 4 MG/2ML IJ SOLN
4.0000 mg | Freq: Once | INTRAMUSCULAR | Status: AC
  Administered 2020-09-20: 4 mg via INTRAVENOUS
  Filled 2020-09-20: qty 2

## 2020-09-20 MED ORDER — PANTOPRAZOLE SODIUM 40 MG IV SOLR
40.0000 mg | Freq: Once | INTRAVENOUS | Status: AC
  Administered 2020-09-20: 40 mg via INTRAVENOUS
  Filled 2020-09-20: qty 40

## 2020-09-20 MED ORDER — HEPARIN (PORCINE) 25000 UT/250ML-% IV SOLN
950.0000 [IU]/h | INTRAVENOUS | Status: DC
  Administered 2020-09-20 – 2020-09-21 (×2): 950 [IU]/h via INTRAVENOUS
  Filled 2020-09-20 (×2): qty 250

## 2020-09-20 MED ORDER — ATORVASTATIN CALCIUM 80 MG PO TABS
80.0000 mg | ORAL_TABLET | Freq: Every evening | ORAL | Status: DC
  Administered 2020-09-20: 80 mg via ORAL
  Filled 2020-09-20: qty 4
  Filled 2020-09-20 (×2): qty 1

## 2020-09-20 MED ORDER — ASPIRIN 81 MG PO CHEW: 324.0000 mg | CHEWABLE_TABLET | ORAL | Status: DC

## 2020-09-20 MED ORDER — ONDANSETRON HCL 4 MG/2ML IJ SOLN: 4.0000 mg | Freq: Four times a day (QID) | INTRAMUSCULAR | Status: DC | PRN

## 2020-09-20 MED ORDER — ALUM & MAG HYDROXIDE-SIMETH 200-200-20 MG/5ML PO SUSP
30.0000 mL | Freq: Once | ORAL | Status: AC
  Administered 2020-09-20: 30 mL via ORAL
  Filled 2020-09-20: qty 30

## 2020-09-20 MED ORDER — NITROGLYCERIN 0.4 MG SL SUBL: 0.4000 mg | SUBLINGUAL_TABLET | SUBLINGUAL | Status: DC | PRN

## 2020-09-20 MED ORDER — METOPROLOL TARTRATE 25 MG PO TABS
12.5000 mg | ORAL_TABLET | Freq: Two times a day (BID) | ORAL | Status: DC
  Administered 2020-09-20 – 2020-09-23 (×6): 12.5 mg via ORAL
  Filled 2020-09-20 (×6): qty 1

## 2020-09-20 MED ORDER — ACETAMINOPHEN 325 MG PO TABS: 650.0000 mg | ORAL_TABLET | ORAL | Status: DC | PRN

## 2020-09-20 MED ORDER — ALBUTEROL SULFATE HFA 108 (90 BASE) MCG/ACT IN AERS
2.0000 | INHALATION_SPRAY | RESPIRATORY_TRACT | Status: DC | PRN
  Filled 2020-09-20: qty 6.7

## 2020-09-20 MED ORDER — LACTATED RINGERS IV BOLUS
1000.0000 mL | Freq: Once | INTRAVENOUS | Status: AC
  Administered 2020-09-20: 1000 mL via INTRAVENOUS

## 2020-09-20 MED ORDER — MORPHINE SULFATE (PF) 2 MG/ML IV SOLN
2.0000 mg | INTRAVENOUS | Status: DC | PRN
  Administered 2020-09-20 – 2020-09-22 (×2): 2 mg via INTRAVENOUS
  Filled 2020-09-20 (×2): qty 1

## 2020-09-20 MED ORDER — IOHEXOL 350 MG/ML SOLN
100.0000 mL | Freq: Once | INTRAVENOUS | Status: AC | PRN
  Administered 2020-09-20: 100 mL via INTRAVENOUS

## 2020-09-20 MED ORDER — CLOPIDOGREL BISULFATE 75 MG PO TABS: 75.0000 mg | ORAL_TABLET | Freq: Every day | ORAL | Status: DC

## 2020-09-20 MED ORDER — NITROGLYCERIN 2 % TD OINT
1.0000 [in_us] | TOPICAL_OINTMENT | Freq: Once | TRANSDERMAL | Status: AC
  Administered 2020-09-20: 1 [in_us] via TOPICAL
  Filled 2020-09-20: qty 1

## 2020-09-20 MED ORDER — ASPIRIN EC 81 MG PO TBEC
81.0000 mg | DELAYED_RELEASE_TABLET | Freq: Every day | ORAL | Status: DC
  Administered 2020-09-20: 81 mg via ORAL
  Filled 2020-09-20 (×3): qty 1

## 2020-09-20 MED ORDER — LISINOPRIL 5 MG PO TABS
5.0000 mg | ORAL_TABLET | Freq: Every day | ORAL | Status: DC
Start: 2020-09-20 — End: 2020-09-23
  Administered 2020-09-20 – 2020-09-23 (×3): 5 mg via ORAL
  Filled 2020-09-20 (×4): qty 1

## 2020-09-20 MED ORDER — MORPHINE SULFATE (PF) 2 MG/ML IV SOLN
2.0000 mg | Freq: Once | INTRAVENOUS | Status: AC
  Administered 2020-09-20: 2 mg via INTRAVENOUS
  Filled 2020-09-20: qty 1

## 2020-09-20 MED ORDER — ASPIRIN 300 MG RE SUPP: 300.0000 mg | RECTAL | Status: DC

## 2020-09-20 MED ORDER — MORPHINE SULFATE (PF) 4 MG/ML IV SOLN
4.0000 mg | Freq: Once | INTRAVENOUS | Status: AC
Start: 2020-09-20 — End: 2020-09-20
  Administered 2020-09-20: 4 mg via INTRAVENOUS
  Filled 2020-09-20: qty 1

## 2020-09-20 NOTE — ED Notes (Signed)
Resumed care from russell rn pt alert. Pt waitiing on admission bed.  nsr on monitor.

## 2020-09-20 NOTE — ED Notes (Signed)
Pt accidentally pulled IV

## 2020-09-20 NOTE — ED Provider Notes (Signed)
----------------------------------------- °  6:59 AM on 09/20/2020 -----------------------------------------  Blood pressure (!) 169/84, pulse 71, temperature 98.1 F (36.7 C), temperature source Oral, resp. rate 17, height 5\' 7"  (1.702 m), weight 72.1 kg, SpO2 93 %.  Assuming care from Dr. .  In short, Andrew Potts is a 70 y.o. male with a chief complaint of Chest Pain .  Refer to the original H&P for additional details.  The current plan of care is to follow-up CTA chest for chest pain, plan for admission.  ----------------------------------------- 8:39 AM on 09/20/2020 -----------------------------------------  CTA chest is negative for dissection, does show questionable gallbladder inflammation but on my assessment patient has no tenderness whatsoever in his right upper quadrant.  He does continue to complain of some pain right in the center of his chest, states this was partially alleviated by dose of morphine earlier.  We will give additional 2 mg IV morphine in case discussed with hospitalist for admission given concern for unstable angina.    11/20/2020, MD 09/20/20 [phone]

## 2020-09-20 NOTE — H&P (Signed)
History and Physical    Andrew Potts SFK:[id number] DOB: 12/02/1950 DOA: 09/20/2020  PCP: [name], NP   Patient coming from: Home  I have personally briefly reviewed patient's old medical records in [facility]  Chief Complaint: Chest pain  HPI: Andrew Potts is a 70 y.o. male with medical history significant for multivessel coronary artery disease status post recent PCI with stent angioplasty to the RCA following a non-ST elevation MI, ischemic cardiomyopathy with last known LVEF of 40 to 45%, COPD who presents to the emergency room via EMS for evaluation of sudden onset central chest pain mostly in the lower sternal area.  Patient states that his symptoms started about 9 PM after dinner which consisted of a cheeseburger.  He described the pain as severe and rated it an 8 x 10 in intensity at its worst, it was nonradiating and was associated with nausea and dry heaves.  He took a sublingual nitroglycerin with complete resolution of the pain initially but then it recurred.  He took 2 more sublingual nitroglycerin at home with transient relief.  Due to recurrence of his pain he called EMS who administered Zofran 4 mg, aspirin 324 mg as well as more nitroglycerin tablets.  He has also felt dizzy and lightheaded.  He continues to have centrally located lower sternal pain with relief following administration of morphine IV. He denies having any palpitations, no diaphoresis, no shortness of breath, no cough, no fever, no chills, no headache, no blurred vision, no urinary frequency, no nocturia, no dysuria, no sore throat, no focal deficits. Labs show sodium 142, potassium 4.0, chloride 108, bicarb 25, glucose 150, BUN 15, creatinine 0.70, calcium 9.5, alkaline phosphatase 74, albumin, lipase 34, AST 38, ALT 54, total protein 6.9 direct bilirubin 0.2, direct bilirubin 0.8, total bilirubin 1.0, troponin 22 >> 26, white count 11.0, hemoglobin 13.8, hematocrit 40.3, MCV 97.1, RDW 12.6,  platelet count 221, PT 13.8, INR 1.1 CT angiogram of chest, abdomen and pelvis is negative for aortic dissection or aneurysm. Extensive AorticAtherosclerosis  Questionable gallbladder wall inflammation. Consider follow-up Right Upper Quadrant Ultrasound in this clinical setting.Otherwise no acute or inflammatory process identified in the chest, abdomen, or pelvis. Extensive bilateral rib fractures, some of which remain incompletely healed.Heterogeneous hepatic steatosis suspected, along with small benign cyst or hemangioma in the right lobe of the liver. Multifocal diverticulosis of the colon without active inflammation. Duodenal diverticulum. Chest x-ray reviewed by me shows no active cardiopulmonary disease. Twelve-lead EKG reviewed by me shows sinus rhythm with T wave abnormality in inferior leads.   ED Course: Patient is a 70 year old Caucasian male who presents to the emergency room by EMS for evaluation of chest pain mostly in the center of his chest and the lower sternal area associated with nausea and dry heaves.  Onset of the pain was at rest and was after dinner, patient had a cheeseburger.  He initially had relief of his pain with sublingual nitroglycerin but said it subsequently recurred despite multiple tablets of sublingual nitroglycerin.  Patient's EKG does not show any acute findings and his troponin is flat.  He had a CT angiogram of the chest/abdomen and pelvis which shows ??  Gallbladder inflammation.  Patient was started on a heparin drip due to concerns of unstable angina as his chest pain has persisted.  He will be referred to observation status for further evaluation.    Review of Systems: As per HPI otherwise all other systems reviewed and negative.  Past Medical History:  Diagnosis Date  . Arthritis   . CHF (congestive heart failure) (HCC)   . COPD (chronic obstructive pulmonary disease) (HCC)   . Coronary artery disease    STEMI    Past Surgical History:   Procedure Laterality Date  . ABDOMINAL EXPLORATION SURGERY    . CORONARY/GRAFT ACUTE MI REVASCULARIZATION N/A 07/28/2020   Procedure: CORONARY/GRAFT ACUTE MI REVASCULARIZATION;  Surgeon: Alwyn Peaallwood, Dwayne D, MD;  Location: [facility] INVASIVE CV LAB;  Service: Cardiovascular;  Laterality: N/A;  . LEFT HEART CATH AND CORONARY ANGIOGRAPHY N/A 07/28/2020   Procedure: LEFT HEART CATH AND CORONARY ANGIOGRAPHY;  Surgeon: Alwyn Peaallwood, Dwayne D, MD;  Location: [facility] INVASIVE CV LAB;  Service: Cardiovascular;  Laterality: N/A;     reports that he quit smoking about 7 weeks ago. His smoking use included cigarettes. He has a 23.50 pack-year smoking history. He has never used smokeless tobacco. He reports previous alcohol use. He reports that he does not use drugs.  Allergies  Allergen Reactions  . Codeine   . Codone [Hydrocodone] Other (See Comments)    Abdominal pain  . Levaquin [Levofloxacin] Swelling    Family History  Problem Relation Age of Onset  . Alcohol abuse Father   . Alcohol abuse Brother   . Aneurysm Brother       Prior to Admission medications   Medication Sig Start Date End Date Taking? Authorizing Provider  aspirin EC 81 MG EC tablet Take 1 tablet (81 mg total) by mouth daily. Swallow whole. 07/31/20   Cipriano BunkerKumar, Pardeep, MD  atorvastatin (LIPITOR) 80 MG tablet Take 1 tablet (80 mg total) by mouth daily. 07/31/20   Cipriano BunkerKumar, Pardeep, MD  clopidogrel (PLAVIX) 75 MG tablet Take by mouth. 08/08/20 08/08/21  [provider]  lisinopril (ZESTRIL) 5 MG tablet Take 1 tablet (5 mg total) by mouth daily. 07/31/20   Cipriano BunkerKumar, Pardeep, MD  metoprolol tartrate (LOPRESSOR) 25 MG tablet Take 0.5 tablets (12.5 mg total) by mouth 2 (two) times daily. 07/30/20   Cipriano BunkerKumar, Pardeep, MD  nitroGLYCERIN (NITROSTAT) 0.4 MG SL tablet Place 1 tablet (0.4 mg total) under the tongue every 5 (five) minutes as needed for chest pain. 07/30/20   Cipriano BunkerKumar, Pardeep, MD  PROAIR HFA 108 [phone](90 Base) MCG/ACT inhaler Inhale 2 puffs into the  lungs every 4 (four) hours as needed. 06/24/20   [name], Corrie DandyJolene T, NP  SYMBICORT 160-4.5 MCG/ACT inhaler Inhale 2 puffs into the lungs 2 (two) times daily. 04/28/20   Aura Dialsannady, Jolene T, NP    Physical Exam: Vitals:   09/20/20 0400 09/20/20 0430 09/20/20 0500 09/20/20 0630  BP: (!) 163/85 140/73 (!) 146/94 (!) 169/84  Pulse: 64 67 72 71  Resp: (!) 25 17 11 17   Temp:      TempSrc:      SpO2: 99% 98% 99% 93%  Weight:      Height:         Vitals:   09/20/20 0400 09/20/20 0430 09/20/20 0500 09/20/20 0630  BP: (!) 163/85 140/73 (!) 146/94 (!) 169/84  Pulse: 64 67 72 71  Resp: (!) 25 17 11 17   Temp:      TempSrc:      SpO2: 99% 98% 99% 93%  Weight:      Height:          Constitutional: Alert and oriented x 3 .  Appears uncomfortable HEENT:      Head: Normocephalic and atraumatic.         Eyes: PERLA, EOMI,  Conjunctivae are normal. Sclera is non-icteric.       Mouth/Throat: Mucous membranes are moist.       Neck: Supple with no signs of meningismus. Cardiovascular: Regular rate and rhythm. No murmurs, gallops, or rubs. 2+ symmetrical distal pulses are present . No JVD. No LE edema Respiratory: Respiratory effort normal .Lungs sounds clear bilaterally. No wheezes, crackles, or rhonchi.  Gastrointestinal: Soft, epigastric tenderness, and non distended with positive bowel sounds.  Genitourinary: No CVA tenderness. Musculoskeletal: Nontender with normal range of motion in all extremities. No cyanosis, or erythema of extremities. Neurologic:  Face is symmetric. Moving all extremities. No gross focal neurologic deficits . Skin: Skin is warm, dry.  No rash or ulcers Psychiatric: Mood and affect are normal   Labs on Admission: I have personally reviewed following labs and imaging studies  CBC: Recent Labs  Lab 09/20/20 0209  WBC 11.0*  HGB 13.8  HCT 40.3  MCV 97.1  PLT 221   Basic Metabolic Panel: Recent Labs  Lab 09/20/20 0209  NA 142  K 4.0  CL 108  CO2 27   GLUCOSE 150*  BUN 15  CREATININE 0.70  CALCIUM 9.5   GFR: Estimated Creatinine Clearance: 83.8 mL/min (by C-G formula based on SCr of 0.7 mg/dL). Liver Function Tests: Recent Labs  Lab 09/20/20 0209  AST 38  ALT 54*  ALKPHOS 74  BILITOT 1.0  PROT 6.9  ALBUMIN 3.8   Recent Labs  Lab 09/20/20 0209  LIPASE 34   No results for input(s): AMMONIA in the last 168 hours. Coagulation Profile: Recent Labs  Lab 09/20/20 0209  INR 1.1   Cardiac Enzymes: No results for input(s): CKTOTAL, CKMB, CKMBINDEX, TROPONINI in the last 168 hours. BNP (last 3 results) No results for input(s): PROBNP in the last 8760 hours. HbA1C: No results for input(s): HGBA1C in the last 72 hours. CBG: No results for input(s): GLUCAP in the last 168 hours. Lipid Profile: No results for input(s): CHOL, HDL, LDLCALC, TRIG, CHOLHDL, LDLDIRECT in the last 72 hours. Thyroid Function Tests: No results for input(s): TSH, T4TOTAL, FREET4, T3FREE, THYROIDAB in the last 72 hours. Anemia Panel: No results for input(s): VITAMINB12, FOLATE, FERRITIN, TIBC, IRON, RETICCTPCT in the last 72 hours. Urine analysis: No results found for: COLORURINE, APPEARANCEUR, LABSPEC, PHURINE, GLUCOSEU, HGBUR, BILIRUBINUR, KETONESUR, PROTEINUR, UROBILINOGEN, NITRITE, LEUKOCYTESUR  Radiological Exams on Admission: DG Chest Port 1 View  Result Date: 09/20/2020 CLINICAL DATA:  Chest pain EXAM: PORTABLE CHEST 1 VIEW COMPARISON:  07/28/2020 FINDINGS: The heart size and mediastinal contours are within normal limits. Both lungs are clear. The visualized skeletal structures are unremarkable. IMPRESSION: No active disease. Electronically Signed   By: Deatra Robinson M.D.   On: 09/20/2020 02:39   CT Angio Chest/Abd/Pel for Dissection W and/or Wo Contrast  Result Date: 09/20/2020 CLINICAL DATA:  70 year old male status post STEMI in January. Epigastric pain. EXAM: CT ANGIOGRAPHY CHEST, ABDOMEN AND PELVIS TECHNIQUE: Noncontrast CT images of the  chest provided, followed by Multidetector CT imaging through the chest, abdomen and pelvis during bolus administration of intravenous contrast. Multiplanar reconstructed images and MIPs were obtained and reviewed to evaluate the vascular anatomy. CONTRAST:  OMNIPAQUE IOHEXOL 350 MG/ML SOLN COMPARISON:  Portable chest x-ray earlier today. Report of chest CT 11/12/2002 (no images available). FINDINGS: CTA CHEST FINDINGS Cardiovascular: Calcified coronary artery atherosclerosis (series 16, image 35). No cardiomegaly or pericardial effusion. Thoracic aorta is negative aside from atherosclerosis. Mediastinum/Nodes: Negative.  No lymphadenopathy. Lungs/Pleura: Major airways are patent. Minor  chronic scarring in the lungs, greater on the left and with some association to chronic rib fractures on that side. No pleural effusion or other abnormal pulmonary opacity. Musculoskeletal: Numerous left anterolateral rib deformities, present in January but some incompletely united (series 10, image 65). Similar anterior right rib fractures (3rd through 6th, such as series 10, image 75). No acute osseous abnormality identified. Review of the MIP images confirms the above findings. CTA ABDOMEN AND PELVIS FINDINGS VASCULAR Extensive Aortoiliac calcified atherosclerosis. Negative for dissection or aneurysm. Major arterial structures remain patent despite the atherosclerosis. Review of the MIP images confirms the above findings. NON-VASCULAR Hepatobiliary: Small round circumscribed low-density area in the right lobe measures 13 mm and is most likely a benign cyst or hemangioma. Heterogeneous hepatic steatosis suspected in the background liver, with some sparing including at the gallbladder fossa. No suspicious liver lesion. The gallbladder wall does appear mildly indistinct on coronal image 55, but there is no convincing pericholecystic inflammation. No bile duct enlargement. Pancreas: Negative. Spleen: Negative. Adrenals/Urinary  Tract: Negative adrenal glands. Contrast is being excreted from both kidneys and ureters which appear normal. Some contrast also in the urinary bladder which is unremarkable (ureteral jets). Stomach/Bowel: No dilated large or small bowel. Extensive diverticulosis in the descending colon, sigmoid colon, and to a lesser extent at the hepatic flexure. Negative appendix on series 9, image 178. No large bowel inflammation. Gastric fundus somewhat distended with food but the distal stomach is decompressed. 3 cm midline duodenum diverticulum on series 9, image 137 with no active inflammation. No free air, free fluid, or mesenteric stranding identified. Lymphatic: No lymphadenopathy. Reproductive: Negative. Other: No pelvic free fluid. Musculoskeletal: No acute osseous abnormality identified. Review of the MIP images confirms the above findings. IMPRESSION: 1. Negative for aortic dissection or aneurysm. Extensive Aortic Atherosclerosis (ICD10-I70.0). 2. Questionable gallbladder wall inflammation. Consider follow-up Right Upper Quadrant Ultrasound in this clinical setting. 3. Otherwise no acute or inflammatory process identified in the chest, abdomen, or pelvis. Extensive bilateral rib fractures, some of which remain incompletely healed. Heterogeneous hepatic steatosis suspected, along with small benign cyst or hemangioma in the right lobe of the liver. Multifocal diverticulosis of the colon without active inflammation. Duodenal diverticulum. Electronically Signed   By: Odessa Fleming M.D.   On: 09/20/2020 07:33     Assessment/Plan Principal Problem:   Unstable angina (HCC) Active Problems:   COPD (chronic obstructive pulmonary disease) (HCC)   CAD, multiple vessel   Ischemic cardiomyopathy     Unstable angina In a patient with known multivessel coronary artery disease who presents for evaluation of centrally located chest pain, nonradiating associated with nausea and dry heaves with transient resolution following  administration of nitroglycerin. Patient's troponin is flat and he has no acute EKG changes Continue heparin drip Continue aspirin, Plavix, statins and metoprolol IV morphine and sublingual nitroglycerin for pain control We will request cardiology consult    Ischemic cardiomyopathy Last known LVEF of 40 to 45% from a 2D echocardiogram done on 07/28/20 Continue metoprolol and lisinopril   COPD Not acutely exacerbated Continue as needed bronchodilator therapy as well as inhaled steroids    ??  Gallbladder disease Patient presents for evaluation of chest pain which is centrally located in involving the lower sternum associated with nausea and dry heaves. Onset of pain was sudden and following a meal, patient had a cheeseburger for dinner CT angiogram of the chest and abdomen showed questionable inflammation of the gallbladder We will obtain gallbladder ultrasound for further evaluation  DVT prophylaxis: Heparin Code Status: full code Family Communication: Greater than 50% of time was spent discussing patient's condition and plan of care with him at the bedside.  All questions and concerns have been addressed.  He verbalizes understanding and agrees with the plan. Disposition Plan: Back to previous home environment Consults called: Cardiology Status: Observation    Hernando Reali MD [facility]     09/20/2020, 9:42 AM

## 2020-09-20 NOTE — ED Notes (Signed)
Ultrasound at bedside

## 2020-09-20 NOTE — Progress Notes (Signed)
ANTICOAGULATION CONSULT NOTE  Pharmacy Consult for Heparin Infusion Indication: ACS/STEMI (unstable angina).  Allergies  Allergen Reactions  . Codeine   . Codone [Hydrocodone] Other (See Comments)    Abdominal pain  . Levaquin [Levofloxacin] Swelling    Patient Measurements: Height: 5\' 7"  (170.2 cm) Weight: 72.1 kg (159 lb) IBW/kg (Calculated) : 66.1 Heparin Dosing Weight: 72.1   Vital Signs: BP: 141/81 (03/08 1500) Pulse Rate: 66 (03/08 1530)  Labs: Recent Labs    09/20/20 0209 09/20/20 0400 09/20/20 0924 09/20/20 1638  HGB 13.8  --   --   --   HCT 40.3  --   --   --   PLT 221  --   --   --   APTT 29  --   --   --   LABPROT 13.8  --   --   --   INR 1.1  --   --   --   HEPARINUNFRC  --   --   --  0.37  CREATININE 0.70  --   --   --   TROPONINIHS 22* 26* 31* 35*    Estimated Creatinine Clearance: 83.8 mL/min (by C-G formula based on SCr of 0.7 mg/dL).   Medical History: Past Medical History:  Diagnosis Date  . Arthritis   . CHF (congestive heart failure) (HCC)   . COPD (chronic obstructive pulmonary disease) (HCC)   . Coronary artery disease    STEMI     Assessment: Pt is 70 yo male c/o sudden onset of central CP.  Pt took NTG x 3 w/ some relief.  Pt had MI Jan 2022 w/ cardiac arrest.  Goal of Therapy:  Heparin level 0.3-0.7 units/ml Monitor platelets by anticoagulation protocol: Yes   Plan:  3/8 1638 HL 0.37, therapeutic x 1. Continue heparin drip at 950 units/hr. Recheck HL at 2300 to confirm. CBC daily while on heparin drip.  5/8, PharmD Clinical Pharmacist 09/20/2020 5:15 PM

## 2020-09-20 NOTE — ED Triage Notes (Signed)
Pt arrived via EMS from home with c/o sudden onset of central chest pain, starting at 2100. Pt took 3 sublingual Nitro, EMS provided 3 more with pt reporting relief. Pt also received 4mg  Zofran, 324mg  Aspirin in route.   Previous MI in January with cardiac arrest.

## 2020-09-20 NOTE — ED Notes (Signed)
Meds due have not been verified

## 2020-09-20 NOTE — Progress Notes (Signed)
ANTICOAGULATION CONSULT NOTE  Pharmacy Consult for Heparin Infusion Indication: ACS/STEMI (unstable angina).  Allergies  Allergen Reactions  . Codeine   . Codone [Hydrocodone] Other (See Comments)    Abdominal pain  . Levaquin [Levofloxacin] Swelling    Patient Measurements: Height: 5\' 7"  (170.2 cm) Weight: 72.1 kg (159 lb) IBW/kg (Calculated) : 66.1 Heparin Dosing Weight: 72.1   Vital Signs: Temp: 98 F (36.7 C) (03/08 2340) Temp Source: Oral (03/08 2111) BP: 128/83 (03/08 2340) Pulse Rate: 60 (03/08 2340)  Labs: Recent Labs    09/20/20 0209 09/20/20 0400 09/20/20 0924 09/20/20 1638 09/20/20 2258  HGB 13.8  --   --   --   --   HCT 40.3  --   --   --   --   PLT 221  --   --   --   --   APTT 29  --   --   --   --   LABPROT 13.8  --   --   --   --   INR 1.1  --   --   --   --   HEPARINUNFRC  --   --   --  0.37 0.48  CREATININE 0.70  --   --   --   --   TROPONINIHS 22* 26* 31* 35*  --     Estimated Creatinine Clearance: 83.8 mL/min (by C-G formula based on SCr of 0.7 mg/dL).   Medical History: Past Medical History:  Diagnosis Date  . Arthritis   . CHF (congestive heart failure) (HCC)   . COPD (chronic obstructive pulmonary disease) (HCC)   . Coronary artery disease    STEMI     Assessment: Pt is 70 yo male c/o sudden onset of central CP.  Pt took NTG x 3 w/ some relief.  Pt had MI Jan 2022 w/ cardiac arrest.  Goal of Therapy:  Heparin level 0.3-0.7 units/ml Monitor platelets by anticoagulation protocol: Yes   Plan:  3/8 1638 HL 0.37, therapeutic x 1 3/8 2258 HL 0.48, therapeutic x 2 Continue heparin drip at 950 units/hr. Recheck HL daily with AM  labs. CBC daily while on heparin drip.  5/8, PharmD, [facility] 09/20/2020 11:42 PM

## 2020-09-20 NOTE — ED Provider Notes (Signed)
[facility] Emergency Department Provider Note  ____________________________________________  Time seen: Approximately 5:19 AM  I have reviewed the triage vital signs and the nursing notes.   HISTORY  Chief Complaint Chest Pain    HPI Andrew Potts is a 70 y.o. male with a history of CHF COPD CAD status post STEMI 2 months ago who complains of epigastric pain which is nonradiating, no shortness of breath diaphoresis or vomiting.  Occurred at rest at 9 PM after he was in bed, no aggravating or alleviating factors.  He took 3 nitroglycerin at home without relief.  EMS gave 3 additional nitroglycerin as well.  They gave 325 mg of aspirin.  Patient reports compliance with his medications.  No recent exertional symptoms.  The symptoms are constant, moderate intensity, no aggravating or alleviating factors.     Past Medical History:  Diagnosis Date  . Arthritis   . CHF (congestive heart failure) (HCC)   . COPD (chronic obstructive pulmonary disease) (HCC)   . Coronary artery disease    STEMI     Patient Active Problem List   Diagnosis Date Noted  . STEMI involving right coronary artery (HCC) 07/28/2020  . HLD (hyperlipidemia) 07/28/2020  . Leukocytosis 07/28/2020  . Chronic active hepatitis (HCC) 03/11/2019  . Erectile dysfunction 02/04/2019  . COPD (chronic obstructive pulmonary disease) (HCC) 02/04/2019  . Nicotine dependence, cigarettes, w unsp disorders 02/04/2019  . White coat syndrome without diagnosis of hypertension 02/04/2019     Past Surgical History:  Procedure Laterality Date  . ABDOMINAL EXPLORATION SURGERY    . CORONARY/GRAFT ACUTE MI REVASCULARIZATION N/A 07/28/2020   Procedure: CORONARY/GRAFT ACUTE MI REVASCULARIZATION;  Surgeon: Alwyn Pea, MD;  Location: [facility] INVASIVE CV LAB;  Service: Cardiovascular;  Laterality: N/A;  . LEFT HEART CATH AND CORONARY ANGIOGRAPHY N/A 07/28/2020   Procedure: LEFT HEART CATH AND CORONARY  ANGIOGRAPHY;  Surgeon: Alwyn Pea, MD;  Location: [facility] INVASIVE CV LAB;  Service: Cardiovascular;  Laterality: N/A;     Prior to Admission medications   Medication Sig Start Date End Date Taking? Authorizing Provider  aspirin EC 81 MG EC tablet Take 1 tablet (81 mg total) by mouth daily. Swallow whole. 07/31/20   Cipriano Bunker, MD  atorvastatin (LIPITOR) 80 MG tablet Take 1 tablet (80 mg total) by mouth daily. 07/31/20   Cipriano Bunker, MD  clopidogrel (PLAVIX) 75 MG tablet Take by mouth. 08/08/20 08/08/21  [provider]  lisinopril (ZESTRIL) 5 MG tablet Take 1 tablet (5 mg total) by mouth daily. 07/31/20   Cipriano Bunker, MD  metoprolol tartrate (LOPRESSOR) 25 MG tablet Take 0.5 tablets (12.5 mg total) by mouth 2 (two) times daily. 07/30/20   Cipriano Bunker, MD  nitroGLYCERIN (NITROSTAT) 0.4 MG SL tablet Place 1 tablet (0.4 mg total) under the tongue every 5 (five) minutes as needed for chest pain. 07/30/20   Cipriano Bunker, MD  PROAIR HFA 108 [phone] Base) MCG/ACT inhaler Inhale 2 puffs into the lungs every 4 (four) hours as needed. 06/24/20   [name], Corrie Dandy T, NP  SYMBICORT 160-4.5 MCG/ACT inhaler Inhale 2 puffs into the lungs 2 (two) times daily. 04/28/20   [name], NP     Allergies Codeine, Codone [hydrocodone], and Levaquin [levofloxacin]   Family History  Problem Relation Age of Onset  . Alcohol abuse Father   . Alcohol abuse Brother   . Aneurysm Brother     Social History Social History   Tobacco Use  . Smoking status: Former  Smoker    Packs/day: 0.50    Years: 47.00    Pack years: 23.50    Types: Cigarettes    Quit date: 07/28/2020    Years since quitting: 0.1  . Smokeless tobacco: Never Used  . Tobacco comment: 08/24/20 given quit smoking paperwork and encouraged continued cessation  Vaping Use  . Vaping Use: Never used  Substance Use Topics  . Alcohol use: Not Currently  . Drug use: Never    Review of Systems  Constitutional:   No fever or  chills.  ENT:   No sore throat. No rhinorrhea. Cardiovascular:   No chest pain or syncope. Respiratory:   No dyspnea or cough. Gastrointestinal:   Positive as above epigastric pain without vomiting and diarrhea.  Musculoskeletal:   Negative for focal pain or swelling All other systems reviewed and are negative except as documented above in ROS and HPI.  ____________________________________________   PHYSICAL EXAM:  VITAL SIGNS: ED Triage Vitals  Enc Vitals Group     BP 09/20/20 0211 (!) 145/79     Pulse Rate 09/20/20 0211 66     Resp 09/20/20 0211 16     Temp 09/20/20 0211 98.1 F (36.7 C)     Temp Source 09/20/20 0211 Oral     SpO2 09/20/20 0211 95 %     Weight 09/20/20 0210 159 lb (72.1 kg)     Height 09/20/20 0210 5\' 7"  (1.702 m)     Head Circumference --      Peak Flow --      Pain Score 09/20/20 0211 6     Pain Loc --      Pain Edu? --      Excl. in GC? --     Vital signs reviewed, nursing assessments reviewed.   Constitutional:   Alert and oriented. Non-toxic appearance. Eyes:   Conjunctivae are normal. EOMI. PERRL. ENT      Head:   Normocephalic and atraumatic.      Nose:   Wearing a mask.      Mouth/Throat:   Wearing a mask.      Neck:   No meningismus. Full ROM. Hematological/Lymphatic/Immunilogical:   No cervical lymphadenopathy. Cardiovascular:   RRR. Symmetric bilateral radial and DP pulses.  No murmurs. Cap refill less than 2 seconds. Respiratory:   Normal respiratory effort without tachypnea/retractions. Breath sounds are clear and equal bilaterally. No wheezes/rales/rhonchi. Gastrointestinal:   Soft with mild epigastric tenderness. Non distended. There is no CVA tenderness.  No rebound, rigidity, or guarding. Genitourinary:   deferred Musculoskeletal:   Normal range of motion in all extremities. No joint effusions.  No lower extremity tenderness.  No edema. Neurologic:   Normal speech and language.  Motor grossly intact. No acute focal neurologic  deficits are appreciated.  Skin:    Skin is warm, dry and intact. No rash noted.  No petechiae, purpura, or bullae.  ____________________________________________    LABS (pertinent positives/negatives) (all labs ordered are listed, but only abnormal results are displayed) Labs Reviewed  BASIC METABOLIC PANEL - Abnormal; Notable for the following components:      Result Value   Glucose, Bld 150 (*)    All other components within normal limits  CBC - Abnormal; Notable for the following components:   WBC 11.0 (*)    RBC 4.15 (*)    All other components within normal limits  HEPATIC FUNCTION PANEL - Abnormal; Notable for the following components:   ALT 54 (*)    All  other components within normal limits  TROPONIN I (HIGH SENSITIVITY) - Abnormal; Notable for the following components:   Troponin I (High Sensitivity) 22 (*)    All other components within normal limits  TROPONIN I (HIGH SENSITIVITY) - Abnormal; Notable for the following components:   Troponin I (High Sensitivity) 26 (*)    All other components within normal limits  LIPASE, BLOOD  APTT  PROTIME-INR   ____________________________________________   EKG  Interpreted by me Sinus rhythm rate of 67, normal axis and intervals.  Normal QRS ST segments and T waves.  No ischemic changes.  ____________________________________________    RADIOLOGY  DG Chest Port 1 View  Result Date: 09/20/2020 CLINICAL DATA:  Chest pain EXAM: PORTABLE CHEST 1 VIEW COMPARISON:  07/28/2020 FINDINGS: The heart size and mediastinal contours are within normal limits. Both lungs are clear. The visualized skeletal structures are unremarkable. IMPRESSION: No active disease. Electronically Signed   By: Deatra Robinson M.D.   On: 09/20/2020 02:39    ____________________________________________   PROCEDURES .Critical Care Performed by: Sharman Cheek, MD Authorized by: Sharman Cheek, MD   Critical care provider statement:    Critical  care time (minutes):  35   Critical care time was exclusive of:  Separately billable procedures and treating other patients   Critical care was necessary to treat or prevent imminent or life-threatening deterioration of the following conditions:  Cardiac failure   Critical care was time spent personally by me on the following activities:  Development of treatment plan with patient or surrogate, discussions with consultants, evaluation of patient's response to treatment, examination of patient, obtaining history from patient or surrogate, ordering and performing treatments and interventions, ordering and review of laboratory studies, ordering and review of radiographic studies, pulse oximetry, re-evaluation of patient's condition and review of old charts    ____________________________________________  DIFFERENTIAL DIAGNOSIS   Non-STEMI, GERD, pancreatitis, biliary colic  CLINICAL IMPRESSION / ASSESSMENT AND PLAN / ED COURSE  Medications ordered in the ED: Medications  nitroGLYCERIN (NITROGLYN) 2 % ointment 1 inch (has no administration in time range)  morphine 4 MG/ML injection 4 mg (has no administration in time range)  ondansetron (ZOFRAN) injection 4 mg (has no administration in time range)  lactated ringers bolus 1,000 mL (has no administration in time range)  heparin bolus via infusion 4,000 Units (has no administration in time range)    Followed by  heparin ADULT infusion 100 units/mL (25000 units/232mL) (has no administration in time range)  pantoprazole (PROTONIX) injection 40 mg (40 mg Intravenous Given 09/20/20 0323)  alum & mag hydroxide-simeth (MAALOX/MYLANTA) 200-200-20 MG/5ML suspension 30 mL (30 mLs Oral Given 09/20/20 0323)    Pertinent labs & imaging results that were available during my care of the patient were reviewed by me and considered in my medical decision making (see chart for details).  Omega L Delaine was evaluated in Emergency Department on 09/20/2020 for the  symptoms described in the history of present illness. He was evaluated in the context of the global COVID-19 pandemic, which necessitated consideration that the patient might be at risk for infection with the SARS-CoV-2 virus that causes COVID-19. Institutional protocols and algorithms that pertain to the evaluation of patients at risk for COVID-19 are in a state of rapid change based on information released by regulatory bodies including the CDC and federal and state organizations. These policies and algorithms were followed during the patient's care in the ED.   Patient presents with atypical chest pain/epigastric pain.  Suspect GERD.  Will give antacids, check labs including serial troponin given his recent cardiac history.  Clinical Course as of 09/20/20 4782  Tue Sep 20, 2020  [id number] Chest x-ray viewed and interpreted by me, unremarkable.  Radiology report agrees [PS]  [phone] Patient still having persistent severe central chest pain radiating to the back.  Initial work-up unremarkable, chest x-ray nondiagnostic.  Will proceed with CT angiogram to rule out aortic dissection.  Left heart cath from recent STEMI and cardiac arrest reviewed.  Extensive disease in RCA was stented, and there is residual severe disease in the left circumflex.  Will need to hospitalize for further cardiac monitoring and evaluation.  We will place Nitropaste, start heparin infusion for unstable angina. [PS]    Clinical Course User Index [PS] Sharman Cheek, MD     ____________________________________________   FINAL CLINICAL IMPRESSION(S) / ED DIAGNOSES    Final diagnoses:  Unstable angina [facility])     ED Discharge Orders    None      Portions of this note were generated with dragon dictation software. Dictation errors may occur despite best attempts at proofreading.   Sharman Cheek, MD 09/20/20 [phone]

## 2020-09-20 NOTE — ED Notes (Signed)
Report messaged to [employer]

## 2020-09-20 NOTE — ED Notes (Signed)
Pt provided urinal.

## 2020-09-20 NOTE — Progress Notes (Signed)
ANTICOAGULATION CONSULT NOTE - Initial Consult  Pharmacy Consult for Heparin Infusion Indication: ACS/STEMI (unstable angina).  Allergies  Allergen Reactions  . Codeine   . Codone [Hydrocodone] Other (See Comments)    Abdominal pain  . Levaquin [Levofloxacin] Swelling    Patient Measurements: Height: 5\' 7"  (170.2 cm) Weight: 72.1 kg (159 lb) IBW/kg (Calculated) : 66.1 Heparin Dosing Weight: 72.1   Vital Signs: Temp: 98.1 F (36.7 C) (03/08 0211) Temp Source: Oral (03/08 0211) BP: 146/94 (03/08 0500) Pulse Rate: 72 (03/08 0500)  Labs: Recent Labs    09/20/20 0209 09/20/20 0400  HGB 13.8  --   HCT 40.3  --   PLT 221  --   CREATININE 0.70  --   TROPONINIHS 22* 26*    Estimated Creatinine Clearance: 83.8 mL/min (by C-G formula based on SCr of 0.7 mg/dL).   Medical History: Past Medical History:  Diagnosis Date  . Arthritis   . CHF (congestive heart failure) (HCC)   . COPD (chronic obstructive pulmonary disease) (HCC)   . Coronary artery disease    STEMI     Assessment: Pt is 70 yo male c/o sudden onset of central CP.  Pt took NTG x 3 w/ some relief.  Pt had MI Jan 2022 w/ cardiac arrest.  Goal of Therapy:  Heparin level 0.3-0.7 units/ml Monitor platelets by anticoagulation protocol: Yes   Plan:  Give 4000 units bolus x 1 Start heparin infusion at 950 units/hr Check anti-Xa level in 6 hours and daily while on heparin Continue to monitor H&H and platelets  Feb 2022, PharmD, [facility] 09/20/2020 6:34 AM

## 2020-09-21 ENCOUNTER — Encounter
Admission: EM | Disposition: A | Discharge status: Home / Self Care | Payer: Self-pay | Source: Home / Self Care | Attending: Internal Medicine

## 2020-09-21 ENCOUNTER — Observation Stay: Payer: Medicare Other

## 2020-09-21 DIAGNOSIS — K8062 Calculus of gallbladder and bile duct with acute cholecystitis without obstruction: Secondary | ICD-10-CM | POA: Diagnosis present

## 2020-09-21 DIAGNOSIS — Z7982 Long term (current) use of aspirin: Secondary | ICD-10-CM | POA: Diagnosis not present

## 2020-09-21 DIAGNOSIS — Z87891 Personal history of nicotine dependence: Secondary | ICD-10-CM | POA: Diagnosis not present

## 2020-09-21 DIAGNOSIS — K810 Acute cholecystitis: Secondary | ICD-10-CM | POA: Diagnosis not present

## 2020-09-21 DIAGNOSIS — K760 Fatty (change of) liver, not elsewhere classified: Secondary | ICD-10-CM | POA: Diagnosis present

## 2020-09-21 DIAGNOSIS — Z8500 Personal history of malignant neoplasm of unspecified digestive organ: Secondary | ICD-10-CM | POA: Diagnosis not present

## 2020-09-21 DIAGNOSIS — K5750 Diverticulosis of both small and large intestine without perforation or abscess without bleeding: Secondary | ICD-10-CM | POA: Diagnosis present

## 2020-09-21 DIAGNOSIS — M1990 Unspecified osteoarthritis, unspecified site: Secondary | ICD-10-CM | POA: Diagnosis present

## 2020-09-21 DIAGNOSIS — K8020 Calculus of gallbladder without cholecystitis without obstruction: Secondary | ICD-10-CM | POA: Diagnosis not present

## 2020-09-21 DIAGNOSIS — I255 Ischemic cardiomyopathy: Secondary | ICD-10-CM | POA: Diagnosis present

## 2020-09-21 DIAGNOSIS — E785 Hyperlipidemia, unspecified: Secondary | ICD-10-CM | POA: Diagnosis present

## 2020-09-21 DIAGNOSIS — I252 Old myocardial infarction: Secondary | ICD-10-CM | POA: Diagnosis not present

## 2020-09-21 DIAGNOSIS — I200 Unstable angina: Secondary | ICD-10-CM | POA: Diagnosis not present

## 2020-09-21 DIAGNOSIS — Z79899 Other long term (current) drug therapy: Secondary | ICD-10-CM | POA: Diagnosis not present

## 2020-09-21 DIAGNOSIS — Z7902 Long term (current) use of antithrombotics/antiplatelets: Secondary | ICD-10-CM | POA: Diagnosis not present

## 2020-09-21 DIAGNOSIS — K219 Gastro-esophageal reflux disease without esophagitis: Secondary | ICD-10-CM | POA: Diagnosis present

## 2020-09-21 DIAGNOSIS — I110 Hypertensive heart disease with heart failure: Secondary | ICD-10-CM | POA: Diagnosis present

## 2020-09-21 DIAGNOSIS — I25110 Atherosclerotic heart disease of native coronary artery with unstable angina pectoris: Secondary | ICD-10-CM | POA: Diagnosis not present

## 2020-09-21 DIAGNOSIS — K819 Cholecystitis, unspecified: Secondary | ICD-10-CM | POA: Diagnosis present

## 2020-09-21 DIAGNOSIS — Z811 Family history of alcohol abuse and dependence: Secondary | ICD-10-CM | POA: Diagnosis not present

## 2020-09-21 DIAGNOSIS — Z20822 Contact with and (suspected) exposure to covid-19: Secondary | ICD-10-CM | POA: Diagnosis present

## 2020-09-21 DIAGNOSIS — I208 Other forms of angina pectoris: Secondary | ICD-10-CM | POA: Diagnosis not present

## 2020-09-21 DIAGNOSIS — J449 Chronic obstructive pulmonary disease, unspecified: Secondary | ICD-10-CM | POA: Diagnosis present

## 2020-09-21 DIAGNOSIS — Z881 Allergy status to other antibiotic agents status: Secondary | ICD-10-CM | POA: Diagnosis not present

## 2020-09-21 DIAGNOSIS — Z955 Presence of coronary angioplasty implant and graft: Secondary | ICD-10-CM | POA: Diagnosis not present

## 2020-09-21 DIAGNOSIS — I5022 Chronic systolic (congestive) heart failure: Secondary | ICD-10-CM | POA: Diagnosis present

## 2020-09-21 DIAGNOSIS — Z885 Allergy status to narcotic agent status: Secondary | ICD-10-CM | POA: Diagnosis not present

## 2020-09-21 LAB — BASIC METABOLIC PANEL
Anion gap: 9 (ref 5–15)
BUN: 8 mg/dL (ref 8–23)
CO2: 26 mmol/L (ref 22–32)
Calcium: 9 mg/dL (ref 8.9–10.3)
Chloride: 101 mmol/L (ref 98–111)
Creatinine, Ser: 0.62 mg/dL (ref 0.61–1.24)
GFR, Estimated: >60 mL/min (ref >60)
Glucose, Bld: 103 mg/dL — ABNORMAL HIGH (ref 70–99)
Potassium: 4 mmol/L (ref 3.5–5.1)
Sodium: 136 mmol/L (ref 135–145)

## 2020-09-21 LAB — LIPID PANEL
Cholesterol: 105 mg/dL (ref 0–200)
HDL: 40 mg/dL — ABNORMAL LOW (ref >40)
LDL Cholesterol: 54 mg/dL (ref 0–99)
Total CHOL/HDL Ratio: 2.6 RATIO
Triglycerides: 56 mg/dL (ref <150)
VLDL: 11 mg/dL (ref 0–40)

## 2020-09-21 LAB — CBC
HCT: 43.8 % (ref 39.0–52.0)
Hemoglobin: 15 g/dL (ref 13.0–17.0)
MCH: 33.3 pg (ref 26.0–34.0)
MCHC: 34.2 g/dL (ref 30.0–36.0)
MCV: 97.3 fL (ref 80.0–100.0)
Platelets: 219 10*3/uL (ref 150–400)
RBC: 4.5 MIL/uL (ref 4.22–5.81)
RDW: 12.7 % (ref 11.5–15.5)
WBC: 10.9 10*3/uL — ABNORMAL HIGH (ref 4.0–10.5)
nRBC: 0 % (ref 0.0–0.2)

## 2020-09-21 LAB — HEPARIN LEVEL (UNFRACTIONATED): Heparin Unfractionated: 0.5 IU/mL (ref 0.30–0.70)

## 2020-09-21 SURGERY — CHOLECYSTECTOMY, ROBOT-ASSISTED, LAPAROSCOPIC
Anesthesia: General

## 2020-09-21 MED ORDER — ISOSORBIDE MONONITRATE ER 30 MG PO TB24
30.0000 mg | ORAL_TABLET | Freq: Every day | ORAL | Status: DC
Start: 2020-09-21 — End: 2020-09-23
  Administered 2020-09-23: 30 mg via ORAL
  Filled 2020-09-21 (×2): qty 1

## 2020-09-21 MED ORDER — MORPHINE SULFATE (PF) 4 MG/ML IV SOLN
2.9000 mg | Freq: Once | INTRAVENOUS | Status: AC
  Administered 2020-09-21: 2.9 mg via INTRAVENOUS
  Filled 2020-09-21: qty 1

## 2020-09-21 MED ORDER — PANTOPRAZOLE SODIUM 40 MG PO TBEC
40.0000 mg | DELAYED_RELEASE_TABLET | Freq: Every day | ORAL | Status: DC
  Administered 2020-09-23: 40 mg via ORAL
  Filled 2020-09-21 (×2): qty 1

## 2020-09-21 MED ORDER — TECHNETIUM TC 99M MEBROFENIN IV KIT
5.0000 | PACK | Freq: Once | INTRAVENOUS | Status: AC | PRN
  Administered 2020-09-21: 4.736 via INTRAVENOUS

## 2020-09-21 NOTE — Progress Notes (Signed)
[facility] Cardiology    SUBJECTIVE: Patient states to be doing reasonably well has minimal chest discomfort no shortness of breath has been up in her room without any significant symptoms reportedly had an ultrasound for gallbladder evaluation yesterday denies any further nausea or vomiting no fever no dyspnea feels reasonably well   Vitals:   09/20/20 2340 09/21/20 0320 09/21/20 0426 09/21/20 0728  BP: 128/83  125/71 126/80  Pulse: 60  (!) 59 65  Resp: 15  15 18   Temp: 98 F (36.7 C)  98.2 F (36.8 C) 98.2 F (36.8 C)  TempSrc:    Oral  SpO2: 94%  94% 92%  Weight:  69 kg    Height:         Intake/Output Summary (Last 24 hours) at 09/21/2020 0900 Last data filed at 09/21/2020 0532 Gross per 24 hour  Intake 1216.55 ml  Output 1400 ml  Net -183.45 ml      PHYSICAL EXAM  General: Well developed, well nourished, in no acute distress HEENT:  Normocephalic and atramatic Neck:  No JVD.  Lungs: Clear bilaterally to auscultation and percussion. Heart: HRRR . Normal S1 and S2 without gallops or murmurs.  Abdomen: Bowel sounds are positive, abdomen soft and non-tender  Msk:  Back normal, normal gait. Normal strength and tone for age. Extremities: No clubbing, cyanosis or edema.   Neuro: Alert and oriented X 3. Psych:  Good affect, responds appropriately   LABS: Basic Metabolic Panel: Recent Labs    09/20/20 0209 09/21/20 0437  NA 142 136  K 4.0 4.0  CL 108 101  CO2 27 26  GLUCOSE 150* 103*  BUN 15 8  CREATININE 0.70 0.62  CALCIUM 9.5 9.0   Liver Function Tests: Recent Labs    09/20/20 0209  AST 38  ALT 54*  ALKPHOS 74  BILITOT 1.0  PROT 6.9  ALBUMIN 3.8   Recent Labs    09/20/20 0209  LIPASE 34   CBC: Recent Labs    09/20/20 0209 09/21/20 0437  WBC 11.0* 10.9*  HGB 13.8 15.0  HCT 40.3 43.8  MCV 97.1 97.3  PLT 221 219   Cardiac Enzymes: No results for input(s): CKTOTAL, CKMB, CKMBINDEX, TROPONINI in the last 72 hours. BNP: Invalid input(s):  POCBNP D-Dimer: No results for input(s): DDIMER in the last 72 hours. Hemoglobin A1C: No results for input(s): HGBA1C in the last 72 hours. Fasting Lipid Panel: Recent Labs    09/21/20 0437  CHOL 105  HDL 40*  LDLCALC 54  TRIG 56  CHOLHDL 2.6   Thyroid Function Tests: No results for input(s): TSH, T4TOTAL, T3FREE, THYROIDAB in the last 72 hours.  Invalid input(s): FREET3 Anemia Panel: No results for input(s): VITAMINB12, FOLATE, FERRITIN, TIBC, IRON, RETICCTPCT in the last 72 hours.  DG Chest Port 1 View  Result Date: 09/20/2020 CLINICAL DATA:  Chest pain EXAM: PORTABLE CHEST 1 VIEW COMPARISON:  07/28/2020 FINDINGS: The heart size and mediastinal contours are within normal limits. Both lungs are clear. The visualized skeletal structures are unremarkable. IMPRESSION: No active disease. Electronically Signed   By: 07/30/2020 M.D.   On: 09/20/2020 02:39   CT Angio Chest/Abd/Pel for Dissection W and/or Wo Contrast  Result Date: 09/20/2020 CLINICAL DATA:  70 year old male status post STEMI in January. Epigastric pain. EXAM: CT ANGIOGRAPHY CHEST, ABDOMEN AND PELVIS TECHNIQUE: Noncontrast CT images of the chest provided, followed by Multidetector CT imaging through the chest, abdomen and pelvis during bolus administration of intravenous contrast. Multiplanar reconstructed images  and MIPs were obtained and reviewed to evaluate the vascular anatomy. CONTRAST:  OMNIPAQUE IOHEXOL 350 MG/ML SOLN COMPARISON:  Portable chest x-ray earlier today. Report of chest CT 11/12/2002 (no images available). FINDINGS: CTA CHEST FINDINGS Cardiovascular: Calcified coronary artery atherosclerosis (series 16, image 35). No cardiomegaly or pericardial effusion. Thoracic aorta is negative aside from atherosclerosis. Mediastinum/Nodes: Negative.  No lymphadenopathy. Lungs/Pleura: Major airways are patent. Minor chronic scarring in the lungs, greater on the left and with some association to chronic rib fractures  on that side. No pleural effusion or other abnormal pulmonary opacity. Musculoskeletal: Numerous left anterolateral rib deformities, present in January but some incompletely united (series 10, image 65). Similar anterior right rib fractures (3rd through 6th, such as series 10, image 75). No acute osseous abnormality identified. Review of the MIP images confirms the above findings. CTA ABDOMEN AND PELVIS FINDINGS VASCULAR Extensive Aortoiliac calcified atherosclerosis. Negative for dissection or aneurysm. Major arterial structures remain patent despite the atherosclerosis. Review of the MIP images confirms the above findings. NON-VASCULAR Hepatobiliary: Small round circumscribed low-density area in the right lobe measures 13 mm and is most likely a benign cyst or hemangioma. Heterogeneous hepatic steatosis suspected in the background liver, with some sparing including at the gallbladder fossa. No suspicious liver lesion. The gallbladder wall does appear mildly indistinct on coronal image 55, but there is no convincing pericholecystic inflammation. No bile duct enlargement. Pancreas: Negative. Spleen: Negative. Adrenals/Urinary Tract: Negative adrenal glands. Contrast is being excreted from both kidneys and ureters which appear normal. Some contrast also in the urinary bladder which is unremarkable (ureteral jets). Stomach/Bowel: No dilated large or small bowel. Extensive diverticulosis in the descending colon, sigmoid colon, and to a lesser extent at the hepatic flexure. Negative appendix on series 9, image 178. No large bowel inflammation. Gastric fundus somewhat distended with food but the distal stomach is decompressed. 3 cm midline duodenum diverticulum on series 9, image 137 with no active inflammation. No free air, free fluid, or mesenteric stranding identified. Lymphatic: No lymphadenopathy. Reproductive: Negative. Other: No pelvic free fluid. Musculoskeletal: No acute osseous abnormality identified. Review of  the MIP images confirms the above findings. IMPRESSION: 1. Negative for aortic dissection or aneurysm. Extensive Aortic Atherosclerosis (ICD10-I70.0). 2. Questionable gallbladder wall inflammation. Consider follow-up Right Upper Quadrant Ultrasound in this clinical setting. 3. Otherwise no acute or inflammatory process identified in the chest, abdomen, or pelvis. Extensive bilateral rib fractures, some of which remain incompletely healed. Heterogeneous hepatic steatosis suspected, along with small benign cyst or hemangioma in the right lobe of the liver. Multifocal diverticulosis of the colon without active inflammation. Duodenal diverticulum. Electronically Signed   By: Odessa Fleming M.D.   On: 09/20/2020 07:33   US Abdomen Limited RUQ (LIVER/GB)  Result Date: 09/20/2020 CLINICAL DATA:  Epigastric pain beginning last night. EXAM: ULTRASOUND ABDOMEN LIMITED RIGHT UPPER QUADRANT COMPARISON:  CT same day FINDINGS: Gallbladder: Gallbladder wall thickening with some surrounding fluid. No Murphy sign. No visible gallstone. No sludge. Acalculous cholecystitis not excluded. Consider nuclear medicine scan. Common bile duct: Diameter: 5 mm.  Normal. Liver: No focal lesion identified. Within normal limits in parenchymal echogenicity. Portal vein is patent on color Doppler imaging with normal direction of blood flow towards the liver. Insignificant 1.5 cm cyst in the right lobe. Other: None. IMPRESSION: Thickened gallbladder wall with some surrounding fluid. No visible stone or sludge. No Murphy sign. If clinical concern persists, consider nuclear medicine hepatobiliary scan. At this point, acalculous cholecystitis is not excluded based on  the imaging. Electronically Signed   By: Paulina Fusi M.D.   On: 09/20/2020 12:13       TELEMETRY: Normal sinus rhythm nonspecific T2 changes rate of 60  ASSESSMENT AND PLAN:  Principal Problem:   Unstable angina (HCC) Active Problems:   COPD (chronic obstructive pulmonary disease)  (HCC)   CAD, multiple vessel   Ischemic cardiomyopathy Atypical chest pain Possible gallbladder disease Hypertension Hyperlipidemia  Plan Patient appears to have ruled out for myocardial infarction by troponins and EKGs will discontinue any anticoagulation with heparin Continue prasugrel aspirin post PCI and stent Continue blood pressure and post MI metoprolol and lisinopril therapy Continue inhalers for COPD symptoms Agree with Lipitor therapy for hyperlipidemia Consider nitrates like Imdur long-term to see if that adds any benefit thank Recommend increase activity ambulate in the halls Agree with further GI work-up especially for gallbladder and reflux   Alwyn Pea, MD 09/21/2020 9:00 AM

## 2020-09-21 NOTE — Consult Note (Signed)
CARDIOLOGY CONSULT NOTE               Patient ID: Andrew Potts MRN: [id number] DOB/AGE: [birth year] 70 y.o.  Admit date: 09/20/2020 Referring Physician Dr. Dartha Lodge hospitalist Primary Physician Dr. Aura Dials NP Primary Cardiologist Mariel Kansky MD Reason for Consultation chest pain known coronary disease  HPI: Patient is a 70 year old male history of multivessel coronary disease including recent GI malignancy N Salmonella o short NSTEMI with PCI and stent to RCA January 2022.  Received a DES stent has had a ischemic cardiomyopathy EF around 40 to 45% he has COPD hyperlipidemia hypertension has been doing reasonably well but presented with sudden onset of central left-sided chest pain mostly in the lower sternal area presented with significant symptoms on the night before after he ate a cheeseburger.  He described it as a 10 pain nonradiating he had some nausea and dry heaves.  He took a sublingual nitroglycerin that he thought may relieve the symptoms but then they came back again.  He took 2 more sublingual nitroglycerin with transient relief.  Patient was brought in via EMS and got Zofran aspirin as well as nitroglycerin tablets.  He felt lightheaded dizzy continue to have centralized localized lower sternal discomfort denies any palpitation diaphoresis shortness of breath fever chills or sweats no nausea he had nausea with dry heaves he has had no headaches no blurred vision no urinary symptoms EKG in emergency room was nondiagnostic and nonacute there was some concern for elevated LFTs and possible gallbladder problems based on CT. patient was started on heparin for possible ACS enzymes have been unremarkable  Review of systems complete and found to be negative unless listed above     Past Medical History:  Diagnosis Date  . Arthritis   . CHF (congestive heart failure) (HCC)   . COPD (chronic obstructive pulmonary disease) (HCC)   . Coronary artery disease    STEMI    Past  Surgical History:  Procedure Laterality Date  . ABDOMINAL EXPLORATION SURGERY    . CORONARY/GRAFT ACUTE MI REVASCULARIZATION N/A 07/28/2020   Procedure: CORONARY/GRAFT ACUTE MI REVASCULARIZATION;  Surgeon: Alwyn Pea, MD;  Location: [facility] INVASIVE CV LAB;  Service: Cardiovascular;  Laterality: N/A;  . LEFT HEART CATH AND CORONARY ANGIOGRAPHY N/A 07/28/2020   Procedure: LEFT HEART CATH AND CORONARY ANGIOGRAPHY;  Surgeon: Alwyn Pea, MD;  Location: [facility] INVASIVE CV LAB;  Service: Cardiovascular;  Laterality: N/A;    Medications Prior to Admission  Medication Sig Dispense Refill Last Dose  . aspirin EC 81 MG EC tablet Take 1 tablet (81 mg total) by mouth daily. Swallow whole. 30 tablet 11 09/19/2020 at Unknown time  . atorvastatin (LIPITOR) 80 MG tablet Take 1 tablet (80 mg total) by mouth daily. 90 tablet 1 09/19/2020 at Unknown time  . lisinopril (ZESTRIL) 5 MG tablet Take 1 tablet (5 mg total) by mouth daily. 30 tablet 1 09/19/2020 at Unknown time  . metoprolol tartrate (LOPRESSOR) 25 MG tablet Take 0.5 tablets (12.5 mg total) by mouth 2 (two) times daily. 60 tablet 1 09/19/2020 at Unknown time  . mometasone-formoterol (DULERA) 200-5 MCG/ACT AERO Inhale 2 puffs into the lungs in the morning and at bedtime.     . prasugrel (EFFIENT) 10 MG TABS tablet Take 10 mg by mouth daily.   09/19/2020 at Unknown time  . SYMBICORT 160-4.5 MCG/ACT inhaler Inhale 2 puffs into the lungs 2 (two) times daily. 10.2 g 5 09/19/2020 at Unknown time  . nitroGLYCERIN (NITROSTAT)  0.4 MG SL tablet Place 1 tablet (0.4 mg total) under the tongue every 5 (five) minutes as needed for chest pain. 30 tablet 12 PRN  . PROAIR HFA 108 (90 Base) MCG/ACT inhaler Inhale 2 puffs into the lungs every 4 (four) hours as needed. 18 g 4 PRN   Social History   Socioeconomic History  . Marital status: Divorced    Spouse name: Not on file  . Number of children: Not on file  . Years of education: Not on file  . Highest education level:  Not on file  Occupational History  . Occupation: unload trucks    Comment: [employer]  Tobacco Use  . Smoking status: Former Smoker    Packs/day: 0.50    Years: 47.00    Pack years: 23.50    Types: Cigarettes    Quit date: 07/28/2020    Years since quitting: 0.1  . Smokeless tobacco: Never Used  . Tobacco comment: 08/24/20 given quit smoking paperwork and encouraged continued cessation  Vaping Use  . Vaping Use: Never used  Substance and Sexual Activity  . Alcohol use: Not Currently  . Drug use: Never  . Sexual activity: Yes  Other Topics Concern  . Not on file  Social History Narrative  . Not on file   Social Determinants of Health   Financial Resource Strain: Not on file  Food Insecurity: Not on file  Transportation Needs: Not on file  Physical Activity: Not on file  Stress: Not on file  Social Connections: Not on file  Intimate Partner Violence: Not on file    Family History  Problem Relation Age of Onset  . Alcohol abuse Father   . Alcohol abuse Brother   . Aneurysm Brother       Review of systems complete and found to be negative unless listed above      PHYSICAL EXAM  General: Well developed, well nourished, in no acute distress HEENT:  Normocephalic and atramatic Neck:  No JVD.  Lungs: Clear bilaterally to auscultation and percussion. Heart: HRRR . Normal S1 and S2 without gallops or murmurs.  Abdomen: Bowel sounds are positive, abdomen soft and non-tender  Msk:  Back normal, normal gait. Normal strength and tone for age. Extremities: No clubbing, cyanosis or edema.   Neuro: Alert and oriented X 3. Psych:  Good affect, responds appropriately  Labs:   Lab Results  Component Value Date   WBC 10.9 (H) 09/21/2020   HGB 15.0 09/21/2020   HCT 43.8 09/21/2020   MCV 97.3 09/21/2020   PLT 219 09/21/2020    Recent Labs  Lab 09/20/20 0209 09/21/20 0437  NA 142 136  K 4.0 4.0  CL 108 101  CO2 27 26  BUN 15 8  CREATININE 0.70 0.62  CALCIUM 9.5 9.0   PROT 6.9  --   BILITOT 1.0  --   ALKPHOS 74  --   ALT 54*  --   AST 38  --   GLUCOSE 150* 103*   No results found for: CKTOTAL, CKMB, CKMBINDEX, TROPONINI  Lab Results  Component Value Date   CHOL 105 09/21/2020   CHOL 117 08/10/2020   CHOL 161 07/29/2020   Lab Results  Component Value Date   HDL 40 (L) 09/21/2020   HDL 42 08/10/2020   HDL 40 (L) 07/29/2020   Lab Results  Component Value Date   LDLCALC 54 09/21/2020   LDLCALC 61 08/10/2020   LDLCALC 106 (H) 07/29/2020   Lab Results  Component  Value Date   TRIG 56 09/21/2020   TRIG 66 08/10/2020   TRIG 74 07/29/2020   Lab Results  Component Value Date   CHOLHDL 2.6 09/21/2020   CHOLHDL 4.0 07/29/2020   No results found for: LDLDIRECT    Radiology: [facility] Chest Port 1 View  Result Date: 09/20/2020 CLINICAL DATA:  Chest pain EXAM: PORTABLE CHEST 1 VIEW COMPARISON:  07/28/2020 FINDINGS: The heart size and mediastinal contours are within normal limits. Both lungs are clear. The visualized skeletal structures are unremarkable. IMPRESSION: No active disease. Electronically Signed   By: Deatra Robinson M.D.   On: 09/20/2020 02:39   CT Angio Chest/Abd/Pel for Dissection W and/or Wo Contrast  Result Date: 09/20/2020 CLINICAL DATA:  70 year old male status post STEMI in January. Epigastric pain. EXAM: CT ANGIOGRAPHY CHEST, ABDOMEN AND PELVIS TECHNIQUE: Noncontrast CT images of the chest provided, followed by Multidetector CT imaging through the chest, abdomen and pelvis during bolus administration of intravenous contrast. Multiplanar reconstructed images and MIPs were obtained and reviewed to evaluate the vascular anatomy. CONTRAST:  OMNIPAQUE IOHEXOL 350 MG/ML SOLN COMPARISON:  Portable chest x-ray earlier today. Report of chest CT 11/12/2002 (no images available). FINDINGS: CTA CHEST FINDINGS Cardiovascular: Calcified coronary artery atherosclerosis (series 16, image 35). No cardiomegaly or pericardial effusion. Thoracic aorta is  negative aside from atherosclerosis. Mediastinum/Nodes: Negative.  No lymphadenopathy. Lungs/Pleura: Major airways are patent. Minor chronic scarring in the lungs, greater on the left and with some association to chronic rib fractures on that side. No pleural effusion or other abnormal pulmonary opacity. Musculoskeletal: Numerous left anterolateral rib deformities, present in January but some incompletely united (series 10, image 65). Similar anterior right rib fractures (3rd through 6th, such as series 10, image 75). No acute osseous abnormality identified. Review of the MIP images confirms the above findings. CTA ABDOMEN AND PELVIS FINDINGS VASCULAR Extensive Aortoiliac calcified atherosclerosis. Negative for dissection or aneurysm. Major arterial structures remain patent despite the atherosclerosis. Review of the MIP images confirms the above findings. NON-VASCULAR Hepatobiliary: Small round circumscribed low-density area in the right lobe measures 13 mm and is most likely a benign cyst or hemangioma. Heterogeneous hepatic steatosis suspected in the background liver, with some sparing including at the gallbladder fossa. No suspicious liver lesion. The gallbladder wall does appear mildly indistinct on coronal image 55, but there is no convincing pericholecystic inflammation. No bile duct enlargement. Pancreas: Negative. Spleen: Negative. Adrenals/Urinary Tract: Negative adrenal glands. Contrast is being excreted from both kidneys and ureters which appear normal. Some contrast also in the urinary bladder which is unremarkable (ureteral jets). Stomach/Bowel: No dilated large or small bowel. Extensive diverticulosis in the descending colon, sigmoid colon, and to a lesser extent at the hepatic flexure. Negative appendix on series 9, image 178. No large bowel inflammation. Gastric fundus somewhat distended with food but the distal stomach is decompressed. 3 cm midline duodenum diverticulum on series 9, image 137 with  no active inflammation. No free air, free fluid, or mesenteric stranding identified. Lymphatic: No lymphadenopathy. Reproductive: Negative. Other: No pelvic free fluid. Musculoskeletal: No acute osseous abnormality identified. Review of the MIP images confirms the above findings. IMPRESSION: 1. Negative for aortic dissection or aneurysm. Extensive Aortic Atherosclerosis (ICD10-I70.0). 2. Questionable gallbladder wall inflammation. Consider follow-up Right Upper Quadrant Ultrasound in this clinical setting. 3. Otherwise no acute or inflammatory process identified in the chest, abdomen, or pelvis. Extensive bilateral rib fractures, some of which remain incompletely healed. Heterogeneous hepatic steatosis suspected, along with small benign cyst or  hemangioma in the right lobe of the liver. Multifocal diverticulosis of the colon without active inflammation. Duodenal diverticulum. Electronically Signed   By: Odessa Fleming M.D.   On: 09/20/2020 07:33   US Abdomen Limited RUQ (LIVER/GB)  Result Date: 09/20/2020 CLINICAL DATA:  Epigastric pain beginning last night. EXAM: ULTRASOUND ABDOMEN LIMITED RIGHT UPPER QUADRANT COMPARISON:  CT same day FINDINGS: Gallbladder: Gallbladder wall thickening with some surrounding fluid. No Murphy sign. No visible gallstone. No sludge. Acalculous cholecystitis not excluded. Consider nuclear medicine scan. Common bile duct: Diameter: 5 mm.  Normal. Liver: No focal lesion identified. Within normal limits in parenchymal echogenicity. Portal vein is patent on color Doppler imaging with normal direction of blood flow towards the liver. Insignificant 1.5 cm cyst in the right lobe. Other: None. IMPRESSION: Thickened gallbladder wall with some surrounding fluid. No visible stone or sludge. No Murphy sign. If clinical concern persists, consider nuclear medicine hepatobiliary scan. At this point, acalculous cholecystitis is not excluded based on the imaging. Electronically Signed   By: Paulina Fusi M.D.    On: 09/20/2020 12:13    EKG: Normal sinus rhythm nonspecific ST-T wave changes rate of 65  ASSESSMENT AND PLAN:  Chest pain possible angina Known coronary artery disease History of STEMI with PCI and stent COPD Ischemic cardiomyopathy EF of around 40% Hypertension Hyperlipidemia GERD Possible gallbladder disease . Plan Agree with admit follow-up EKGs troponins Continue current medication including aspirin Plavix beta-blocker Continue statin therapy for hyperlipidemia Agree with inhalers as necessary for COPD Do not recommend any invasive procedures at this point Consider functional study possibly as an outpatient for what I consider to be atypical chest pain Recommend pursue GI work-up including gallbladder evaluation We will discontinue heparin with negative troponins Recommend increase activity with ambulation in the hall Conservative cardiac input at this point   Signed: Alwyn Pea MD 09/21/2020, 8:47 AM

## 2020-09-21 NOTE — Progress Notes (Signed)
ANTICOAGULATION CONSULT NOTE  Pharmacy Consult for Heparin Infusion Indication: ACS/STEMI (unstable angina).  Allergies  Allergen Reactions  . Codeine   . Codone [Hydrocodone] Other (See Comments)    Abdominal pain  . Levaquin [Levofloxacin] Swelling    Patient Measurements: Height: 5\' 7"  (170.2 cm) Weight: 69 kg (152 lb 3.2 oz) IBW/kg (Calculated) : 66.1 Heparin Dosing Weight: 72.1   Vital Signs: Temp: 98.2 F (36.8 C) (03/09 0426) Temp Source: Oral (03/08 2111) BP: 125/71 (03/09 0426) Pulse Rate: 59 (03/09 0426)  Labs: Recent Labs    09/20/20 0209 09/20/20 0400 09/20/20 0924 09/20/20 1638 09/20/20 2258 09/21/20 0437  HGB 13.8  --   --   --   --  15.0  HCT 40.3  --   --   --   --  43.8  PLT 221  --   --   --   --  219  APTT 29  --   --   --   --   --   LABPROT 13.8  --   --   --   --   --   INR 1.1  --   --   --   --   --   HEPARINUNFRC  --   --   --  0.37 0.48 0.50  CREATININE 0.70  --   --   --   --   --   TROPONINIHS 22* 26* 31* 35*  --   --     Estimated Creatinine Clearance: 83.8 mL/min (by C-G formula based on SCr of 0.7 mg/dL).   Medical History: Past Medical History:  Diagnosis Date  . Arthritis   . CHF (congestive heart failure) (HCC)   . COPD (chronic obstructive pulmonary disease) (HCC)   . Coronary artery disease    STEMI     Assessment: Pt is 70 yo male c/o sudden onset of central CP.  Pt took NTG x 3 w/ some relief.  Pt had MI Jan 2022 w/ cardiac arrest.  Goal of Therapy:  Heparin level 0.3-0.7 units/ml Monitor platelets by anticoagulation protocol: Yes   Plan:  3/8 1638 HL 0.37, therapeutic x 1 3/8 2258 HL 0.48, therapeutic x 2 3/8 0437 HL 0.50, therapeutic  Continue heparin drip at 950 units/hr. Recheck HL daily with AM  labs. CBC daily while on heparin drip.  5/8, PharmD, [facility] 09/21/2020 5:03 AM

## 2020-09-21 NOTE — Progress Notes (Signed)
PROGRESS NOTE    Andrew Potts  AUQ:[id number] DOB: 12-10-1950 DOA: 09/20/2020 PCP: [name], NP   Brief Narrative: Taken from H&P. Andrew Potts is a 70 y.o. male with medical history significant for multivessel coronary artery disease status post recent PCI with stent angioplasty to the RCA following a non-ST elevation MI, ischemic cardiomyopathy with last known LVEF of 40 to 45%, COPD who presents to the emergency room via EMS for evaluation of sudden onset central chest pain mostly in the lower sternal area.  Symptoms did not resolved completely with multiple nitroglycerin.  CTA was negative for PE and a questionable gallbladder wall inflammation, right upper quadrant ultrasound with some bladder wall thickening and recommending nuclear medicine studies, HIDA scan with some concern of cystic duct/cholecystitis. EKG without any acute abnormality and troponin barely positive, cardiology cleared him from cardiac standpoint. Surgery was consulted for concern of HIDA scan findings-planning to put a percutaneous drain tomorrow as they will try to postpone surgery till April secondary to recent PCI and MI.  Subjective: Patient continued to have mild epigastric discomfort.  No nausea or vomiting.  Waiting for HIDA scan.  Ambulated in the hallway without any change in his pain.  Assessment & Plan:   Principal Problem:   Unstable angina (HCC) Active Problems:   COPD (chronic obstructive pulmonary disease) (HCC)   CAD, multiple vessel   Ischemic cardiomyopathy   Cholecystitis  Symptomatic choledocholithiasis/cholecystitis.  No obvious obstructing stone but HIDA scan concerning for cystic duct/gallbladder inflammation. General surgery was consulted and they are recommending gallbladder drain placement tomorrow as they will try to postpone surgery till April secondary to his recent MI and PCI. -Continue with pain management.  Unstable angina.  He was initially admitted with concern of  unstable angina due to his extensive cardiac history and recent NSTEMI s/p PCI. Cardiology cleared him from cardiac standpoint. -Continue home dose of aspirin, Plavix, statin and metoprolol.  History of ischemic cardiomyopathy with HFrEF.  Appears euvolemic -Continue home dose of metoprolol and lisinopril  COPD.  No acute concern -Continue with as needed bronchodilators  Objective: Vitals:   09/21/20 0320 09/21/20 0426 09/21/20 0728 09/21/20 1116  BP:  125/71 126/80 127/81  Pulse:  (!) 59 65 62  Resp:  15 18 17   Temp:  98.2 F (36.8 C) 98.2 F (36.8 C) 99.3 F (37.4 C)  TempSrc:   Oral Oral  SpO2:  94% 92% 98%  Weight: 69 kg     Height:        Intake/Output Summary (Last 24 hours) at 09/21/2020 1653 Last data filed at 09/21/2020 1541 Gross per 24 hour  Intake 216.55 ml  Output 1550 ml  Net -1333.45 ml   Filed Weights   09/20/20 0210 09/21/20 0320  Weight: 72.1 kg 69 kg    Examination:  General exam: Appears calm and comfortable  Respiratory system: Clear to auscultation. Respiratory effort normal. Cardiovascular system: S1 & S2 heard, RRR.  Gastrointestinal system: Soft, nontender, nondistended, bowel sounds positive. Central nervous system: Alert and oriented. No focal neurological deficits. Extremities: No edema, no cyanosis, pulses intact and symmetrical. Psychiatry: Judgement and insight appear normal. Mood & affect appropriate.    DVT prophylaxis: Heparin Code Status: Full Family Communication: Discussed with patient Disposition Plan:  Status is: Inpatient  Remains inpatient appropriate because:Inpatient level of care appropriate due to severity of illness   Dispo: The patient is from: Home  Anticipated d/c is to: Home              Patient currently is not medically stable to d/c.   Difficult to place patient No               Level of care: Med-Surg  All the records are reviewed and case discussed with Care Management/Social  Worker. Management plans discussed with the patient, nursing and they are in agreement.  Consultants:   Cardiology  General surgery  Procedures:  Antimicrobials:   Data Reviewed: I have personally reviewed following labs and imaging studies  CBC: Recent Labs  Lab 09/20/20 0209 09/21/20 0437  WBC 11.0* 10.9*  HGB 13.8 15.0  HCT 40.3 43.8  MCV 97.1 97.3  PLT 221 219   Basic Metabolic Panel: Recent Labs  Lab 09/20/20 0209 09/21/20 0437  NA 142 136  K 4.0 4.0  CL 108 101  CO2 27 26  GLUCOSE 150* 103*  BUN 15 8  CREATININE 0.70 0.62  CALCIUM 9.5 9.0   GFR: Estimated Creatinine Clearance: 83.8 mL/min (by C-G formula based on SCr of 0.62 mg/dL). Liver Function Tests: Recent Labs  Lab 09/20/20 0209  AST 38  ALT 54*  ALKPHOS 74  BILITOT 1.0  PROT 6.9  ALBUMIN 3.8   Recent Labs  Lab 09/20/20 0209  LIPASE 34   No results for input(s): AMMONIA in the last 168 hours. Coagulation Profile: Recent Labs  Lab 09/20/20 0209  INR 1.1   Cardiac Enzymes: No results for input(s): CKTOTAL, CKMB, CKMBINDEX, TROPONINI in the last 168 hours. BNP (last 3 results) No results for input(s): PROBNP in the last 8760 hours. HbA1C: No results for input(s): HGBA1C in the last 72 hours. CBG: No results for input(s): GLUCAP in the last 168 hours. Lipid Profile: Recent Labs    09/21/20 0437  CHOL 105  HDL 40*  LDLCALC 54  TRIG 56  CHOLHDL 2.6   Thyroid Function Tests: No results for input(s): TSH, T4TOTAL, FREET4, T3FREE, THYROIDAB in the last 72 hours. Anemia Panel: No results for input(s): VITAMINB12, FOLATE, FERRITIN, TIBC, IRON, RETICCTPCT in the last 72 hours. Sepsis Labs: No results for input(s): PROCALCITON, LATICACIDVEN in the last 168 hours.  Recent Results (from the past 240 hour(s))  SARS CORONAVIRUS 2 (TAT 6-24 HRS) Nasopharyngeal Nasopharyngeal Swab     Status: None   Collection Time: 09/20/20  7:06 AM   Specimen: Nasopharyngeal Swab  Result Value  Ref Range Status   SARS Coronavirus 2 NEGATIVE NEGATIVE Final    Comment: (NOTE) SARS-CoV-2 target nucleic acids are NOT DETECTED.  The SARS-CoV-2 RNA is generally detectable in upper and lower respiratory specimens during the acute phase of infection. Negative results do not preclude SARS-CoV-2 infection, do not rule out co-infections with other pathogens, and should not be used as the sole basis for treatment or other patient management decisions. Negative results must be combined with clinical observations, patient history, and epidemiological information. The expected result is Negative.  Fact Sheet for Patients: HairSlick.no  Fact Sheet for Healthcare Providers: quierodirigir.com  This test is not yet approved or cleared by the [place] FDA and  has been authorized for detection and/or diagnosis of SARS-CoV-2 by FDA under an Emergency Use Authorization (EUA). This EUA will remain  in effect (meaning this test can be used) for the duration of the COVID-19 declaration under Se ction 564(b)(1) of the Act, 21 U.S.C. section 360bbb-3(b)(1), unless the authorization is terminated or revoked sooner.  Performed  at [facility], [address], [place], [place] [address]      Radiology Studies: NM Hepato W/EF  Result Date: 09/21/2020 CLINICAL DATA:  Cholelithiasis. Please perform nuclear medicine HIDA scan to evaluate for cystic duct occlusion. EXAM: NUCLEAR MEDICINE HEPATOBILIARY IMAGING TECHNIQUE: Sequential images of the abdomen were obtained out to 60 minutes following intravenous administration of radiopharmaceutical. RADIOPHARMACEUTICALS:  4.7 mCi Tc-91m  Choletec IV MEDICATIONS: Morphine 2.9 mg IV, administered for HIDA scan augmentation given nonvisualization of the gallbladder after 1 hour of imaging. COMPARISON:  Right upper quadrant abdominal ultrasound-09/20/2020; CT abdomen pelvis-09/20/2020 FINDINGS: There is  homogeneous distribution of injected radiotracer throughout the hepatic parenchyma. There is early excretion of radiotracer with opacification of the proximal small bowel, initially seen on the 15 minute anterior projection planar image. Despite imaging for 1 hour, there is nonvisualization of the gallbladder and as such the examination was augmented with the administration of intravenous morphine. Despite the administration of morphine and imaging for an additional 30 minutes, there is continued nonvisualization of the gallbladder, findings suggestive of cystic duct occlusion. IMPRESSION: Nonvisualization of the gallbladder, despite examination augmentation with morphine, findings suggestive of cystic duct occlusion/acute cholecystitis. These results will be called to the ordering clinician or representative by the Radiologist Assistant, and communication documented in the PACS or [employer]. Electronically Signed   By: Simonne Come M.D.   On: 09/21/2020 15:10   DG Chest Port 1 View  Result Date: 09/20/2020 CLINICAL DATA:  Chest pain EXAM: PORTABLE CHEST 1 VIEW COMPARISON:  07/28/2020 FINDINGS: The heart size and mediastinal contours are within normal limits. Both lungs are clear. The visualized skeletal structures are unremarkable. IMPRESSION: No active disease. Electronically Signed   By: Deatra Robinson M.D.   On: 09/20/2020 02:39   CT Angio Chest/Abd/Pel for Dissection W and/or Wo Contrast  Result Date: 09/20/2020 CLINICAL DATA:  70 year old male status post STEMI in January. Epigastric pain. EXAM: CT ANGIOGRAPHY CHEST, ABDOMEN AND PELVIS TECHNIQUE: Noncontrast CT images of the chest provided, followed by Multidetector CT imaging through the chest, abdomen and pelvis during bolus administration of intravenous contrast. Multiplanar reconstructed images and MIPs were obtained and reviewed to evaluate the vascular anatomy. CONTRAST:  OMNIPAQUE IOHEXOL 350 MG/ML SOLN COMPARISON:  Portable chest x-ray  earlier today. Report of chest CT 11/12/2002 (no images available). FINDINGS: CTA CHEST FINDINGS Cardiovascular: Calcified coronary artery atherosclerosis (series 16, image 35). No cardiomegaly or pericardial effusion. Thoracic aorta is negative aside from atherosclerosis. Mediastinum/Nodes: Negative.  No lymphadenopathy. Lungs/Pleura: Major airways are patent. Minor chronic scarring in the lungs, greater on the left and with some association to chronic rib fractures on that side. No pleural effusion or other abnormal pulmonary opacity. Musculoskeletal: Numerous left anterolateral rib deformities, present in January but some incompletely united (series 10, image 65). Similar anterior right rib fractures (3rd through 6th, such as series 10, image 75). No acute osseous abnormality identified. Review of the MIP images confirms the above findings. CTA ABDOMEN AND PELVIS FINDINGS VASCULAR Extensive Aortoiliac calcified atherosclerosis. Negative for dissection or aneurysm. Major arterial structures remain patent despite the atherosclerosis. Review of the MIP images confirms the above findings. NON-VASCULAR Hepatobiliary: Small round circumscribed low-density area in the right lobe measures 13 mm and is most likely a benign cyst or hemangioma. Heterogeneous hepatic steatosis suspected in the background liver, with some sparing including at the gallbladder fossa. No suspicious liver lesion. The gallbladder wall does appear mildly indistinct on coronal image 55, but there is  no convincing pericholecystic inflammation. No bile duct enlargement. Pancreas: Negative. Spleen: Negative. Adrenals/Urinary Tract: Negative adrenal glands. Contrast is being excreted from both kidneys and ureters which appear normal. Some contrast also in the urinary bladder which is unremarkable (ureteral jets). Stomach/Bowel: No dilated large or small bowel. Extensive diverticulosis in the descending colon, sigmoid colon, and to a lesser extent at the  hepatic flexure. Negative appendix on series 9, image 178. No large bowel inflammation. Gastric fundus somewhat distended with food but the distal stomach is decompressed. 3 cm midline duodenum diverticulum on series 9, image 137 with no active inflammation. No free air, free fluid, or mesenteric stranding identified. Lymphatic: No lymphadenopathy. Reproductive: Negative. Other: No pelvic free fluid. Musculoskeletal: No acute osseous abnormality identified. Review of the MIP images confirms the above findings. IMPRESSION: 1. Negative for aortic dissection or aneurysm. Extensive Aortic Atherosclerosis (ICD10-I70.0). 2. Questionable gallbladder wall inflammation. Consider follow-up Right Upper Quadrant Ultrasound in this clinical setting. 3. Otherwise no acute or inflammatory process identified in the chest, abdomen, or pelvis. Extensive bilateral rib fractures, some of which remain incompletely healed. Heterogeneous hepatic steatosis suspected, along with small benign cyst or hemangioma in the right lobe of the liver. Multifocal diverticulosis of the colon without active inflammation. Duodenal diverticulum. Electronically Signed   By: Odessa Fleming M.D.   On: 09/20/2020 07:33   US Abdomen Limited RUQ (LIVER/GB)  Result Date: 09/20/2020 CLINICAL DATA:  Epigastric pain beginning last night. EXAM: ULTRASOUND ABDOMEN LIMITED RIGHT UPPER QUADRANT COMPARISON:  CT same day FINDINGS: Gallbladder: Gallbladder wall thickening with some surrounding fluid. No Murphy sign. No visible gallstone. No sludge. Acalculous cholecystitis not excluded. Consider nuclear medicine scan. Common bile duct: Diameter: 5 mm.  Normal. Liver: No focal lesion identified. Within normal limits in parenchymal echogenicity. Portal vein is patent on color Doppler imaging with normal direction of blood flow towards the liver. Insignificant 1.5 cm cyst in the right lobe. Other: None. IMPRESSION: Thickened gallbladder wall with some surrounding fluid. No  visible stone or sludge. No Murphy sign. If clinical concern persists, consider nuclear medicine hepatobiliary scan. At this point, acalculous cholecystitis is not excluded based on the imaging. Electronically Signed   By: Paulina Fusi M.D.   On: 09/20/2020 12:13    Scheduled Meds: . aspirin EC  81 mg Oral Daily  . atorvastatin  80 mg Oral QPM  . isosorbide mononitrate  30 mg Oral Daily  . lisinopril  5 mg Oral Daily  . metoprolol tartrate  12.5 mg Oral BID  . mometasone-formoterol  2 puff Inhalation BID  . pantoprazole  40 mg Oral Daily  . prasugrel  10 mg Oral Daily   Continuous Infusions:   LOS: 0 days   Time spent: 35 minutes. More than 50% of the time was spent in counseling/coordination of care  Arnetha Courser, MD [facility]  If 7PM-7AM, please contact night-coverage [email or web address]  09/21/2020, 4:53 PM   This record has been created using Conservation officer, historic buildings. Errors have been sought and corrected,but may not always be located. Such creation errors do not reflect on the standard of care.

## 2020-09-21 NOTE — Plan of Care (Signed)
Received pt from ED. Pt is independent. AAOx4. Pt denies any chest pain. VSS. Pt on heparin drip. Tolerating well. All needs attended. Call light is within reach. Safety measures maintained. Will continue to monitor.    Problem: Education: Goal: Knowledge of General Education information will improve Description: Including pain rating scale, medication(s)/side effects and non-pharmacologic comfort measures Outcome: Progressing   Problem: Health Behavior/Discharge Planning: Goal: Ability to manage health-related needs will improve Outcome: Progressing   Problem: Clinical Measurements: Goal: Ability to maintain clinical measurements within normal limits will improve Outcome: Progressing Goal: Will remain free from infection Outcome: Progressing Goal: Diagnostic test results will improve Outcome: Progressing Goal: Respiratory complications will improve Outcome: Progressing Goal: Cardiovascular complication will be avoided Outcome: Progressing   Problem: Activity: Goal: Risk for activity intolerance will decrease Outcome: Progressing   Problem: Nutrition: Goal: Adequate nutrition will be maintained Outcome: Progressing   Problem: Coping: Goal: Level of anxiety will decrease Outcome: Progressing   Problem: Elimination: Goal: Will not experience complications related to bowel motility Outcome: Progressing Goal: Will not experience complications related to urinary retention Outcome: Progressing   Problem: Pain Managment: Goal: General experience of comfort will improve Outcome: Progressing   Problem: Safety: Goal: Ability to remain free from injury will improve Outcome: Progressing   Problem: Skin Integrity: Goal: Risk for impaired skin integrity will decrease Outcome: Progressing   Problem: Education: Goal: Understanding of CV disease, CV risk reduction, and recovery process will improve Outcome: Progressing Goal: Individualized Educational Video(s) Outcome:  Progressing   Problem: Activity: Goal: Ability to return to baseline activity level will improve Outcome: Progressing   Problem: Cardiovascular: Goal: Ability to achieve and maintain adequate cardiovascular perfusion will improve Outcome: Progressing Goal: Vascular access site(s) Level 0-1 will be maintained Outcome: Progressing   Problem: Health Behavior/Discharge Planning: Goal: Ability to safely manage health-related needs after discharge will improve Outcome: Progressing

## 2020-09-21 NOTE — Consult Note (Signed)
Patient ID: Andrew Potts, male   DOB: 27-Apr-1951, 71 y.o.   MRN: [id number]  Chief Complaint: Cholecystitis  History of Present Illness Andrew Potts is a 70 y.o. male was admitted 2 days ago with severe epigastric pain.  He has a cardiac history, with an MI diagnosed July 28, 2020.  Admitted for further cardiac work-up, subsequent revelation of possible cholecystitis and HIDA scan consistent with possible acute cholecystitis.  However his white blood cell count remains normal he has mild right upper quadrant tenderness.  And he seems to be tolerating a diet with an appetite.  However he is very concerned about recurrence of his symptoms. Cardiac etiology is ruled out during this admission.    Past Medical History Past Medical History:  Diagnosis Date  . Arthritis   . CHF (congestive heart failure) (HCC)   . COPD (chronic obstructive pulmonary disease) (HCC)   . Coronary artery disease    STEMI      Past Surgical History:  Procedure Laterality Date  . ABDOMINAL EXPLORATION SURGERY    . CORONARY/GRAFT ACUTE MI REVASCULARIZATION N/A 07/28/2020   Procedure: CORONARY/GRAFT ACUTE MI REVASCULARIZATION;  Surgeon: Alwyn Pea, MD;  Location: [facility] INVASIVE CV LAB;  Service: Cardiovascular;  Laterality: N/A;  . LEFT HEART CATH AND CORONARY ANGIOGRAPHY N/A 07/28/2020   Procedure: LEFT HEART CATH AND CORONARY ANGIOGRAPHY;  Surgeon: Alwyn Pea, MD;  Location: [facility] INVASIVE CV LAB;  Service: Cardiovascular;  Laterality: N/A;    Allergies  Allergen Reactions  . Codeine   . Codone [Hydrocodone] Other (See Comments)    Abdominal pain  . Levaquin [Levofloxacin] Swelling    Current Facility-Administered Medications  Medication Dose Route Frequency Provider Last Rate Last Admin  . acetaminophen (TYLENOL) tablet 650 mg  650 mg Oral Q4H PRN Agbata, Tochukwu, MD      . albuterol (VENTOLIN HFA) 108 (90 Base) MCG/ACT inhaler 2 puff  2 puff Inhalation Q4H PRN Agbata, Tochukwu, MD       . aspirin EC tablet 81 mg  81 mg Oral Daily Agbata, Tochukwu, MD   81 mg at 09/20/20 1226  . atorvastatin (LIPITOR) tablet 80 mg  80 mg Oral QPM Agbata, Tochukwu, MD   80 mg at 09/20/20 1638  . isosorbide mononitrate (IMDUR) 24 hr tablet 30 mg  30 mg Oral Daily Callwood, Dwayne D, MD      . lisinopril (ZESTRIL) tablet 5 mg  5 mg Oral Daily Agbata, Tochukwu, MD   5 mg at 09/21/20 0845  . metoprolol tartrate (LOPRESSOR) tablet 12.5 mg  12.5 mg Oral BID Agbata, Tochukwu, MD   12.5 mg at 09/21/20 0845  . mometasone-formoterol (DULERA) 200-5 MCG/ACT inhaler 2 puff  2 puff Inhalation BID Agbata, Tochukwu, MD      . morphine 2 MG/ML injection 2 mg  2 mg Intravenous Q4H PRN Agbata, Tochukwu, MD   2 mg at 09/20/20 2110  . nitroGLYCERIN (NITROSTAT) SL tablet 0.4 mg  0.4 mg Sublingual Q5 min PRN Agbata, Tochukwu, MD      . ondansetron (ZOFRAN) injection 4 mg  4 mg Intravenous Q6H PRN Agbata, Tochukwu, MD      . pantoprazole (PROTONIX) EC tablet 40 mg  40 mg Oral Daily Callwood, Dwayne D, MD      . prasugrel (EFFIENT) tablet 10 mg  10 mg Oral Daily Agbata, Tochukwu, MD   10 mg at 09/21/20 0845    Family History Family History  Problem Relation Age of Onset  . Alcohol  abuse Father   . Alcohol abuse Brother   . Aneurysm Brother       Social History Social History   Tobacco Use  . Smoking status: Former Smoker    Packs/day: 0.50    Years: 47.00    Pack years: 23.50    Types: Cigarettes    Quit date: 07/28/2020    Years since quitting: 0.1  . Smokeless tobacco: Never Used  . Tobacco comment: 08/24/20 given quit smoking paperwork and encouraged continued cessation  Vaping Use  . Vaping Use: Never used  Substance Use Topics  . Alcohol use: Not Currently  . Drug use: Never        ROS Constitutional:   No fever or chills.  ENT:   No sore throat. No rhinorrhea. Cardiovascular:   No chest pain or syncope. Respiratory:   No dyspnea or cough. Gastrointestinal:   Positive as above epigastric  pain without vomiting and diarrhea.  Musculoskeletal:   Negative for focal pain or swelling All other systems reviewed and are negative except as documented above in ROS and HPI.  Physical Exam Blood pressure 127/81, pulse 62, temperature 99.3 F (37.4 C), temperature source Oral, resp. rate 17, height 5\' 7"  (1.702 m), weight 69 kg, SpO2 98 %. Last Weight  Most recent update: 09/21/2020  3:20 AM   Weight  69 kg (152 lb 3.2 oz)            CONSTITUTIONAL: Well developed, and nourished, appropriately responsive and aware without distress.   EYES: Sclera non-icteric.   EARS, NOSE, MOUTH AND THROAT: Mask worn.   The oropharynx is clear. Oral mucosa is pink and moist.    Hearing is intact to voice.  NECK: Trachea is midline, and there is no jugular venous distension.  LYMPH NODES:  Lymph nodes in the neck are not enlarged. RESPIRATORY:  Lungs are clear, and breath sounds are equal bilaterally. Normal respiratory effort without pathologic use of accessory muscles. CARDIOVASCULAR: Heart is regular in rate and rhythm. GI: The abdomen is moderately tender in the right upper quadrant, otherwise soft, nontender, and nondistended. There were no palpable masses. I did not appreciate hepatosplenomegaly. There were normal bowel sounds.  MUSCULOSKELETAL:  Symmetrical muscle tone appreciated in all four extremities.    SKIN: Skin turgor is normal. No pathologic skin lesions appreciated.  NEUROLOGIC:  Motor and sensation appear grossly normal.  Cranial nerves are grossly without defect. PSYCH:  Alert and oriented to person, place and time. Affect is appropriate for situation.  Data Reviewed I have personally reviewed what is currently available of the patient's imaging, recent labs and medical records.   Labs:  CBC Latest Ref Rng & Units 09/21/2020 09/20/2020 08/10/2020  WBC 4.0 - 10.5 K/uL 10.9(H) 11.0(H) 10.9(H)  Hemoglobin 13.0 - 17.0 g/dL 08/12/2020 41.6 60.6  Hematocrit 39.0 - 52.0 % 43.8 40.3 43.9   Platelets 150 - 400 K/uL 219 221 424   CMP Latest Ref Rng & Units 09/21/2020 09/20/2020 08/10/2020  Glucose 70 - 99 mg/dL 08/12/2020) 601(U) 77  BUN 8 - 23 mg/dL 8 15 11   Creatinine 0.61 - 1.24 mg/dL 932(T 5.57)  Sodium 135 - 145 mmol/L 136 142 137  Potassium 3.5 - 5.1 mmol/L 4.0 4.0 4.7  Chloride 98 - 111 mmol/L 101 108 99  CO2 22 - 32 mmol/L 26 27 23   Calcium 8.9 - 10.3 mg/dL 9.0 9.5 9.7  Total Protein 6.5 - 8.1 g/dL - 6.9 -  Total Bilirubin 0.3 - 1.2  mg/dL - 1.0 -  Alkaline Phos 38 - 126 U/L - 74 -  AST 15 - 41 U/L - 38 -  ALT 0 - 44 U/L - 54(H) -      Imaging: Radiology review:  Within last 24 hrs: NM Hepato W/EF  Result Date: 09/21/2020 CLINICAL DATA:  Cholelithiasis. Please perform nuclear medicine HIDA scan to evaluate for cystic duct occlusion. EXAM: NUCLEAR MEDICINE HEPATOBILIARY IMAGING TECHNIQUE: Sequential images of the abdomen were obtained out to 60 minutes following intravenous administration of radiopharmaceutical. RADIOPHARMACEUTICALS:  4.7 mCi Tc-64m  Choletec IV MEDICATIONS: Morphine 2.9 mg IV, administered for HIDA scan augmentation given nonvisualization of the gallbladder after 1 hour of imaging. COMPARISON:  Right upper quadrant abdominal ultrasound-09/20/2020; CT abdomen pelvis-09/20/2020 FINDINGS: There is homogeneous distribution of injected radiotracer throughout the hepatic parenchyma. There is early excretion of radiotracer with opacification of the proximal small bowel, initially seen on the 15 minute anterior projection planar image. Despite imaging for 1 hour, there is nonvisualization of the gallbladder and as such the examination was augmented with the administration of intravenous morphine. Despite the administration of morphine and imaging for an additional 30 minutes, there is continued nonvisualization of the gallbladder, findings suggestive of cystic duct occlusion. IMPRESSION: Nonvisualization of the gallbladder, despite examination augmentation with  morphine, findings suggestive of cystic duct occlusion/acute cholecystitis. These results will be called to the ordering clinician or representative by the Radiologist Assistant, and communication documented in the PACS or [employer]. Electronically Signed   By: Simonne Come M.D.   On: 09/21/2020 15:10    Assessment    Subacute cholecystitis.  Patient Active Problem List   Diagnosis Date Noted  . Cholecystitis 09/21/2020  . Unstable angina (HCC) 09/20/2020  . CAD, multiple vessel 09/20/2020  . Ischemic cardiomyopathy 09/20/2020  . STEMI involving right coronary artery (HCC) 07/28/2020  . HLD (hyperlipidemia) 07/28/2020  . Leukocytosis 07/28/2020  . Chronic active hepatitis (HCC) 03/11/2019  . Erectile dysfunction 02/04/2019  . COPD (chronic obstructive pulmonary disease) (HCC) 02/04/2019  . Nicotine dependence, cigarettes, w unsp disorders 02/04/2019  . White coat syndrome without diagnosis of hypertension 02/04/2019    Plan    After discussion with Dr. Morley Kos of anesthesia, feel that elective surgery requiring general anesthesia within 3 months of a myocardial infarction and is an excess risk. This risk can be avoided due to the local anesthetic available for percutaneous cholecystostomy. I discussed with the patient that this would buy him time, and diminish his anesthetic risk. I believe he understands, we anticipate percutaneous cholecystostomy tomorrow under CT guidance.  We will gladly follow him after discharge, and prepare him for eventual definitive cholecystectomy.  Face-to-face time spent with the patient and accompanying care providers(if present) was 45 minutes, with more than 50% of the time spent counseling, educating, and coordinating care of the patient.      [name] M.D., FACS 09/21/2020, 4:38 PM

## 2020-09-22 ENCOUNTER — Inpatient Hospital Stay: Payer: Medicare Other

## 2020-09-22 DIAGNOSIS — K810 Acute cholecystitis: Secondary | ICD-10-CM | POA: Diagnosis not present

## 2020-09-22 DIAGNOSIS — I252 Old myocardial infarction: Secondary | ICD-10-CM | POA: Diagnosis not present

## 2020-09-22 DIAGNOSIS — I200 Unstable angina: Secondary | ICD-10-CM | POA: Diagnosis not present

## 2020-09-22 LAB — CBC
HCT: 44 % (ref 39.0–52.0)
Hemoglobin: 14.8 g/dL (ref 13.0–17.0)
MCH: 33.3 pg (ref 26.0–34.0)
MCHC: 33.6 g/dL (ref 30.0–36.0)
MCV: 98.9 fL (ref 80.0–100.0)
Platelets: 191 10*3/uL (ref 150–400)
RBC: 4.45 MIL/uL (ref 4.22–5.81)
RDW: 12.5 % (ref 11.5–15.5)
WBC: 9.6 10*3/uL (ref 4.0–10.5)
nRBC: 0 % (ref 0.0–0.2)

## 2020-09-22 MED ORDER — FENTANYL CITRATE (PF) 100 MCG/2ML IJ SOLN
INTRAMUSCULAR | Status: AC
  Filled 2020-09-22: qty 2

## 2020-09-22 MED ORDER — MIDAZOLAM HCL 2 MG/2ML IJ SOLN
INTRAMUSCULAR | Status: DC | PRN
  Administered 2020-09-22 (×2): 1 mg via INTRAVENOUS

## 2020-09-22 MED ORDER — SODIUM CHLORIDE 0.9% FLUSH
5.0000 mL | Freq: Three times a day (TID) | INTRAVENOUS | Status: DC
  Administered 2020-09-22 – 2020-09-23 (×3): 5 mL

## 2020-09-22 MED ORDER — MIDAZOLAM HCL 2 MG/2ML IJ SOLN
INTRAMUSCULAR | Status: AC
  Filled 2020-09-22: qty 2

## 2020-09-22 MED ORDER — FENTANYL CITRATE (PF) 100 MCG/2ML IJ SOLN
INTRAMUSCULAR | Status: DC | PRN
Start: 2020-09-22 — End: 2020-09-23
  Administered 2020-09-22 (×2): 50 ug via INTRAVENOUS

## 2020-09-22 NOTE — Procedures (Signed)
Interventional Radiology Procedure Note  Procedure: CT PERC CHOLECYSTOSTOMY    Complications: None  Estimated Blood Loss:  0  Findings: MILDLY EXUDATIVE BILE CX SENT    Sharen Counter, MD

## 2020-09-22 NOTE — Progress Notes (Signed)
PROGRESS NOTE    Andrew Potts  HDQ:[id number] DOB: 1950-09-30 DOA: 09/20/2020 PCP: [name], NP   Brief Narrative: Taken from H&P. Andrew Potts is a 70 y.o. male with medical history significant for multivessel coronary artery disease status post recent PCI with stent angioplasty to the RCA following a non-ST elevation MI, ischemic cardiomyopathy with last known LVEF of 40 to 45%, COPD who presents to the emergency room via EMS for evaluation of sudden onset central chest pain mostly in the lower sternal area.  Symptoms did not resolved completely with multiple nitroglycerin.  CTA was negative for PE and a questionable gallbladder wall inflammation, right upper quadrant ultrasound with some bladder wall thickening and recommending nuclear medicine studies, HIDA scan with some concern of cystic duct/cholecystitis. EKG without any acute abnormality and troponin barely positive, cardiology cleared him from cardiac standpoint. Surgery was consulted for concern of HIDA scan findings-planning to put a percutaneous drain tomorrow as they will try to postpone surgery till April secondary to recent PCI and MI.  Subjective: Patient was seen before getting his cholecystostomy drain placed this morning.  He was anxious about the procedure.  Continued to have mild right upper quadrant and epigastric discomfort.  Denies any nausea or vomiting.  Able to tolerate diet.  Assessment & Plan:   Principal Problem:   Unstable angina (HCC) Active Problems:   COPD (chronic obstructive pulmonary disease) (HCC)   CAD, multiple vessel   Ischemic cardiomyopathy   Cholecystitis  Symptomatic choledocholithiasis/cholecystitis.  No obvious obstructing stone but HIDA scan concerning for cystic duct/gallbladder inflammation. General surgery was consulted and they are recommending gallbladder drain placement  as they will try to postpone surgery till April secondary to his recent MI and PCI. -Going for  cholecystostomy drain placement by IR later today. -Continue with pain management. -No need for antibiotics at this time as leukocytosis is improving and he remained afebrile, waiting for culture results from cholecystostomy.  Unstable angina.  He was initially admitted with concern of unstable angina due to his extensive cardiac history and recent NSTEMI s/p PCI. Cardiology cleared him from cardiac standpoint. -Continue home dose of aspirin, Plavix, statin and metoprolol.  History of ischemic cardiomyopathy with HFrEF.  Appears euvolemic -Continue home dose of metoprolol and lisinopril  COPD.  No acute concern -Continue with as needed bronchodilators  Objective: Vitals:   09/22/20 1422 09/22/20 1425 09/22/20 1445 09/22/20 1451  BP: (!) 90/56 92/66 (!) 100/54   Pulse: 73 82 77   Resp: 17 (!) 21 (!) 22 18  Temp:      TempSrc:      SpO2: 97% 98% 98%   Weight:      Height:        Intake/Output Summary (Last 24 hours) at 09/22/2020 1605 Last data filed at 09/22/2020 1233 Gross per 24 hour  Intake 300 ml  Output 1225 ml  Net -925 ml   Filed Weights   09/20/20 0210 09/21/20 0320 09/22/20 0349  Weight: 72.1 kg 69 kg 69.3 kg    Examination:  General.  Well-developed gentleman, in no acute distress. Pulmonary.  Lungs clear bilaterally, normal respiratory effort. CV.  Regular rate and rhythm, no JVD, rub or murmur. Abdomen.  Soft, nontender, nondistended, BS positive. CNS.  Alert and oriented x3.  No focal neurologic deficit. Extremities.  No edema, no cyanosis, pulses intact and symmetrical. Psychiatry.  Judgment and insight appears normal.  DVT prophylaxis: Heparin Code Status: Full Family Communication: Discussed with patient Disposition  Plan:  Status is: Inpatient  Remains inpatient appropriate because:Inpatient level of care appropriate due to severity of illness   Dispo: The patient is from: Home              Anticipated d/c is to: Home              Patient  currently is not medically stable to d/c.   Difficult to place patient No               Level of care: Med-Surg  All the records are reviewed and case discussed with Care Management/Social Worker. Management plans discussed with the patient, nursing and they are in agreement.  Consultants:   Cardiology  General surgery  Procedures:  Antimicrobials:   Data Reviewed: I have personally reviewed following labs and imaging studies  CBC: Recent Labs  Lab 09/20/20 0209 09/21/20 0437 09/22/20 0431  WBC 11.0* 10.9* 9.6  HGB 13.8 15.0 14.8  HCT 40.3 43.8 44.0  MCV 97.1 97.3 98.9  PLT 221 219 191   Basic Metabolic Panel: Recent Labs  Lab 09/20/20 0209 09/21/20 0437  NA 142 136  K 4.0 4.0  CL 108 101  CO2 27 26  GLUCOSE 150* 103*  BUN 15 8  CREATININE 0.70 0.62  CALCIUM 9.5 9.0   GFR: Estimated Creatinine Clearance: 83.8 mL/min (by C-G formula based on SCr of 0.62 mg/dL). Liver Function Tests: Recent Labs  Lab 09/20/20 0209  AST 38  ALT 54*  ALKPHOS 74  BILITOT 1.0  PROT 6.9  ALBUMIN 3.8   Recent Labs  Lab 09/20/20 0209  LIPASE 34   No results for input(s): AMMONIA in the last 168 hours. Coagulation Profile: Recent Labs  Lab 09/20/20 0209  INR 1.1   Cardiac Enzymes: No results for input(s): CKTOTAL, CKMB, CKMBINDEX, TROPONINI in the last 168 hours. BNP (last 3 results) No results for input(s): PROBNP in the last 8760 hours. HbA1C: No results for input(s): HGBA1C in the last 72 hours. CBG: No results for input(s): GLUCAP in the last 168 hours. Lipid Profile: Recent Labs    09/21/20 0437  CHOL 105  HDL 40*  LDLCALC 54  TRIG 56  CHOLHDL 2.6   Thyroid Function Tests: No results for input(s): TSH, T4TOTAL, FREET4, T3FREE, THYROIDAB in the last 72 hours. Anemia Panel: No results for input(s): VITAMINB12, FOLATE, FERRITIN, TIBC, IRON, RETICCTPCT in the last 72 hours. Sepsis Labs: No results for input(s): PROCALCITON, LATICACIDVEN in the last  168 hours.  Recent Results (from the past 240 hour(s))  SARS CORONAVIRUS 2 (TAT 6-24 HRS) Nasopharyngeal Nasopharyngeal Swab     Status: None   Collection Time: 09/20/20  7:06 AM   Specimen: Nasopharyngeal Swab  Result Value Ref Range Status   SARS Coronavirus 2 NEGATIVE NEGATIVE Final    Comment: (NOTE) SARS-CoV-2 target nucleic acids are NOT DETECTED.  The SARS-CoV-2 RNA is generally detectable in upper and lower respiratory specimens during the acute phase of infection. Negative results do not preclude SARS-CoV-2 infection, do not rule out co-infections with other pathogens, and should not be used as the sole basis for treatment or other patient management decisions. Negative results must be combined with clinical observations, patient history, and epidemiological information. The expected result is Negative.  Fact Sheet for Patients: HairSlick.no  Fact Sheet for Healthcare Providers: quierodirigir.com  This test is not yet approved or cleared by the [place] FDA and  has been authorized for detection and/or diagnosis of SARS-CoV-2 by FDA  under an Emergency Use Authorization (EUA). This EUA will remain  in effect (meaning this test can be used) for the duration of the COVID-19 declaration under Se ction 564(b)(1) of the Act, 21 U.S.C. section 360bbb-3(b)(1), unless the authorization is terminated or revoked sooner.  Performed at [facility], [address] [place], [place] [address]      Radiology Studies: NM Hepato W/EF  Result Date: 09/21/2020 CLINICAL DATA:  Cholelithiasis. Please perform nuclear medicine HIDA scan to evaluate for cystic duct occlusion. EXAM: NUCLEAR MEDICINE HEPATOBILIARY IMAGING TECHNIQUE: Sequential images of the abdomen were obtained out to 60 minutes following intravenous administration of radiopharmaceutical. RADIOPHARMACEUTICALS:  4.7 mCi Tc-26m  Choletec IV MEDICATIONS: Morphine  2.9 mg IV, administered for HIDA scan augmentation given nonvisualization of the gallbladder after 1 hour of imaging. COMPARISON:  Right upper quadrant abdominal ultrasound-09/20/2020; CT abdomen pelvis-09/20/2020 FINDINGS: There is homogeneous distribution of injected radiotracer throughout the hepatic parenchyma. There is early excretion of radiotracer with opacification of the proximal small bowel, initially seen on the 15 minute anterior projection planar image. Despite imaging for 1 hour, there is nonvisualization of the gallbladder and as such the examination was augmented with the administration of intravenous morphine. Despite the administration of morphine and imaging for an additional 30 minutes, there is continued nonvisualization of the gallbladder, findings suggestive of cystic duct occlusion. IMPRESSION: Nonvisualization of the gallbladder, despite examination augmentation with morphine, findings suggestive of cystic duct occlusion/acute cholecystitis. These results will be called to the ordering clinician or representative by the Radiologist Assistant, and communication documented in the PACS or [employer]. Electronically Signed   By: Simonne Come M.D.   On: 09/21/2020 15:10   CT IMAGE GUIDED DRAINAGE BY PERCUTANEOUS CATHETER  Result Date: 09/22/2020 INDICATION: Acute cholecystitis EXAM: CT-GUIDED PERCUTANEOUS TRANSHEPATIC CHOLECYSTOSTOMY MEDICATIONS: The patient is currently admitted to the hospital and receiving intravenous antibiotics. The antibiotics were administered within an appropriate time frame prior to the initiation of the procedure. ANESTHESIA/SEDATION: Fentanyl 100 mcg IV; Versed 2.0 mg IV Moderate Sedation Time:  10 MINUTES The patient was continuously monitored during the procedure by the interventional radiology nurse under my direct supervision. COMPLICATIONS: None immediate. PROCEDURE: Informed written consent was obtained from the patient after a thorough discussion of the  procedural risks, benefits and alternatives. All questions were addressed. Maximal Sterile Barrier Technique was utilized including caps, mask, sterile gowns, sterile gloves, sterile drape, hand hygiene and skin antiseptic. A timeout was performed prior to the initiation of the procedure. Previous imaging reviewed. Patient positioned supine. Noncontrast localization CT performed. The gallbladder was localized in the right subcostal area for a transhepatic approach. Under sterile conditions and local anesthesia, an 18 gauge 10 cm access was advanced from a percutaneous transhepatic approach into the gallbladder. Needle position confirmed with CT. Guidewire inserted followed by tract dilatation to insert a 10 French catheter. Retention loop formed in the gallbladder. Position confirmed with CT. Patient tolerated the procedure well.  No immediate complication. IMPRESSION: Successful CT-guided percutaneous transhepatic cholecystostomy Electronically Signed   By: Judie Petit.  Shick M.D.   On: 09/22/2020 14:38    Scheduled Meds:  aspirin EC  81 mg Oral Daily   atorvastatin  80 mg Oral QPM   fentaNYL       isosorbide mononitrate  30 mg Oral Daily   lisinopril  5 mg Oral Daily   metoprolol tartrate  12.5 mg Oral BID   midazolam       mometasone-formoterol  2 puff Inhalation BID  pantoprazole  40 mg Oral Daily   prasugrel  10 mg Oral Daily   sodium chloride flush  5 mL Intracatheter Q8H   Continuous Infusions:   LOS: 1 day   Time spent: 30 minutes. More than 50% of the time was spent in counseling/coordination of care  Arnetha Courser, MD [facility]  If 7PM-7AM, please contact night-coverage [email or web address]  09/22/2020, 4:05 PM   This record has been created using Conservation officer, historic buildings. Errors have been sought and corrected,but may not always be located. Such creation errors do not reflect on the standard of care.

## 2020-09-22 NOTE — Progress Notes (Signed)
[facility] SURGICAL PROGRESS NOTE (cpt [phone])  Hospital Day(s): 1.   Interval History: Patient seen and examined, no acute events or new complaints overnight. Patient reports he still has some mild epigastric and RUQ abdominal pain, but he denies fever, chills, cough, CP, SOB, nausea, or emesis. He is without leukocytosis this morning to 9.6K. He was tolerating diet yesterday without issues. NPO this morning. Again, HIDA concerning for subacute cholecystitis. Plan for percutaneous cholecystostomy tube with IR today given recent MI (<3 months) and cardiac history.   Review of Systems:  Constitutional: denies fever, chills  HEENT: denies cough or congestion  Respiratory: denies any shortness of breath  Cardiovascular: denies chest pain or palpitations  Gastrointestinal: + abdominal pain, denied N/V, or diarrhea/and bowel function as per interval history Genitourinary: denies burning with urination or urinary frequency  Vital signs in last 24 hours: [min-max] current  Temp:  [97.9 F (36.6 C)-99.3 F (37.4 C)] 98 F (36.7 C) (03/10 0349) Pulse Rate:  [59-69] 61 (03/10 0425) Resp:  [17-18] 18 (03/10 0349) BP: (104-127)/(65-81) 104/65 (03/10 0349) SpO2:  [92 %-98 %] 95 % (03/10 0349) Weight:  [69.3 kg] 69.3 kg (03/10 0349)     Height: 5\' 7"  (170.2 cm) Weight: 69.3 kg BMI (Calculated): 23.92   Intake/Output last 2 shifts:  03/09 0701 - 03/10 0700 In: 300 [P.O.:300] Out: 1475 [Urine:1475]   Physical Exam:  Constitutional: alert, cooperative and no distress  HENT: normocephalic without obvious abnormality  Eyes: PERRL, EOM's grossly intact and symmetric  Respiratory: breathing non-labored at rest  Cardiovascular: regular rate and sinus rhythm  Gastrointestinal: Soft, epigastric/RUQ tenderness, and non-distended, no rebound/guarding. Musculoskeletal: No edema or wounds, motor and sensation grossly intact, NT    Labs:  CBC Latest Ref Rng & Units 09/22/2020 09/21/2020  09/20/2020  WBC 4.0 - 10.5 K/uL 9.6 10.9(H) 11.0(H)  Hemoglobin 13.0 - 17.0 g/dL 11/20/2020 50.5 39.7  Hematocrit 39.0 - 52.0 % 44.0 43.8 40.3  Platelets 150 - 400 K/uL 191 219 221   CMP Latest Ref Rng & Units 09/21/2020 09/20/2020 08/10/2020  Glucose 70 - 99 mg/dL 08/12/2020) 419(F) 77  BUN 8 - 23 mg/dL 8 15 11   Creatinine 0.61 - 1.24 mg/dL 790(W 4.09)  Sodium 135 - 145 mmol/L 136 142 137  Potassium 3.5 - 5.1 mmol/L 4.0 4.0 4.7  Chloride 98 - 111 mmol/L 101 108 99  CO2 22 - 32 mmol/L 26 27 23   Calcium 8.9 - 10.3 mg/dL 9.0 9.5 9.7  Total Protein 6.5 - 8.1 g/dL - 6.9 -  Total Bilirubin 0.3 - 1.2 mg/dL - 1.0 -  Alkaline Phos 38 - 126 U/L - 74 -  AST 15 - 41 U/L - 38 -  ALT 0 - 44 U/L - 54(H) -     Imaging studies: No new pertinent imaging studies   Assessment/Plan: (ICD-10's: K81.0) 70 y.o. male with subacute cholecystitis, complicated by pertinent comorbidities including recent MI (<3 months).   - Will plan for IR placement of percutaneous cholecystostomy tube this today  - Again, given his recent MI, he is at excessive anesthesia risk and not optimal candidate for surgery. We will temporize with cholecystostomy tube to allow for outpatient optimization prior to interval cholecystectomy.   - NPO for planned procedure; okay to resume after if doing well  - Monitor abdominal examination; on-going bowel function  - Pain control prn; antiemetics prn   - Mobilization as tolerated  - Further management per primary service  All of the above findings and recommendations were discussed with the patient, and the medical team, and all of patient's questions were answered to his expressed satisfaction.  -- Lynden Oxford, PA-C [facility] 09/22/2020, 7:02 AM [phone] M-F: 7am - 4pm

## 2020-09-22 NOTE — Progress Notes (Signed)
Patient clinically stable post Chole tube placement,per Dr Miles Costain, tolerated well. Vitals stable pre and post procedure. Denies complaints at this time.awake./alert and oriented post procedure. Received Versed 2 mg along with Fentanyl 100 mcg IV for procedure. Report given to care nurse at bedside post procedure./recovery.

## 2020-09-23 DIAGNOSIS — K810 Acute cholecystitis: Secondary | ICD-10-CM | POA: Diagnosis not present

## 2020-09-23 DIAGNOSIS — I200 Unstable angina: Secondary | ICD-10-CM | POA: Diagnosis not present

## 2020-09-23 DIAGNOSIS — I252 Old myocardial infarction: Secondary | ICD-10-CM | POA: Diagnosis not present

## 2020-09-23 MED ORDER — ISOSORBIDE MONONITRATE ER 30 MG PO TB24: 30.0000 mg | ORAL_TABLET | Freq: Every day | ORAL | 0 refills | Status: DC

## 2020-09-23 MED ORDER — PANTOPRAZOLE SODIUM 40 MG PO TBEC: 40.0000 mg | DELAYED_RELEASE_TABLET | Freq: Every day | ORAL | 0 refills | Status: DC

## 2020-09-23 NOTE — Care Management Important Message (Signed)
Important Message  Patient Details  Name: Andrew Potts MRN: [id number] Date of Birth: February 03, 1951   Medicare Important Message Given:  Yes     Johnell Comings 09/23/2020, 11:17 AM

## 2020-09-23 NOTE — Discharge Instructions (Signed)
a 

## 2020-09-23 NOTE — Progress Notes (Signed)
[facility] SURGICAL PROGRESS NOTE (cpt [phone])  Hospital Day(s): 2.   Interval History: Patient seen and examined, no acute events or new complaints overnight. Patient reports he is feeling better this morning, minimal if any abdominal pain. He denies fever, chills, nausea, emesis. No new labs or imaging this morning. He did undergo percutaneous cholecystostomy tube placement yesterday (03/10) with IR. Cx from this are pending. Output from this has been 150 ccs; serosanguinous. He was advanced to heart healthy diet and tolerating this well. No other complaints.   Review of Systems:  Constitutional: denies fever, chills  HEENT: denies cough or congestion  Respiratory: denies any shortness of breath  Cardiovascular: denies chest pain or palpitations  Gastrointestinal: denies abdominal pain, N/V, or diarrhea/and bowel function as per interval history Genitourinary: denies burning with urination or urinary frequency  Vital signs in last 24 hours: [min-max] current  Temp:  [98.2 F (36.8 C)-98.4 F (36.9 C)] 98.2 F (36.8 C) (03/11 0410) Pulse Rate:  [57-82] 57 (03/11 0410) Resp:  [11-22] 18 (03/10 2216) BP: (90-124)/(54-82) 124/82 (03/11 0410) SpO2:  [94 %-99 %] 94 % (03/11 0410) Weight:  [69.1 kg] 69.1 kg (03/11 0038)     Height: 5\' 7"  (170.2 cm) Weight: 69.1 kg BMI (Calculated): 23.86   Intake/Output last 2 shifts:  03/10 0701 - 03/11 0700 In: 25 [I.V.:10] Out: 1700 [Urine:1550; Drains:150]   Physical Exam:  Constitutional: alert, cooperative and no distress  HENT: normocephalic without obvious abnormality  Eyes: PERRL, EOM's grossly intact and symmetric  Respiratory: breathing non-labored at rest  Cardiovascular: regular rate and sinus rhythm  Gastrointestinal: Soft, non-tender, and non-distended, no rebound/guarding. Cholecystostomy tube in place with serosanguinous output Musculoskeletal: UE and LE FROM, no edema or wounds, motor and sensation grossly intact,  NT    Labs:  CBC Latest Ref Rng & Units 09/22/2020 09/21/2020 09/20/2020  WBC 4.0 - 10.5 K/uL 9.6 10.9(H) 11.0(H)  Hemoglobin 13.0 - 17.0 g/dL 11/20/2020 97.6 73.4  Hematocrit 39.0 - 52.0 % 44.0 43.8 40.3  Platelets 150 - 400 K/uL 191 219 221   CMP Latest Ref Rng & Units 09/21/2020 09/20/2020 08/10/2020  Glucose 70 - 99 mg/dL 08/12/2020) 790(W) 77  BUN 8 - 23 mg/dL 8 15 11   Creatinine 0.61 - 1.24 mg/dL 409(B 3.53)  Sodium 135 - 145 mmol/L 136 142 137  Potassium 3.5 - 5.1 mmol/L 4.0 4.0 4.7  Chloride 98 - 111 mmol/L 101 108 99  CO2 22 - 32 mmol/L 26 27 23   Calcium 8.9 - 10.3 mg/dL 9.0 9.5 9.7  Total Protein 6.5 - 8.1 g/dL - 6.9 -  Total Bilirubin 0.3 - 1.2 mg/dL - 1.0 -  Alkaline Phos 38 - 126 U/L - 74 -  AST 15 - 41 U/L - 38 -  ALT 0 - 44 U/L - 54(H) -     Imaging studies: No new pertinent imaging studies   Assessment/Plan: (ICD-10's: K81.0) 70 y.o. male with subacute cholecystitis s/p percutaneous cholecystostomy (03/10), complicated by pertinent comorbidities including recent MI (<3 months).   - Maintain percutaneous drain; monitor and record output; will provide discharge instructions for this  - Agree heart healthy diet  - Again, given his recent MI, he is at excessive anesthesia risk and not optimal candidate for surgery  - Monitor abdominal examination; on-going bowel function             - Pain control prn; antiemetics prn              -  Mobilization as tolerated             - Further management per primary service    All of the above findings and recommendations were discussed with the patient, and the medical team, and all of patient's questions were answered to his expressed satisfaction.  -- Lynden Oxford, PA-C [facility] 09/23/2020, 7:10 AM [phone] M-F: 7am - 4pm

## 2020-09-23 NOTE — Discharge Summary (Signed)
Physician Discharge Summary  BODIN GORKA ZOX:[id number] DOB: 31-May-1951 DOA: 09/20/2020  PCP: [name], NP  Admit date: 09/20/2020 Discharge date: 09/23/2020  Admitted From: Home Disposition: Home  Recommendations for Outpatient Follow-up:  1. Follow up with PCP in 1-2 weeks 2. Follow-up for surgery 3. Please obtain BMP/CBC in one week 4. Please follow up on the following pending results: None  Home Health: No Equipment/Devices: Percutaneous cholecystostomy drain Discharge Condition: Stable CODE STATUS: Full Diet recommendation: Heart Healthy / Carb Modified   Brief/Interim Summary: Sion L Stuttsis a 70 y.o.malewith medical history significant formultivessel coronary artery disease status post recent PCI with stent angioplasty to the RCA following a non-ST elevation MI, ischemic cardiomyopathy with last known LVEF of 40 to 45%, COPD who presents to the emergency room via EMS for evaluation of sudden onset central chest pain mostly in the lower sternal area.  Symptoms did not resolved completely with multiple nitroglycerin.  CTA was negative for PE and a questionable gallbladder wall inflammation, right upper quadrant ultrasound with some bladder wall thickening and recommending nuclear medicine studies, HIDA scan with some concern of cystic duct/cholecystitis. EKG without any acute abnormality and troponin barely positive, cardiology cleared him from cardiac standpoint. Surgery was consulted for concern of HIDA scan findings and they recommend placing of percutaneous drain with IR in order to postpone surgery due to his history of recent MI with PCI.  Percutaneous drain was placed successfully with good drainage.  Cultures remain negative.  Patient will follow up with surgery as an outpatient for definitive management of his gallbladder disease.  No need for antibiotics at this time.  He will continue with rest of his home medications and follow-up with his  providers.  Discharge Diagnoses:  Principal Problem:   Unstable angina (HCC) Active Problems:   COPD (chronic obstructive pulmonary disease) (HCC)   CAD, multiple vessel   Ischemic cardiomyopathy   Cholecystitis  Discharge Instructions  Discharge Instructions    Diet - low sodium heart healthy   Complete by: As directed    Discharge instructions   Complete by: As directed    It was pleasure taking care of you. Continue taking your medications, your cardiologist added 1 more for your angina. Please follow the directions for your drain care given to you by surgery, follow-up with surgery closely for optimum care of your gallbladder problem.   Increase activity slowly   Complete by: As directed    No wound care   Complete by: As directed      Allergies as of 09/23/2020      Reactions   Codeine    Codone [hydrocodone] Other (See Comments)   Abdominal pain   Levaquin [levofloxacin] Swelling      Medication List    TAKE these medications   aspirin 81 MG EC tablet Take 1 tablet (81 mg total) by mouth daily. Swallow whole.   atorvastatin 80 MG tablet Commonly known as: LIPITOR Take 1 tablet (80 mg total) by mouth daily.   isosorbide mononitrate 30 MG 24 hr tablet Commonly known as: IMDUR Take 1 tablet (30 mg total) by mouth daily.   lisinopril 5 MG tablet Commonly known as: ZESTRIL Take 1 tablet (5 mg total) by mouth daily.   metoprolol tartrate 25 MG tablet Commonly known as: LOPRESSOR Take 0.5 tablets (12.5 mg total) by mouth 2 (two) times daily.   mometasone-formoterol 200-5 MCG/ACT Aero Commonly known as: DULERA Inhale 2 puffs into the lungs in the morning and at  bedtime.   nitroGLYCERIN 0.4 MG SL tablet Commonly known as: NITROSTAT Place 1 tablet (0.4 mg total) under the tongue every 5 (five) minutes as needed for chest pain.   pantoprazole 40 MG tablet Commonly known as: PROTONIX Take 1 tablet (40 mg total) by mouth daily.   prasugrel 10 MG Tabs  tablet Commonly known as: EFFIENT Take 10 mg by mouth daily.   ProAir HFA 108 (90 Base) MCG/ACT inhaler Generic drug: albuterol Inhale 2 puffs into the lungs every 4 (four) hours as needed.   Symbicort 160-4.5 MCG/ACT inhaler Generic drug: budesonide-formoterol Inhale 2 puffs into the lungs 2 (two) times daily.       Follow-up Information    [name], MD. Schedule an appointment as soon as possible for a visit in 3 week(s).   Specialty: General Surgery Why: 2-3 week follow up  Contact information: [address] [place] [address] [phone]        [name], NP. Schedule an appointment as soon as possible for a visit.   Specialty: Nurse Practitioner Contact information: [address] [place] [address] [phone]              Allergies  Allergen Reactions  . Codeine   . Codone [Hydrocodone] Other (See Comments)    Abdominal pain  . Levaquin [Levofloxacin] Swelling    Consultations:  Cardiology  General surgery  Procedures/Studies: NM Hepato W/EF  Result Date: 09/21/2020 CLINICAL DATA:  Cholelithiasis. Please perform nuclear medicine HIDA scan to evaluate for cystic duct occlusion. EXAM: NUCLEAR MEDICINE HEPATOBILIARY IMAGING TECHNIQUE: Sequential images of the abdomen were obtained out to 60 minutes following intravenous administration of radiopharmaceutical. RADIOPHARMACEUTICALS:  4.7 mCi Tc-18m  Choletec IV MEDICATIONS: Morphine 2.9 mg IV, administered for HIDA scan augmentation given nonvisualization of the gallbladder after 1 hour of imaging. COMPARISON:  Right upper quadrant abdominal ultrasound-09/20/2020; CT abdomen pelvis-09/20/2020 FINDINGS: There is homogeneous distribution of injected radiotracer throughout the hepatic parenchyma. There is early excretion of radiotracer with opacification of the proximal small bowel, initially seen on the 15 minute anterior projection planar image. Despite imaging for 1 hour, there  is nonvisualization of the gallbladder and as such the examination was augmented with the administration of intravenous morphine. Despite the administration of morphine and imaging for an additional 30 minutes, there is continued nonvisualization of the gallbladder, findings suggestive of cystic duct occlusion. IMPRESSION: Nonvisualization of the gallbladder, despite examination augmentation with morphine, findings suggestive of cystic duct occlusion/acute cholecystitis. These results will be called to the ordering clinician or representative by the Radiologist Assistant, and communication documented in the PACS or [employer]. Electronically Signed   By: Simonne Come M.D.   On: 09/21/2020 15:10   DG Chest Port 1 View  Result Date: 09/20/2020 CLINICAL DATA:  Chest pain EXAM: PORTABLE CHEST 1 VIEW COMPARISON:  07/28/2020 FINDINGS: The heart size and mediastinal contours are within normal limits. Both lungs are clear. The visualized skeletal structures are unremarkable. IMPRESSION: No active disease. Electronically Signed   By: Deatra Robinson M.D.   On: 09/20/2020 02:39   CT Angio Chest/Abd/Pel for Dissection W and/or Wo Contrast  Result Date: 09/20/2020 CLINICAL DATA:  70 year old male status post STEMI in January. Epigastric pain. EXAM: CT ANGIOGRAPHY CHEST, ABDOMEN AND PELVIS TECHNIQUE: Noncontrast CT images of the chest provided, followed by Multidetector CT imaging through the chest, abdomen and pelvis during bolus administration of intravenous contrast. Multiplanar reconstructed images and MIPs were obtained and reviewed to evaluate  the vascular anatomy. CONTRAST:  OMNIPAQUE IOHEXOL 350 MG/ML SOLN COMPARISON:  Portable chest x-ray earlier today. Report of chest CT 11/12/2002 (no images available). FINDINGS: CTA CHEST FINDINGS Cardiovascular: Calcified coronary artery atherosclerosis (series 16, image 35). No cardiomegaly or pericardial effusion. Thoracic aorta is negative aside from  atherosclerosis. Mediastinum/Nodes: Negative.  No lymphadenopathy. Lungs/Pleura: Major airways are patent. Minor chronic scarring in the lungs, greater on the left and with some association to chronic rib fractures on that side. No pleural effusion or other abnormal pulmonary opacity. Musculoskeletal: Numerous left anterolateral rib deformities, present in January but some incompletely united (series 10, image 65). Similar anterior right rib fractures (3rd through 6th, such as series 10, image 75). No acute osseous abnormality identified. Review of the MIP images confirms the above findings. CTA ABDOMEN AND PELVIS FINDINGS VASCULAR Extensive Aortoiliac calcified atherosclerosis. Negative for dissection or aneurysm. Major arterial structures remain patent despite the atherosclerosis. Review of the MIP images confirms the above findings. NON-VASCULAR Hepatobiliary: Small round circumscribed low-density area in the right lobe measures 13 mm and is most likely a benign cyst or hemangioma. Heterogeneous hepatic steatosis suspected in the background liver, with some sparing including at the gallbladder fossa. No suspicious liver lesion. The gallbladder wall does appear mildly indistinct on coronal image 55, but there is no convincing pericholecystic inflammation. No bile duct enlargement. Pancreas: Negative. Spleen: Negative. Adrenals/Urinary Tract: Negative adrenal glands. Contrast is being excreted from both kidneys and ureters which appear normal. Some contrast also in the urinary bladder which is unremarkable (ureteral jets). Stomach/Bowel: No dilated large or small bowel. Extensive diverticulosis in the descending colon, sigmoid colon, and to a lesser extent at the hepatic flexure. Negative appendix on series 9, image 178. No large bowel inflammation. Gastric fundus somewhat distended with food but the distal stomach is decompressed. 3 cm midline duodenum diverticulum on series 9, image 137 with no active  inflammation. No free air, free fluid, or mesenteric stranding identified. Lymphatic: No lymphadenopathy. Reproductive: Negative. Other: No pelvic free fluid. Musculoskeletal: No acute osseous abnormality identified. Review of the MIP images confirms the above findings. IMPRESSION: 1. Negative for aortic dissection or aneurysm. Extensive Aortic Atherosclerosis (ICD10-I70.0). 2. Questionable gallbladder wall inflammation. Consider follow-up Right Upper Quadrant Ultrasound in this clinical setting. 3. Otherwise no acute or inflammatory process identified in the chest, abdomen, or pelvis. Extensive bilateral rib fractures, some of which remain incompletely healed. Heterogeneous hepatic steatosis suspected, along with small benign cyst or hemangioma in the right lobe of the liver. Multifocal diverticulosis of the colon without active inflammation. Duodenal diverticulum. Electronically Signed   By: Odessa Fleming M.D.   On: 09/20/2020 07:33   CT IMAGE GUIDED DRAINAGE BY PERCUTANEOUS CATHETER  Result Date: 09/22/2020 INDICATION: Acute cholecystitis EXAM: CT-GUIDED PERCUTANEOUS TRANSHEPATIC CHOLECYSTOSTOMY MEDICATIONS: The patient is currently admitted to the hospital and receiving intravenous antibiotics. The antibiotics were administered within an appropriate time frame prior to the initiation of the procedure. ANESTHESIA/SEDATION: Fentanyl 100 mcg IV; Versed 2.0 mg IV Moderate Sedation Time:  10 MINUTES The patient was continuously monitored during the procedure by the interventional radiology nurse under my direct supervision. COMPLICATIONS: None immediate. PROCEDURE: Informed written consent was obtained from the patient after a thorough discussion of the procedural risks, benefits and alternatives. All questions were addressed. Maximal Sterile Barrier Technique was utilized including caps, mask, sterile gowns, sterile gloves, sterile drape, hand hygiene and skin antiseptic. A timeout was performed prior to the  initiation of the procedure. Previous imaging reviewed. Patient  positioned supine. Noncontrast localization CT performed. The gallbladder was localized in the right subcostal area for a transhepatic approach. Under sterile conditions and local anesthesia, an 18 gauge 10 cm access was advanced from a percutaneous transhepatic approach into the gallbladder. Needle position confirmed with CT. Guidewire inserted followed by tract dilatation to insert a 10 French catheter. Retention loop formed in the gallbladder. Position confirmed with CT. Patient tolerated the procedure well.  No immediate complication. IMPRESSION: Successful CT-guided percutaneous transhepatic cholecystostomy Electronically Signed   By: Judie Petit.  Shick M.D.   On: 09/22/2020 14:38   US Abdomen Limited RUQ (LIVER/GB)  Result Date: 09/20/2020 CLINICAL DATA:  Epigastric pain beginning last night. EXAM: ULTRASOUND ABDOMEN LIMITED RIGHT UPPER QUADRANT COMPARISON:  CT same day FINDINGS: Gallbladder: Gallbladder wall thickening with some surrounding fluid. No Murphy sign. No visible gallstone. No sludge. Acalculous cholecystitis not excluded. Consider nuclear medicine scan. Common bile duct: Diameter: 5 mm.  Normal. Liver: No focal lesion identified. Within normal limits in parenchymal echogenicity. Portal vein is patent on color Doppler imaging with normal direction of blood flow towards the liver. Insignificant 1.5 cm cyst in the right lobe. Other: None. IMPRESSION: Thickened gallbladder wall with some surrounding fluid. No visible stone or sludge. No Murphy sign. If clinical concern persists, consider nuclear medicine hepatobiliary scan. At this point, acalculous cholecystitis is not excluded based on the imaging. Electronically Signed   By: Paulina Fusi M.D.   On: 09/20/2020 12:13     Subjective: Patient was seen and examined before discharge.  No new complaint.  Percutaneous drain with around 150 cc of reddish fluid.  Ready to go home.  Discharge  Exam: Vitals:   09/23/20 0410 09/23/20 0814  BP: 124/82 130/73  Pulse: (!) 57 62  Resp:  18  Temp: 98.2 F (36.8 C) 98.1 F (36.7 C)  SpO2: 94% 96%   Vitals:   09/22/20 2216 09/23/20 0038 09/23/20 0410 09/23/20 0814  BP: 108/69  124/82 130/73  Pulse: 76  (!) 57 62  Resp: 18   18  Temp: 98.4 F (36.9 C)  98.2 F (36.8 C) 98.1 F (36.7 C)  TempSrc: Oral  Oral Oral  SpO2: 95%  94% 96%  Weight:  69.1 kg    Height:        General: Pt is alert, awake, not in acute distress Cardiovascular: RRR, S1/S2 +, no rubs, no gallops Respiratory: CTA bilaterally, no wheezing, no rhonchi Abdominal: Soft, NT, ND, bowel sounds + Extremities: no edema, no cyanosis   The results of significant diagnostics from this hospitalization (including imaging, microbiology, ancillary and laboratory) are listed below for reference.    Microbiology: Recent Results (from the past 240 hour(s))  SARS CORONAVIRUS 2 (TAT 6-24 HRS) Nasopharyngeal Nasopharyngeal Swab     Status: None   Collection Time: 09/20/20  7:06 AM   Specimen: Nasopharyngeal Swab  Result Value Ref Range Status   SARS Coronavirus 2 NEGATIVE NEGATIVE Final    Comment: (NOTE) SARS-CoV-2 target nucleic acids are NOT DETECTED.  The SARS-CoV-2 RNA is generally detectable in upper and lower respiratory specimens during the acute phase of infection. Negative results do not preclude SARS-CoV-2 infection, do not rule out co-infections with other pathogens, and should not be used as the sole basis for treatment or other patient management decisions. Negative results must be combined with clinical observations, patient history, and epidemiological information. The expected result is Negative.  Fact Sheet for Patients: HairSlick.no  Fact Sheet for Healthcare Providers: quierodirigir.com  This  test is not yet approved or cleared by the [place] and  has been authorized for  detection and/or diagnosis of SARS-CoV-2 by FDA under an Emergency Use Authorization (EUA). This EUA will remain  in effect (meaning this test can be used) for the duration of the COVID-19 declaration under Se ction 564(b)(1) of the Act, 21 U.S.C. section 360bbb-3(b)(1), unless the authorization is terminated or revoked sooner.  Performed at [facility], [address] [place], [place] [address]   Aerobic/Anaerobic Culture (surgical/deep wound)     Status: None (Preliminary result)   Collection Time: 09/22/20  2:30 PM   Specimen: BILE  Result Value Ref Range Status   Specimen Description   Final    BILE Performed at [facility], [address] [place], [place] [address]    Special Requests   Final    NONE Performed at [facility], [address] [place], [place] [address]    Gram Stain   Final    MODERATE WBC PRESENT, PREDOMINANTLY PMN NO ORGANISMS SEEN    Culture   Final    NO GROWTH < 12 HOURS Performed at [facility], [address] [place], [place] [address]    Report Status PENDING  Incomplete     Labs: BNP (last 3 results) Recent Labs    07/28/20 0833  BNP 37.9   Basic Metabolic Panel: Recent Labs  Lab 09/20/20 0209 09/21/20 0437  NA 142 136  K 4.0 4.0  CL 108 101  CO2 27 26  GLUCOSE 150* 103*  BUN 15 8  CREATININE 0.70 0.62  CALCIUM 9.5 9.0   Liver Function Tests: Recent Labs  Lab 09/20/20 0209  AST 38  ALT 54*  ALKPHOS 74  BILITOT 1.0  PROT 6.9  ALBUMIN 3.8   Recent Labs  Lab 09/20/20 0209  LIPASE 34   No results for input(s): AMMONIA in the last 168 hours. CBC: Recent Labs  Lab 09/20/20 0209 09/21/20 0437 09/22/20 0431  WBC 11.0* 10.9* 9.6  HGB 13.8 15.0 14.8  HCT 40.3 43.8 44.0  MCV 97.1 97.3 98.9  PLT 221 219 191   Cardiac Enzymes: No results for input(s): CKTOTAL, CKMB, CKMBINDEX, TROPONINI in the last 168 hours. BNP: Invalid input(s): POCBNP CBG: No results for input(s): GLUCAP  in the last 168 hours. D-Dimer No results for input(s): DDIMER in the last 72 hours. Hgb A1c No results for input(s): HGBA1C in the last 72 hours. Lipid Profile Recent Labs    09/21/20 0437  CHOL 105  HDL 40*  LDLCALC 54  TRIG 56  CHOLHDL 2.6   Thyroid function studies No results for input(s): TSH, T4TOTAL, T3FREE, THYROIDAB in the last 72 hours.  Invalid input(s): FREET3 Anemia work up No results for input(s): VITAMINB12, FOLATE, FERRITIN, TIBC, IRON, RETICCTPCT in the last 72 hours. Urinalysis No results found for: COLORURINE, APPEARANCEUR, LABSPEC, PHURINE, GLUCOSEU, HGBUR, BILIRUBINUR, KETONESUR, PROTEINUR, UROBILINOGEN, NITRITE, LEUKOCYTESUR Sepsis Labs Invalid input(s): PROCALCITONIN,  WBC,  LACTICIDVEN Microbiology Recent Results (from the past 240 hour(s))  SARS CORONAVIRUS 2 (TAT 6-24 HRS) Nasopharyngeal Nasopharyngeal Swab     Status: None   Collection Time: 09/20/20  7:06 AM   Specimen: Nasopharyngeal Swab  Result Value Ref Range Status   SARS Coronavirus 2 NEGATIVE NEGATIVE Final    Comment: (NOTE) SARS-CoV-2 target nucleic acids are NOT DETECTED.  The SARS-CoV-2 RNA is generally detectable in upper and lower respiratory specimens during the acute phase of infection. Negative  results do not preclude SARS-CoV-2 infection, do not rule out co-infections with other pathogens, and should not be used as the sole basis for treatment or other patient management decisions. Negative results must be combined with clinical observations, patient history, and epidemiological information. The expected result is Negative.  Fact Sheet for Patients: HairSlick.nohttps://www.fda.gov/media/138098/download  Fact Sheet for Healthcare Providers: quierodirigir.comhttps://www.fda.gov/media/138095/download  This test is not yet approved or cleared by the [place] FDA and  has been authorized for detection and/or diagnosis of SARS-CoV-2 by FDA under an Emergency Use Authorization (EUA). This EUA will  remain  in effect (meaning this test can be used) for the duration of the COVID-19 declaration under Se ction 564(b)(1) of the Act, 21 U.S.C. section 360bbb-3(b)(1), unless the authorization is terminated or revoked sooner.  Performed at [facility], [address] [place], [place] [address]   Aerobic/Anaerobic Culture (surgical/deep wound)     Status: None (Preliminary result)   Collection Time: 09/22/20  2:30 PM   Specimen: BILE  Result Value Ref Range Status   Specimen Description   Final    BILE Performed at [facility], [address] [place], [place] [address]    Special Requests   Final    NONE Performed at [facility], [address] [place], [place] [address]    Gram Stain   Final    MODERATE WBC PRESENT, PREDOMINANTLY PMN NO ORGANISMS SEEN    Culture   Final    NO GROWTH < 12 HOURS Performed at [facility], [address] [place], [place] [address]    Report Status PENDING  Incomplete    Time coordinating discharge: Over 30 minutes  SIGNED:  Arnetha CourserSumayya Daliana Leverett, MD  [facility] 09/23/2020, 11:00 AM  If 7PM-7AM, please contact night-coverage [email or web address]  This record has been created using Conservation officer, [employer]Dragon voice recognition software. Errors have been sought and corrected,but may not always be located. Such creation errors do not reflect on the standard of care.

## 2020-09-23 NOTE — Plan of Care (Signed)

## 2020-09-26 ENCOUNTER — Telehealth: Payer: Self-pay

## 2020-09-26 NOTE — Telephone Encounter (Signed)
Transition Care Management Follow-up Telephone Call  Date of discharge and from where: 09/23/2020, [facility]  How have you been since you were released from the hospital? Patient is doing okay.   Any questions or concerns? Yes, Patient states that he was sent home with tubes in his abdomen and he is concerned that they may get infected and clogged up. He is having trouble draining them and needs direction from the doctor. He wants to know if he can have home health services come in to help with this.   Items Reviewed:  Did the pt receive and understand the discharge instructions provided? Yes   Medications obtained and verified? Yes   Other? No   Any new allergies since your discharge? No   Dietary orders reviewed? Yes  Do you have support at home? Yes   Home Care and Equipment/Supplies: Were home health services ordered? not applicable If so, what is the name of the agency? N/A  Has the agency set up a time to come to the patient's home? not applicable Were any new equipment or medical supplies ordered?  No What is the name of the medical supply agency? N/A Were you able to get the supplies/equipment? not applicable Do you have any questions related to the use of the equipment or supplies? No  Functional Questionnaire: (I = Independent and D = Dependent) ADLs: I  Bathing/Dressing- I  Meal Prep- I  Eating- I  Maintaining continence- I  Transferring/Ambulation- I  Managing Meds- I  Follow up appointments reviewed:   PCP Hospital f/u appt confirmed? Yes  Scheduled to see Aura Dials, NP on 09/28/2020 @ 10:20 am.  [facility] f/u appt confirmed? Yes  Scheduled to see Dr. Claudine Mouton on 10/11/2020 @ 9 am.  Are transportation arrangements needed? No   If their condition worsens, is the pt aware to call PCP or go to the Emergency Dept.? Yes  Was the patient provided with contact information for the PCP's office or ED? Yes  Was to pt encouraged to call back with  questions or concerns? Yes

## 2020-09-27 ENCOUNTER — Encounter: Payer: Self-pay | Admitting: *Deleted

## 2020-09-27 DIAGNOSIS — I213 ST elevation (STEMI) myocardial infarction of unspecified site: Secondary | ICD-10-CM

## 2020-09-28 ENCOUNTER — Ambulatory Visit (INDEPENDENT_AMBULATORY_CARE_PROVIDER_SITE_OTHER): Payer: Medicare Other | Admitting: Nurse Practitioner

## 2020-09-28 ENCOUNTER — Encounter: Payer: Self-pay | Admitting: Nurse Practitioner

## 2020-09-28 ENCOUNTER — Other Ambulatory Visit: Payer: Self-pay

## 2020-09-28 VITALS — BP 130/77 | HR 60 | Temp 97.8°F | Wt 153.2 lb

## 2020-09-28 DIAGNOSIS — I2111 ST elevation (STEMI) myocardial infarction involving right coronary artery: Secondary | ICD-10-CM | POA: Diagnosis not present

## 2020-09-28 DIAGNOSIS — I200 Unstable angina: Secondary | ICD-10-CM | POA: Diagnosis not present

## 2020-09-28 DIAGNOSIS — D692 Other nonthrombocytopenic purpura: Secondary | ICD-10-CM | POA: Diagnosis not present

## 2020-09-28 DIAGNOSIS — F17219 Nicotine dependence, cigarettes, with unspecified nicotine-induced disorders: Secondary | ICD-10-CM | POA: Diagnosis not present

## 2020-09-28 DIAGNOSIS — K819 Cholecystitis, unspecified: Secondary | ICD-10-CM | POA: Diagnosis not present

## 2020-09-28 LAB — AEROBIC/ANAEROBIC CULTURE W GRAM STAIN (SURGICAL/DEEP WOUND): Culture: NO GROWTH

## 2020-09-28 NOTE — Patient Instructions (Addendum)
See general surgery tomorrow at 11:30 am  Gallbladder Eating Plan If you have a gallbladder condition, you may have trouble digesting fats. Eating a low-fat diet can help reduce your symptoms, and may be helpful before and after having surgery to remove your gallbladder (cholecystectomy). Your health care provider may recommend that you work with a diet and nutrition specialist (dietitian) to help you reduce the amount of fat in your diet. What are tips for following this plan? General guidelines  Limit your fat intake to less than 30% of your total daily calories. If you eat around 1,800 calories each day, this is less than 60 grams (g) of fat per day.  Fat is an important part of a healthy diet. Eating a low-fat diet can make it hard to maintain a healthy body weight. Ask your dietitian how much fat, calories, and other nutrients you need each day.  Eat small, frequent meals throughout the day instead of three large meals.  Drink at least 8-10 cups of fluid a day. Drink enough fluid to keep your urine clear or pale yellow.  Limit alcohol intake to no more than 1 drink a day for nonpregnant women and 2 drinks a day for men. One drink equals 12 oz of beer, 5 oz of wine, or 1 oz of hard liquor. Reading food labels  Check Nutrition Facts on food labels for the amount of fat per serving. Choose foods with less than 3 grams of fat per serving.   Shopping  Choose nonfat and low-fat healthy foods. Look for the words "nonfat," "low fat," or "fat free."  Avoid buying processed or prepackaged foods. Cooking  Cook using low-fat methods, such as baking, broiling, grilling, or boiling.  Cook with small amounts of healthy fats, such as olive oil, grapeseed oil, canola oil, or sunflower oil. What foods are recommended?  All fresh, frozen, or canned fruits and vegetables.  Whole grains.  Low-fat or non-fat (skim) milk and yogurt.  Lean meat, skinless poultry, fish, eggs, and beans.  Low-fat  protein supplement powders or drinks.  Spices and herbs. What foods are not recommended?  High-fat foods. These include baked goods, fast food, fatty cuts of meat, ice cream, french toast, sweet rolls, pizza, cheese bread, foods covered with butter, creamy sauces, or cheese.  Fried foods. These include french fries, tempura, battered fish, breaded chicken, fried breads, and sweets.  Foods with strong odors.  Foods that cause bloating and gas. Summary  A low-fat diet can be helpful if you have a gallbladder condition, or before and after gallbladder surgery.  Limit your fat intake to less than 30% of your total daily calories. This is about 60 g of fat if you eat 1,800 calories each day.  Eat small, frequent meals throughout the day instead of three large meals. This information is not intended to replace advice given to you by your health care provider. Make sure you discuss any questions you have with your health care provider. Document Revised: 02/18/2020 Document Reviewed: 02/18/2020 Elsevier Patient Education  2021 [employer].

## 2020-09-28 NOTE — Assessment & Plan Note (Signed)
As evidenced by bruising upper extremities with ASA use.  Recommend gentle skin care and monitor for wounds, if present immediately notify provider. 

## 2020-09-28 NOTE — Assessment & Plan Note (Addendum)
Acute with current drain, symptoms have improved at this time.  Able to reschedule general surgery appointment to tomorrow, as patient would like to see sooner and attain surgery if required -- so he could then return to cardiac rehab.  Will review general surgery note tomorrow, appreciate their input.  CBC and CMP today.

## 2020-09-28 NOTE — Assessment & Plan Note (Signed)
Ongoing with recent MI, at this time will continue collaboration with cardiology and medication regimen as prescribed by them.

## 2020-09-28 NOTE — Assessment & Plan Note (Addendum)
Stable at this time, event took place 07/28/20.  Recommend he continue all current medications and collaboration with cardiology.  Continue cardiac rehab once able to return -- currently awaiting general surgery follow-up.

## 2020-09-28 NOTE — Assessment & Plan Note (Signed)
Recommend continued cessation of smoking. 

## 2020-09-28 NOTE — Progress Notes (Addendum)
BP 130/77   Pulse 60   Temp 97.8 F (36.6 C) (Oral)   Wt 153 lb 3.2 oz (69.5 kg)   SpO2 98%   BMI 23.99 kg/m    Subjective:    Patient ID: Andrew Potts, male    DOB: [birth year], 70 y.o.   MRN: [id number]  HPI: Andrew Potts is a 70 y.o. male  Chief Complaint  Patient presents with  . Hospitalization Follow-up    Patient is here for hospital follow up for chest pain. Patient states his doctor called him recently to questioning why he was not discharged with a home health nurse. Patient states he is was given all the proper equipment when he was discharged from the hospital and wanted to discuss with provider at today's visit.   Transition of Care Hospital Follow up.  Recent admission to hospital with chest pain, history of recent MI.  At this time was cleared from cardiac standpoint in ER and noted to have gallbladder wall inflammation, had percutaneous drain placed and is to follow with general surgery outpatient.  He thought he was having a heart attack.  At this time reports pain he was having has improved, has some ongoing aggravation to right upper side/place of drain -- does report running out of supplies for drain including gauze for drain area and tape.  When he went to cardio rehab this Monday he was told he could not attend due to drain.  To see general surgery on the 29th.  Continues to have red drainage from percutaneous drain.  Heart burn has improved since drain placement.  Denies CP, N&V, SOB, diaphoresis.  Continues to not smoke.    "Admit date: 09/20/2020 Discharge date: 09/23/2020  Admitted From: Home Disposition: Home  Recommendations for Outpatient Follow-up:  1. Follow up with PCP in 1-2 weeks 2. Follow-up for surgery 3. Please obtain BMP/CBC in one week 4. Please follow up on the following pending results: None  Home Health: No Equipment/Devices: Percutaneous cholecystostomy drain Discharge Condition: Stable CODE STATUS: Full Diet recommendation:  Heart Healthy / Carb Modified   Brief/Interim Summary: Andrew L Stuttsis a 69 y.o.malewith medical history significant formultivessel coronary artery disease status post recent PCI with stent angioplasty to the RCA following a non-ST elevation MI, ischemic cardiomyopathy with last known LVEF of 40 to 45%, COPD who presents to the emergency room via EMS for evaluation of sudden onset central chest pain mostly in the lower sternal area. Symptoms did not resolved completely with multiple nitroglycerin. CTA was negative for PE and a questionable gallbladder wall inflammation, right upper quadrant ultrasound with some bladder wall thickening and recommending nuclear medicine studies, HIDA scan with some concern of cystic duct/cholecystitis. EKG without any acute abnormality and troponin barely positive, cardiology cleared him from cardiac standpoint. Surgery was consulted for concern of HIDA scan findings and they recommend placing of percutaneous drain with IR in order to postpone surgery due to his history of recent MI with PCI.  Percutaneous drain was placed successfully with good drainage.  Cultures remain negative.  Patient will follow up with surgery as an outpatient for definitive management of his gallbladder disease.  No need for antibiotics at this time.  He will continue with rest of his home medications and follow-up with his providers.  Discharge Diagnoses:  Principal Problem:   Unstable angina (HCC) Active Problems:   COPD (chronic obstructive pulmonary disease) (HCC)   CAD, multiple vessel   Ischemic cardiomyopathy   Cholecystitis Continue taking  your medications, your cardiologist added 1 more for your angina. Please follow the directions for your drain care given to you by surgery, follow-up with surgery closely for optimum care of your gallbladder problem."  Hospital/Facility: [facility] D/C Physician: Dr. Nelson Chimes D/C Date: 09/23/20  Records Requested: 09/28/20 Records Received:  09/28/20 Records Reviewed: 09/28/20  Diagnoses on Discharge: Unstable angina and Cholecystitis  Date of interactive Contact within 48 hours of discharge:  Contact was through: phone  Date of 7 day or 14 day face-to-face visit:    within 14 days  Outpatient Encounter Medications as of 09/28/2020  Medication Sig  . aspirin EC 81 MG EC tablet Take 1 tablet (81 mg total) by mouth daily. Swallow whole.  Marland Kitchen atorvastatin (LIPITOR) 80 MG tablet Take 1 tablet (80 mg total) by mouth daily.  . isosorbide mononitrate (IMDUR) 30 MG 24 hr tablet Take 1 tablet (30 mg total) by mouth daily.  Marland Kitchen lisinopril (ZESTRIL) 5 MG tablet Take 1 tablet (5 mg total) by mouth daily.  . metoprolol tartrate (LOPRESSOR) 25 MG tablet Take 0.5 tablets (12.5 mg total) by mouth 2 (two) times daily.  . mometasone-formoterol (DULERA) 200-5 MCG/ACT AERO Inhale 2 puffs into the lungs in the morning and at bedtime.  . nitroGLYCERIN (NITROSTAT) 0.4 MG SL tablet Place 1 tablet (0.4 mg total) under the tongue every 5 (five) minutes as needed for chest pain.  . pantoprazole (PROTONIX) 40 MG tablet Take 1 tablet (40 mg total) by mouth daily.  . prasugrel (EFFIENT) 10 MG TABS tablet Take 10 mg by mouth daily.  Marland Kitchen PROAIR HFA 108 (90 Base) MCG/ACT inhaler Inhale 2 puffs into the lungs every 4 (four) hours as needed.  . SYMBICORT 160-4.5 MCG/ACT inhaler Inhale 2 puffs into the lungs 2 (two) times daily.   No facility-administered encounter medications on file as of 09/28/2020.    Diagnostic Tests Reviewed/Disposition: Reviewed on chart  Consults: General surgery  Discharge Instructions: As above   Disease/illness Education: Educated him today on acute illness  Home Health/Community Services Discussions/Referrals: None  Establishment or re-establishment of referral orders for community resources: None  Discussion with other health care providers: Reviewed notes  Assessment and Support of treatment regimen adherence: Reviewed with  patient today  Appointments Coordinated with: Reviewed with patient today -- will attempt to get into general surgery sooner  Education for self-management, independent living, and ADLs: Reviewed with patient today  Relevant past medical, surgical, family and social history reviewed and updated as indicated. Interim medical history since our last visit reviewed. Allergies and medications reviewed and updated.  Review of Systems  Constitutional: Negative for activity change, diaphoresis, fatigue and fever.  Respiratory: Negative for cough, chest tightness, shortness of breath and wheezing.   Cardiovascular: Negative for chest pain, palpitations and leg swelling.  Gastrointestinal: Negative.   Neurological: Negative.   Psychiatric/Behavioral: Negative.     Per HPI unless specifically indicated above     Objective:    BP 130/77   Pulse 60   Temp 97.8 F (36.6 C) (Oral)   Wt 153 lb 3.2 oz (69.5 kg)   SpO2 98%   BMI 23.99 kg/m   Wt Readings from Last 3 Encounters:  09/28/20 153 lb 3.2 oz (69.5 kg)  09/23/20 152 lb 6.4 oz (69.1 kg)  08/24/20 152 lb 3.2 oz (69 kg)    Physical Exam Vitals and nursing note reviewed.  Constitutional:      General: He is awake. He is not in acute distress.  Appearance: He is well-developed and well-groomed. He is not ill-appearing.  HENT:     Head: Normocephalic and atraumatic.     Right Ear: Hearing normal. No drainage.     Left Ear: Hearing normal. No drainage.  Eyes:     General: Lids are normal.        Right eye: No discharge.        Left eye: No discharge.     Conjunctiva/sclera: Conjunctivae normal.     Pupils: Pupils are equal, round, and reactive to light.  Neck:     Thyroid: No thyromegaly.     Vascular: No carotid bruit.     Trachea: Trachea normal.  Cardiovascular:     Rate and Rhythm: Normal rate and regular rhythm.     Heart sounds: Normal heart sounds, S1 normal and S2 normal. No murmur heard. No gallop.   Pulmonary:      Effort: Pulmonary effort is normal.     Breath sounds: Normal breath sounds.  Abdominal:     General: Bowel sounds are normal. There is no distension.     Palpations: Abdomen is soft. There is no hepatomegaly.     Tenderness: There is no abdominal tenderness.    Musculoskeletal:        General: Normal range of motion.     Cervical back: Normal range of motion and neck supple.     Right lower leg: No edema.     Left lower leg: No edema.  Skin:    General: Skin is warm and dry.     Capillary Refill: Capillary refill takes less than 2 seconds.     Comments: Scattered pale purple bruises noted to bilatera upper extremities.  Neurological:     Mental Status: He is alert and oriented to person, place, and time.     Deep Tendon Reflexes: Reflexes are normal and symmetric.  Psychiatric:        Attention and Perception: Attention normal.        Mood and Affect: Mood normal.        Speech: Speech normal.        Behavior: Behavior normal. Behavior is cooperative.        Thought Content: Thought content normal.    Results for orders placed or performed during the hospital encounter of 09/20/20  SARS CORONAVIRUS 2 (TAT 6-24 HRS) Nasopharyngeal Nasopharyngeal Swab   Specimen: Nasopharyngeal Swab  Result Value Ref Range   SARS Coronavirus 2 NEGATIVE NEGATIVE  Aerobic/Anaerobic Culture (surgical/deep wound)   Specimen: BILE  Result Value Ref Range   Specimen Description      BILE Performed at [facility], [address], [place], [place] [address]    Special Requests      NONE Performed at [facility], [address] [place], [place] [address]    Gram Stain      MODERATE WBC PRESENT, PREDOMINANTLY PMN NO ORGANISMS SEEN    Culture      No growth aerobically or anaerobically. Performed at [facility], [address], [place], [place] [address]    Report Status PENDING   Basic metabolic panel  Result Value Ref Range   Sodium 142 135 - 145 mmol/L    Potassium 4.0 3.5 - 5.1 mmol/L   Chloride 108 98 - 111 mmol/L   CO2 27 22 - 32 mmol/L   Glucose, Bld 150 (H) 70 - 99 mg/dL   BUN 15 8 - 23 mg/dL   Creatinine,  Ser 0.70 0.61 - 1.24 mg/dL   Calcium 9.5 8.9 - 27.0 mg/dL   GFR, Estimated >62 >37 mL/min   Anion gap 7 5 - 15  CBC  Result Value Ref Range   WBC 11.0 (H) 4.0 - 10.5 K/uL   RBC 4.15 (L) 4.22 - 5.81 MIL/uL   Hemoglobin 13.8 13.0 - 17.0 g/dL   HCT 62.8 31.5 - 17.6 %   MCV 97.1 80.0 - 100.0 fL   MCH 33.3 26.0 - 34.0 pg   MCHC 34.2 30.0 - 36.0 g/dL   RDW 16.0 73.7 - 10.6 %   Platelets 221 150 - 400 K/uL   nRBC 0.0 0.0 - 0.2 %  Hepatic function panel  Result Value Ref Range   Total Protein 6.9 6.5 - 8.1 g/dL   Albumin 3.8 3.5 - 5.0 g/dL   AST 38 15 - 41 U/L   ALT 54 (H) 0 - 44 U/L   Alkaline Phosphatase 74 38 - 126 U/L   Total Bilirubin 1.0 0.3 - 1.2 mg/dL   Bilirubin, Direct 0.2 0.0 - 0.2 mg/dL   Indirect Bilirubin 0.8 0.3 - 0.9 mg/dL  Lipase, blood  Result Value Ref Range   Lipase 34 11 - 51 U/L  APTT  Result Value Ref Range   aPTT 29 24 - 36 seconds  Protime-INR  Result Value Ref Range   Prothrombin Time 13.8 11.4 - 15.2 seconds   INR 1.1 0.8 - 1.2  HIV Antibody (routine testing w rflx)  Result Value Ref Range   HIV Screen 4th Generation wRfx Non Reactive Non Reactive  Heparin level (unfractionated)  Result Value Ref Range   Heparin Unfractionated 0.37 0.30 - 0.70 IU/mL  Basic metabolic panel  Result Value Ref Range   Sodium 136 135 - 145 mmol/L   Potassium 4.0 3.5 - 5.1 mmol/L   Chloride 101 98 - 111 mmol/L   CO2 26 22 - 32 mmol/L   Glucose, Bld 103 (H) 70 - 99 mg/dL   BUN 8 8 - 23 mg/dL   Creatinine, Ser 2.69 0.61 - 1.24 mg/dL   Calcium 9.0 8.9 - 48.5 mg/dL   GFR, Estimated >46 >27 mL/min   Anion gap 9 5 - 15  CBC  Result Value Ref Range   WBC 10.9 (H) 4.0 - 10.5 K/uL   RBC 4.50 4.22 - 5.81 MIL/uL   Hemoglobin 15.0 13.0 - 17.0 g/dL   HCT 03.5 00.9 - 38.1 %   MCV 97.3 80.0 - 100.0 fL   MCH 33.3  26.0 - 34.0 pg   MCHC 34.2 30.0 - 36.0 g/dL   RDW 82.9 93.7 - 16.9 %   Platelets 219 150 - 400 K/uL   nRBC 0.0 0.0 - 0.2 %  Lipid panel  Result Value Ref Range   Cholesterol 105 0 - 200 mg/dL   Triglycerides 56 <678 mg/dL   HDL 40 (L) >93 mg/dL   Total CHOL/HDL Ratio 2.6 RATIO   VLDL 11 0 - 40 mg/dL   LDL Cholesterol 54 0 - 99 mg/dL  Heparin level (unfractionated)  Result Value Ref Range   Heparin Unfractionated 0.48 0.30 - 0.70 IU/mL  Heparin level (unfractionated)  Result Value Ref Range   Heparin Unfractionated 0.50 0.30 - 0.70 IU/mL  CBC  Result Value Ref Range   WBC 9.6 4.0 - 10.5 K/uL   RBC 4.45 4.22 - 5.81 MIL/uL   Hemoglobin 14.8 13.0 - 17.0 g/dL   HCT 81.0 17.5 - 10.2 %  MCV 98.9 80.0 - 100.0 fL   MCH 33.3 26.0 - 34.0 pg   MCHC 33.6 30.0 - 36.0 g/dL   RDW 03.412.5 74.211.5 - 59.515.5 %   Platelets 191 150 - 400 K/uL   nRBC 0.0 0.0 - 0.2 %  Troponin I (High Sensitivity)  Result Value Ref Range   Troponin I (High Sensitivity) 22 (H) <18 ng/L  Troponin I (High Sensitivity)  Result Value Ref Range   Troponin I (High Sensitivity) 26 (H) <18 ng/L  Troponin I (High Sensitivity)  Result Value Ref Range   Troponin I (High Sensitivity) 31 (H) <18 ng/L  Troponin I (High Sensitivity)  Result Value Ref Range   Troponin I (High Sensitivity) 35 (H) <18 ng/L      Assessment & Plan:   Problem List Items Addressed This Visit      Cardiovascular and Mediastinum   STEMI involving right coronary artery (HCC) - Primary    Stable at this time, event took place 07/28/20.  Recommend he continue all current medications and collaboration with cardiology.  Continue cardiac rehab once able to return -- currently awaiting general surgery follow-up.      Unstable angina (HCC)    Ongoing with recent MI, at this time will continue collaboration with cardiology and medication regimen as prescribed by them.      Senile purpura (HCC)    As evidenced by bruising upper extremities with ASA use.   Recommend gentle skin care and monitor for wounds, if present immediately notify provider.        Digestive   Cholecystitis    Acute with current drain, symptoms have improved at this time.  Able to reschedule general surgery appointment to tomorrow, as patient would like to see sooner and attain surgery if required -- so he could then return to cardiac rehab.  Will review general surgery note tomorrow, appreciate their input.  CBC and CMP today.      Relevant Orders   Comprehensive metabolic panel   CBC with Differential/Platelet     Nervous and Auditory   Nicotine dependence, cigarettes, w unsp disorders    Recommend continued cessation of smoking.         Time: 25 minutes, >50% spent counseling/or care coordination   Follow up plan: Return for return as scheduled upcoming with PCP.

## 2020-09-29 ENCOUNTER — Other Ambulatory Visit: Payer: Self-pay | Admitting: Nurse Practitioner

## 2020-09-29 ENCOUNTER — Encounter: Payer: Self-pay | Admitting: Surgery

## 2020-09-29 ENCOUNTER — Ambulatory Visit (INDEPENDENT_AMBULATORY_CARE_PROVIDER_SITE_OTHER): Payer: Medicare Other | Admitting: Surgery

## 2020-09-29 VITALS — BP 121/65 | HR 76 | Temp 97.9°F | Ht 67.0 in | Wt 152.0 lb

## 2020-09-29 DIAGNOSIS — Z434 Encounter for attention to other artificial openings of digestive tract: Secondary | ICD-10-CM | POA: Diagnosis not present

## 2020-09-29 DIAGNOSIS — K810 Acute cholecystitis: Secondary | ICD-10-CM

## 2020-09-29 DIAGNOSIS — E875 Hyperkalemia: Secondary | ICD-10-CM

## 2020-09-29 LAB — CBC WITH DIFFERENTIAL/PLATELET
Basophils Absolute: 0.1 10*3/uL (ref 0.0–0.2)
Basos: 1 %
EOS (ABSOLUTE): 0.4 10*3/uL (ref 0.0–0.4)
Eos: 4 %
Hematocrit: 41.3 % (ref 37.5–51.0)
Hemoglobin: 13.9 g/dL (ref 13.0–17.7)
Immature Grans (Abs): 0.1 10*3/uL (ref 0.0–0.1)
Immature Granulocytes: 1 %
Lymphocytes Absolute: 2.7 10*3/uL (ref 0.7–3.1)
Lymphs: 28 %
MCH: 32.9 pg (ref 26.6–33.0)
MCHC: 33.7 g/dL (ref 31.5–35.7)
MCV: 98 fL — ABNORMAL HIGH (ref 79–97)
Monocytes Absolute: 0.9 10*3/uL (ref 0.1–0.9)
Monocytes: 10 %
Neutrophils Absolute: 5.6 10*3/uL (ref 1.4–7.0)
Neutrophils: 56 %
Platelets: 333 10*3/uL (ref 150–450)
RBC: 4.22 x10E6/uL (ref 4.14–5.80)
RDW: 11.9 % (ref 11.6–15.4)
WBC: 9.8 10*3/uL (ref 3.4–10.8)

## 2020-09-29 LAB — COMPREHENSIVE METABOLIC PANEL
ALT: 53 IU/L — ABNORMAL HIGH (ref 0–44)
AST: 48 IU/L — ABNORMAL HIGH (ref 0–40)
Albumin/Globulin Ratio: 1.3 (ref 1.2–2.2)
Albumin: 4 g/dL (ref 3.8–4.8)
Alkaline Phosphatase: 81 IU/L (ref 44–121)
BUN/Creatinine Ratio: 14 (ref 10–24)
BUN: 11 mg/dL (ref 8–27)
Bilirubin Total: 0.8 mg/dL (ref 0.0–1.2)
CO2: 20 mmol/L (ref 20–29)
Calcium: 9.6 mg/dL (ref 8.6–10.2)
Chloride: 101 mmol/L (ref 96–106)
Creatinine, Ser: 0.77 mg/dL (ref 0.76–1.27)
Globulin, Total: 3 g/dL (ref 1.5–4.5)
Glucose: 87 mg/dL (ref 65–99)
Potassium: 5.9 mmol/L — ABNORMAL HIGH (ref 3.5–5.2)
Sodium: 137 mmol/L (ref 134–144)
Total Protein: 7 g/dL (ref 6.0–8.5)
eGFR: 98 mL/min/{1.73_m2} (ref >59)

## 2020-09-29 NOTE — Progress Notes (Signed)
Patient ID: Andrew Potts, male   DOB: 01/17/1951, 70 y.o.   MRN: [id number]  Chief Complaint: Cholecystostomy drain  History of Present Illness Andrew Potts is a 70 y.o. male with a percutaneous cholecystostomy drain, placed for acute cholecystitis following heart attack in mid January.  The drain was placed March 10.  He continues to have serosanguineous output which may be bile tinged.  He denies nausea, vomiting, fevers and chills.  He reports normal colored bowel movements.  He complains of tenderness at the drain site.  He complains of the drain.  He was hospitalized for 3 days in March being discharged on the 11th.  Past Medical History Past Medical History:  Diagnosis Date  . Arthritis   . CHF (congestive heart failure) (HCC)   . COPD (chronic obstructive pulmonary disease) (HCC)   . Coronary artery disease    STEMI      Past Surgical History:  Procedure Laterality Date  . ABDOMINAL EXPLORATION SURGERY    . CORONARY/GRAFT ACUTE MI REVASCULARIZATION N/A 07/28/2020   Procedure: CORONARY/GRAFT ACUTE MI REVASCULARIZATION;  Surgeon: Alwyn Pea, MD;  Location: [facility] INV[facility]IVE [facility];  Service: Cardiovascular;  Laterality: N/A;  . LEFT HEART CATH AND CORONARY ANGIOGRAPHY N/A 07/28/2020   Procedure: LEFT HEART CATH AND CORONARY ANGIOGRAPHY;  Surgeon: Alwyn Pea, MD;  Location: [facility] CV [facility];  Service: Cardiovascular;  Laterality: N/A;    Allergies  Allergen Reactions  . Codeine   . Codone [Hydrocodone] Other (See Comments)    Abdominal pain  . Levaquin [Levofloxacin] Swelling    Current Outpatient Medications  Medication Sig Dispense Refill  . aspirin EC 81 MG EC tablet Take 1 tablet (81 mg total) by mouth daily. Swallow whole. 30 tablet 11  . atorvastatin (LIPITOR) 80 MG tablet Take 1 tablet (80 mg total) by mouth daily. 90 tablet 1  . isosorbide mononitrate (IMDUR) 30 MG 24 hr tablet Take 1 tablet (30 mg total) by mouth daily. 30 tablet 0  . lisinopril  (ZESTRIL) 5 MG tablet Take 1 tablet (5 mg total) by mouth daily. 30 tablet 1  . metoprolol tartrate (LOPRESSOR) 25 MG tablet Take 0.5 tablets (12.5 mg total) by mouth 2 (two) times daily. 60 tablet 1  . mometasone-formoterol (DULERA) 200-5 MCG/ACT AERO Inhale 2 puffs into the lungs in the morning and at bedtime.    . nitroGLYCERIN (NITROSTAT) 0.4 MG SL tablet Place 1 tablet (0.4 mg total) under the tongue every 5 (five) minutes as needed for chest pain. 30 tablet 12  . pantoprazole (PROTONIX) 40 MG tablet Take 1 tablet (40 mg total) by mouth daily. 90 tablet 0  . prasugrel (EFFIENT) 10 MG TABS tablet Take 10 mg by mouth daily.    Marland Kitchen PROAIR HFA 108 (90 Base) MCG/ACT inhaler Inhale 2 puffs into the lungs every 4 (four) hours as needed. 18 g 4  . SYMBICORT 160-4.5 MCG/ACT inhaler Inhale 2 puffs into the lungs 2 (two) times daily. 10.2 g 5   No current facility-administered medications for this visit.    Family History Family History  Problem Relation Age of Onset  . Alcohol abuse Father   . Alcohol abuse Brother   . Aneurysm Brother       Social History Social History   Tobacco Use  . Smoking status: Former Smoker    Packs/day: 0.50    Years: 47.00    Pack years: 23.50    Types: Cigarettes    Quit date: 07/28/2020  Years since quitting: 0.1  . Smokeless tobacco: Never Used  . Tobacco comment: 08/24/20 given quit smoking paperwork and encouraged continued cessation  Vaping Use  . Vaping Use: Never used  Substance Use Topics  . Alcohol use: Not Currently  . Drug use: Never        Review of Systems  Constitutional: Negative.   HENT: Negative.   Eyes: Negative.   Respiratory: Negative.   Cardiovascular: Negative.   Gastrointestinal: Negative.   Genitourinary: Negative.   Skin: Negative.   Neurological: Negative.   Psychiatric/Behavioral: Negative.       Physical Exam Blood pressure 121/65, pulse 76, temperature 97.9 F (36.6 C), temperature source Oral, height 5\' 7"   (1.702 m), weight 152 lb (68.9 kg), SpO2 94 %. Last Weight  Most recent update: 09/29/2020 11:43 AM   Weight  68.9 kg (152 lb)            CONSTITUTIONAL: Well developed, and nourished, appropriately responsive and aware without distress.   EYES: Sclera non-icteric.   EARS, NOSE, MOUTH AND THROAT: Mask worn.    Hearing is intact to voice.  NECK: Trachea is midline, and there is no jugular venous distension.  LYMPH NODES:  Lymph nodes in the neck are not enlarged. RESPIRATORY:  Lungs are clear, and breath sounds are equal bilaterally. Normal respiratory effort without pathologic use of accessory muscles. CARDIOVASCULAR: Heart is regular in rate and rhythm. GI: The abdomen is soft, nontender, and nondistended.  Right costal margin drain exit site, with drain intact and secured.  Surrounding skin is clean and dry.  Drain appears to be functioning.  Serosanguineous drainage, possibly bile tinged in bag and tubing.  There were no palpable masses. I did not appreciate hepatosplenomegaly. There were normal bowel sounds. MUSCULOSKELETAL:  Symmetrical muscle tone appreciated in all four extremities.    SKIN: Skin turgor is normal. No pathologic skin lesions appreciated.  NEUROLOGIC:  Motor and sensation appear grossly normal.  Cranial nerves are grossly without defect. PSYCH:  Alert and oriented to person, place and time. Affect is appropriate for situation.  Data Reviewed I have personally reviewed what is currently available of the patient's imaging, recent labs and medical records.   Labs:  CBC Latest Ref Rng & Units 09/28/2020 09/22/2020 09/21/2020  WBC 3.4 - 10.8 x10E3/uL 9.8 9.6 10.9(H)  Hemoglobin 13.0 - 17.7 g/dL 11/21/2020 96.7 89.3  Hematocrit 37.5 - 51.0 % 41.3 44.0 43.8  Platelets 150 - 450 x10E3/uL 333 191 219   CMP Latest Ref Rng & Units 09/28/2020 09/21/2020 09/20/2020  Glucose 65 - 99 mg/dL 87 11/20/2020) 175(Z)  BUN 8 - 27 mg/dL 11 8 15   Creatinine 0.76 - 1.27 mg/dL 025(E 5.27  Sodium 134 -  144 mmol/L 137 136 142  Potassium 3.5 - 5.2 mmol/L 5.9(H) 4.0 4.0  Chloride 96 - 106 mmol/L 101 101 108  CO2 20 - 29 mmol/L 20 26 27   Calcium 8.6 - 10.2 mg/dL 9.6 9.0 9.5  Total Protein 6.0 - 8.5 g/dL 7.0 - 6.9  Total Bilirubin 0.0 - 1.2 mg/dL 0.8 - 1.0  Alkaline Phos 44 - 121 IU/L 81 - 74  AST 0 - 40 IU/L 48(H) - 38  ALT 0 - 44 IU/L 53(H) - 54(H)   Cultures from March 10 drain placement negative.   Imaging: Within last 24 hrs: No results found.  Assessment    Acute acalculous cholecystitis. Recent myocardial infarction, January 2020. Patient Active Problem List   Diagnosis Date Noted  .  Senile purpura (HCC) 09/28/2020  . Cholecystitis 09/21/2020  . Unstable angina (HCC) 09/20/2020  . CAD, multiple vessel 09/20/2020  . Ischemic cardiomyopathy 09/20/2020  . STEMI involving right coronary artery (HCC) 07/28/2020  . HLD (hyperlipidemia) 07/28/2020  . Chronic active hepatitis (HCC) 03/11/2019  . Erectile dysfunction 02/04/2019  . COPD (chronic obstructive pulmonary disease) (HCC) 02/04/2019  . Nicotine dependence, cigarettes, w unsp disorders 02/04/2019  . White coat syndrome without diagnosis of hypertension 02/04/2019    Plan    Continue percutaneous cholecystostomy drain. Obtain cardiology clearance.  We are currently anticipating robotic cholecystectomy April 18.  We will have to determine the timing of discontinuing his anticoagulant preop.  We discussed he may still require some form of drain after his surgery, though this is anticipated dated to be temporary.  This was discussed thoroughly.  Optimal plan is for robotic cholecystectomy.  Risks and benefits have been discussed with the patient which include but are not limited to anesthesia, bleeding, infection, biliary ductal injury or stenosis, other associated unanticipated injuries affiliated with laparoscopic surgery.  I believe there is the desire to proceed, interpreter utilized as needed.  Questions elicited and  answered to satisfaction.  No guarantees ever expressed or implied.   Face-to-face time spent with the patient and accompanying care providers(if present) was 30 minutes, with more than 50% of the time spent counseling, educating, and coordinating care of the patient.      [name] M.D., FACS 09/29/2020, 12:12 PM

## 2020-09-29 NOTE — Progress Notes (Signed)
Please let Seibert know his labs have returned: CBC shows no elevations concerning for infection.  Liver function tests are mildly elevated with current gall bladder issues, avoid alcohol and Tylenol use at this time.  Potassium was a little elevated at 5.9, normal high is 5.2.  I would like you to decrease potassium intake at this time, this includes foods high in potassium like bananas, spinach, mangoes, white and sweet potatoes, nuts, dried fruit, cantaloupe, and raisin bran.  Also increase water intake. I would like you to return in one week to recheck this lab and if ongoing elevations I may need to talk to cardiology about medication changes, as Lisinopril can increase potassium.  Please schedule lab visit for next week.  Any questions? Keep being awesome!!  Thank you for allowing me to participate in your care. Kindest regards, Yarimar Lavis

## 2020-09-29 NOTE — Patient Instructions (Addendum)
Our surgery scheduler Barbara will call you within 24-48 hours to get you scheduled. If you have not heard from her after 48 hours, please call our office. You will need to get Covid tested before surgery and have the blue sheet available when she calls to write down important information. If you have any concerns or questions, please feel free to call our office.  

## 2020-10-04 ENCOUNTER — Telehealth: Payer: Self-pay | Admitting: Surgery

## 2020-10-04 NOTE — Telephone Encounter (Signed)
Patient has been advised of Pre-Admission date/time, COVID Testing date and Surgery date.  Surgery Date: 10/31/20 Preadmission Testing Date: 10/21/20 (phone 8a-1p) Covid Testing Date: 10/27/20 in person @ 9:15 am   - patient [facility] [facility])   Patient has been made aware to call [phone], between 1-3:00pm the day before surgery, to find out what time to arrive for surgery.

## 2020-10-05 ENCOUNTER — Encounter: Payer: Self-pay | Admitting: *Deleted

## 2020-10-05 ENCOUNTER — Other Ambulatory Visit: Payer: Medicare Other

## 2020-10-05 ENCOUNTER — Other Ambulatory Visit: Payer: Self-pay

## 2020-10-05 DIAGNOSIS — E875 Hyperkalemia: Secondary | ICD-10-CM | POA: Diagnosis not present

## 2020-10-05 DIAGNOSIS — I213 ST elevation (STEMI) myocardial infarction of unspecified site: Secondary | ICD-10-CM

## 2020-10-05 NOTE — Progress Notes (Signed)
Cardiac Individual Treatment Plan  Patient Details  Name: Andrew Potts MRN: [id number] Date of Birth: [birth year] Referring Provider:   Flowsheet Row Cardiac Rehab from 08/24/2020 in [facility] Cardiac and Pulmonary Rehab  Referring Provider Harold Hedge MD      Initial Encounter Date:  Flowsheet Row Cardiac Rehab from 08/24/2020 in [facility] Cardiac and Pulmonary Rehab  Date 08/24/20      Visit Diagnosis: ST elevation myocardial infarction (STEMI), unspecified artery (HCC)  Patient's Home Medications on Admission:  Current Outpatient Medications:  .  aspirin EC 81 MG EC tablet, Take 1 tablet (81 mg total) by mouth daily. Swallow whole., Disp: 30 tablet, Rfl: 11 .  atorvastatin (LIPITOR) 80 MG tablet, Take 1 tablet (80 mg total) by mouth daily., Disp: 90 tablet, Rfl: 1 .  isosorbide mononitrate (IMDUR) 30 MG 24 hr tablet, Take 1 tablet (30 mg total) by mouth daily., Disp: 30 tablet, Rfl: 0 .  lisinopril (ZESTRIL) 5 MG tablet, Take 1 tablet (5 mg total) by mouth daily., Disp: 30 tablet, Rfl: 1 .  metoprolol tartrate (LOPRESSOR) 25 MG tablet, Take 0.5 tablets (12.5 mg total) by mouth 2 (two) times daily., Disp: 60 tablet, Rfl: 1 .  mometasone-formoterol (DULERA) 200-5 MCG/ACT AERO, Inhale 2 puffs into the lungs in the morning and at bedtime., Disp: , Rfl:  .  nitroGLYCERIN (NITROSTAT) 0.4 MG SL tablet, Place 1 tablet (0.4 mg total) under the tongue every 5 (five) minutes as needed for chest pain., Disp: 30 tablet, Rfl: 12 .  pantoprazole (PROTONIX) 40 MG tablet, Take 1 tablet (40 mg total) by mouth daily., Disp: 90 tablet, Rfl: 0 .  prasugrel (EFFIENT) 10 MG TABS tablet, Take 10 mg by mouth daily., Disp: , Rfl:  .  PROAIR HFA 108 (90 Base) MCG/ACT inhaler, Inhale 2 puffs into the lungs every 4 (four) hours as needed., Disp: 18 g, Rfl: 4 .  SYMBICORT 160-4.5 MCG/ACT inhaler, Inhale 2 puffs into the lungs 2 (two) times daily., Disp: 10.2 g, Rfl: 5  Past Medical History: Past Medical History:   Diagnosis Date  . Arthritis   . CHF (congestive heart failure) (HCC)   . COPD (chronic obstructive pulmonary disease) (HCC)   . Coronary artery disease    STEMI    Tobacco Use: Social History   Tobacco Use  Smoking Status Former Smoker  . Packs/day: 0.50  . Years: 47.00  . Pack years: 23.50  . Types: Cigarettes  . Quit date: 07/28/2020  . Years since quitting: 0.1  Smokeless Tobacco Never Used  Tobacco Comment   08/24/20 given quit smoking paperwork and encouraged continued cessation    Labs: Recent Review Flowsheet Data    Labs for ITP Cardiac and Pulmonary Rehab Latest Ref Rng & Units 05/03/2020 07/28/2020 07/29/2020 08/10/2020 09/21/2020   Cholestrol 0 - 200 mg/dL 646 - 290 094 461   LDLCALC 0 - 99 mg/dL 558(O) - 833(U) 61 54   HDL >40 mg/dL 40 - 33(G) 42 86(O)   Trlycerides <150 mg/dL 194 - 74 66 56   Hemoglobin A1c 4.8 - 5.6 % - - 5.2 - -   HCO3 20.0 - 28.0 mmol/L - 24.8 - - -   ACIDBASEDEF 0.0 - 2.0 mmol/L - 1.7 - - -   O2SAT % - 82.0 - - -       Exercise Target Goals: Exercise Program Goal: Individual exercise prescription set using results from initial 6 min walk test and THRR while considering  patient's activity barriers and  safety.   Exercise Prescription Goal: Initial exercise prescription builds to 30-45 minutes a day of aerobic activity, 2-3 days per week.  Home exercise guidelines will be given to patient during program as part of exercise prescription that the participant will acknowledge.   Education: Aerobic Exercise: - Group verbal and visual presentation on the components of exercise prescription. Introduces F.I.T.T principle from ACSM for exercise prescriptions.  Reviews F.I.T.T. principles of aerobic exercise including progression. Written material given at graduation. Flowsheet Row Cardiac Rehab from 09/14/2020 in [facility] Cardiac and Pulmonary Rehab  Education need identified 08/24/20  Date 08/31/20  Educator jh  Instruction Review Code 1- Verbalizes  Understanding      Education: Resistance Exercise: - Group verbal and visual presentation on the components of exercise prescription. Introduces F.I.T.T principle from ACSM for exercise prescriptions  Reviews F.I.T.T. principles of resistance exercise including progression. Written material given at graduation. Flowsheet Row Cardiac Rehab from 09/14/2020 in [facility] Cardiac and Pulmonary Rehab  Date 09/07/20  Educator [facility]  Instruction Review Code 1- [employer] Understanding       Education: Exercise & Equipment Safety: - Individual verbal instruction and demonstration of equipment use and safety with use of the equipment. Flowsheet Row Cardiac Rehab from 09/14/2020 in [facility] Cardiac and Pulmonary Rehab  Date 08/18/20  Educator [facility]  Instruction Review Code 1- Verbalizes Understanding      Education: Exercise Physiology & General Exercise Guidelines: - Group verbal and written instruction with models to review the exercise physiology of the cardiovascular system and associated critical values. Provides general exercise guidelines with specific guidelines to those with heart or lung disease.    Education: Flexibility, Balance, Mind/Body Relaxation: - Group verbal and visual presentation with interactive activity on the components of exercise prescription. Introduces F.I.T.T principle from ACSM for exercise prescriptions. Reviews F.I.T.T. principles of flexibility and balance exercise training including progression. Also discusses the mind body connection.  Reviews various relaxation techniques to help reduce and manage stress (i.e. Deep breathing, progressive muscle relaxation, and visualization). Balance handout provided to take home. Written material given at graduation. Flowsheet Row Cardiac Rehab from 09/14/2020 in [facility] Cardiac and Pulmonary Rehab  Date 09/14/20  Educator AS  Instruction Review Code 1- Verbalizes Understanding      Activity Barriers & Risk Stratification:  Activity Barriers &  Cardiac Risk Stratification - 08/24/20 1326      Activity Barriers & Cardiac Risk Stratification   Activity Barriers Muscular Weakness;Deconditioning;Shortness of Breath;Joint Problems;Other (comment)    Comments chronic hip pain from working, residual chest soreness from CPR    Cardiac Risk Stratification High           6 Minute Walk:  6 Minute Walk    Row Name 08/24/20 1325         6 Minute Walk   Phase Initial     Distance 1395 feet     Walk Time 6 minutes     # of Rest Breaks 0     MPH 2.64     METS 3.94     RPE 9     Perceived Dyspnea  1     VO2 Peak 13.8     Symptoms Yes (comment)     Comments chronic hip pain 4/10, SOB     Resting HR 71 bpm     Resting BP 142/70     Exercise Oxygen Saturation  during 6 min walk 97 %     Max Ex. HR 114 bpm  Max Ex. BP 166/84     2 Minute Post BP 124/76            Oxygen Initial Assessment:   Oxygen Re-Evaluation:   Oxygen Discharge (Final Oxygen Re-Evaluation):   Initial Exercise Prescription:  Initial Exercise Prescription - 08/24/20 1300      Date of Initial Exercise RX and Referring Provider   Date 08/24/20    Referring Provider Bartholome Bill MD      Treadmill   MPH 2.6    Grade 1    Minutes 15    METs 3.35      REL-XR   Level 2    Speed 50    Minutes 15    METs 3      T5 Nustep   Level 2    SPM 80    Minutes 15    METs 3      Prescription Details   Frequency (times per week) 2    Duration Progress to 30 minutes of continuous aerobic without signs/symptoms of physical distress      Intensity   THRR 40-80% of Max Heartrate 104-137    Ratings of Perceived Exertion 11-13    Perceived Dyspnea 0-4      Progression   Progression Continue to progress workloads to maintain intensity without signs/symptoms of physical distress.      Resistance Training   Training Prescription Yes    Weight 4 lb    Reps 10-15           Perform Capillary Blood Glucose checks as needed.  Exercise  Prescription Changes:  Exercise Prescription Changes    Row Name 08/24/20 1300 08/30/20 1500 09/12/20 1100 09/27/20 1600       Response to Exercise   Blood Pressure (Admit) 142/70 126/66 136/58 130/68    Blood Pressure (Exercise) 166/84 160/76 162/80 152/78    Blood Pressure (Exit) 124/76 104/68 122/64 134/68    Heart Rate (Admit) 71 bpm 76 bpm 76 bpm 59 bpm    Heart Rate (Exercise) 114 bpm 126 bpm 105 bpm 102 bpm    Heart Rate (Exit) 82 bpm 103 bpm 90 bpm 84 bpm    Oxygen Saturation (Exercise) 97 % -- -- --    Rating of Perceived Exertion (Exercise) 9 14 13 12     Perceived Dyspnea (Exercise) 1 -- -- --    Symptoms SOB, chronic hip pain 4/10 none none none    Comments walk test results first full day of exercise -- 3/15 medical hold for drain until April    Duration -- Progress to 30 minutes of  aerobic without signs/symptoms of physical distress Continue with 30 min of aerobic exercise without signs/symptoms of physical distress. Continue with 30 min of aerobic exercise without signs/symptoms of physical distress.    Intensity -- THRR unchanged THRR unchanged THRR unchanged         Progression   Progression -- Continue to progress workloads to maintain intensity without signs/symptoms of physical distress. Continue to progress workloads to maintain intensity without signs/symptoms of physical distress. Continue to progress workloads to maintain intensity without signs/symptoms of physical distress.    Average METs -- 3.23 3.96 3.04         Resistance Training   Training Prescription -- Yes Yes Yes    Weight -- 3 lb 3 lb 3 lb    Reps -- 10-15 10-15 10-15         Interval Training   Interval Training -- No  No No         Treadmill   MPH -- 2.6 -- 2    Grade -- 1 -- 2    Minutes -- 15 -- 15    METs -- 3.35 -- 3.08         Recumbant Bike   Level -- -- 3 --    Minutes -- -- 15 --    METs -- -- 3.12 --         NuStep   Level -- -- 6 --    SPM -- -- 80 --    Minutes -- --  15 --    METs -- -- 4.8 --         REL-XR   Level -- 3 -- --    Minutes -- 15 -- --    METs -- 3.1 -- --         T5 Nustep   Level -- -- -- 4    Minutes -- -- -- 15    METs -- -- -- 3         Home Exercise Plan   Plans to continue exercise at -- -- -- Home (comment)  walking, working, videos    Frequency -- -- -- Add 2 additional days to program exercise sessions.    Initial Home Exercises Provided -- -- -- 09/14/20           Exercise Comments:   Exercise Goals and Review:  Exercise Goals    Row Name 08/24/20 1328             Exercise Goals   Increase Physical Activity Yes       Intervention Provide advice, education, support and counseling about physical activity/exercise needs.;Develop an individualized exercise prescription for aerobic and resistive training based on initial evaluation findings, risk stratification, comorbidities and participant's personal goals.       Expected Outcomes Short Term: Attend rehab on a regular basis to increase amount of physical activity.;Long Term: Add in home exercise to make exercise part of routine and to increase amount of physical activity.;Long Term: Exercising regularly at least 3-5 days a week.       Increase Strength and Stamina Yes       Intervention Provide advice, education, support and counseling about physical activity/exercise needs.;Develop an individualized exercise prescription for aerobic and resistive training based on initial evaluation findings, risk stratification, comorbidities and participant's personal goals.       Expected Outcomes Short Term: Increase workloads from initial exercise prescription for resistance, speed, and METs.;Short Term: Perform resistance training exercises routinely during rehab and add in resistance training at home;Long Term: Improve cardiorespiratory fitness, muscular endurance and strength as measured by increased METs and functional capacity (6MWT)       Able to understand and use rate  of perceived exertion (RPE) scale Yes       Intervention Provide education and explanation on how to use RPE scale       Expected Outcomes Short Term: Able to use RPE daily in rehab to express subjective intensity level;Long Term:  Able to use RPE to guide intensity level when exercising independently       Able to understand and use Dyspnea scale Yes       Intervention Provide education and explanation on how to use Dyspnea scale       Expected Outcomes Short Term: Able to use Dyspnea scale daily in rehab to express subjective sense of shortness of breath during  exertion;Long Term: Able to use Dyspnea scale to guide intensity level when exercising independently       Knowledge and understanding of Target Heart Rate Range (THRR) Yes       Intervention Provide education and explanation of THRR including how the numbers were predicted and where they are located for reference       Expected Outcomes Short Term: Able to state/look up THRR;Short Term: Able to use daily as guideline for intensity in rehab;Long Term: Able to use THRR to govern intensity when exercising independently       Able to check pulse independently Yes       Intervention Provide education and demonstration on how to check pulse in carotid and radial arteries.;Review the importance of being able to check your own pulse for safety during independent exercise       Expected Outcomes Short Term: Able to explain why pulse checking is important during independent exercise;Long Term: Able to check pulse independently and accurately       Understanding of Exercise Prescription Yes       Intervention Provide education, explanation, and written materials on patient's individual exercise prescription       Expected Outcomes Short Term: Able to explain program exercise prescription;Long Term: Able to explain home exercise prescription to exercise independently              Exercise Goals Re-Evaluation :  Exercise Goals Re-Evaluation    Row  Name 08/29/20 1113 09/12/20 1139 09/27/20 0854         Exercise Goal Re-Evaluation   Exercise Goals Review Increase Physical Activity;Knowledge and understanding of Target Heart Rate Range (THRR);Able to understand and use rate of perceived exertion (RPE) scale;Understanding of Exercise Prescription;Increase Strength and Stamina;Able to check pulse independently Increase Physical Activity;Increase Strength and Stamina --     Comments Reviewed RPE and dyspnea scales, THR and program prescription with pt today.  Pt voiced understanding and was given a copy of goals to take home. Andrew Potts is doing well in rehab.  He has increased levels on machines and TM.  He reaches lower end of THR range.  We will continue to monitor progress. Andrew Potts is out of hospital but has a drain placed for his gallbladder that is external. We will hold is rehab until after his surgery in April.     Expected Outcomes Short: Use RPE daily to regulate intensity. Long: Follow program prescription in THR. Short:  continue to attend consistently  Long:  improve overall MET level Clearance to return to rehab.            Discharge Exercise Prescription (Final Exercise Prescription Changes):  Exercise Prescription Changes - 09/27/20 1600      Response to Exercise   Blood Pressure (Admit) 130/68    Blood Pressure (Exercise) 152/78    Blood Pressure (Exit) 134/68    Heart Rate (Admit) 59 bpm    Heart Rate (Exercise) 102 bpm    Heart Rate (Exit) 84 bpm    Rating of Perceived Exertion (Exercise) 12    Symptoms none    Comments 3/15 medical hold for drain until April    Duration Continue with 30 min of aerobic exercise without signs/symptoms of physical distress.    Intensity THRR unchanged      Progression   Progression Continue to progress workloads to maintain intensity without signs/symptoms of physical distress.    Average METs 3.04      Resistance Training   Training Prescription Yes  Weight 3 lb    Reps 10-15       Interval Training   Interval Training No      Treadmill   MPH 2    Grade 2    Minutes 15    METs 3.08      T5 Nustep   Level 4    Minutes 15    METs 3      Home Exercise Plan   Plans to continue exercise at Home (comment)   walking, working, videos   Frequency Add 2 additional days to program exercise sessions.    Initial Home Exercises Provided 09/14/20           Nutrition:  Target Goals: Understanding of nutrition guidelines, daily intake of sodium '1500mg'$ , cholesterol '200mg'$ , calories 30% from fat and 7% or less from saturated fats, daily to have 5 or more servings of fruits and vegetables.  Education: All About Nutrition: -Group instruction provided by verbal, written material, interactive activities, discussions, models, and posters to present general guidelines for heart healthy nutrition including fat, fiber, MyPlate, the role of sodium in heart healthy nutrition, utilization of the nutrition label, and utilization of this knowledge for meal planning. Follow up email sent as well. Written material given at graduation. Flowsheet Row Cardiac Rehab from 09/14/2020 in [facility] Cardiac and Pulmonary Rehab  Education need identified 08/24/20      Biometrics:  Pre Biometrics - 08/24/20 1329      Pre Biometrics   Height 5' 8.1" (1.73 m)    Weight 152 lb 3.2 oz (69 kg)    BMI (Calculated) 23.07    Single Leg Stand 28.11 seconds            Nutrition Therapy Plan and Nutrition Goals:  Nutrition Therapy & Goals - 09/07/20 0847      Nutrition Therapy   Diet Heart healthy, low Na    Drug/Food Interactions Statins/Certain Fruits    Protein (specify units) 55g    Fiber 30 grams    Whole Grain Foods 3 servings    Saturated Fats 12 max. grams    Fruits and Vegetables 8 servings/day    Sodium 1.5 grams      Personal Nutrition Goals   Nutrition Goal ST: add in 2 servings of non-starchy vegetables - broccoli and cauliflower LT: include a variety of non-starchy vegetables,  maintain changes already made    Comments B: cereal or oatmeal, grapes L: low sodium ham and swiss on white bread with low sodium potato chips D: sandwich and banana and some more grapes and canned peaches S: bowl of cereal (honey nut cheerios). He doesn't eat vegetables. He drinks a nutritional shake plus. Bell peppers and cucumbers give him agita. He has no teeth and needs softer foods - suggested steamed vegetables, sauteed vegetables, roasted vegetables, and vegetable soup. He used to eat fried eggs and sausage everyday - switched to cereal. Quit smoking recently.      Intervention Plan   Intervention Prescribe, educate and counsel regarding individualized specific dietary modifications aiming towards targeted core components such as weight, hypertension, lipid management, diabetes, heart failure and other comorbidities.    Expected Outcomes Short Term Goal: Understand basic principles of dietary content, such as calories, fat, sodium, cholesterol and nutrients.;Short Term Goal: A plan has been developed with personal nutrition goals set during dietitian appointment.;Long Term Goal: Adherence to prescribed nutrition plan.           Nutrition Assessments:  MEDIFICTS Score Key:  ?58  Need to make dietary changes   40-70 Heart Healthy Diet  ? 40 Therapeutic Level Cholesterol Diet  Flowsheet Row Cardiac Rehab from 08/24/2020 in [facility] Cardiac and Pulmonary Rehab  Picture Your Plate Total Score on Admission 57     Picture Your Plate Scores:  <20 Unhealthy dietary pattern with much room for improvement.  41-50 Dietary pattern unlikely to meet recommendations for good health and room for improvement.  51-60 More healthful dietary pattern, with some room for improvement.   >60 Healthy dietary pattern, although there may be some specific behaviors that could be improved.    Nutrition Goals Re-Evaluation:   Nutrition Goals Discharge (Final Nutrition Goals  Re-Evaluation):   Psychosocial: Target Goals: Acknowledge presence or absence of significant depression and/or stress, maximize coping skills, provide positive support system. Participant is able to verbalize types and ability to use techniques and skills needed for reducing stress and depression.   Education: Stress, Anxiety, and Depression - Group verbal and visual presentation to define topics covered.  Reviews how body is impacted by stress, anxiety, and depression.  Also discusses healthy ways to reduce stress and to treat/manage anxiety and depression.  Written material given at graduation.   Education: Sleep Hygiene -Provides group verbal and written instruction about how sleep can affect your health.  Define sleep hygiene, discuss sleep cycles and impact of sleep habits. Review good sleep hygiene tips.    Initial Review & Psychosocial Screening:  Initial Psych Review & Screening - 08/18/20 1009      Initial Review   Current issues with None Identified      Family Dynamics   Good Support System? Yes    Comments He talks to his daughter everyday. His boss is a great support system, works at [employer] and has been for 3 years. He has a positive outlook on his health.      Barriers   Psychosocial barriers to participate in program There are no identifiable barriers or psychosocial needs.;The patient should benefit from training in stress management and relaxation.           Quality of Life Scores:   Quality of Life - 08/24/20 1329      Quality of Life   Select Quality of Life      Quality of Life Scores   Health/Function Pre 22.5 %    Socioeconomic Pre 22.5 %    Psych/Spiritual Pre 22.5 %    Family Pre 22.5 %    GLOBAL Pre 22.5 %          Scores of 19 and below usually indicate a poorer quality of life in these areas.  A difference of  2-3 points is a clinically meaningful difference.  A difference of 2-3 points in the total score of the Quality of Life Index has been  associated with significant improvement in overall quality of life, self-image, physical symptoms, and general health in studies assessing change in quality of life.  PHQ-9: Recent Review Flowsheet Data    Depression screen [facility] 2/9 08/24/2020 08/10/2020 10/22/2019 03/10/2019 02/04/2019   Decreased Interest 0 0 0 0 0   Down, Depressed, Hopeless 0 0 0 0 0   PHQ - 2 Score 0 0 0 0 0   Altered sleeping 0 0 - 0 0   Tired, decreased energy 1 0 - 0 0   Change in appetite 0 0 - 0 0   Feeling bad or failure about yourself  0 0 - 0 0  Trouble concentrating 0 0 - 0 0   Moving slowly or fidgety/restless 0 0 - 0 0   Suicidal thoughts 0 0 - 0 0   PHQ-9 Score 1 0 - 0 0   Difficult doing work/chores Somewhat difficult - - Not difficult at all Not difficult at all     Interpretation of Total Score  Total Score Depression Severity:  1-4 = Minimal depression, 5-9 = Mild depression, 10-14 = Moderate depression, 15-19 = Moderately severe depression, 20-27 = Severe depression   Psychosocial Evaluation and Intervention:  Psychosocial Evaluation - 08/18/20 1014      Psychosocial Evaluation & Interventions   Interventions Encouraged to exercise with the program and follow exercise prescription;Relaxation education;Stress management education    Comments He talks to his daughter everyday. His boss is a great support system, works at [employer] and has been for 3 years. He has a positive outlook on his health.    Expected Outcomes Short: Exercise regularly to support mental health and notify staff of any changes. Long: maintain mental health and well being through teaching of rehab or prescribed medications independently.    Continue Psychosocial Services  Follow up required by staff           Psychosocial Re-Evaluation:   Psychosocial Discharge (Final Psychosocial Re-Evaluation):   Vocational Rehabilitation: Provide vocational rehab assistance to qualifying candidates.   Vocational Rehab Evaluation &  Intervention:   Education: Education Goals: Education classes will be provided on a variety of topics geared toward better understanding of heart health and risk factor modification. Participant will state understanding/return demonstration of topics presented as noted by education test scores.  Learning Barriers/Preferences:  Learning Barriers/Preferences - 08/18/20 1007      Learning Barriers/Preferences   Learning Barriers None    Learning Preferences None           General Cardiac Education Topics:  AED/CPR: - Group verbal and written instruction with the use of models to demonstrate the basic use of the AED with the basic ABC's of resuscitation.   Anatomy and Cardiac Procedures: - Group verbal and visual presentation and models provide information about basic cardiac anatomy and function. Reviews the testing methods done to diagnose heart disease and the outcomes of the test results. Describes the treatment choices: Medical Management, Angioplasty, or Coronary Bypass Surgery for treating various heart conditions including Myocardial Infarction, Angina, Valve Disease, and Cardiac Arrhythmias.  Written material given at graduation. Flowsheet Row Cardiac Rehab from 09/14/2020 in [facility] Cardiac and Pulmonary Rehab  Date 09/07/20  Educator [facility]  Instruction Review Code 1- Verbalizes Understanding      Medication Safety: - Group verbal and visual instruction to review commonly prescribed medications for heart and lung disease. Reviews the medication, class of the drug, and side effects. Includes the steps to properly store meds and maintain the prescription regimen.  Written material given at graduation.   Intimacy: - Group verbal instruction through game format to discuss how heart and lung disease can affect sexual intimacy. Written material given at graduation.. Flowsheet Row Cardiac Rehab from 09/14/2020 in [facility] Cardiac and Pulmonary Rehab  Date 08/31/20  Educator jh  Instruction  Review Code 1- Verbalizes Understanding      Know Your Numbers and Heart Failure: - Group verbal and visual instruction to discuss disease risk factors for cardiac and pulmonary disease and treatment options.  Reviews associated critical values for Overweight/Obesity, Hypertension, Cholesterol, and Diabetes.  Discusses basics of heart failure: signs/symptoms and treatments.  Introduces Heart  Failure Zone chart for action plan for heart failure.  Written material given at graduation.   Infection Prevention: - Provides verbal and written material to individual with discussion of infection control including proper hand washing and proper equipment cleaning during exercise session. Flowsheet Row Cardiac Rehab from 09/14/2020 in [facility] Cardiac and Pulmonary Rehab  Date 08/18/20  Educator [facility]  Instruction Review Code 1- Verbalizes Understanding      Falls Prevention: - Provides verbal and written material to individual with discussion of falls prevention and safety. Flowsheet Row Cardiac Rehab from 09/14/2020 in [facility] Cardiac and Pulmonary Rehab  Date 08/18/20  Educator [facility]  Instruction Review Code 1- Verbalizes Understanding      Other: -Provides group and verbal instruction on various topics (see comments)   Knowledge Questionnaire Score:  Knowledge Questionnaire Score - 08/24/20 1330      Knowledge Questionnaire Score   Pre Score 24/26 Education Focus: Exercise, nutrition           Core Components/Risk Factors/Patient Goals at Admission:  Personal Goals and Risk Factors at Admission - 08/24/20 1330      Core Components/Risk Factors/Patient Goals on Admission    Weight Management Yes;Weight Maintenance    Intervention Weight Management: Develop a combined nutrition and exercise program designed to reach desired caloric intake, while maintaining appropriate intake of nutrient and fiber, sodium and fats, and appropriate energy expenditure required for the weight goal.;Weight Management:  Provide education and appropriate resources to help participant work on and attain dietary goals.;Weight Management/Obesity: Establish reasonable short term and long term weight goals.    Admit Weight 152 lb 3.2 oz (69 kg)    Goal Weight: Short Term 152 lb (68.9 kg)    Goal Weight: Long Term 152 lb (68.9 kg)    Expected Outcomes Short Term: Continue to assess and modify interventions until short term weight is achieved;Long Term: Adherence to nutrition and physical activity/exercise program aimed toward attainment of established weight goal;Weight Maintenance: Understanding of the daily nutrition guidelines, which includes 25-35% calories from fat, 7% or less cal from saturated fats, less than $RemoveB'200mg'oakGEiLu$  cholesterol, less than 1.5gm of sodium, & 5 or more servings of fruits and vegetables daily    Tobacco Cessation Yes   Quit 07/28/2020   Number of packs per day 0    Intervention Assist the participant in steps to quit. Provide individualized education and counseling about committing to Tobacco Cessation, relapse prevention, and pharmacological support that can be provided by physician.;Advice worker, assist with locating and accessing local/national Quit Smoking programs, and support quit date choice.    Expected Outcomes Short Term: Will demonstrate readiness to quit, by selecting a quit date.;Short Term: Will quit all tobacco product use, adhering to prevention of relapse plan.;Long Term: Complete abstinence from all tobacco products for at least 12 months from quit date.    Lipids Yes    Intervention Provide education and support for participant on nutrition & aerobic/resistive exercise along with prescribed medications to achieve LDL '70mg'$ , HDL >$Remo'40mg'YzfCv$ .    Expected Outcomes Short Term: Participant states understanding of desired cholesterol values and is compliant with medications prescribed. Participant is following exercise prescription and nutrition guidelines.;Long Term: Cholesterol  controlled with medications as prescribed, with individualized exercise RX and with personalized nutrition plan. Value goals: LDL < $Rem'70mg'dCwV$ , HDL > 40 mg.           Education:Diabetes - Individual verbal and written instruction to review signs/symptoms of diabetes, desired ranges of glucose level fasting, after  meals and with exercise. Acknowledge that pre and post exercise glucose checks will be done for 3 sessions at entry of program.   Core Components/Risk Factors/Patient Goals Review:   Goals and Risk Factor Review    Row Name 08/24/20 1332             Core Components/Risk Factors/Patient Goals Review   Personal Goals Review Tobacco Cessation       Review Andrew Potts has recently quit tobacco use within the last 6 months. Intervention for relapse prevention was provided at the initial medical review. He was encouraged to continue to with tobacco cessation and was provided information on relapse prevention. Patient received information about combination therapy, tobacco cessation classes, quit line, and quit smoking apps in case of a relapse. Pt stated that he has had a few cravings but thinks back to his event and will not touch them.  Patient demonstrated understanding of this material.Staff will continue to provide encouragement and follow up with the patient throughout the program.       Expected Outcomes Continued cessation from tobacco products              Core Components/Risk Factors/Patient Goals at Discharge (Final Review):   Goals and Risk Factor Review - 08/24/20 1332      Core Components/Risk Factors/Patient Goals Review   Personal Goals Review Tobacco Cessation    Review Andrew Potts has recently quit tobacco use within the last 6 months. Intervention for relapse prevention was provided at the initial medical review. He was encouraged to continue to with tobacco cessation and was provided information on relapse prevention. Patient received information about combination therapy,  tobacco cessation classes, quit line, and quit smoking apps in case of a relapse. Pt stated that he has had a few cravings but thinks back to his event and will not touch them.  Patient demonstrated understanding of this material.Staff will continue to provide encouragement and follow up with the patient throughout the program.    Expected Outcomes Continued cessation from tobacco products           ITP Comments:  ITP Comments    Row Name 08/18/20 1004 08/24/20 1325 08/29/20 1113 09/07/20 0712 09/07/20 0922   ITP Comments Virtual Visit completed. Patient informed on EP and RD appointment and 6 Minute walk test. Patient also informed of patient health questionnaires on My Chart. Patient Verbalizes understanding. Visit diagnosis can be found in CHL1/13/2022. Completed 6MWT and gym orientation. Initial ITP created and sent for review to Dr. Emily Filbert, Medical Director. First full day of exercise!  Patient was oriented to gym and equipment including functions, settings, policies, and procedures.  Patient's individual exercise prescription and treatment plan were reviewed.  All starting workloads were established based on the results of the 6 minute walk test done at initial orientation visit.  The plan for exercise progression was also introduced and progression will be customized based on patient's performance and goals. 30 Day review completed. Medical Director ITP review done, changes made as directed, and signed approval by Medical Director.  New to program Completed initial RD evaluation   Row Name 09/27/20 0853 10/05/20 1125         ITP Comments Andrew Potts is out of hospital but has a drain placed for his gallbladder that is external.  We will hold is rehab until after his surgery in April. 30 Day review completed. Medical Director ITP review done, changes made as directed, and signed approval by Medical Director.  Comments:

## 2020-10-06 ENCOUNTER — Other Ambulatory Visit: Payer: Self-pay | Admitting: Nurse Practitioner

## 2020-10-06 LAB — POTASSIUM: Potassium: 4 mmol/L (ref 3.5–5.2)

## 2020-10-06 MED ORDER — LISINOPRIL 5 MG PO TABS: 5.0000 mg | ORAL_TABLET | Freq: Every day | ORAL | 4 refills | Status: DC

## 2020-10-06 NOTE — Progress Notes (Signed)
Please let [name] know his potassium has returned within normal range, dropped from 5.9 to now 4.0.  Continue all current medications and focus on lower potassium in diet.  Have a great day!!

## 2020-10-11 ENCOUNTER — Ambulatory Visit: Payer: Self-pay | Admitting: Surgery

## 2020-10-11 ENCOUNTER — Encounter: Payer: Medicare Other | Admitting: Surgery

## 2020-10-11 DIAGNOSIS — I700 Atherosclerosis of aorta: Secondary | ICD-10-CM | POA: Insufficient documentation

## 2020-10-11 DIAGNOSIS — I213 ST elevation (STEMI) myocardial infarction of unspecified site: Secondary | ICD-10-CM

## 2020-10-11 DIAGNOSIS — K8010 Calculus of gallbladder with chronic cholecystitis without obstruction: Secondary | ICD-10-CM

## 2020-10-12 ENCOUNTER — Telehealth: Payer: Self-pay

## 2020-10-12 NOTE — Telephone Encounter (Signed)
Faxed cardiac clearance to Dr. Fath @ [phone]. 

## 2020-10-13 NOTE — Progress Notes (Signed)
Cardiac Clearance has been received from Dr America Brown office. He is cleared at Low risk for surgery.

## 2020-10-21 ENCOUNTER — Other Ambulatory Visit: Payer: Self-pay

## 2020-10-21 ENCOUNTER — Telehealth: Payer: Self-pay | Admitting: Surgery

## 2020-10-21 ENCOUNTER — Encounter
Admission: RE | Admit: 2020-10-21 | Discharge: 2020-10-21 | Disposition: A | Discharge status: Home / Self Care | Payer: Medicare Other | Source: Ambulatory Visit | Attending: Surgery | Admitting: Surgery

## 2020-10-21 HISTORY — DX: Inflammatory liver disease, unspecified: K75.9

## 2020-10-21 HISTORY — DX: Acute myocardial infarction, unspecified: I21.9

## 2020-10-21 NOTE — Patient Instructions (Signed)
Your procedure is scheduled on: 10/31/20 Report to DAY SURGERY DEPARTMENT LOCATED ON 2ND FLOOR MEDICAL MALL ENTRANCE. To find out your arrival time please call [phone] between 1PM - 3PM on 10/28/20.  Remember: Instructions that are not followed completely may result in serious medical risk, up to and including death, or upon the discretion of your surgeon and anesthesiologist your surgery may need to be rescheduled.     _X__ 1. Do not eat food OR drink any liquids after midnight the night before your procedure.                 No gum chewing or hard candies.   __X__2.  On the morning of surgery brush your teeth with toothpaste and water, you                 may rinse your mouth with mouthwash if you wish.  Do not swallow any              toothpaste of mouthwash.     _X__ 3.  No Alcohol for 24 hours before or after surgery.   _X__ 4.  Do Not Smoke or use e-cigarettes For 24 Hours Prior to Your Surgery.                 Do not use any chewable tobacco products for at least 6 hours prior to                 surgery.  ____  5.  Bring all medications with you on the day of surgery if instructed.   __X__  6.  Notify your doctor if there is any change in your medical condition      (cold, fever, infections).     Do not wear jewelry, make-up, hairpins, clips or nail polish. Do not wear lotions, powders, deodorant or perfumes.  Do not shave 48 hours prior to surgery. Men may shave face and neck. Do not bring valuables to the hospital.    [facility] is not responsible for any belongings or valuables.  Contacts, dentures/partials or body piercings may not be worn into surgery. Bring a case for your contacts, glasses or hearing aids, a denture cup will be supplied. Leave your suitcase in the car. After surgery it may be brought to your room. For patients admitted to the hospital, discharge time is determined by your treatment team.   Patients discharged the day of surgery will not be  allowed to drive home.   Please read over the following fact sheets that you were given:   SAGE WIPES  __X__ Take these medicines the morning of surgery with A SIP OF WATER:    1. atorvastatin (LIPITOR) 80 MG tablet  2. isosorbide mononitrate (IMDUR) 30 MG 24 hr tablet  3. metoprolol tartrate (LOPRESSOR) 25 MG tablet  4. pantoprazole (PROTONIX) 40 MG tablet  5.  6.  ____ Fleet Enema (as directed)   __X__ Use CHG Soap/SAGE wipes as directed  __X__ Use inhalers on the day of surgery  ____ Stop metformin/Janumet/Farxiga 2 days prior to surgery    ____ Take 1/2 of usual insulin dose the night before surgery. No insulin the morning          of surgery.   __X__  Blood Thinners Coumadin/Plavix/Xarelto/Pleta/Pradaxa/Eliquis/Effient/Aspirin   Contact your Surgeon, Cardiologist or Medical Doctor regarding  ability to stop your blood thinners  __X__ Stop Anti-inflammatories 7 days before surgery such as Advil, Ibuprofen, Motrin,  BC or  Goodies Powder, Naprosyn, Naproxen, Aleve   __X__ Stop all herbal supplements, fish oil or vitamin E until after surgery.    ____ Bring C-Pap to the hospital.

## 2020-10-21 NOTE — Telephone Encounter (Signed)
Outbound call made to the pt, following Dr. Lajuan Lines call, explaining the necessity to postpone his scheduled cholecystectomy from 10/31/20, to some time in July, due to Dr. America Brown cardiac recommendations.    The pt is aware, although voicing his disappointment & frustration @ continuing to wear/deal w/the drain & bag for another 3 months; however, he has scheduled his 1st monthly follow up apt w/Dr. Claudine Mouton for 10/27/20.  Thank you

## 2020-10-24 ENCOUNTER — Encounter: Payer: Self-pay | Admitting: *Deleted

## 2020-10-24 DIAGNOSIS — I213 ST elevation (STEMI) myocardial infarction of unspecified site: Secondary | ICD-10-CM

## 2020-10-27 ENCOUNTER — Other Ambulatory Visit: Payer: Self-pay

## 2020-10-27 ENCOUNTER — Other Ambulatory Visit: Payer: Medicare Other

## 2020-10-27 ENCOUNTER — Encounter: Payer: Self-pay | Admitting: Surgery

## 2020-10-27 ENCOUNTER — Ambulatory Visit (INDEPENDENT_AMBULATORY_CARE_PROVIDER_SITE_OTHER): Payer: Medicare Other | Admitting: Surgery

## 2020-10-27 VITALS — BP 133/70 | HR 80 | Temp 98.3°F | Ht 67.0 in | Wt 153.0 lb

## 2020-10-27 DIAGNOSIS — Z434 Encounter for attention to other artificial openings of digestive tract: Secondary | ICD-10-CM

## 2020-10-27 NOTE — Progress Notes (Signed)
[facility] NOTE  10/27/2020  History of Present Illness: Andrew Potts is a 70 y.o. male who is currently living with his cholecystostomy tube and lieu of definitive surgery due to our inability to interrupt his Effient therapy for his stent.  He is doing great, drain is functioning well with clear bile.  Drain site is of little concern and is well secured.  He would like to resume working but has obvious obstacles with the drain and its logistics.  Past Medical History: Past Medical History:  Diagnosis Date  . Arthritis   . CHF (congestive heart failure) (HCC)   . COPD (chronic obstructive pulmonary disease) (HCC)   . Coronary artery disease    STEMI  . Hepatitis    Hep C  . Myocardial infarction [facility])    07/2020     Past Surgical History: Past Surgical History:  Procedure Laterality Date  . ABDOMINAL EXPLORATION SURGERY    . CORONARY/GRAFT ACUTE MI REVASCULARIZATION N/A 07/28/2020   Procedure: CORONARY/GRAFT ACUTE MI REVASCULARIZATION;  Surgeon: Alwyn Pea, MD;  Location: [facility] INVASIVE CV LAB;  Service: Cardiovascular;  Laterality: N/A;  . LEFT HEART CATH AND CORONARY ANGIOGRAPHY N/A 07/28/2020   Procedure: LEFT HEART CATH AND CORONARY ANGIOGRAPHY;  Surgeon: Alwyn Pea, MD;  Location: [facility] INVASIVE CV LAB;  Service: Cardiovascular;  Laterality: N/A;  . NASAL SINUS SURGERY      Home Medications: Prior to Admission medications   Medication Sig Start Date End Date Taking? Authorizing Provider  aspirin EC 81 MG EC tablet Take 1 tablet (81 mg total) by mouth daily. Swallow whole. 07/31/20  Yes Cipriano Bunker, MD  atorvastatin (LIPITOR) 80 MG tablet Take 1 tablet (80 mg total) by mouth daily. 07/31/20  Yes Cipriano Bunker, MD  Ensure (ENSURE) Take 237 mLs by mouth daily.   Yes [provider]  isosorbide mononitrate (IMDUR) 30 MG 24 hr tablet Take 1 tablet (30 mg total) by mouth daily. 09/23/20  Yes Arnetha Courser, MD  lisinopril  (ZESTRIL) 5 MG tablet Take 1 tablet (5 mg total) by mouth daily. 10/06/20  Yes [name], Jolene T, NP  metoprolol tartrate (LOPRESSOR) 25 MG tablet Take 0.5 tablets (12.5 mg total) by mouth 2 (two) times daily. 07/30/20  Yes Cipriano Bunker, MD  mometasone-formoterol [facility]) 200-5 MCG/ACT AERO Inhale 2 puffs into the lungs in the morning and at bedtime.   Yes [provider]  nitroGLYCERIN (NITROSTAT) 0.4 MG SL tablet Place 1 tablet (0.4 mg total) under the tongue every 5 (five) minutes as needed for chest pain. 07/30/20  Yes Cipriano Bunker, MD  pantoprazole (PROTONIX) 40 MG tablet Take 1 tablet (40 mg total) by mouth daily. 09/23/20  Yes Arnetha Courser, MD  prasugrel (EFFIENT) 10 MG TABS tablet Take 10 mg by mouth daily. 09/05/20  Yes [provider]  PROAIR HFA 108 (90 Base) MCG/ACT inhaler Inhale 2 puffs into the lungs every 4 (four) hours as needed. Patient taking differently: Inhale 2 puffs into the lungs every 4 (four) hours as needed for shortness of breath or wheezing. 06/24/20  Yes [name], Jolene T, NP  sildenafil (REVATIO) 20 MG tablet Take 20 mg by mouth daily as needed (erectile dysfunction).   Yes [provider]  SYMBICORT 160-4.5 MCG/ACT inhaler Inhale 2 puffs into the lungs 2 (two) times daily. 04/28/20  Yes [name], NP    Allergies: Allergies  Allergen Reactions  . Levaquin [Levofloxacin] Swelling  . Codeine  Abdominal pain, nose itching  . Codone [Hydrocodone] Other (See Comments)    Abdominal pain, felt loopy     Physical Exam BP 133/70   Pulse 80   Temp 98.3 F (36.8 C)   Ht 5\' 7"  (1.702 m)   Wt 153 lb (69.4 kg)   SpO2 93%   BMI 23.96 kg/m   Physical Exam Abdomen is soft and nontender. Labs/Imaging:   Assessment and Plan: As above.    Acute cholecystitis, resolved with cholecystostomy tube in place.  Awaiting safe surgery, deferring until July.   We discussed the pathophysiology of biliary disease.  And reviewed our game  plan. We will continue same and have him follow-up end of June. Face-to-face time spent with the patient and care providers was 15 minutes, with more than 50% of the time spent counseling, educating, and coordinating care of the patient.     July M.D., FACS 10/27/2020, 11:03 AM

## 2020-10-27 NOTE — Patient Instructions (Signed)
Follow up in June. Call with any questions.

## 2020-10-31 ENCOUNTER — Ambulatory Visit: Admission: RE | Admit: 2020-10-31 | Payer: Medicare Other | Source: Home / Self Care | Admitting: Surgery

## 2020-10-31 ENCOUNTER — Encounter: Admission: RE | Payer: Self-pay | Source: Home / Self Care

## 2020-10-31 SURGERY — CHOLECYSTECTOMY, ROBOT-ASSISTED, LAPAROSCOPIC
Anesthesia: General

## 2020-11-01 ENCOUNTER — Telehealth: Payer: Self-pay

## 2020-11-01 DIAGNOSIS — I213 ST elevation (STEMI) myocardial infarction of unspecified site: Secondary | ICD-10-CM

## 2020-11-01 NOTE — Telephone Encounter (Signed)
Yes, okay to have booster and highly recommend he obtain for prevention of severe illness.

## 2020-11-01 NOTE — Telephone Encounter (Signed)
PT verbalized understanding. 

## 2020-11-01 NOTE — Progress Notes (Signed)
Cardiac Individual Treatment Plan  Patient Details  Name: Andrew Potts MRN: [id number] Date of Birth: 1951-02-22 Referring Provider:   Flowsheet Row Cardiac Rehab from 08/24/2020 in [facility] Cardiac and Pulmonary Rehab  Referring Provider Bartholome Bill MD      Initial Encounter Date:  Flowsheet Row Cardiac Rehab from 08/24/2020 in [facility] Cardiac and Pulmonary Rehab  Date 08/24/20      Visit Diagnosis: ST elevation myocardial infarction (STEMI), unspecified artery ([place])  Patient's Home Medications on Admission:  Current Outpatient Medications:  .  aspirin EC 81 MG EC tablet, Take 1 tablet (81 mg total) by mouth daily. Swallow whole., Disp: 30 tablet, Rfl: 11 .  atorvastatin (LIPITOR) 80 MG tablet, Take 1 tablet (80 mg total) by mouth daily., Disp: 90 tablet, Rfl: 1 .  Ensure (ENSURE), Take 237 mLs by mouth daily., Disp: , Rfl:  .  isosorbide mononitrate (IMDUR) 30 MG 24 hr tablet, Take 1 tablet (30 mg total) by mouth daily., Disp: 30 tablet, Rfl: 0 .  lisinopril (ZESTRIL) 5 MG tablet, Take 1 tablet (5 mg total) by mouth daily., Disp: 90 tablet, Rfl: 4 .  metoprolol tartrate (LOPRESSOR) 25 MG tablet, Take 0.5 tablets (12.5 mg total) by mouth 2 (two) times daily., Disp: 60 tablet, Rfl: 1 .  mometasone-formoterol (DULERA) 200-5 MCG/ACT AERO, Inhale 2 puffs into the lungs in the morning and at bedtime., Disp: , Rfl:  .  nitroGLYCERIN (NITROSTAT) 0.4 MG SL tablet, Place 1 tablet (0.4 mg total) under the tongue every 5 (five) minutes as needed for chest pain., Disp: 30 tablet, Rfl: 12 .  pantoprazole (PROTONIX) 40 MG tablet, Take 1 tablet (40 mg total) by mouth daily., Disp: 90 tablet, Rfl: 0 .  prasugrel (EFFIENT) 10 MG TABS tablet, Take 10 mg by mouth daily., Disp: , Rfl:  .  PROAIR HFA 108 (90 Base) MCG/ACT inhaler, Inhale 2 puffs into the lungs every 4 (four) hours as needed. (Patient taking differently: Inhale 2 puffs into the lungs every 4 (four) hours as needed for shortness of breath or  wheezing.), Disp: 18 g, Rfl: 4 .  sildenafil (REVATIO) 20 MG tablet, Take 20 mg by mouth daily as needed (erectile dysfunction)., Disp: , Rfl:  .  SYMBICORT 160-4.5 MCG/ACT inhaler, Inhale 2 puffs into the lungs 2 (two) times daily., Disp: 10.2 g, Rfl: 5  Past Medical History: Past Medical History:  Diagnosis Date  . Arthritis   . CHF (congestive heart failure) ([place])   . COPD (chronic obstructive pulmonary disease) ([place])   . Coronary artery disease    STEMI  . Hepatitis    Hep C  . Myocardial infarction [facility])    07/2020    Tobacco Use: Social History   Tobacco Use  Smoking Status Former Smoker  . Packs/day: 0.50  . Years: 47.00  . Pack years: 23.50  . Types: Cigarettes  . Quit date: 07/28/2020  . Years since quitting: 0.2  Smokeless Tobacco Never Used  Tobacco Comment   08/24/20 given quit smoking paperwork and encouraged continued cessation    Labs: Recent Review Flowsheet Data    Labs for ITP Cardiac and Pulmonary Rehab Latest Ref Rng & Units 05/03/2020 07/28/2020 07/29/2020 08/10/2020 09/21/2020   Cholestrol 0 - 200 mg/dL 183 - 161 117 105   LDLCALC 0 - 99 mg/dL 122(H) - 106(H) 61 54   HDL >40 mg/dL 40 - 40(L) 42 40(L)   Trlycerides <150 mg/dL 117 - 74 66 56   Hemoglobin A1c 4.8 -  5.6 % - - 5.2 - -   HCO3 20.0 - 28.0 mmol/L - 24.8 - - -   ACIDBASEDEF 0.0 - 2.0 mmol/L - 1.7 - - -   O2SAT % - 82.0 - - -       Exercise Target Goals: Exercise Program Goal: Individual exercise prescription set using results from initial 6 min walk test and THRR while considering  patient's activity barriers and safety.   Exercise Prescription Goal: Initial exercise prescription builds to 30-45 minutes a day of aerobic activity, 2-3 days per week.  Home exercise guidelines will be given to patient during program as part of exercise prescription that the participant will acknowledge.   Education: Aerobic Exercise: - Group verbal and visual presentation on the components of exercise  prescription. Introduces F.I.T.T principle from ACSM for exercise prescriptions.  Reviews F.I.T.T. principles of aerobic exercise including progression. Written material given at graduation. Flowsheet Row Cardiac Rehab from 09/14/2020 in [facility] Cardiac and Pulmonary Rehab  Education need identified 08/24/20  Date 08/31/20  Educator jh  Instruction Review Code 1- Verbalizes Understanding      Education: Resistance Exercise: - Group verbal and visual presentation on the components of exercise prescription. Introduces F.I.T.T principle from ACSM for exercise prescriptions  Reviews F.I.T.T. principles of resistance exercise including progression. Written material given at graduation. Flowsheet Row Cardiac Rehab from 09/14/2020 in [facility] Cardiac and Pulmonary Rehab  Date 09/07/20  Educator [facility]  Instruction Review Code 1- [employer] Understanding       Education: Exercise & Equipment Safety: - Individual verbal instruction and demonstration of equipment use and safety with use of the equipment. Flowsheet Row Cardiac Rehab from 09/14/2020 in [facility] Cardiac and Pulmonary Rehab  Date 08/18/20  Educator [facility]  Instruction Review Code 1- Verbalizes Understanding      Education: Exercise Physiology & General Exercise Guidelines: - Group verbal and written instruction with models to review the exercise physiology of the cardiovascular system and associated critical values. Provides general exercise guidelines with specific guidelines to those with heart or lung disease.    Education: Flexibility, Balance, Mind/Body Relaxation: - Group verbal and visual presentation with interactive activity on the components of exercise prescription. Introduces F.I.T.T principle from ACSM for exercise prescriptions. Reviews F.I.T.T. principles of flexibility and balance exercise training including progression. Also discusses the mind body connection.  Reviews various relaxation techniques to help reduce and manage stress (i.e.  Deep breathing, progressive muscle relaxation, and visualization). Balance handout provided to take home. Written material given at graduation. Flowsheet Row Cardiac Rehab from 09/14/2020 in [facility] Cardiac and Pulmonary Rehab  Date 09/14/20  Educator AS  Instruction Review Code 1- Verbalizes Understanding      Activity Barriers & Risk Stratification:  Activity Barriers & Cardiac Risk Stratification - 08/24/20 1326      Activity Barriers & Cardiac Risk Stratification   Activity Barriers Muscular Weakness;Deconditioning;Shortness of Breath;Joint Problems;Other (comment)    Comments chronic hip pain from working, residual chest soreness from CPR    Cardiac Risk Stratification High           6 Minute Walk:  6 Minute Walk    Row Name 08/24/20 1325         6 Minute Walk   Phase Initial     Distance 1395 feet     Walk Time 6 minutes     # of Rest Breaks 0     MPH 2.64     METS 3.94     RPE 9  Perceived Dyspnea  1     VO2 Peak 13.8     Symptoms Yes (comment)     Comments chronic hip pain 4/10, SOB     Resting HR 71 bpm     Resting BP 142/70     Exercise Oxygen Saturation  during 6 min walk 97 %     Max Ex. HR 114 bpm     Max Ex. BP 166/84     2 Minute Post BP 124/76            Oxygen Initial Assessment:   Oxygen Re-Evaluation:   Oxygen Discharge (Final Oxygen Re-Evaluation):   Initial Exercise Prescription:  Initial Exercise Prescription - 08/24/20 1300      Date of Initial Exercise RX and Referring Provider   Date 08/24/20    Referring Provider Bartholome Bill MD      Treadmill   MPH 2.6    Grade 1    Minutes 15    METs 3.35      REL-XR   Level 2    Speed 50    Minutes 15    METs 3      T5 Nustep   Level 2    SPM 80    Minutes 15    METs 3      Prescription Details   Frequency (times per week) 2    Duration Progress to 30 minutes of continuous aerobic without signs/symptoms of physical distress      Intensity   THRR 40-80% of Max Heartrate  104-137    Ratings of Perceived Exertion 11-13    Perceived Dyspnea 0-4      Progression   Progression Continue to progress workloads to maintain intensity without signs/symptoms of physical distress.      Resistance Training   Training Prescription Yes    Weight 4 lb    Reps 10-15           Perform Capillary Blood Glucose checks as needed.  Exercise Prescription Changes:  Exercise Prescription Changes    Row Name 08/24/20 1300 08/30/20 1500 09/12/20 1100 09/27/20 1600       Response to Exercise   Blood Pressure (Admit) 142/70 126/66 136/58 130/68    Blood Pressure (Exercise) 166/84 160/76 162/80 152/78    Blood Pressure (Exit) 124/76 104/68 122/64 134/68    Heart Rate (Admit) 71 bpm 76 bpm 76 bpm 59 bpm    Heart Rate (Exercise) 114 bpm 126 bpm 105 bpm 102 bpm    Heart Rate (Exit) 82 bpm 103 bpm 90 bpm 84 bpm    Oxygen Saturation (Exercise) 97 % -- -- --    Rating of Perceived Exertion (Exercise) 9 14 13 12     Perceived Dyspnea (Exercise) 1 -- -- --    Symptoms SOB, chronic hip pain 4/10 none none none    Comments walk test results first full day of exercise -- 3/15 medical hold for drain until April    Duration -- Progress to 30 minutes of  aerobic without signs/symptoms of physical distress Continue with 30 min of aerobic exercise without signs/symptoms of physical distress. Continue with 30 min of aerobic exercise without signs/symptoms of physical distress.    Intensity -- THRR unchanged THRR unchanged THRR unchanged         Progression   Progression -- Continue to progress workloads to maintain intensity without signs/symptoms of physical distress. Continue to progress workloads to maintain intensity without signs/symptoms of physical distress. Continue to progress workloads  to maintain intensity without signs/symptoms of physical distress.    Average METs -- 3.23 3.96 3.04         Resistance Training   Training Prescription -- Yes Yes Yes    Weight -- 3 lb 3 lb 3  lb    Reps -- 10-15 10-15 10-15         Interval Training   Interval Training -- No No No         Treadmill   MPH -- 2.6 -- 2    Grade -- 1 -- 2    Minutes -- 15 -- 15    METs -- 3.35 -- 3.08         Recumbant Bike   Level -- -- 3 --    Minutes -- -- 15 --    METs -- -- 3.12 --         NuStep   Level -- -- 6 --    SPM -- -- 80 --    Minutes -- -- 15 --    METs -- -- 4.8 --         REL-XR   Level -- 3 -- --    Minutes -- 15 -- --    METs -- 3.1 -- --         T5 Nustep   Level -- -- -- 4    Minutes -- -- -- 15    METs -- -- -- 3         Home Exercise Plan   Plans to continue exercise at -- -- -- Home (comment)  walking, working, videos    Frequency -- -- -- Add 2 additional days to program exercise sessions.    Initial Home Exercises Provided -- -- -- 09/14/20           Exercise Comments:   Exercise Goals and Review:  Exercise Goals    Row Name 08/24/20 1328             Exercise Goals   Increase Physical Activity Yes       Intervention Provide advice, education, support and counseling about physical activity/exercise needs.;Develop an individualized exercise prescription for aerobic and resistive training based on initial evaluation findings, risk stratification, comorbidities and participant's personal goals.       Expected Outcomes Short Term: Attend rehab on a regular basis to increase amount of physical activity.;Long Term: Add in home exercise to make exercise part of routine and to increase amount of physical activity.;Long Term: Exercising regularly at least 3-5 days a week.       Increase Strength and Stamina Yes       Intervention Provide advice, education, support and counseling about physical activity/exercise needs.;Develop an individualized exercise prescription for aerobic and resistive training based on initial evaluation findings, risk stratification, comorbidities and participant's personal goals.       Expected Outcomes Short Term:  Increase workloads from initial exercise prescription for resistance, speed, and METs.;Short Term: Perform resistance training exercises routinely during rehab and add in resistance training at home;Long Term: Improve cardiorespiratory fitness, muscular endurance and strength as measured by increased METs and functional capacity (6MWT)       Able to understand and use rate of perceived exertion (RPE) scale Yes       Intervention Provide education and explanation on how to use RPE scale       Expected Outcomes Short Term: Able to use RPE daily in rehab to express subjective intensity level;Long Term:  Able to use RPE to guide intensity level when exercising independently       Able to understand and use Dyspnea scale Yes       Intervention Provide education and explanation on how to use Dyspnea scale       Expected Outcomes Short Term: Able to use Dyspnea scale daily in rehab to express subjective sense of shortness of breath during exertion;Long Term: Able to use Dyspnea scale to guide intensity level when exercising independently       Knowledge and understanding of Target Heart Rate Range (THRR) Yes       Intervention Provide education and explanation of THRR including how the numbers were predicted and where they are located for reference       Expected Outcomes Short Term: Able to state/look up THRR;Short Term: Able to use daily as guideline for intensity in rehab;Long Term: Able to use THRR to govern intensity when exercising independently       Able to check pulse independently Yes       Intervention Provide education and demonstration on how to check pulse in carotid and radial arteries.;Review the importance of being able to check your own pulse for safety during independent exercise       Expected Outcomes Short Term: Able to explain why pulse checking is important during independent exercise;Long Term: Able to check pulse independently and accurately       Understanding of Exercise  Prescription Yes       Intervention Provide education, explanation, and written materials on patient's individual exercise prescription       Expected Outcomes Short Term: Able to explain program exercise prescription;Long Term: Able to explain home exercise prescription to exercise independently              Exercise Goals Re-Evaluation :  Exercise Goals Re-Evaluation    Row Name 08/29/20 1113 09/12/20 1139 09/27/20 0854 10/24/20 1401       Exercise Goal Re-Evaluation   Exercise Goals Review Increase Physical Activity;Knowledge and understanding of Target Heart Rate Range (THRR);Able to understand and use rate of perceived exertion (RPE) scale;Understanding of Exercise Prescription;Increase Strength and Stamina;Able to check pulse independently Increase Physical Activity;Increase Strength and Stamina -- --    Comments Reviewed RPE and dyspnea scales, THR and program prescription with pt today.  Pt voiced understanding and was given a copy of goals to take home. Andrew Potts is doing well in rehab.  He has increased levels on machines and TM.  He reaches lower end of THR range.  We will continue to monitor progress. Andrew Potts is out of hospital but has a drain placed for his gallbladder that is external. We will hold is rehab until after his surgery in April. Out since last review    Expected Outcomes Short: Use RPE daily to regulate intensity. Long: Follow program prescription in THR. Short:  continue to attend consistently  Long:  improve overall MET level Clearance to return to rehab. --           Discharge Exercise Prescription (Final Exercise Prescription Changes):  Exercise Prescription Changes - 09/27/20 1600      Response to Exercise   Blood Pressure (Admit) 130/68    Blood Pressure (Exercise) 152/78    Blood Pressure (Exit) 134/68    Heart Rate (Admit) 59 bpm    Heart Rate (Exercise) 102 bpm    Heart Rate (Exit) 84 bpm    Rating of Perceived Exertion (Exercise) 12    Symptoms none  Comments 3/15 medical hold for drain until April    Duration Continue with 30 min of aerobic exercise without signs/symptoms of physical distress.    Intensity THRR unchanged      Progression   Progression Continue to progress workloads to maintain intensity without signs/symptoms of physical distress.    Average METs 3.04      Resistance Training   Training Prescription Yes    Weight 3 lb    Reps 10-15      Interval Training   Interval Training No      Treadmill   MPH 2    Grade 2    Minutes 15    METs 3.08      T5 Nustep   Level 4    Minutes 15    METs 3      Home Exercise Plan   Plans to continue exercise at Home (comment)   walking, working, videos   Frequency Add 2 additional days to program exercise sessions.    Initial Home Exercises Provided 09/14/20           Nutrition:  Target Goals: Understanding of nutrition guidelines, daily intake of sodium '1500mg'$ , cholesterol '200mg'$ , calories 30% from fat and 7% or less from saturated fats, daily to have 5 or more servings of fruits and vegetables.  Education: All About Nutrition: -Group instruction provided by verbal, written material, interactive activities, discussions, models, and posters to present general guidelines for heart healthy nutrition including fat, fiber, MyPlate, the role of sodium in heart healthy nutrition, utilization of the nutrition label, and utilization of this knowledge for meal planning. Follow up email sent as well. Written material given at graduation. Flowsheet Row Cardiac Rehab from 09/14/2020 in [facility] Cardiac and Pulmonary Rehab  Education need identified 08/24/20      Biometrics:  Pre Biometrics - 08/24/20 1329      Pre Biometrics   Height 5' 8.1" (1.73 m)    Weight 152 lb 3.2 oz (69 kg)    BMI (Calculated) 23.07    Single Leg Stand 28.11 seconds            Nutrition Therapy Plan and Nutrition Goals:  Nutrition Therapy & Goals - 09/07/20 0847      Nutrition Therapy   Diet  Heart healthy, low Na    Drug/Food Interactions Statins/Certain Fruits    Protein (specify units) 55g    Fiber 30 grams    Whole Grain Foods 3 servings    Saturated Fats 12 max. grams    Fruits and Vegetables 8 servings/day    Sodium 1.5 grams      Personal Nutrition Goals   Nutrition Goal ST: add in 2 servings of non-starchy vegetables - broccoli and cauliflower LT: include a variety of non-starchy vegetables, maintain changes already made    Comments B: cereal or oatmeal, grapes L: low sodium ham and swiss on white bread with low sodium potato chips D: sandwich and banana and some more grapes and canned peaches S: bowl of cereal (honey nut cheerios). He doesn't eat vegetables. He drinks a nutritional shake plus. Bell peppers and cucumbers give him agita. He has no teeth and needs softer foods - suggested steamed vegetables, sauteed vegetables, roasted vegetables, and vegetable soup. He used to eat fried eggs and sausage everyday - switched to cereal. Quit smoking recently.      Intervention Plan   Intervention Prescribe, educate and counsel regarding individualized specific dietary modifications aiming towards targeted core components such as weight,  hypertension, lipid management, diabetes, heart failure and other comorbidities.    Expected Outcomes Short Term Goal: Understand basic principles of dietary content, such as calories, fat, sodium, cholesterol and nutrients.;Short Term Goal: A plan has been developed with personal nutrition goals set during dietitian appointment.;Long Term Goal: Adherence to prescribed nutrition plan.           Nutrition Assessments:  MEDIFICTS Score Key:  ?70 Need to make dietary changes   40-70 Heart Healthy Diet  ? 40 Therapeutic Level Cholesterol Diet  Flowsheet Row Cardiac Rehab from 08/24/2020 in [facility] Cardiac and Pulmonary Rehab  Picture Your Plate Total Score on Admission 57     Picture Your Plate Scores:  <45 Unhealthy dietary pattern with  much room for improvement.  41-50 Dietary pattern unlikely to meet recommendations for good health and room for improvement.  51-60 More healthful dietary pattern, with some room for improvement.   >60 Healthy dietary pattern, although there may be some specific behaviors that could be improved.    Nutrition Goals Re-Evaluation:   Nutrition Goals Discharge (Final Nutrition Goals Re-Evaluation):   Psychosocial: Target Goals: Acknowledge presence or absence of significant depression and/or stress, maximize coping skills, provide positive support system. Participant is able to verbalize types and ability to use techniques and skills needed for reducing stress and depression.   Education: Stress, Anxiety, and Depression - Group verbal and visual presentation to define topics covered.  Reviews how body is impacted by stress, anxiety, and depression.  Also discusses healthy ways to reduce stress and to treat/manage anxiety and depression.  Written material given at graduation.   Education: Sleep Hygiene -Provides group verbal and written instruction about how sleep can affect your health.  Define sleep hygiene, discuss sleep cycles and impact of sleep habits. Review good sleep hygiene tips.    Initial Review & Psychosocial Screening:  Initial Psych Review & Screening - 08/18/20 1009      Initial Review   Current issues with None Identified      Family Dynamics   Good Support System? Yes    Comments He talks to his daughter everyday. His boss is a great support system, works at [employer] and has been for 3 years. He has a positive outlook on his health.      Barriers   Psychosocial barriers to participate in program There are no identifiable barriers or psychosocial needs.;The patient should benefit from training in stress management and relaxation.           Quality of Life Scores:   Quality of Life - 08/24/20 1329      Quality of Life   Select Quality of Life      Quality of  Life Scores   Health/Function Pre 22.5 %    Socioeconomic Pre 22.5 %    Psych/Spiritual Pre 22.5 %    Family Pre 22.5 %    GLOBAL Pre 22.5 %          Scores of 19 and below usually indicate a poorer quality of life in these areas.  A difference of  2-3 points is a clinically meaningful difference.  A difference of 2-3 points in the total score of the Quality of Life Index has been associated with significant improvement in overall quality of life, self-image, physical symptoms, and general health in studies assessing change in quality of life.  PHQ-9: Recent Review Flowsheet Data    Depression screen [facility] 2/9 08/24/2020 08/10/2020 10/22/2019 03/10/2019 02/04/2019   Decreased Interest  0 0 0 0 0   Down, Depressed, Hopeless 0 0 0 0 0   PHQ - 2 Score 0 0 0 0 0   Altered sleeping 0 0 - 0 0   Tired, decreased energy 1 0 - 0 0   Change in appetite 0 0 - 0 0   Feeling bad or failure about yourself  0 0 - 0 0   Trouble concentrating 0 0 - 0 0   Moving slowly or fidgety/restless 0 0 - 0 0   Suicidal thoughts 0 0 - 0 0   PHQ-9 Score 1 0 - 0 0   Difficult doing work/chores Somewhat difficult - - Not difficult at all Not difficult at all     Interpretation of Total Score  Total Score Depression Severity:  1-4 = Minimal depression, 5-9 = Mild depression, 10-14 = Moderate depression, 15-19 = Moderately severe depression, 20-27 = Severe depression   Psychosocial Evaluation and Intervention:  Psychosocial Evaluation - 08/18/20 1014      Psychosocial Evaluation & Interventions   Interventions Encouraged to exercise with the program and follow exercise prescription;Relaxation education;Stress management education    Comments He talks to his daughter everyday. His boss is a great support system, works at [employer] and has been for 3 years. He has a positive outlook on his health.    Expected Outcomes Short: Exercise regularly to support mental health and notify staff of any changes. Long: maintain mental  health and well being through teaching of rehab or prescribed medications independently.    Continue Psychosocial Services  Follow up required by staff           Psychosocial Re-Evaluation:   Psychosocial Discharge (Final Psychosocial Re-Evaluation):   Vocational Rehabilitation: Provide vocational rehab assistance to qualifying candidates.   Vocational Rehab Evaluation & Intervention:   Education: Education Goals: Education classes will be provided on a variety of topics geared toward better understanding of heart health and risk factor modification. Participant will state understanding/return demonstration of topics presented as noted by education test scores.  Learning Barriers/Preferences:  Learning Barriers/Preferences - 08/18/20 1007      Learning Barriers/Preferences   Learning Barriers None    Learning Preferences None           General Cardiac Education Topics:  AED/CPR: - Group verbal and written instruction with the use of models to demonstrate the basic use of the AED with the basic ABC's of resuscitation.   Anatomy and Cardiac Procedures: - Group verbal and visual presentation and models provide information about basic cardiac anatomy and function. Reviews the testing methods done to diagnose heart disease and the outcomes of the test results. Describes the treatment choices: Medical Management, Angioplasty, or Coronary Bypass Surgery for treating various heart conditions including Myocardial Infarction, Angina, Valve Disease, and Cardiac Arrhythmias.  Written material given at graduation. Flowsheet Row Cardiac Rehab from 09/14/2020 in [facility] Cardiac and Pulmonary Rehab  Date 09/07/20  Educator [facility]  Instruction Review Code 1- Verbalizes Understanding      Medication Safety: - Group verbal and visual instruction to review commonly prescribed medications for heart and lung disease. Reviews the medication, class of the drug, and side effects. Includes the steps to  properly store meds and maintain the prescription regimen.  Written material given at graduation.   Intimacy: - Group verbal instruction through game format to discuss how heart and lung disease can affect sexual intimacy. Written material given at graduation.. Flowsheet Row Cardiac Rehab from 09/14/2020 in  [place] [facility] and Pul[facility] Re[facility]  Date 08/31/20  Educator [facility]  Instruction Review Code 1- Verbalizes Understanding      Know Your Numbers and Heart Failure: - Group verbal and visual instruction to discuss disease risk factors for cardiac and pulmonary disease and treatment options.  Reviews associated critical values for Overweight/Obesity, Hypertension, Cholesterol, and Diabetes.  Discusses basics of heart failure: signs/symptoms and treatments.  Introduces Heart Failure Zone chart for action plan for heart failure.  Written material given at graduation.   Infection Prevention: - Provides verbal and written material to individual with discussion of infection control including proper hand washing and proper equipment cleaning during exercise session. Flowsheet Row Cardiac Rehab from 09/14/2020 in [facility] Cardiac and Pulmonary Rehab  Date 08/18/20  Educator [facility]  Instruction Review Code 1- Verbalizes Understanding      Falls Prevention: - Provides verbal and written material to individual with discussion of falls prevention and safety. Flowsheet Row Cardiac Rehab from 09/14/2020 in [facility] Cardiac and Pulmonary Rehab  Date 08/18/20  Educator [facility]  Instruction Review Code 1- Verbalizes Understanding      Other: -Provides group and verbal instruction on various topics (see comments)   Knowledge Questionnaire Score:  Knowledge Questionnaire Score - 08/24/20 1330      Knowledge Questionnaire Score   Pre Score 24/26 Education Focus: Exercise, nutrition           Core Components/Risk Factors/Patient Goals at Admission:  Personal Goals and Risk Factors at Admission - 08/24/20 1330       Core Components/Risk Factors/Patient Goals on Admission    Weight Management Yes;Weight Maintenance    Intervention Weight Management: Develop a combined nutrition and exercise program designed to reach desired caloric intake, while maintaining appropriate intake of nutrient and fiber, sodium and fats, and appropriate energy expenditure required for the weight goal.;Weight Management: Provide education and appropriate resources to help participant work on and attain dietary goals.;Weight Management/Obesity: Establish reasonable short term and long term weight goals.    Admit Weight 152 lb 3.2 oz (69 kg)    Goal Weight: Short Term 152 lb (68.9 kg)    Goal Weight: Long Term 152 lb (68.9 kg)    Expected Outcomes Short Term: Continue to assess and modify interventions until short term weight is achieved;Long Term: Adherence to nutrition and physical activity/exercise program aimed toward attainment of established weight goal;Weight Maintenance: Understanding of the daily nutrition guidelines, which includes 25-35% calories from fat, 7% or less cal from saturated fats, less than $RemoveB'200mg'GJqGNRAK$  cholesterol, less than 1.5gm of sodium, & 5 or more servings of fruits and vegetables daily    Tobacco Cessation Yes   Quit 07/28/2020   Number of packs per day 0    Intervention Assist the participant in steps to quit. Provide individualized education and counseling about committing to Tobacco Cessation, relapse prevention, and pharmacological support that can be provided by physician.;Advice worker, assist with locating and accessing local/national Quit Smoking programs, and support quit date choice.    Expected Outcomes Short Term: Will demonstrate readiness to quit, by selecting a quit date.;Short Term: Will quit all tobacco product use, adhering to prevention of relapse plan.;Long Term: Complete abstinence from all tobacco products for at least 12 months from quit date.    Lipids Yes    Intervention Provide  education and support for participant on nutrition & aerobic/resistive exercise along with prescribed medications to achieve LDL '70mg'$ , HDL >$Remo'40mg'Kjitp$ .    Expected Outcomes Short Term: Participant states understanding of  desired cholesterol values and is compliant with medications prescribed. Participant is following exercise prescription and nutrition guidelines.;Long Term: Cholesterol controlled with medications as prescribed, with individualized exercise RX and with personalized nutrition plan. Value goals: LDL < $Rem'70mg'wyoq$ , HDL > 40 mg.           Education:Diabetes - Individual verbal and written instruction to review signs/symptoms of diabetes, desired ranges of glucose level fasting, after meals and with exercise. Acknowledge that pre and post exercise glucose checks will be done for 3 sessions at entry of program.   Core Components/Risk Factors/Patient Goals Review:   Goals and Risk Factor Review    Row Name 08/24/20 1332             Core Components/Risk Factors/Patient Goals Review   Personal Goals Review Tobacco Cessation       Review Andrew Potts has recently quit tobacco use within the last 6 months. Intervention for relapse prevention was provided at the initial medical review. He was encouraged to continue to with tobacco cessation and was provided information on relapse prevention. Patient received information about combination therapy, tobacco cessation classes, quit line, and quit smoking apps in case of a relapse. Pt stated that he has had a few cravings but thinks back to his event and will not touch them.  Patient demonstrated understanding of this material.Staff will continue to provide encouragement and follow up with the patient throughout the program.       Expected Outcomes Continued cessation from tobacco products              Core Components/Risk Factors/Patient Goals at Discharge (Final Review):   Goals and Risk Factor Review - 08/24/20 1332      Core Components/Risk  Factors/Patient Goals Review   Personal Goals Review Tobacco Cessation    Review Andrew Potts has recently quit tobacco use within the last 6 months. Intervention for relapse prevention was provided at the initial medical review. He was encouraged to continue to with tobacco cessation and was provided information on relapse prevention. Patient received information about combination therapy, tobacco cessation classes, quit line, and quit smoking apps in case of a relapse. Pt stated that he has had a few cravings but thinks back to his event and will not touch them.  Patient demonstrated understanding of this material.Staff will continue to provide encouragement and follow up with the patient throughout the program.    Expected Outcomes Continued cessation from tobacco products           ITP Comments:  ITP Comments    Row Name 08/18/20 1004 08/24/20 1325 08/29/20 1113 09/07/20 0712 09/07/20 0922   ITP Comments Virtual Visit completed. Patient informed on EP and RD appointment and 6 Minute walk test. Patient also informed of patient health questionnaires on My Chart. Patient Verbalizes understanding. Visit diagnosis can be found in [facility]1/13/2022. Completed 6MWT and gym orientation. Initial ITP created and sent for review to Dr. Emily Filbert, Medical Director. First full day of exercise!  Patient was oriented to gym and equipment including functions, settings, policies, and procedures.  Patient's individual exercise prescription and treatment plan were reviewed.  All starting workloads were established based on the results of the 6 minute walk test done at initial orientation visit.  The plan for exercise progression was also introduced and progression will be customized based on patient's performance and goals. 30 Day review completed. Medical Director ITP review done, changes made as directed, and signed approval by Medical Director.  New to program Completed  initial RD evaluation   Row Name 09/27/20 0853  10/05/20 1125 10/11/20 1347 10/24/20 1400 11/01/20 1110   ITP Comments Andrew Potts is out of hospital but has a drain placed for his gallbladder that is external.  We will hold is rehab until after his surgery in April. 30 Day review completed. Medical Director ITP review done, changes made as directed, and signed approval by Medical Director. Andrew Potts has been out since last review due to other medical issues. Out since last review.  Surgery scheduled for 4/18 and will need clearance to return. Andrew Potts now has to wait until July to get surgery on his gallbladder due to blood thinners; will have to discharge at this time.          Comments: Discharge ITP

## 2020-11-01 NOTE — Progress Notes (Signed)
Discharge Progress Report  Patient Details  Name: Andrew Potts MRN: [id number] Date of Birth: 1951-07-09 Referring Provider:   Flowsheet Row Cardiac Rehab from 08/24/2020 in [facility] Cardiac and Pulmonary Rehab  Referring Provider Harold Hedge MD       Number of Visits: 10   Reason for Discharge:  Early Exit:  Personal  Smoking History:  Social History   Tobacco Use  Smoking Status Former Smoker  . Packs/day: 0.50  . Years: 47.00  . Pack years: 23.50  . Types: Cigarettes  . Quit date: 07/28/2020  . Years since quitting: 0.2  Smokeless Tobacco Never Used  Tobacco Comment   08/24/20 given quit smoking paperwork and encouraged continued cessation    Diagnosis:  ST elevation myocardial infarction (STEMI), unspecified artery (HCC)  ADL UCSD:   Initial Exercise Prescription:  Initial Exercise Prescription - 08/24/20 1300      Date of Initial Exercise RX and Referring Provider   Date 08/24/20    Referring Provider Harold Hedge MD      Treadmill   MPH 2.6    Grade 1    Minutes 15    METs 3.35      REL-XR   Level 2    Speed 50    Minutes 15    METs 3      T5 Nustep   Level 2    SPM 80    Minutes 15    METs 3      Prescription Details   Frequency (times per week) 2    Duration Progress to 30 minutes of continuous aerobic without signs/symptoms of physical distress      Intensity   THRR 40-80% of Max Heartrate 104-137    Ratings of Perceived Exertion 11-13    Perceived Dyspnea 0-4      Progression   Progression Continue to progress workloads to maintain intensity without signs/symptoms of physical distress.      Resistance Training   Training Prescription Yes    Weight 4 lb    Reps 10-15           Discharge Exercise Prescription (Final Exercise Prescription Changes):  Exercise Prescription Changes - 09/27/20 1600      Response to Exercise   Blood Pressure (Admit) 130/68    Blood Pressure (Exercise) 152/78    Blood Pressure (Exit) 134/68     Heart Rate (Admit) 59 bpm    Heart Rate (Exercise) 102 bpm    Heart Rate (Exit) 84 bpm    Rating of Perceived Exertion (Exercise) 12    Symptoms none    Comments 3/15 medical hold for drain until April    Duration Continue with 30 min of aerobic exercise without signs/symptoms of physical distress.    Intensity THRR unchanged      Progression   Progression Continue to progress workloads to maintain intensity without signs/symptoms of physical distress.    Average METs 3.04      Resistance Training   Training Prescription Yes    Weight 3 lb    Reps 10-15      Interval Training   Interval Training No      Treadmill   MPH 2    Grade 2    Minutes 15    METs 3.08      T5 Nustep   Level 4    Minutes 15    METs 3      Home Exercise Plan   Plans to continue exercise at Home (  comment)   walking, working, videos   Frequency Add 2 additional days to program exercise sessions.    Initial Home Exercises Provided 09/14/20           Functional Capacity:  6 Minute Walk    Row Name 08/24/20 1325         6 Minute Walk   Phase Initial     Distance 1395 feet     Walk Time 6 minutes     # of Rest Breaks 0     MPH 2.64     METS 3.94     RPE 9     Perceived Dyspnea  1     VO2 Peak 13.8     Symptoms Yes (comment)     Comments chronic hip pain 4/10, SOB     Resting HR 71 bpm     Resting BP 142/70     Exercise Oxygen Saturation  during 6 min walk 97 %     Max Ex. HR 114 bpm     Max Ex. BP 166/84     2 Minute Post BP 124/76            Psychological, QOL, Others - Outcomes: PHQ 2/9: Depression screen [facility] 2/9 08/24/2020 08/10/2020 10/22/2019 03/10/2019 02/04/2019  Decreased Interest 0 0 0 0 0  Down, Depressed, Hopeless 0 0 0 0 0  PHQ - 2 Score 0 0 0 0 0  Altered sleeping 0 0 - 0 0  Tired, decreased energy 1 0 - 0 0  Change in appetite 0 0 - 0 0  Feeling bad or failure about yourself  0 0 - 0 0  Trouble concentrating 0 0 - 0 0  Moving slowly or fidgety/restless 0 0 - 0 0   Suicidal thoughts 0 0 - 0 0  PHQ-9 Score 1 0 - 0 0  Difficult doing work/chores Somewhat difficult - - Not difficult at all Not difficult at all    Quality of Life:  Quality of Life - 08/24/20 1329      Quality of Life   Select Quality of Life      Quality of Life Scores   Health/Function Pre 22.5 %    Socioeconomic Pre 22.5 %    Psych/Spiritual Pre 22.5 %    Family Pre 22.5 %    GLOBAL Pre 22.5 %            Nutrition & Weight - Outcomes:  Pre Biometrics - 08/24/20 1329      Pre Biometrics   Height 5' 8.1" (1.73 m)    Weight 152 lb 3.2 oz (69 kg)    BMI (Calculated) 23.07    Single Leg Stand 28.11 seconds            Nutrition:  Nutrition Therapy & Goals - 09/07/20 0847      Nutrition Therapy   Diet Heart healthy, low Na    Drug/Food Interactions Statins/Certain Fruits    Protein (specify units) 55g    Fiber 30 grams    Whole Grain Foods 3 servings    Saturated Fats 12 max. grams    Fruits and Vegetables 8 servings/day    Sodium 1.5 grams      Personal Nutrition Goals   Nutrition Goal ST: add in 2 servings of non-starchy vegetables - broccoli and cauliflower LT: include a variety of non-starchy vegetables, maintain changes already made    Comments B: cereal or oatmeal, grapes L: low sodium ham and swiss  on white bread with low sodium potato chips D: sandwich and banana and some more grapes and canned peaches S: bowl of cereal (honey nut cheerios). He doesn't eat vegetables. He drinks a nutritional shake plus. Bell peppers and cucumbers give him agita. He has no teeth and needs softer foods - suggested steamed vegetables, sauteed vegetables, roasted vegetables, and vegetable soup. He used to eat fried eggs and sausage everyday - switched to cereal. Quit smoking recently.      Intervention Plan   Intervention Prescribe, educate and counsel regarding individualized specific dietary modifications aiming towards targeted core components such as weight, hypertension,  lipid management, diabetes, heart failure and other comorbidities.    Expected Outcomes Short Term Goal: Understand basic principles of dietary content, such as calories, fat, sodium, cholesterol and nutrients.;Short Term Goal: A plan has been developed with personal nutrition goals set during dietitian appointment.;Long Term Goal: Adherence to prescribed nutrition plan.           Nutrition Discharge:   Education Questionnaire Score:  Knowledge Questionnaire Score - 08/24/20 1330      Knowledge Questionnaire Score   Pre Score 24/26 Education Focus: Exercise, nutrition           Goals reviewed with patient; copy given to patient.

## 2020-11-01 NOTE — Telephone Encounter (Signed)
PT States he would like to know if it is ok for him to get the covid booster as he is on a few medications and he would just like to check with his PCP first. Please advise.

## 2020-11-04 ENCOUNTER — Emergency Department
Admission: EM | Admit: 2020-11-04 | Discharge: 2020-11-04 | Disposition: A | Discharge status: Home / Self Care | Payer: Medicare Other | Attending: Emergency Medicine | Admitting: Emergency Medicine

## 2020-11-04 ENCOUNTER — Other Ambulatory Visit: Payer: Self-pay

## 2020-11-04 DIAGNOSIS — T85590A Other mechanical complication of bile duct prosthesis, initial encounter: Secondary | ICD-10-CM | POA: Diagnosis not present

## 2020-11-04 DIAGNOSIS — I2510 Atherosclerotic heart disease of native coronary artery without angina pectoris: Secondary | ICD-10-CM | POA: Diagnosis not present

## 2020-11-04 DIAGNOSIS — I509 Heart failure, unspecified: Secondary | ICD-10-CM | POA: Insufficient documentation

## 2020-11-04 DIAGNOSIS — J449 Chronic obstructive pulmonary disease, unspecified: Secondary | ICD-10-CM | POA: Insufficient documentation

## 2020-11-04 DIAGNOSIS — T859XXA Unspecified complication of internal prosthetic device, implant and graft, initial encounter: Secondary | ICD-10-CM | POA: Diagnosis not present

## 2020-11-04 DIAGNOSIS — Y733 Surgical instruments, materials and gastroenterology and urology devices (including sutures) associated with adverse incidents: Secondary | ICD-10-CM | POA: Insufficient documentation

## 2020-11-04 DIAGNOSIS — Z7982 Long term (current) use of aspirin: Secondary | ICD-10-CM | POA: Insufficient documentation

## 2020-11-04 DIAGNOSIS — Z87891 Personal history of nicotine dependence: Secondary | ICD-10-CM | POA: Diagnosis not present

## 2020-11-04 DIAGNOSIS — T85518A Breakdown (mechanical) of other gastrointestinal prosthetic devices, implants and grafts, initial encounter: Secondary | ICD-10-CM

## 2020-11-04 DIAGNOSIS — Z7951 Long term (current) use of inhaled steroids: Secondary | ICD-10-CM | POA: Diagnosis not present

## 2020-11-04 NOTE — ED Notes (Signed)
Pt presents to the ED. Pt recently had a biliary tube placed and states that it has a foul smell. Pt requesting the bag to be changed. Pt A&Ox4 and NAD.

## 2020-11-04 NOTE — ED Provider Notes (Signed)
[facility] Emergency Department Provider Note  ____________________________________________  Time seen: Approximately 10:53 AM  I have reviewed the triage vital signs and the nursing notes.   HISTORY  Chief Complaint drainage problem    HPI Andrew Potts is a 70 y.o. male with a history of CHF COPD STEMI in January 2022 and cholecystitis in March 2022 who comes ED complaining of foul smell from his cholecystostomy drainage collection bag.  Denies abdominal pain fevers chills nausea or vomiting.  No skin itching or yellowing.  Otherwise feels totally normal.  Went to his surgery clinic to ask them for a new bag but he reports they were unable to help him with this.  Denies any other acute complaints.    Past Medical History:  Diagnosis Date  . Arthritis   . CHF (congestive heart failure) (HCC)   . COPD (chronic obstructive pulmonary disease) (HCC)   . Coronary artery disease    STEMI  . Hepatitis    Hep C  . Myocardial infarction [facility])    07/2020     Patient Active Problem List   Diagnosis Date Noted  . Aortic atherosclerosis (HCC) 10/11/2020  . Cholecystostomy care (HCC) 09/29/2020  . Senile purpura (HCC) 09/28/2020  . Cholecystitis 09/21/2020  . Unstable angina (HCC) 09/20/2020  . CAD, multiple vessel 09/20/2020  . Ischemic cardiomyopathy 09/20/2020  . STEMI involving right coronary artery (HCC) 07/28/2020  . HLD (hyperlipidemia) 07/28/2020  . Chronic active hepatitis (HCC) 03/11/2019  . Erectile dysfunction 02/04/2019  . COPD (chronic obstructive pulmonary disease) (HCC) 02/04/2019  . Nicotine dependence, cigarettes, w unsp disorders 02/04/2019  . White coat syndrome without diagnosis of hypertension 02/04/2019     Past Surgical History:  Procedure Laterality Date  . ABDOMINAL EXPLORATION SURGERY    . CORONARY/GRAFT ACUTE MI REVASCULARIZATION N/A 07/28/2020   Procedure: CORONARY/GRAFT ACUTE MI REVASCULARIZATION;  Surgeon: Alwyn Pea, MD;  Location: [facility] INVASIVE CV LAB;  Service: Cardiovascular;  Laterality: N/A;  . LEFT HEART CATH AND CORONARY ANGIOGRAPHY N/A 07/28/2020   Procedure: LEFT HEART CATH AND CORONARY ANGIOGRAPHY;  Surgeon: Alwyn Pea, MD;  Location: [facility] INVASIVE CV LAB;  Service: Cardiovascular;  Laterality: N/A;  . NASAL SINUS SURGERY       Prior to Admission medications   Medication Sig Start Date End Date Taking? Authorizing Provider  aspirin EC 81 MG EC tablet Take 1 tablet (81 mg total) by mouth daily. Swallow whole. 07/31/20   Cipriano Bunker, MD  atorvastatin (LIPITOR) 80 MG tablet Take 1 tablet (80 mg total) by mouth daily. 07/31/20   Cipriano Bunker, MD  Ensure (ENSURE) Take 237 mLs by mouth daily.    [provider]  isosorbide mononitrate (IMDUR) 30 MG 24 hr tablet Take 1 tablet (30 mg total) by mouth daily. 09/23/20   Arnetha Courser, MD  lisinopril (ZESTRIL) 5 MG tablet Take 1 tablet (5 mg total) by mouth daily. 10/06/20   [name], Corrie Dandy T, NP  metoprolol tartrate (LOPRESSOR) 25 MG tablet Take 0.5 tablets (12.5 mg total) by mouth 2 (two) times daily. 07/30/20   Cipriano Bunker, MD  mometasone-formoterol [facility]) 200-5 MCG/ACT AERO Inhale 2 puffs into the lungs in the morning and at bedtime.    [provider]  nitroGLYCERIN (NITROSTAT) 0.4 MG SL tablet Place 1 tablet (0.4 mg total) under the tongue every 5 (five) minutes as needed for chest pain. 07/30/20   Cipriano Bunker, MD  pantoprazole (PROTONIX) 40 MG tablet Take 1 tablet (40 mg  total) by mouth daily. 09/23/20   Arnetha Courser, MD  prasugrel (EFFIENT) 10 MG TABS tablet Take 10 mg by mouth daily. 09/05/20   [provider]  PROAIR HFA 108 (90 Base) MCG/ACT inhaler Inhale 2 puffs into the lungs every 4 (four) hours as needed. Patient taking differently: Inhale 2 puffs into the lungs every 4 (four) hours as needed for shortness of breath or wheezing. 06/24/20   [name], Corrie Dandy T, NP  sildenafil (REVATIO) 20 MG tablet  Take 20 mg by mouth daily as needed (erectile dysfunction).    [provider]  SYMBICORT 160-4.5 MCG/ACT inhaler Inhale 2 puffs into the lungs 2 (two) times daily. 04/28/20   [name], NP     Allergies Levaquin [levofloxacin], Codeine, and Codone [hydrocodone]   Family History  Problem Relation Age of Onset  . Alcohol abuse Father   . Alcohol abuse Brother   . Aneurysm Brother     Social History Social History   Tobacco Use  . Smoking status: Former Smoker    Packs/day: 0.50    Years: 47.00    Pack years: 23.50    Types: Cigarettes    Quit date: 07/28/2020    Years since quitting: 0.2  . Smokeless tobacco: Never Used  . Tobacco comment: 08/24/20 given quit smoking paperwork and encouraged continued cessation  Vaping Use  . Vaping Use: Never used  Substance Use Topics  . Alcohol use: Not Currently  . Drug use: Never    Review of Systems  Constitutional:   No fever or chills.  ENT:   No sore throat. No rhinorrhea. Cardiovascular:   No chest pain or syncope. Respiratory:   No dyspnea or cough. Gastrointestinal:   Negative for abdominal pain, vomiting and diarrhea.  Musculoskeletal:   Negative for focal pain or swelling All other systems reviewed and are negative except as documented above in ROS and HPI.  ____________________________________________   PHYSICAL EXAM:  VITAL SIGNS: ED Triage Vitals  Enc Vitals Group     BP 11/04/20 1017 (!) 168/109     Pulse Rate 11/04/20 1017 70     Resp 11/04/20 1017 18     Temp 11/04/20 1017 97.6 F (36.4 C)     Temp Source 11/04/20 1017 Oral     SpO2 11/04/20 1017 95 %     Weight 11/04/20 1016 152 lb 1.9 oz (69 kg)     Height 11/04/20 1016 5\' 7"  (1.702 m)     Head Circumference --      Peak Flow --      Pain Score 11/04/20 1015 0     Pain Loc --      Pain Edu? --      Excl. in GC? --     Vital signs reviewed, nursing assessments reviewed.   Constitutional:   Alert and oriented. Non-toxic  appearance. Eyes:   Conjunctivae are normal, no icterus. EOMI. PERRL. ENT      Head:   Normocephalic and atraumatic.      Nose:   Wearing a mask.      Mouth/Throat:   Wearing a mask.      Neck:   No meningismus. Full ROM. Hematological/Lymphatic/Immunilogical:   No cervical lymphadenopathy. Cardiovascular:   RRR. Symmetric bilateral radial and DP pulses.  No murmurs. Cap refill less than 2 seconds. Respiratory:   Normal respiratory effort without tachypnea/retractions. Breath sounds are clear and equal bilaterally. No wheezes/rales/rhonchi. Gastrointestinal:   Soft and nontender. Non distended. There is  no CVA tenderness.  No rebound, rigidity, or guarding.  Cholecystostomy exit site on right abdomen not inflamed, anchor sutures in place.  Musculoskeletal:   Normal range of motion in all extremities. No joint effusions.  No lower extremity tenderness.  No edema. Neurologic:   Normal speech and language.  Motor grossly intact. No acute focal neurologic deficits are appreciated.  Skin:    Skin is warm, dry and intact. No rash noted.  No petechiae, purpura, or bullae.  No jaundice or excoriations  ____________________________________________    LABS (pertinent positives/negatives) (all labs ordered are listed, but only abnormal results are displayed) Labs Reviewed - No data to display ____________________________________________   EKG    ____________________________________________    RADIOLOGY  No results found.  ____________________________________________   PROCEDURES Procedures  ____________________________________________    CLINICAL IMPRESSION / ASSESSMENT AND PLAN / ED COURSE  Medications ordered in the ED: Medications - No data to display  Pertinent labs & imaging results that were available during my care of the patient were reviewed by me and considered in my medical decision making (see chart for details).  Andrew Potts was evaluated in Emergency  Department on 11/04/2020 for the symptoms described in the history of present illness. He was evaluated in the context of the global COVID-19 pandemic, which necessitated consideration that the patient might be at risk for infection with the SARS-CoV-2 virus that causes COVID-19. Institutional protocols and algorithms that pertain to the evaluation of patients at risk for COVID-19 are in a state of rapid change based on information released by regulatory bodies including the CDC and federal and state organizations. These policies and algorithms were followed during the patient's care in the ED.     Clinical Course as of 11/04/20 1113  Fri Nov 04, 2020  1029 Patient comes the ED requesting change of his percutaneous cholecystostomy collection bag due to foul smell.  Patient refuses labs.  No fevers pain or other new complaints.  Vital signs and exam are normal. [PS]  1112 New collection bag placed.  Stopcock added to circuit.  Patient educated on how to flush and change components sterilely if needed.  Circuit is draining well.  Patient is ambulatory, stable for discharge [PS]    Clinical Course User Index [PS] Sharman Cheek, MD     ____________________________________________   FINAL CLINICAL IMPRESSION(S) / ED DIAGNOSES    Final diagnoses:  Cholecystostomy tube dysfunction, initial encounter     ED Discharge Orders    None      Portions of this note were generated with dragon dictation software. Dictation errors may occur despite best attempts at proofreading.   Sharman Cheek, MD 11/04/20 1056

## 2020-11-04 NOTE — ED Triage Notes (Signed)
Pt to ED POV for problems with gallbladder drainage bag problem. Pt states it smells bad Dr Scotty Court at bedside

## 2020-11-04 NOTE — ED Notes (Signed)
Assigned as Primary RN at this time. Patient is lying comfortably and in no acute distress. Awaiting possible discharge. Will continue to monitor and assess.

## 2020-11-10 ENCOUNTER — Ambulatory Visit: Payer: Medicare Other | Admitting: Nurse Practitioner

## 2020-11-11 ENCOUNTER — Other Ambulatory Visit: Payer: Self-pay

## 2020-11-11 ENCOUNTER — Ambulatory Visit (INDEPENDENT_AMBULATORY_CARE_PROVIDER_SITE_OTHER): Payer: Medicare Other | Admitting: Nurse Practitioner

## 2020-11-11 ENCOUNTER — Encounter: Payer: Self-pay | Admitting: Nurse Practitioner

## 2020-11-11 VITALS — BP 130/74 | HR 60 | Temp 98.0°F | Wt 157.4 lb

## 2020-11-11 DIAGNOSIS — I10 Essential (primary) hypertension: Secondary | ICD-10-CM

## 2020-11-11 DIAGNOSIS — I700 Atherosclerosis of aorta: Secondary | ICD-10-CM | POA: Diagnosis not present

## 2020-11-11 DIAGNOSIS — D692 Other nonthrombocytopenic purpura: Secondary | ICD-10-CM | POA: Diagnosis not present

## 2020-11-11 DIAGNOSIS — I2111 ST elevation (STEMI) myocardial infarction involving right coronary artery: Secondary | ICD-10-CM

## 2020-11-11 DIAGNOSIS — F17219 Nicotine dependence, cigarettes, with unspecified nicotine-induced disorders: Secondary | ICD-10-CM

## 2020-11-11 DIAGNOSIS — Z434 Encounter for attention to other artificial openings of digestive tract: Secondary | ICD-10-CM

## 2020-11-11 DIAGNOSIS — I200 Unstable angina: Secondary | ICD-10-CM

## 2020-11-11 NOTE — Assessment & Plan Note (Signed)
I have recommended complete cessation of tobacco use. I have discussed various options available for assistance with tobacco cessation including over the counter methods (Nicotine gum, patch and lozenges). We also discussed prescription options (Chantix, Nicotine Inhaler / Nasal Spray). The patient is not interested in pursuing any prescription tobacco cessation options at this time.  

## 2020-11-11 NOTE — Assessment & Plan Note (Signed)
Noted on past imaging.  Recommend he continue daily statin and ASA for prevention + complete cessation of smoking. 

## 2020-11-11 NOTE — Patient Instructions (Signed)

## 2020-11-11 NOTE — Assessment & Plan Note (Signed)
Assessed tube today, intact and flushes well.  Continue collaboration with general surgery.  Will work on ordering supplies for him at home and home health order in to assist with care at this time.

## 2020-11-11 NOTE — Assessment & Plan Note (Signed)
Ongoing with recent MI, at this time will continue collaboration with cardiology and medication regimen as prescribed by them. 

## 2020-11-11 NOTE — Assessment & Plan Note (Signed)
Stable at this time, event took place 07/28/20.  Recommend he continue all current medications and collaboration with cardiology.  Continue cardiac rehab once able to return -- currently awaiting general surgery follow-up. 

## 2020-11-11 NOTE — Assessment & Plan Note (Signed)
As evidenced by bruising upper extremities with ASA use.  Recommend gentle skin care and monitor for wounds, if present immediately notify provider. 

## 2020-11-11 NOTE — Assessment & Plan Note (Signed)
Stable with BP at goal today.  Recommend he monitor BP at home three mornings a week and document for provider, educated him on this.  Continue diet and exercise focus, DASH diet.  Labs next visit.  Continue current medication regimen and collaboration with cardiology.

## 2020-11-11 NOTE — Progress Notes (Signed)
BP 130/74   Pulse 60   Temp 98 F (36.7 C) (Oral)   Wt 157 lb 6.4 oz (71.4 kg)   SpO2 96%   BMI 24.65 kg/m    Subjective:    Patient ID: Andrew Potts, male    DOB: 05/22/1951, 70 y.o.   MRN: [id number]  HPI: Andrew Potts is a 70 y.o. male  Chief Complaint  Patient presents with  . Colostomy Bag     Patient states he is here to discuss instructions about how to clean and use the sodium chloride to clean the tubing.   HYPERTENSION Continues on Metoprolol 12.5 MG BID, Lisinopril 5 MG daily, Imdur 30 MG daily.  Atorvastatin 80 MG daily.  Was doing cardiac rehab after recent STEMI 07/28/20.  Was recently laid off at work due to health.  Aortic atherosclerosis noted on past imaging.  Needs [facility] drainage bag 600-D and saline syringes 10 cc at home.  Followed by Dr. Claudine Mouton at this time and has cholecystostomy tube in place.  Has concerns about this, no home care at this time in house.  He is unsure how to care for it.  They are deferring surgery until July due to recent cardiac health.  Needs more supplies for bag -- including prefilled saline syringes and bags + items to clean area. Hypertension status: stable  Satisfied with current treatment? yes Duration of hypertension: chronic BP monitoring frequency:  not checking BP range:  BP medication side effects:  no Medication compliance: good compliance Aspirin: yes Recurrent headaches: no Visual changes: no Palpitations: no Dyspnea: no Chest pain: no Lower extremity edema: no Dizzy/lightheaded: no  Relevant past medical, surgical, family and social history reviewed and updated as indicated. Interim medical history since our last visit reviewed. Allergies and medications reviewed and updated.  Review of Systems  Constitutional: Negative for activity change, diaphoresis, fatigue and fever.  Respiratory: Negative for cough, chest tightness, shortness of breath and wheezing.   Cardiovascular: Negative for  chest pain, palpitations and leg swelling.  Gastrointestinal: Negative.   Neurological: Negative.   Psychiatric/Behavioral: Negative.     Per HPI unless specifically indicated above     Objective:    BP 130/74   Pulse 60   Temp 98 F (36.7 C) (Oral)   Wt 157 lb 6.4 oz (71.4 kg)   SpO2 96%   BMI 24.65 kg/m   Wt Readings from Last 3 Encounters:  11/11/20 157 lb 6.4 oz (71.4 kg)  11/04/20 152 lb 1.9 oz (69 kg)  10/27/20 153 lb (69.4 kg)    Physical Exam Vitals and nursing note reviewed.  Constitutional:      General: He is awake. He is not in acute distress.    Appearance: He is well-developed and well-groomed. He is not ill-appearing.  HENT:     Head: Normocephalic and atraumatic.     Right Ear: Hearing normal. No drainage.     Left Ear: Hearing normal. No drainage.  Eyes:     General: Lids are normal.        Right eye: No discharge.        Left eye: No discharge.     Conjunctiva/sclera: Conjunctivae normal.     Pupils: Pupils are equal, round, and reactive to light.  Neck:     Thyroid: No thyromegaly.     Vascular: No carotid bruit.     Trachea: Trachea normal.  Cardiovascular:     Rate and Rhythm: Normal rate and regular  rhythm.     Heart sounds: Normal heart sounds, S1 normal and S2 normal. No murmur heard. No gallop.   Pulmonary:     Effort: Pulmonary effort is normal.     Breath sounds: Normal breath sounds.  Abdominal:     General: Bowel sounds are normal. There is no distension.     Palpations: Abdomen is soft. There is no hepatomegaly.     Tenderness: There is no abdominal tenderness.       Comments: Flushed drainage tube with 10 cc saline and showed patient how to perform.  Tube flushed easily and return present in tubing.  Musculoskeletal:        General: Normal range of motion.     Cervical back: Normal range of motion and neck supple.     Right lower leg: No edema.     Left lower leg: No edema.  Skin:    General: Skin is warm and dry.      Capillary Refill: Capillary refill takes less than 2 seconds.     Comments: Scattered pale purple bruises noted to bilateral upper extremities.  Neurological:     Mental Status: He is alert and oriented to person, place, and time.     Deep Tendon Reflexes: Reflexes are normal and symmetric.  Psychiatric:        Attention and Perception: Attention normal.        Mood and Affect: Mood normal.        Speech: Speech normal.        Behavior: Behavior normal. Behavior is cooperative.        Thought Content: Thought content normal.     Results for orders placed or performed in visit on 10/05/20  Potassium  Result Value Ref Range   Potassium 4.0 3.5 - 5.2 mmol/L      Assessment & Plan:   Problem List Items Addressed This Visit      Cardiovascular and Mediastinum   Essential hypertension    Stable with BP at goal today.  Recommend he monitor BP at home three mornings a week and document for provider, educated him on this.  Continue diet and exercise focus, DASH diet.  Labs next visit.  Continue current medication regimen and collaboration with cardiology.      STEMI involving right coronary artery (HCC)    Stable at this time, event took place 07/28/20.  Recommend he continue all current medications and collaboration with cardiology.  Continue cardiac rehab once able to return -- currently awaiting general surgery follow-up.      Unstable angina (HCC)    Ongoing with recent MI, at this time will continue collaboration with cardiology and medication regimen as prescribed by them.      Senile purpura (HCC)    As evidenced by bruising upper extremities with ASA use.  Recommend gentle skin care and monitor for wounds, if present immediately notify provider.      Aortic atherosclerosis (HCC)    Noted on past imaging.  Recommend he continue daily statin and ASA for prevention + complete cessation of smoking.        Nervous and Auditory   Nicotine dependence, cigarettes, w unsp  disorders    I have recommended complete cessation of tobacco use. I have discussed various options available for assistance with tobacco cessation including over the counter methods (Nicotine gum, patch and lozenges). We also discussed prescription options (Chantix, Nicotine Inhaler / Nasal Spray). The patient is not interested in pursuing any prescription  tobacco cessation options at this time.         Other   Cholecystostomy care [facility]) - Primary    Assessed tube today, intact and flushes well.  Continue collaboration with general surgery.  Will work on ordering supplies for him at home and home health order in to assist with care at this time.      Relevant Orders   Ambulatory referral to Home Health       Follow up plan: Return in about 4 weeks (around 12/09/2020) for Cholecystostomy tube check.

## 2020-11-14 ENCOUNTER — Ambulatory Visit: Payer: Medicare Other | Admitting: Family

## 2020-11-25 ENCOUNTER — Other Ambulatory Visit: Payer: Self-pay

## 2020-11-25 ENCOUNTER — Ambulatory Visit (INDEPENDENT_AMBULATORY_CARE_PROVIDER_SITE_OTHER): Payer: Medicare Other | Admitting: Nurse Practitioner

## 2020-11-25 ENCOUNTER — Encounter: Payer: Self-pay | Admitting: Nurse Practitioner

## 2020-11-25 ENCOUNTER — Ambulatory Visit: Payer: Medicare Other | Admitting: Nurse Practitioner

## 2020-11-25 DIAGNOSIS — K819 Cholecystitis, unspecified: Secondary | ICD-10-CM

## 2020-11-25 DIAGNOSIS — Z434 Encounter for attention to other artificial openings of digestive tract: Secondary | ICD-10-CM | POA: Diagnosis not present

## 2020-11-25 NOTE — Progress Notes (Signed)
BP 134/75 (BP Location: Right Arm, Cuff Size: Normal)   Pulse (!) 59   Temp 98.5 F (36.9 C) (Oral)   Wt 157 lb (71.2 kg)   SpO2 100%   BMI 24.59 kg/m    Subjective:    Patient ID: Andrew Potts, male    DOB: Apr 02, 1951, 70 y.o.   MRN: [id number]  HPI: Andrew Potts is a 70 y.o. male  Chief Complaint  Patient presents with  . Wound Check    Patient is here to have his drain/tube flushed and cleaned.    WOUND CHECK Followed by Dr. Claudine Mouton at this time and has cholecystostomy tube in place.  Has concerns about this, no home care at this time in house -- ordered on 11/11/20 but no home health agency accepting referral.  Showed him how to care for this last visit, but has not been consistently caring for it.  They are deferring surgery until July due to recent cardiac health.  Ordered more supplies for home care last visit.  Recurrent headaches: no Visual changes: no Palpitations: no Dyspnea: no Chest pain: no Lower extremity edema: no Dizzy/lightheaded: no  Relevant past medical, surgical, family and social history reviewed and updated as indicated. Interim medical history since our last visit reviewed. Allergies and medications reviewed and updated.  Review of Systems  Constitutional: Negative for activity change, diaphoresis, fatigue and fever.  Respiratory: Negative for cough, chest tightness, shortness of breath and wheezing.   Cardiovascular: Negative for chest pain, palpitations and leg swelling.  Gastrointestinal: Negative.   Neurological: Negative.   Psychiatric/Behavioral: Negative.     Per HPI unless specifically indicated above     Objective:    BP 134/75 (BP Location: Right Arm, Cuff Size: Normal)   Pulse (!) 59   Temp 98.5 F (36.9 C) (Oral)   Wt 157 lb (71.2 kg)   SpO2 100%   BMI 24.59 kg/m   Wt Readings from Last 3 Encounters:  11/25/20 157 lb (71.2 kg)  11/11/20 157 lb 6.4 oz (71.4 kg)  11/04/20 152 lb 1.9 oz (69 kg)    Physical  Exam Vitals and nursing note reviewed.  Constitutional:      General: He is awake. He is not in acute distress.    Appearance: He is well-developed and well-groomed. He is not ill-appearing.  HENT:     Head: Normocephalic and atraumatic.     Right Ear: Hearing normal. No drainage.     Left Ear: Hearing normal. No drainage.  Eyes:     General: Lids are normal.        Right eye: No discharge.        Left eye: No discharge.     Conjunctiva/sclera: Conjunctivae normal.     Pupils: Pupils are equal, round, and reactive to light.  Neck:     Thyroid: No thyromegaly.     Vascular: No carotid bruit.     Trachea: Trachea normal.  Cardiovascular:     Rate and Rhythm: Normal rate and regular rhythm.     Heart sounds: Normal heart sounds, S1 normal and S2 normal. No murmur heard. No gallop.   Pulmonary:     Effort: Pulmonary effort is normal.     Breath sounds: Normal breath sounds.  Abdominal:     General: Bowel sounds are normal. There is no distension.     Palpations: Abdomen is soft. There is no hepatomegaly.     Tenderness: There is no abdominal tenderness.  Comments: Flushed drainage tube with 10 cc saline and showed patient how to perform.  Tube flushed easily and return present in tubing.  Musculoskeletal:        General: Normal range of motion.     Cervical back: Normal range of motion and neck supple.     Right lower leg: No edema.     Left lower leg: No edema.  Skin:    General: Skin is warm and dry.     Capillary Refill: Capillary refill takes less than 2 seconds.     Comments: Scattered pale purple bruises noted to bilateral upper extremities.  Neurological:     Mental Status: He is alert and oriented to person, place, and time.     Deep Tendon Reflexes: Reflexes are normal and symmetric.  Psychiatric:        Attention and Perception: Attention normal.        Mood and Affect: Mood normal.        Speech: Speech normal.        Behavior: Behavior normal. Behavior is  cooperative.        Thought Content: Thought content normal.     Results for orders placed or performed in visit on 10/05/20  Potassium  Result Value Ref Range   Potassium 4.0 3.5 - 5.2 mmol/L      Assessment & Plan:   Problem List Items Addressed This Visit      Digestive   Cholecystitis    Continue collaboration with general surgery.        Other   Cholecystostomy care [facility])    Assessed tube today, intact and flushes well.  Continue collaboration with general surgery.  Ordered supplies for him last visit -- home health was ordered but no one is taking referral at his time.          Follow up plan: Return for as scheduled.

## 2020-11-25 NOTE — Patient Instructions (Signed)
Wound Care, Adult Taking care of your wound properly can help to prevent pain, infection, and scarring. It can also help your wound heal more quickly. Follow instructions from your health care provider about how to care for your wound. Supplies needed:  Soap and water.  Wound cleanser.  Gauze.  If needed, a clean bandage (dressing) or other type of wound dressing material to cover or place in the wound. Follow your health care provider's instructions about what dressing supplies to use.  Cream or ointment to apply to the wound, if told by your health care provider. How to care for your wound Cleaning the wound Ask your health care provider how to clean the wound. This may include:  Using mild soap and water or a wound cleanser.  Using a clean gauze to pat the wound dry after cleaning it. Do not rub or scrub the wound. Dressing care  Wash your hands with soap and water for at least 20 seconds before and after you change the dressing. If soap and water are not available, use hand sanitizer.  Change your dressing as told by your health care provider. This may include: ? Cleaning or rinsing out (irrigating) the wound. ? Placing a dressing over the wound or in the wound (packing). ? Covering the wound with an outer dressing.  Leave any stitches (sutures), skin glue, or adhesive strips in place. These skin closures may need to stay in place for 2 weeks or longer. If adhesive strip edges start to loosen and curl up, you may trim the loose edges. Do not remove adhesive strips completely unless your health care provider tells you to do that.  Ask your health care provider when you can leave the wound uncovered. Checking for infection Check your wound area every day for signs of infection. Check for:  More redness, swelling, or pain.  Fluid or blood.  Warmth.  Pus or a bad smell.   Follow these instructions at home Medicines  If you were prescribed an antibiotic medicine, cream, or  ointment, take or apply it as told by your health care provider. Do not stop using the antibiotic even if your condition improves.  If you were prescribed pain medicine, take it 30 minutes before you do any wound care or as told by your health care provider.  Take over-the-counter and prescription medicines only as told by your health care provider. Eating and drinking  Eat a diet that includes protein, vitamin A, vitamin C, and other nutrient-rich foods to help the wound heal. ? Foods rich in protein include meat, fish, eggs, dairy, beans, and nuts. ? Foods rich in vitamin A include carrots and dark green, leafy vegetables. ? Foods rich in vitamin C include citrus fruits, tomatoes, broccoli, and peppers.  Drink enough fluid to keep your urine pale yellow. General instructions  Do not take baths, swim, use a hot tub, or do anything that would put the wound underwater until your health care provider approves. Ask your health care provider if you may take showers. You may only be allowed to take sponge baths.  Do not scratch or pick at the wound. Keep it covered as told by your health care provider.  Return to your normal activities as told by your health care provider. Ask your health care provider what activities are safe for you.  Protect your wound from the sun when you are outside for the first 6 months, or for as long as told by your health care provider. Cover   up the scar area or apply sunscreen that has an SPF of at least 30.  Do not use any products that contain nicotine or tobacco, such as cigarettes, e-cigarettes, and chewing tobacco. These may delay wound healing. If you need help quitting, ask your health care provider.  Keep all follow-up visits as told by your health care provider. This is important. Contact a health care provider if:  You received a tetanus shot and you have swelling, severe pain, redness, or bleeding at the injection site.  Your pain is not controlled  with medicine.  You have any of these signs of infection: ? More redness, swelling, or pain around the wound. ? Fluid or blood coming from the wound. ? Warmth coming from the wound. ? Pus or a bad smell coming from the wound. ? A fever or chills.  You are nauseous or you vomit.  You are dizzy. Get help right away if:  You have a red streak of skin near the area around your wound.  Your wound has been closed with staples, sutures, skin glue, or adhesive strips and it begins to open up and separate.  Your wound is bleeding, and the bleeding does not stop with gentle pressure.  You have a rash.  You faint.  You have trouble breathing. These symptoms may represent a serious problem that is an emergency. Do not wait to see if the symptoms will go away. Get medical help right away. Call your local emergency services (911 in the [place] Do not drive yourself to the hospital. Summary  Always wash your hands with soap and water for at least 20 seconds before and after changing your dressing.  Change your dressing as told by your health care provider.  To help with healing, eat foods that are rich in protein, vitamin A, vitamin C, and other nutrients.  Check your wound every day for signs of infection. Contact your health care provider if you suspect that your wound is infected. This information is not intended to replace advice given to you by your health care provider. Make sure you discuss any questions you have with your health care provider. Document Revised: 04/17/2019 Document Reviewed: 04/17/2019 Elsevier Patient Education  2021 Elsevier Inc.  

## 2020-11-25 NOTE — Assessment & Plan Note (Signed)
Assessed tube today, intact and flushes well.  Continue collaboration with general surgery.  Ordered supplies for him last visit -- home health was ordered but no one is taking referral at his time.

## 2020-11-25 NOTE — Assessment & Plan Note (Signed)
Continue collaboration with general surgery. °

## 2020-11-29 ENCOUNTER — Ambulatory Visit: Payer: Self-pay | Admitting: Surgery

## 2020-11-29 ENCOUNTER — Encounter: Payer: Self-pay | Admitting: Surgery

## 2020-11-29 ENCOUNTER — Telehealth: Payer: Self-pay | Admitting: Surgery

## 2020-11-29 ENCOUNTER — Ambulatory Visit: Payer: Medicare Other | Admitting: Surgery

## 2020-11-29 ENCOUNTER — Other Ambulatory Visit: Payer: Self-pay

## 2020-11-29 VITALS — BP 132/68 | HR 80 | Temp 98.0°F | Ht 67.0 in | Wt 155.8 lb

## 2020-11-29 DIAGNOSIS — K810 Acute cholecystitis: Secondary | ICD-10-CM | POA: Diagnosis not present

## 2020-11-29 DIAGNOSIS — I200 Unstable angina: Secondary | ICD-10-CM | POA: Diagnosis not present

## 2020-11-29 DIAGNOSIS — J449 Chronic obstructive pulmonary disease, unspecified: Secondary | ICD-10-CM | POA: Diagnosis not present

## 2020-11-29 DIAGNOSIS — D692 Other nonthrombocytopenic purpura: Secondary | ICD-10-CM | POA: Diagnosis not present

## 2020-11-29 DIAGNOSIS — I2510 Atherosclerotic heart disease of native coronary artery without angina pectoris: Secondary | ICD-10-CM | POA: Diagnosis not present

## 2020-11-29 DIAGNOSIS — I10 Essential (primary) hypertension: Secondary | ICD-10-CM | POA: Diagnosis not present

## 2020-11-29 DIAGNOSIS — R030 Elevated blood-pressure reading, without diagnosis of hypertension: Secondary | ICD-10-CM | POA: Diagnosis not present

## 2020-11-29 DIAGNOSIS — E782 Mixed hyperlipidemia: Secondary | ICD-10-CM | POA: Diagnosis not present

## 2020-11-29 NOTE — Progress Notes (Signed)
Patient ID: Andrew Potts, male   DOB: 1951-04-30, 70 y.o.   MRN: [id number]  Chief Complaint:  Cholecystostomy   History of Present Illness Andrew Potts is a 70 y.o. male with a multimonth course of maintaining a percutaneous cholecystostomy.  Treated for acute cholecystitis shortly after his coronary interventions with a drain and antibiotics.  He has recovered well and has now been released for a brief discontinuance of effient in order to proceed with definitive cholecystostomy.   Past Medical History Past Medical History:  Diagnosis Date  . Arthritis   . CHF (congestive heart failure) (HCC)   . COPD (chronic obstructive pulmonary disease) (HCC)   . Coronary artery disease    STEMI  . Hepatitis    Hep C  . Myocardial infarction [facility])    07/2020      Past Surgical History:  Procedure Laterality Date  . ABDOMINAL EXPLORATION SURGERY    . CORONARY/GRAFT ACUTE MI REVASCULARIZATION N/A 07/28/2020   Procedure: CORONARY/GRAFT ACUTE MI REVASCULARIZATION;  Surgeon: Alwyn Pea, MD;  Location: [facility] INVASIVE CV LAB;  Service: Cardiovascular;  Laterality: N/A;  . LEFT HEART CATH AND CORONARY ANGIOGRAPHY N/A 07/28/2020   Procedure: LEFT HEART CATH AND CORONARY ANGIOGRAPHY;  Surgeon: Alwyn Pea, MD;  Location: [facility] INVASIVE CV LAB;  Service: Cardiovascular;  Laterality: N/A;  . NASAL SINUS SURGERY      Allergies  Allergen Reactions  . Levaquin [Levofloxacin] Swelling  . Codeine     Abdominal pain, nose itching  . Codone [Hydrocodone] Other (See Comments)    Abdominal pain, felt loopy    Current Outpatient Medications  Medication Sig Dispense Refill  . aspirin EC 81 MG EC tablet Take 1 tablet (81 mg total) by mouth daily. Swallow whole. 30 tablet 11  . atorvastatin (LIPITOR) 80 MG tablet Take 1 tablet (80 mg total) by mouth daily. 90 tablet 1  . Ensure (ENSURE) Take 237 mLs by mouth daily.    . isosorbide mononitrate (IMDUR) 30 MG 24 hr tablet Take 1 tablet (30 mg  total) by mouth daily. 30 tablet 0  . lisinopril (ZESTRIL) 5 MG tablet Take 1 tablet (5 mg total) by mouth daily. 90 tablet 4  . metoprolol tartrate (LOPRESSOR) 25 MG tablet Take 0.5 tablets (12.5 mg total) by mouth 2 (two) times daily. 60 tablet 1  . mometasone-formoterol (DULERA) 200-5 MCG/ACT AERO Inhale 2 puffs into the lungs in the morning and at bedtime.    . nitroGLYCERIN (NITROSTAT) 0.4 MG SL tablet Place 1 tablet (0.4 mg total) under the tongue every 5 (five) minutes as needed for chest pain. 30 tablet 12  . pantoprazole (PROTONIX) 40 MG tablet Take 1 tablet (40 mg total) by mouth daily. 90 tablet 0  . prasugrel (EFFIENT) 10 MG TABS tablet Take 10 mg by mouth daily.    Marland Kitchen PROAIR HFA 108 (90 Base) MCG/ACT inhaler Inhale 2 puffs into the lungs every 4 (four) hours as needed. (Patient taking differently: Inhale 2 puffs into the lungs every 4 (four) hours as needed for shortness of breath or wheezing.) 18 g 4  . sildenafil (REVATIO) 20 MG tablet Take 20 mg by mouth daily as needed (erectile dysfunction).    . SYMBICORT 160-4.5 MCG/ACT inhaler Inhale 2 puffs into the lungs 2 (two) times daily. 10.2 g 5   No current facility-administered medications for this visit.    Family History Family History  Problem Relation Age of Onset  . Alcohol abuse Father   .  Alcohol abuse Brother   . Aneurysm Brother       Social History Social History   Tobacco Use  . Smoking status: Former Smoker    Packs/day: 0.50    Years: 47.00    Pack years: 23.50    Types: Cigarettes    Quit date: 07/28/2020    Years since quitting: 0.3  . Smokeless tobacco: Never Used  . Tobacco comment: 08/24/20 given quit smoking paperwork and encouraged continued cessation  Vaping Use  . Vaping Use: Never used  Substance Use Topics  . Alcohol use: Not Currently  . Drug use: Never        Review of Systems  Constitutional: Negative.   HENT: Negative.   Eyes: Negative.   Respiratory: Negative.   Cardiovascular:  Negative.   Gastrointestinal: Negative.   Genitourinary: Negative.   Skin: Negative.   Neurological: Negative.   Psychiatric/Behavioral: Negative.       Physical Exam Blood pressure 132/68, pulse 80, temperature 98 F (36.7 C), height 5\' 7"  (1.702 m), weight 155 lb 12.8 oz (70.7 kg), SpO2 96 %. Last Weight  Most recent update: 11/29/2020  1:37 PM   Weight  70.7 kg (155 lb 12.8 oz)            CONSTITUTIONAL: Well developed, and nourished, appropriately responsive and aware without distress.   EYES: Sclera non-icteric.   EARS, NOSE, MOUTH AND THROAT: Mask worn.  Hearing is intact to voice.  NECK: Trachea is midline, and there is no jugular venous distension.  LYMPH NODES:  Lymph nodes in the neck are not enlarged. RESPIRATORY:  Lungs are clear, and breath sounds are equal bilaterally. Normal respiratory effort without pathologic use of accessory muscles. CARDIOVASCULAR: Heart is regular in rate and rhythm. GI: The abdomen is soft, nontender, and nondistended. RUQ drain with clear bile within bag and tubing.  There were no palpable masses. I did not appreciate hepatosplenomegaly. There were normal bowel sounds. MUSCULOSKELETAL:  Symmetrical muscle tone appreciated in all four extremities.    SKIN: Skin turgor is normal. No pathologic skin lesions appreciated.  NEUROLOGIC:  Motor and sensation appear grossly normal.  Cranial nerves are grossly without defect. PSYCH:  Alert and oriented to person, place and time. Affect is appropriate for situation.  Data Reviewed I have personally reviewed what is currently available of the patient's imaging, recent labs and medical records.   Labs:  CBC Latest Ref Rng & Units 09/28/2020 09/22/2020 09/21/2020  WBC 3.4 - 10.8 x10E3/uL 9.8 9.6 10.9(H)  Hemoglobin 13.0 - 17.7 g/dL 11/21/2020 50.0 93.8  Hematocrit 37.5 - 51.0 % 41.3 44.0 43.8  Platelets 150 - 450 x10E3/uL 333 191 219   CMP Latest Ref Rng & Units 10/05/2020 09/28/2020 09/21/2020  Glucose 65 - 99  mg/dL - 87 11/21/2020)  BUN 8 - 27 mg/dL - 11 8  Creatinine 993(Z - 1.27 mg/dL - 1.69 6.78  Sodium 9.38 - 144 mmol/L - 137 136  Potassium 3.5 - 5.2 mmol/L 4.0 5.9(H) 4.0  Chloride 96 - 106 mmol/L - 101 101  CO2 20 - 29 mmol/L - 20 26  Calcium 8.6 - 10.2 mg/dL - 9.6 9.0  Total Protein 6.0 - 8.5 g/dL - 7.0 -  Total Bilirubin 0.0 - 1.2 mg/dL - 0.8 -  Alkaline Phos 44 - 121 IU/L - 81 -  AST 0 - 40 IU/L - 48(H) -  ALT 0 - 44 IU/L - 53(H) -      Imaging:  EXAM: ULTRASOUND ABDOMEN  LIMITED RIGHT UPPER QUADRANT  COMPARISON:  CT same day  FINDINGS: Gallbladder:  Gallbladder wall thickening with some surrounding fluid. No Murphy sign. No visible gallstone. No sludge. Acalculous cholecystitis not excluded. Consider nuclear medicine scan.  Common bile duct:  Diameter: 5 mm.  Normal.  Liver:  No focal lesion identified. Within normal limits in parenchymal echogenicity. Portal vein is patent on color Doppler imaging with normal direction of blood flow towards the liver. Insignificant 1.5 cm cyst in the right lobe.  Other: None.  IMPRESSION: Thickened gallbladder wall with some surrounding fluid. No visible stone or sludge. No Murphy sign. If clinical concern persists, consider nuclear medicine hepatobiliary scan. At this point, acalculous cholecystitis is not excluded based on the imaging.   Electronically Signed   By: Paulina Fusi M.D.   On: 09/20/2020 12:13  Within last 24 hrs: No results found.  Assessment    Prior acute cholecystitis.  Patient Active Problem List   Diagnosis Date Noted  . Aortic atherosclerosis (HCC) 10/11/2020  . Cholecystostomy care (HCC) 09/29/2020  . Senile purpura (HCC) 09/28/2020  . Cholecystitis 09/21/2020  . Unstable angina (HCC) 09/20/2020  . CAD, multiple vessel 09/20/2020  . STEMI involving right coronary artery (HCC) 07/28/2020  . HLD (hyperlipidemia) 07/28/2020  . Chronic active hepatitis (HCC) 03/11/2019  . Erectile  dysfunction 02/04/2019  . COPD (chronic obstructive pulmonary disease) (HCC) 02/04/2019  . Nicotine dependence, cigarettes, w unsp disorders 02/04/2019  . Essential hypertension 02/04/2019    Plan    Robotic cholecystectomy.  This patient was seen and examined, and I concur with the H&P associated with this note.  This was discussed thoroughly.  Optimal plan is for robotic cholecystectomy.  Risks and benefits have been discussed with the patient which include but are not limited to anesthesia, bleeding, infection, biliary ductal injury or stenosis, other associated unanticipated injuries affiliated with laparoscopic surgery.  I believe there is the desire to proceed, interpreter utilized as needed.  Questions elicited and answered to satisfaction.  No guarantees ever expressed or implied.     These notes generated with voice recognition software. I apologize for typographical errors.  [name] M.D., FACS 11/29/2020, 2:29 PM

## 2020-11-29 NOTE — Progress Notes (Signed)
Received Cardiac Clearance from Dr America Brown office. He may stop his Aspirin and Effient 5 days prior to surgery and resume ASAP post op. The patient is optimized for surgery.

## 2020-11-29 NOTE — Telephone Encounter (Signed)
Patient has been advised of Pre-Admission date/time, COVID Testing date and Surgery date.  Surgery Date: 12/07/20 Preadmission Testing Date: 12/02/20 (phone 1p-5p) Covid Testing Date: Not needed per [facility].     Patient has been made aware to call [phone], between 1-3:00pm the day before surgery, to find out what time to arrive for surgery.

## 2020-11-29 NOTE — Patient Instructions (Addendum)
You have requested to have your gallbladder removed. This will be done at [facility] with Dr. Claudine Mouton.  You will most likely be out of work 1-2 weeks for this surgery. You will return after your post-op appointment with a lifting restriction for approximately 4 more weeks.  Your last dose of Effient(Prasugrel) and Aspirin will be on 12/01/20.   You will be able to eat anything you would like to following surgery. But, start by eating a bland diet and advance this as tolerated. The Gallbladder diet is below, please go as closely by this diet as possible prior to surgery to avoid any further attacks.  Please see the (blue)pre-care form that you have been given today. If you have any questions, please call our office.  Laparoscopic Cholecystectomy Laparoscopic cholecystectomy is surgery to remove the gallbladder. The gallbladder is located in the upper right part of the abdomen, behind the liver. It is a storage sac for bile, which is produced in the liver. Bile aids in the digestion and absorption of fats. Cholecystectomy is often done for inflammation of the gallbladder (cholecystitis). This condition is usually caused by a buildup of gallstones (cholelithiasis) in the gallbladder. Gallstones can block the flow of bile, and that can result in inflammation and pain. In severe cases, emergency surgery may be required. If emergency surgery is not required, you will have time to prepare for the procedure. Laparoscopic surgery is an alternative to open surgery. Laparoscopic surgery has a shorter recovery time. Your common bile duct may also need to be examined during the procedure. If stones are found in the common bile duct, they may be removed. LET [facility] CARE PROVIDER KNOW ABOUT:  Any allergies you have.  All medicines you are taking, including vitamins, herbs, eye drops, creams, and over-the-counter medicines.  Previous problems you or members of your family have had with the use of  anesthetics.  Any blood disorders you have.  Previous surgeries you have had.    Any medical conditions you have. RISKS AND COMPLICATIONS Generally, this is a safe procedure. However, problems may occur, including:  Infection.  Bleeding.  Allergic reactions to medicines.  Damage to other structures or organs.  A stone remaining in the common bile duct.  A bile leak from the cyst duct that is clipped when your gallbladder is removed.  The need to convert to open surgery, which requires a larger incision in the abdomen. This may be necessary if your surgeon thinks that it is not safe to continue with a laparoscopic procedure. BEFORE THE PROCEDURE  Ask your health care provider about:  Changing or stopping your regular medicines. This is especially important if you are taking diabetes medicines or blood thinners.  Taking medicines such as aspirin and ibuprofen. These medicines can thin your blood. Do not take these medicines before your procedure if your health care provider instructs you not to.  Follow instructions from your health care provider about eating or drinking restrictions.  Let your health care provider know if you develop a cold or an infection before surgery.  Plan to have someone take you home after the procedure.  Ask your health care provider how your surgical site will be marked or identified.  You may be given antibiotic medicine to help prevent infection. PROCEDURE  To reduce your risk of infection:  Your health care team will wash or sanitize their hands.  Your skin will be washed with soap.  An IV tube may be inserted into one of  your veins.  You will be given a medicine to make you fall asleep (general anesthetic).  A breathing tube will be placed in your mouth.  The surgeon will make several small cuts (incisions) in your abdomen.  A thin, lighted tube (laparoscope) that has a tiny camera on the end will be inserted through one of the  small incisions. The camera on the laparoscope will send a picture to a TV screen (monitor) in the operating room. This will give the surgeon a good view inside your abdomen.  A gas will be pumped into your abdomen. This will expand your abdomen to give the surgeon more room to perform the surgery.  Other tools that are needed for the procedure will be inserted through the other incisions. The gallbladder will be removed through one of the incisions.  After your gallbladder has been removed, the incisions will be closed with stitches (sutures), staples, or skin glue.  Your incisions may be covered with a bandage (dressing). The procedure may vary among health care providers and hospitals. AFTER THE PROCEDURE  Your blood pressure, heart rate, breathing rate, and blood oxygen level will be monitored often until the medicines you were given have worn off.  You will be given medicines as needed to control your pain.   This information is not intended to replace advice given to you by your health care provider. Make sure you discuss any questions you have with your health care provider.   Document Released: 07/02/2005 Document Revised: 03/23/2015 Document Reviewed: 02/11/2013 Elsevier Interactive Patient Education 2016 [place] Diet for Gallbladder Conditions A low-fat diet can be helpful if you have pancreatitis or a gallbladder condition. With these conditions, your pancreas and gallbladder have trouble digesting fats. A healthy eating plan with less fat will help rest your pancreas and gallbladder and reduce your symptoms. WHAT DO I NEED TO KNOW ABOUT THIS DIET?  Eat a low-fat diet.  Reduce your fat intake to less than 20-30% of your total daily calories. This is less than 50-60 g of fat per day.  Remember that you need some fat in your diet. Ask your dietician what your daily goal should be.  Choose nonfat and low-fat healthy foods. Look for the words "nonfat," "low  fat," or "fat free."  As a guide, look on the label and choose foods with less than 3 g of fat per serving. Eat only one serving.  Avoid alcohol.  Do not smoke. If you need help quitting, talk with your health care provider.  Eat small frequent meals instead of three large heavy meals. WHAT FOODS CAN I EAT? Grains Include healthy grains and starches such as potatoes, wheat bread, fiber-rich cereal, and brown rice. Choose whole grain options whenever possible. In adults, whole grains should account for 45-65% of your daily calories.  Fruits and Vegetables Eat plenty of fruits and vegetables. Fresh fruits and vegetables add fiber to your diet. Meats and Other Protein Sources Eat lean meat such as chicken and pork. Trim any fat off of meat before cooking it. Eggs, fish, and beans are other sources of protein. In adults, these foods should account for 10-35% of your daily calories. Dairy Choose low-fat milk and dairy options. Dairy includes fat and protein, as well as calcium.  Fats and Oils Limit high-fat foods such as fried foods, sweets, baked goods, sugary drinks.  Other Creamy sauces and condiments, such as mayonnaise, can add extra fat. Think about whether or not you need  to use them, or use smaller amounts or low fat options. WHAT FOODS ARE NOT RECOMMENDED?  High fat foods, such as:  [employer].  Ice cream.  [place] toast.  Sweet rolls.  Pizza.  Cheese bread.  Foods covered with batter, butter, creamy sauces, or cheese.  Fried foods.  Sugary drinks and desserts.  Foods that cause gas or bloating   This information is not intended to replace advice given to you by your health care provider. Make sure you discuss any questions you have with your health care provider.   Document Released: 07/07/2013 Document Reviewed: 07/07/2013 Elsevier Interactive Patient Education [employer].

## 2020-11-29 NOTE — H&P (View-Only) (Signed)
Patient ID: Andrew Potts, male   DOB: 1951-04-30, 70 y.o.   MRN: [id number]  Chief Complaint:  Cholecystostomy   History of Present Illness Andrew Potts is a 70 y.o. male with a multimonth course of maintaining a percutaneous cholecystostomy.  Treated for acute cholecystitis shortly after his coronary interventions with a drain and antibiotics.  He has recovered well and has now been released for a brief discontinuance of effient in order to proceed with definitive cholecystostomy.   Past Medical History Past Medical History:  Diagnosis Date  . Arthritis   . CHF (congestive heart failure) (HCC)   . COPD (chronic obstructive pulmonary disease) (HCC)   . Coronary artery disease    STEMI  . Hepatitis    Hep C  . Myocardial infarction [facility])    07/2020      Past Surgical History:  Procedure Laterality Date  . ABDOMINAL EXPLORATION SURGERY    . CORONARY/GRAFT ACUTE MI REVASCULARIZATION N/A 07/28/2020   Procedure: CORONARY/GRAFT ACUTE MI REVASCULARIZATION;  Surgeon: Alwyn Pea, MD;  Location: [facility] INVASIVE CV LAB;  Service: Cardiovascular;  Laterality: N/A;  . LEFT HEART CATH AND CORONARY ANGIOGRAPHY N/A 07/28/2020   Procedure: LEFT HEART CATH AND CORONARY ANGIOGRAPHY;  Surgeon: Alwyn Pea, MD;  Location: [facility] INVASIVE CV LAB;  Service: Cardiovascular;  Laterality: N/A;  . NASAL SINUS SURGERY      Allergies  Allergen Reactions  . Levaquin [Levofloxacin] Swelling  . Codeine     Abdominal pain, nose itching  . Codone [Hydrocodone] Other (See Comments)    Abdominal pain, felt loopy    Current Outpatient Medications  Medication Sig Dispense Refill  . aspirin EC 81 MG EC tablet Take 1 tablet (81 mg total) by mouth daily. Swallow whole. 30 tablet 11  . atorvastatin (LIPITOR) 80 MG tablet Take 1 tablet (80 mg total) by mouth daily. 90 tablet 1  . Ensure (ENSURE) Take 237 mLs by mouth daily.    . isosorbide mononitrate (IMDUR) 30 MG 24 hr tablet Take 1 tablet (30 mg  total) by mouth daily. 30 tablet 0  . lisinopril (ZESTRIL) 5 MG tablet Take 1 tablet (5 mg total) by mouth daily. 90 tablet 4  . metoprolol tartrate (LOPRESSOR) 25 MG tablet Take 0.5 tablets (12.5 mg total) by mouth 2 (two) times daily. 60 tablet 1  . mometasone-formoterol (DULERA) 200-5 MCG/ACT AERO Inhale 2 puffs into the lungs in the morning and at bedtime.    . nitroGLYCERIN (NITROSTAT) 0.4 MG SL tablet Place 1 tablet (0.4 mg total) under the tongue every 5 (five) minutes as needed for chest pain. 30 tablet 12  . pantoprazole (PROTONIX) 40 MG tablet Take 1 tablet (40 mg total) by mouth daily. 90 tablet 0  . prasugrel (EFFIENT) 10 MG TABS tablet Take 10 mg by mouth daily.    Marland Kitchen PROAIR HFA 108 (90 Base) MCG/ACT inhaler Inhale 2 puffs into the lungs every 4 (four) hours as needed. (Patient taking differently: Inhale 2 puffs into the lungs every 4 (four) hours as needed for shortness of breath or wheezing.) 18 g 4  . sildenafil (REVATIO) 20 MG tablet Take 20 mg by mouth daily as needed (erectile dysfunction).    . SYMBICORT 160-4.5 MCG/ACT inhaler Inhale 2 puffs into the lungs 2 (two) times daily. 10.2 g 5   No current facility-administered medications for this visit.    Family History Family History  Problem Relation Age of Onset  . Alcohol abuse Father   .  Alcohol abuse Brother   . Aneurysm Brother       Social History Social History   Tobacco Use  . Smoking status: Former Smoker    Packs/day: 0.50    Years: 47.00    Pack years: 23.50    Types: Cigarettes    Quit date: 07/28/2020    Years since quitting: 0.3  . Smokeless tobacco: Never Used  . Tobacco comment: 08/24/20 given quit smoking paperwork and encouraged continued cessation  Vaping Use  . Vaping Use: Never used  Substance Use Topics  . Alcohol use: Not Currently  . Drug use: Never        Review of Systems  Constitutional: Negative.   HENT: Negative.   Eyes: Negative.   Respiratory: Negative.   Cardiovascular:  Negative.   Gastrointestinal: Negative.   Genitourinary: Negative.   Skin: Negative.   Neurological: Negative.   Psychiatric/Behavioral: Negative.       Physical Exam Blood pressure 132/68, pulse 80, temperature 98 F (36.7 C), height 5\' 7"  (1.702 m), weight 155 lb 12.8 oz (70.7 kg), SpO2 96 %. Last Weight  Most recent update: 11/29/2020  1:37 PM   Weight  70.7 kg (155 lb 12.8 oz)            CONSTITUTIONAL: Well developed, and nourished, appropriately responsive and aware without distress.   EYES: Sclera non-icteric.   EARS, NOSE, MOUTH AND THROAT: Mask worn.  Hearing is intact to voice.  NECK: Trachea is midline, and there is no jugular venous distension.  LYMPH NODES:  Lymph nodes in the neck are not enlarged. RESPIRATORY:  Lungs are clear, and breath sounds are equal bilaterally. Normal respiratory effort without pathologic use of accessory muscles. CARDIOVASCULAR: Heart is regular in rate and rhythm. GI: The abdomen is soft, nontender, and nondistended. RUQ drain with clear bile within bag and tubing.  There were no palpable masses. I did not appreciate hepatosplenomegaly. There were normal bowel sounds. MUSCULOSKELETAL:  Symmetrical muscle tone appreciated in all four extremities.    SKIN: Skin turgor is normal. No pathologic skin lesions appreciated.  NEUROLOGIC:  Motor and sensation appear grossly normal.  Cranial nerves are grossly without defect. PSYCH:  Alert and oriented to person, place and time. Affect is appropriate for situation.  Data Reviewed I have personally reviewed what is currently available of the patient's imaging, recent labs and medical records.   Labs:  CBC Latest Ref Rng & Units 09/28/2020 09/22/2020 09/21/2020  WBC 3.4 - 10.8 x10E3/uL 9.8 9.6 10.9(H)  Hemoglobin 13.0 - 17.7 g/dL 11/21/2020 50.0 93.8  Hematocrit 37.5 - 51.0 % 41.3 44.0 43.8  Platelets 150 - 450 x10E3/uL 333 191 219   CMP Latest Ref Rng & Units 10/05/2020 09/28/2020 09/21/2020  Glucose 65 - 99  mg/dL - 87 11/21/2020)  BUN 8 - 27 mg/dL - 11 8  Creatinine 993(Z - 1.27 mg/dL - 1.69 6.78  Sodium 9.38 - 144 mmol/L - 137 136  Potassium 3.5 - 5.2 mmol/L 4.0 5.9(H) 4.0  Chloride 96 - 106 mmol/L - 101 101  CO2 20 - 29 mmol/L - 20 26  Calcium 8.6 - 10.2 mg/dL - 9.6 9.0  Total Protein 6.0 - 8.5 g/dL - 7.0 -  Total Bilirubin 0.0 - 1.2 mg/dL - 0.8 -  Alkaline Phos 44 - 121 IU/L - 81 -  AST 0 - 40 IU/L - 48(H) -  ALT 0 - 44 IU/L - 53(H) -      Imaging:  EXAM: ULTRASOUND ABDOMEN  LIMITED RIGHT UPPER QUADRANT  COMPARISON:  CT same day  FINDINGS: Gallbladder:  Gallbladder wall thickening with some surrounding fluid. No Murphy sign. No visible gallstone. No sludge. Acalculous cholecystitis not excluded. Consider nuclear medicine scan.  Common bile duct:  Diameter: 5 mm.  Normal.  Liver:  No focal lesion identified. Within normal limits in parenchymal echogenicity. Portal vein is patent on color Doppler imaging with normal direction of blood flow towards the liver. Insignificant 1.5 cm cyst in the right lobe.  Other: None.  IMPRESSION: Thickened gallbladder wall with some surrounding fluid. No visible stone or sludge. No Murphy sign. If clinical concern persists, consider nuclear medicine hepatobiliary scan. At this point, acalculous cholecystitis is not excluded based on the imaging.   Electronically Signed   By: Paulina Fusi M.D.   On: 09/20/2020 12:13  Within last 24 hrs: No results found.  Assessment    Prior acute cholecystitis.  Patient Active Problem List   Diagnosis Date Noted  . Aortic atherosclerosis (HCC) 10/11/2020  . Cholecystostomy care (HCC) 09/29/2020  . Senile purpura (HCC) 09/28/2020  . Cholecystitis 09/21/2020  . Unstable angina (HCC) 09/20/2020  . CAD, multiple vessel 09/20/2020  . STEMI involving right coronary artery (HCC) 07/28/2020  . HLD (hyperlipidemia) 07/28/2020  . Chronic active hepatitis (HCC) 03/11/2019  . Erectile  dysfunction 02/04/2019  . COPD (chronic obstructive pulmonary disease) (HCC) 02/04/2019  . Nicotine dependence, cigarettes, w unsp disorders 02/04/2019  . Essential hypertension 02/04/2019    Plan    Robotic cholecystectomy.  This patient was seen and examined, and I concur with the H&P associated with this note.  This was discussed thoroughly.  Optimal plan is for robotic cholecystectomy.  Risks and benefits have been discussed with the patient which include but are not limited to anesthesia, bleeding, infection, biliary ductal injury or stenosis, other associated unanticipated injuries affiliated with laparoscopic surgery.  I believe there is the desire to proceed, interpreter utilized as needed.  Questions elicited and answered to satisfaction.  No guarantees ever expressed or implied.     These notes generated with voice recognition software. I apologize for typographical errors.  [name] M.D., FACS 11/29/2020, 2:29 PM

## 2020-12-02 ENCOUNTER — Other Ambulatory Visit
Admission: RE | Admit: 2020-12-02 | Discharge: 2020-12-02 | Disposition: A | Discharge status: Home / Self Care | Payer: Medicare Other | Source: Ambulatory Visit | Attending: Surgery | Admitting: Surgery

## 2020-12-02 HISTORY — DX: Pneumonia, unspecified organism: J18.9

## 2020-12-02 HISTORY — DX: Gastro-esophageal reflux disease without esophagitis: K21.9

## 2020-12-02 HISTORY — DX: Essential (primary) hypertension: I10

## 2020-12-02 HISTORY — DX: Angina pectoris, unspecified: I20.9

## 2020-12-02 NOTE — Patient Instructions (Addendum)
Your procedure is scheduled on: 12/07/20 - Wednesday Report to the Registration Desk on the 1st floor of the [facility]. To find out your arrival time, please call [phone] between 1PM - 3PM on: 12/06/20 - Tuesday  REMEMBER: Instructions that are not followed completely may result in serious medical risk, up to and including death; or upon the discretion of your surgeon and anesthesiologist your surgery may need to be rescheduled.  Do not eat food after midnight the night before surgery.  No gum chewing, lozengers or hard candies.  You may however, drink CLEAR liquids up to 2 hours before you are scheduled to arrive for your surgery. Do not drink anything within 2 hours of your scheduled arrival time.  Clear liquids include: - water  - apple juice without pulp - gatorade (not RED, PURPLE, OR BLUE) - black coffee or tea (Do NOT add milk or creamers to the coffee or tea) Do NOT drink anything that is not on this list.  TAKE THESE MEDICATIONS THE MORNING OF SURGERY WITH A SIP OF WATER:  - isosorbide mononitrate (IMDUR) 30 MG 24 hr tablet - metoprolol tartrate (LOPRESSOR) 25 MG tablet - pantoprazole (PROTONIX) 40 MG tablet, (take one the night before and one on the morning of surgery - helps to prevent nausea after surgery.) - SYMBICORT 160-4.5 MCG/ACT inhaler  Use inhaler PROAIR HFA 108 (90 Base) MCG/ACT inhaler on the day of surgery and bring to the hospital.  Follow recommendations from Cardiologist, Pulmonologist or PCP regarding stopping Aspirin, Coumadin, Plavix, Effient,  Eliquis, Pradaxa, or Pletal. Stop Aspirin and EFFient beginning 12/02/20.  One week prior to surgery: Stop Anti-inflammatories (NSAIDS) such as Advil, Aleve, Ibuprofen, Motrin, Naproxen, Naprosyn and Aspirin based products such as Excedrin, Goodys Powder, BC Powder.  Stop ANY OVER THE COUNTER supplements until after surgery.  No Alcohol for 24 hours before or after surgery.  No Smoking including  e-cigarettes for 24 hours prior to surgery.  No chewable tobacco products for at least 6 hours prior to surgery.  No nicotine patches on the day of surgery.  Do not use any "recreational" drugs for at least a week prior to your surgery.  Please be advised that the combination of cocaine and anesthesia may have negative outcomes, up to and including death. If you test positive for cocaine, your surgery will be cancelled.  On the morning of surgery brush your teeth with toothpaste and water, you may rinse your mouth with mouthwash if you wish. Do not swallow any toothpaste or mouthwash.  Do not wear jewelry, make-up, hairpins, clips or nail polish.  Do not wear lotions, powders, or perfumes.   Do not shave body from the neck down 48 hours prior to surgery just in case you cut yourself which could leave a site for infection.  Also, freshly shaved skin may become irritated if using the CHG soap.  Contact lenses, hearing aids and dentures may not be worn into surgery.  Do not bring valuables to the hospital. [facility] is not responsible for any missing/lost belongings or valuables.   Notify your doctor if there is any change in your medical condition (cold, fever, infection).  Wear comfortable clothing (specific to your surgery type) to the hospital.  Plan for stool softeners for home use; pain medications have a tendency to cause constipation. You can also help prevent constipation by eating foods high in fiber such as fruits and vegetables and drinking plenty of fluids as your diet allows.  After surgery,  you can help prevent lung complications by doing breathing exercises.  Take deep breaths and cough every 1-2 hours. Your doctor may order a device called an Incentive Spirometer to help you take deep breaths. When coughing or sneezing, hold a pillow firmly against your incision with both hands. This is called "splinting." Doing this helps protect your incision. It also decreases belly  discomfort.  If you are being admitted to the hospital overnight, leave your suitcase in the car. After surgery it may be brought to your room.  If you are being discharged the day of surgery, you will not be allowed to drive home. You will need a responsible adult (18 years or older) to drive you home and stay with you that night.   If you are taking public transportation, you will need to have a responsible adult (18 years or older) with you. Please confirm with your physician that it is acceptable to use public transportation.   Please call the Pre-admissions Testing Dept. at [phone] if you have any questions about these instructions.  Surgery Visitation Policy:  Patients undergoing a surgery or procedure may have one family member or support person with them as long as that person is not COVID-19 positive or experiencing its symptoms.  That person may remain in the waiting area during the procedure.  Inpatient Visitation:    Visiting hours are 7 a.m. to 8 p.m. Inpatients will be allowed two visitors daily. The visitors may change each day during the patient's stay. No visitors under the age of 86. Any visitor under the age of 28 must be accompanied by an adult. The visitor must pass COVID-19 screenings, use hand sanitizer when entering and exiting the patient's room and wear a mask at all times, including in the patient's room. Patients must also wear a mask when staff or their visitor are in the room. Masking is required regardless of vaccination status.

## 2020-12-03 ENCOUNTER — Encounter: Payer: Self-pay | Admitting: Nurse Practitioner

## 2020-12-05 ENCOUNTER — Encounter: Payer: Self-pay | Admitting: Surgery

## 2020-12-05 NOTE — Progress Notes (Signed)
Perioperative Services  Pre-Admission/Anesthesia Testing Clinical Review  Date: 12/05/20  Patient Demographics:  Name: Andrew Potts DOB:   1951/02/16 MRN:   [id number]  Planned Surgical Procedure(s):    Case: [id number] Date/Time: 12/07/20 1045   Procedure: XI ROBOTIC ASSISTED LAPAROSCOPIC CHOLECYSTECTOMY (N/A )   Anesthesia type: General   Pre-op diagnosis: chronic cholecystitis   Location: [facility] OR ROOM 04 / [facility]   Surgeons: [name], MD    NOTE: Available PAT nursing documentation and vital signs have been reviewed. Clinical nursing staff has updated patient's PMH/PSHx, current medication list, and drug allergies/intolerances to ensure comprehensive history available to assist in medical decision making as it pertains to the aforementioned surgical procedure and anticipated anesthetic course.   Clinical Discussion:  Andrew Potts is a 70 y.o. male who is submitted for pre-surgical anesthesia review and clearance prior to him undergoing the above procedure. Patient is a Former Smoker (23.5 pack years; quit 07/2020). Pertinent PMH includes: CAD, STEMI, cardiac arrest, angina, extensive aortic atherosclerosis, CHF, HTN, HLD, COPD, GERD (on daily PPI), OA, HCV.  Patient is followed by cardiology [name], MD). He was last seen in the cardiology clinic on 11/29/2020; notes reviewed.  At the time of his clinic visit, patient doing "fairly well" from a cardiovascular perspective.  He denied any chest pain (angina), shortness of breath, PND, orthopnea, palpitations, peripheral edema, vertiginous symptoms, or presyncope/syncope.  Patient did complain of bilateral musculoskeletal type pain in his chest walls secondary to known BILATERAL rib fractures.  Patient had stopped smoking as of 07/2020.  Patient with recent significant cardiovascular event as follows:   On 07/28/2020, patient was found unconscious at work.  EMS arrived and found patient to have a non  perfusing rhythm on the monitor (ventricular fibrillation).  Patient pulseless and apneic.  King airway was placed, CPR was performed, and patient received a total of three shocks prior to achieving ROSC.  ECG revealed STEMI.  Patient was taken for an emergent cardiac catheterization which revealed multivessel CAD with high-grade stenosis of the RCA and trifurcation disease in the LCx; LVEF 40-45%. DES x 1 placed to the mid RCA with overlapping DES stents placed to the distal RCA restoring TIMI-3 flow (see full interpretation of cardiovascular testing and interventions below).   Patient doing well overall following his STEMI; no recurrent chest pain necessitating use of prescribed NTG.  Patient remains on daily DAPT therapy (ASA + prasugrel); compliant with therapy with no evidence of GI bleeding.  Patient currently taking a PDE5i (sildenafil) medication for his erectile dysfunction diagnosis.  Patient on GDMT for his HTN and HLD diagnoses.  Blood pressure was elevated at 162/64 on currently prescribed nitrate, ACEi, and beta-blocker therapies.  He is on a statin for his HLD. Functional capacity, as defined by DASI, is documented as being >/= 4 METS.  Patient to follow-up with outpatient cardiology in 6 months or sooner if needed.  Patient is scheduled for a robot-assisted laparoscopic cholecystectomy on 12/07/2020 with Dr. [name].  Patient currently has a cholecystostomy tube in place.  Given patient's past medical history significant for cardiovascular diagnoses, presurgical cardiac clearance was sought by the performing surgeon's office and PAT team. Per cardiology, "this patient is optimized for surgery and may proceed with the planned procedural course with an ACCEPTABLE risk stratification".  Again, this patient is on daily DAPT therapy.  He has been instructed on recommendations for holding both his daily low-dose ASA and prasugrel for 5 days prior  to his procedure with plans to restart as soon  as possible following procedure when bleeding risk felt to be minimized by primary attending surgeon.  Patient is aware that his last dose of his DAPT medications will be on 12/01/2020.  Patient denies previous perioperative complications with anesthesia in the past.  There are no surgical/anesthetic records available for review and the [facility] system.  Vitals with BMI 11/29/2020 11/25/2020 11/25/2020  Height 5\' 7"  - -  Weight 155 lbs 13 oz - 157 lbs  BMI 24.4 - 24.58  Systolic 132 134  Diastolic 68 75 92  Pulse 80 59 63    Providers/Specialists:   NOTE: Primary physician provider listed below. Patient may have been seen by APP or partner within same practice.   PROVIDER ROLE / SPECIALTY LAST 233, MD  General Surgery  11/29/2020  12/01/2020, NP  Primary Care Provider  11/25/2020  11/27/2020, MD  Cardiology  11/29/2020   Allergies:  Levaquin [levofloxacin], Codeine, and Codone [hydrocodone]  Current Home Medications:   No current facility-administered medications for this encounter.   12/01/2020 aspirin EC 81 MG EC tablet  . atorvastatin (LIPITOR) 80 MG tablet  . Ensure (ENSURE)  . isosorbide mononitrate (IMDUR) 30 MG 24 hr tablet  . lisinopril (ZESTRIL) 5 MG tablet  . metoprolol tartrate (LOPRESSOR) 25 MG tablet  . nitroGLYCERIN (NITROSTAT) 0.4 MG SL tablet  . pantoprazole (PROTONIX) 40 MG tablet  . prasugrel (EFFIENT) 10 MG TABS tablet  . PROAIR HFA 108 (90 Base) MCG/ACT inhaler  . sildenafil (REVATIO) 20 MG tablet  . SYMBICORT 160-4.5 MCG/ACT inhaler   History:   Past Medical History:  Diagnosis Date  . Anginal pain (HCC)   . Aortic atherosclerosis (HCC)    "extensive" per CT imaging on 09/20/2020  . Arthritis   . Cardiac arrest with successful resuscitation (HCC) 07/28/2020   V.fib arrest in the setting of acute STEMI  . CHF (congestive heart failure) (HCC)   . COPD (chronic obstructive pulmonary disease) (HCC)   . Coronary artery  disease   . Diverticulosis of colon 09/20/2020   multifocal  . Duodenal diverticulum 09/20/2020  . Erectile dysfunction    takes PDE5i (sildenafil) medication  . GERD (gastroesophageal reflux disease)   . HCV (hepatitis C virus)   . HLD (hyperlipidemia)   . Hypertension   . Myocardial infarction (HCC) 07/28/2020   STEMI --> PCI for high grade RCA disease  . Pneumonia   . Status post insertion of drug eluting coronary artery stent 07/28/2020   DES x 1 to mRCA with overlapping stents to the dRCA   Past Surgical History:  Procedure Laterality Date  . ABDOMINAL EXPLORATION SURGERY    . COLONOSCOPY    . CORONARY/GRAFT ACUTE MI REVASCULARIZATION N/A 07/28/2020   Procedure: CORONARY/GRAFT ACUTE MI REVASCULARIZATION;  Surgeon: 07/30/2020, MD;  Location: [facility] INVASIVE CV LAB;  Service: Cardiovascular;  Laterality: N/A;  . LEFT HEART CATH AND CORONARY ANGIOGRAPHY N/A 07/28/2020   Procedure: LEFT HEART CATH AND CORONARY ANGIOGRAPHY;  Surgeon: 07/30/2020, MD;  Location: [facility] INVASIVE CV LAB;  Service: Cardiovascular;  Laterality: N/A;  . NASAL SINUS SURGERY     Family History  Problem Relation Age of Onset  . Alcohol abuse Father   . Alcohol abuse Brother   . Aneurysm Brother    Social History   Tobacco Use  . Smoking status: Former Smoker    Packs/day: 0.50    Years:  47.00    Pack years: 23.50    Types: Cigarettes    Quit date: 07/28/2020    Years since quitting: 0.3  . Smokeless tobacco: Never Used  . Tobacco comment: 08/24/20 given quit smoking paperwork and encouraged continued cessation  Vaping Use  . Vaping Use: Never used  Substance Use Topics  . Alcohol use: Not Currently  . Drug use: Never    Pertinent Clinical Results:  LABS: Labs reviewed: Acceptable for surgery.  Lab Results  Component Value Date   WBC 9.8 09/28/2020   HGB 13.9 09/28/2020   HCT 41.3 09/28/2020   MCV 98 (H) 09/28/2020   PLT 333 09/28/2020   Lab Results  Component Value Date    NA 137 09/28/2020   K 4.0 10/05/2020   CO2 20 09/28/2020   BUN 11 09/28/2020   CREATININE 0.77 09/28/2020   CALCIUM 9.6 09/28/2020   GLUCOSE 87 09/28/2020    ECG: Date: 09/20/2020 Time ECG obtained: 0212 AM Rate: 67 bpm Rhythm: normal sinus Axis (leads I and aVF): Normal Intervals: QRS 85 ms. QTc 450 ms. ST segment and T wave changes: Nonspecific inferior T wave abnormalities  Comparison: Inferior T wave inversions noted on 07/29/2020 tracing   IMAGING / PROCEDURES: NUCLEAR MEDICINE HEPATOBILIARY IMAGING (HIDA SCAN) performed on 09/21/2020 1. Nonvisualization of the gallbladder despite examination augmentation with morphine.   2. Findings suggestive of cystic duct occlusion/acute cholecystitis  ULTRASOUND ABDOMEN LIMITED RUQ performed on 09/20/2020 1. Thickened gallbladder wall with some surrounding fluid 2. No visible stone or sludge 3. No Murphy sign 4. Is a clinical concern persist, consider nuclear medicine hepatobiliary scan 5. Acalculous cholecystitis not excluded based on imaging  CTA CHEST, ABDOMEN, PELVIS FOR DISSECTION performed on 09/20/2020 1. Negative for aortic dissection or aneurysm 2. Extensive aortic atherosclerosis 3. Questionable gallbladder wall inflammation.  Consider follow-up with RUQ ultrasound in this clinical setting. 4. Otherwise no acute or inflammatory process identified in the chest, abdomen, or pelvis 5. Extensive bilateral rib fractures, some of which remain incompletely healed 6. Heterogeneous hepatic steatosis suspected, along with small benign cyst or hemangioma in the right lobe of the liver 7. Multifocal diverticulosis of the colon without active inflammation 8. Duodenal diverticulum  LEFT HEART CATHETERIZATION AND CORONARY ANGIOGRAPHY performed on 07/28/2020 1. Multivessel CAD  CTO of the proximal RCA to mid RCA  75% stenosis proximal circumflex  90% stenosis mid circumflex  90% stenosis second marginal  90% stenosis mid  circumflex to distal circumflex  50% stenosis first diagonal  40% stenosis proximal LAD to mid LAD 2. Mildly reduced left ventricular systolic function; LVEF 40-45% 3. Successful PCI with DES x 1 to mid RCA with overlapping DES stents to distal RCA restoring TIMI-3 flow     Impression and Plan:  Andrew Potts has been referred for pre-anesthesia review and clearance prior to him undergoing the planned anesthetic and procedural courses. Available labs, pertinent testing, and imaging results were personally reviewed by me. This patient has been appropriately cleared by cardiology with an overall ACCEPTABLE risk of significant perioperative cardiovascular complications.  Based on clinical review performed today (12/05/20), barring any significant acute changes in the patient's overall condition, it is anticipated that he will be able to proceed with the planned surgical intervention. Any acute changes in clinical condition may necessitate his procedure being postponed and/or cancelled. Patient will meet with anesthesia team (MD and/or CRNA) on the day of his procedure for preoperative evaluation/assessment. Questions regarding anesthetic course will be fielded at  that time.   Pre-surgical instructions were reviewed with the patient during his PAT appointment and questions were fielded by PAT clinical staff. Patient was advised that if any questions or concerns arise prior to his procedure then he should return a call to PAT and/or his surgeon's office to discuss.  Quentin Mulling, MSN, APRN, FNP-C, CEN [facility]  Peri-operative Services Nurse Practitioner Phone: [phone] 12/05/20 9:20 AM  NOTE: This note has been prepared using Dragon dictation software. Despite my best ability to proofread, there is always the potential that unintentional transcriptional errors may still occur from this process.

## 2020-12-06 MED ORDER — LACTATED RINGERS IV SOLN: INTRAVENOUS | Status: DC

## 2020-12-06 MED ORDER — CEFAZOLIN SODIUM-DEXTROSE 2-4 GM/100ML-% IV SOLN
2.0000 g | INTRAVENOUS | Status: AC
  Administered 2020-12-07: 2 g via INTRAVENOUS

## 2020-12-06 MED ORDER — ORAL CARE MOUTH RINSE: 15.0000 mL | Freq: Once | OROMUCOSAL | Status: AC

## 2020-12-06 MED ORDER — ACETAMINOPHEN 500 MG PO TABS: 1000.0000 mg | ORAL_TABLET | ORAL | Status: AC

## 2020-12-06 MED ORDER — GABAPENTIN 300 MG PO CAPS: 300.0000 mg | ORAL_CAPSULE | ORAL | Status: AC

## 2020-12-06 MED ORDER — CHLORHEXIDINE GLUCONATE 0.12 % MT SOLN: 15.0000 mL | Freq: Once | OROMUCOSAL | Status: AC

## 2020-12-06 MED ORDER — INDOCYANINE GREEN 25 MG IV SOLR
2.5000 mg | Freq: Once | INTRAVENOUS | Status: AC
  Administered 2020-12-07: 2.5 mg via INTRAVENOUS
  Filled 2020-12-06: qty 1

## 2020-12-06 MED ORDER — CHLORHEXIDINE GLUCONATE CLOTH 2 % EX PADS
6.0000 | MEDICATED_PAD | Freq: Once | CUTANEOUS | Status: AC
  Administered 2020-12-07: 6 via TOPICAL

## 2020-12-07 ENCOUNTER — Encounter
Admission: RE | Disposition: A | Discharge status: Home / Self Care | Payer: Self-pay | Source: Home / Self Care | Attending: Surgery

## 2020-12-07 ENCOUNTER — Ambulatory Visit: Payer: Medicare Other | Admitting: Urgent Care

## 2020-12-07 ENCOUNTER — Encounter: Payer: Self-pay | Admitting: Surgery

## 2020-12-07 ENCOUNTER — Ambulatory Visit
Admission: RE | Admit: 2020-12-07 | Discharge: 2020-12-07 | Disposition: A | Discharge status: Home / Self Care | Payer: Medicare Other | Attending: Surgery | Admitting: Surgery

## 2020-12-07 ENCOUNTER — Other Ambulatory Visit: Payer: Self-pay

## 2020-12-07 DIAGNOSIS — J449 Chronic obstructive pulmonary disease, unspecified: Secondary | ICD-10-CM | POA: Diagnosis not present

## 2020-12-07 DIAGNOSIS — K8012 Calculus of gallbladder with acute and chronic cholecystitis without obstruction: Secondary | ICD-10-CM | POA: Insufficient documentation

## 2020-12-07 DIAGNOSIS — Z79899 Other long term (current) drug therapy: Secondary | ICD-10-CM | POA: Insufficient documentation

## 2020-12-07 DIAGNOSIS — I509 Heart failure, unspecified: Secondary | ICD-10-CM | POA: Diagnosis not present

## 2020-12-07 DIAGNOSIS — K828 Other specified diseases of gallbladder: Secondary | ICD-10-CM | POA: Diagnosis not present

## 2020-12-07 DIAGNOSIS — K810 Acute cholecystitis: Secondary | ICD-10-CM

## 2020-12-07 DIAGNOSIS — Z881 Allergy status to other antibiotic agents status: Secondary | ICD-10-CM | POA: Insufficient documentation

## 2020-12-07 DIAGNOSIS — Z8249 Family history of ischemic heart disease and other diseases of the circulatory system: Secondary | ICD-10-CM | POA: Diagnosis not present

## 2020-12-07 DIAGNOSIS — Z7951 Long term (current) use of inhaled steroids: Secondary | ICD-10-CM | POA: Insufficient documentation

## 2020-12-07 DIAGNOSIS — Z888 Allergy status to other drugs, medicaments and biological substances status: Secondary | ICD-10-CM | POA: Insufficient documentation

## 2020-12-07 DIAGNOSIS — Z7982 Long term (current) use of aspirin: Secondary | ICD-10-CM | POA: Insufficient documentation

## 2020-12-07 DIAGNOSIS — Z885 Allergy status to narcotic agent status: Secondary | ICD-10-CM | POA: Insufficient documentation

## 2020-12-07 DIAGNOSIS — K219 Gastro-esophageal reflux disease without esophagitis: Secondary | ICD-10-CM | POA: Insufficient documentation

## 2020-12-07 DIAGNOSIS — K811 Chronic cholecystitis: Secondary | ICD-10-CM | POA: Diagnosis not present

## 2020-12-07 DIAGNOSIS — I110 Hypertensive heart disease with heart failure: Secondary | ICD-10-CM | POA: Insufficient documentation

## 2020-12-07 DIAGNOSIS — K8010 Calculus of gallbladder with chronic cholecystitis without obstruction: Secondary | ICD-10-CM | POA: Diagnosis present

## 2020-12-07 DIAGNOSIS — E785 Hyperlipidemia, unspecified: Secondary | ICD-10-CM | POA: Diagnosis not present

## 2020-12-07 DIAGNOSIS — Z87891 Personal history of nicotine dependence: Secondary | ICD-10-CM | POA: Insufficient documentation

## 2020-12-07 HISTORY — DX: Male erectile dysfunction, unspecified: N52.9

## 2020-12-07 HISTORY — DX: Unspecified viral hepatitis C without hepatic coma: B19.20

## 2020-12-07 HISTORY — PX: CHOLECYSTECTOMY: SHX55

## 2020-12-07 HISTORY — DX: Hyperlipidemia, unspecified: E78.5

## 2020-12-07 HISTORY — DX: Atherosclerosis of aorta: I70.0

## 2020-12-07 SURGERY — CHOLECYSTECTOMY, ROBOT-ASSISTED, LAPAROSCOPIC
Anesthesia: General

## 2020-12-07 MED ORDER — LIDOCAINE HCL (CARDIAC) PF 100 MG/5ML IV SOSY
PREFILLED_SYRINGE | INTRAVENOUS | Status: DC | PRN
  Administered 2020-12-07: 40 mg via INTRAVENOUS

## 2020-12-07 MED ORDER — SUGAMMADEX SODIUM 200 MG/2ML IV SOLN
INTRAVENOUS | Status: DC | PRN
  Administered 2020-12-07: 200 mg via INTRAVENOUS

## 2020-12-07 MED ORDER — DEXAMETHASONE SODIUM PHOSPHATE 10 MG/ML IJ SOLN
INTRAMUSCULAR | Status: DC | PRN
  Administered 2020-12-07: 10 mg via INTRAVENOUS

## 2020-12-07 MED ORDER — KETOROLAC TROMETHAMINE 30 MG/ML IJ SOLN
INTRAMUSCULAR | Status: AC
  Filled 2020-12-07: qty 1

## 2020-12-07 MED ORDER — SUCCINYLCHOLINE CHLORIDE 20 MG/ML IJ SOLN
INTRAMUSCULAR | Status: DC | PRN
  Administered 2020-12-07: 100 mg via INTRAVENOUS

## 2020-12-07 MED ORDER — FENTANYL CITRATE (PF) 100 MCG/2ML IJ SOLN
INTRAMUSCULAR | Status: AC
  Filled 2020-12-07: qty 2

## 2020-12-07 MED ORDER — ROCURONIUM BROMIDE 100 MG/10ML IV SOLN
INTRAVENOUS | Status: DC | PRN
  Administered 2020-12-07: 10 mg via INTRAVENOUS
  Administered 2020-12-07: 30 mg via INTRAVENOUS

## 2020-12-07 MED ORDER — BUPIVACAINE-EPINEPHRINE (PF) 0.25% -1:200000 IJ SOLN
INTRAMUSCULAR | Status: AC
  Filled 2020-12-07: qty 30

## 2020-12-07 MED ORDER — PROPOFOL 10 MG/ML IV BOLUS
INTRAVENOUS | Status: DC | PRN
  Administered 2020-12-07: 170 mg via INTRAVENOUS

## 2020-12-07 MED ORDER — IBUPROFEN 800 MG PO TABS: 800.0000 mg | ORAL_TABLET | Freq: Three times a day (TID) | ORAL | 0 refills | Status: DC | PRN

## 2020-12-07 MED ORDER — DEXAMETHASONE SODIUM PHOSPHATE 10 MG/ML IJ SOLN
INTRAMUSCULAR | Status: AC
  Filled 2020-12-07: qty 1

## 2020-12-07 MED ORDER — CHLORHEXIDINE GLUCONATE 0.12 % MT SOLN
OROMUCOSAL | Status: AC
  Administered 2020-12-07: 15 mL via OROMUCOSAL
  Filled 2020-12-07: qty 15

## 2020-12-07 MED ORDER — BUPIVACAINE LIPOSOME 1.3 % IJ SUSP
INTRAMUSCULAR | Status: AC
  Filled 2020-12-07: qty 20

## 2020-12-07 MED ORDER — BUPIVACAINE-EPINEPHRINE (PF) 0.25% -1:200000 IJ SOLN
INTRAMUSCULAR | Status: DC | PRN
  Administered 2020-12-07: 30 mL

## 2020-12-07 MED ORDER — PROPOFOL 10 MG/ML IV BOLUS
INTRAVENOUS | Status: AC
  Filled 2020-12-07: qty 20

## 2020-12-07 MED ORDER — GABAPENTIN 300 MG PO CAPS
ORAL_CAPSULE | ORAL | Status: AC
  Administered 2020-12-07: 300 mg via ORAL
  Filled 2020-12-07: qty 1

## 2020-12-07 MED ORDER — ONDANSETRON HCL 4 MG/2ML IJ SOLN
INTRAMUSCULAR | Status: AC
  Filled 2020-12-07: qty 2

## 2020-12-07 MED ORDER — FENTANYL CITRATE (PF) 100 MCG/2ML IJ SOLN
25.0000 ug | INTRAMUSCULAR | Status: DC | PRN
  Administered 2020-12-07: 50 ug via INTRAVENOUS
  Administered 2020-12-07 (×2): 25 ug via INTRAVENOUS

## 2020-12-07 MED ORDER — ONDANSETRON HCL 4 MG/2ML IJ SOLN: 4.0000 mg | Freq: Once | INTRAMUSCULAR | Status: DC | PRN

## 2020-12-07 MED ORDER — PHENYLEPHRINE HCL (PRESSORS) 10 MG/ML IV SOLN
INTRAVENOUS | Status: DC | PRN
  Administered 2020-12-07: 100 ug via INTRAVENOUS
  Administered 2020-12-07: 200 ug via INTRAVENOUS
  Administered 2020-12-07: 100 ug via INTRAVENOUS
  Administered 2020-12-07: 200 ug via INTRAVENOUS
  Administered 2020-12-07 (×2): 100 ug via INTRAVENOUS

## 2020-12-07 MED ORDER — SUCCINYLCHOLINE CHLORIDE 200 MG/10ML IV SOSY
PREFILLED_SYRINGE | INTRAVENOUS | Status: AC
  Filled 2020-12-07: qty 10

## 2020-12-07 MED ORDER — KETOROLAC TROMETHAMINE 30 MG/ML IJ SOLN
30.0000 mg | Freq: Once | INTRAMUSCULAR | Status: AC
  Administered 2020-12-07: 30 mg via INTRAVENOUS

## 2020-12-07 MED ORDER — ROCURONIUM BROMIDE 10 MG/ML (PF) SYRINGE
PREFILLED_SYRINGE | INTRAVENOUS | Status: AC
  Filled 2020-12-07: qty 10

## 2020-12-07 MED ORDER — MEPERIDINE HCL 25 MG/ML IJ SOLN: 6.2500 mg | INTRAMUSCULAR | Status: DC | PRN

## 2020-12-07 MED ORDER — ACETAMINOPHEN 500 MG PO TABS
ORAL_TABLET | ORAL | Status: AC
  Administered 2020-12-07: 1000 mg via ORAL
  Filled 2020-12-07: qty 2

## 2020-12-07 MED ORDER — CEFAZOLIN SODIUM-DEXTROSE 2-4 GM/100ML-% IV SOLN
INTRAVENOUS | Status: AC
  Filled 2020-12-07: qty 100

## 2020-12-07 MED ORDER — FENTANYL CITRATE (PF) 100 MCG/2ML IJ SOLN
INTRAMUSCULAR | Status: DC | PRN
  Administered 2020-12-07: 100 ug via INTRAVENOUS

## 2020-12-07 MED ORDER — ONDANSETRON HCL 4 MG/2ML IJ SOLN
INTRAMUSCULAR | Status: DC | PRN
  Administered 2020-12-07: 4 mg via INTRAVENOUS

## 2020-12-07 SURGICAL SUPPLY — 50 items
ADH SKN CLS APL DERMABOND .7 (GAUZE/BANDAGES/DRESSINGS) ×1
APL PRP STRL LF DISP 70% ISPRP (MISCELLANEOUS) ×1
BAG SPEC RTRVL LRG 6X4 10 (ENDOMECHANICALS) ×1
CANISTER SUCT 1200ML W/VALVE (MISCELLANEOUS) IMPLANT
CANNULA CAP OBTURATR AIRSEAL 8 (CAP) ×2 IMPLANT
CHLORAPREP W/TINT 26 (MISCELLANEOUS) ×2 IMPLANT
CLIP VESOLOCK LG 6/CT PURPLE (CLIP) ×2 IMPLANT
COVER TIP SHEARS 8 DVNC (MISCELLANEOUS) ×1 IMPLANT
COVER TIP SHEARS 8MM DA VINCI (MISCELLANEOUS) ×1
COVER WAND RF STERILE (DRAPES) ×2 IMPLANT
DECANTER SPIKE VIAL GLASS SM (MISCELLANEOUS) ×2 IMPLANT
DEFOGGER SCOPE WARMER CLEARIFY (MISCELLANEOUS) ×2 IMPLANT
DERMABOND ADVANCED (GAUZE/BANDAGES/DRESSINGS) ×1
DERMABOND ADVANCED .7 DNX12 (GAUZE/BANDAGES/DRESSINGS) ×1 IMPLANT
DRAPE ARM DVNC X/XI (DISPOSABLE) ×4 IMPLANT
DRAPE COLUMN DVNC XI (DISPOSABLE) ×1 IMPLANT
DRAPE DA VINCI XI ARM (DISPOSABLE) ×4
DRAPE DA VINCI XI COLUMN (DISPOSABLE) ×1
ELECT CAUTERY BLADE 6.4 (BLADE) ×2 IMPLANT
GLOVE SURG ORTHO LTX SZ7.5 (GLOVE) ×8 IMPLANT
GOWN STRL REUS W/ TWL LRG LVL3 (GOWN DISPOSABLE) ×4 IMPLANT
GOWN STRL REUS W/TWL LRG LVL3 (GOWN DISPOSABLE) ×8
GRASPER SUT TROCAR 14GX15 (MISCELLANEOUS) ×2 IMPLANT
INFUSOR MANOMETER BAG 3000ML (MISCELLANEOUS) IMPLANT
IRRIGATION STRYKERFLOW (MISCELLANEOUS) IMPLANT
IRRIGATOR STRYKERFLOW (MISCELLANEOUS)
IRRIGATOR SUCT 8 DISP DVNC XI (IRRIGATION / IRRIGATOR) IMPLANT
IRRIGATOR SUCTION 8MM XI DISP (IRRIGATION / IRRIGATOR)
IV NS IRRIG 3000ML ARTHROMATIC (IV SOLUTION) IMPLANT
KIT PINK PAD W/HEAD ARE REST (MISCELLANEOUS) ×2
KIT PINK PAD W/HEAD ARM REST (MISCELLANEOUS) ×1 IMPLANT
KIT TURNOVER KIT A (KITS) ×2 IMPLANT
LABEL OR SOLS (LABEL) ×2 IMPLANT
MANIFOLD NEPTUNE II (INSTRUMENTS) ×2 IMPLANT
NEEDLE HYPO 22GX1.5 SAFETY (NEEDLE) ×2 IMPLANT
NEEDLE INSUFFLATION 14GA 120MM (NEEDLE) IMPLANT
NS IRRIG 500ML POUR BTL (IV SOLUTION) ×2 IMPLANT
PACK LAP CHOLECYSTECTOMY (MISCELLANEOUS) ×2 IMPLANT
PENCIL ELECTRO HAND CTR (MISCELLANEOUS) ×2 IMPLANT
POUCH SPECIMEN RETRIEVAL 10MM (ENDOMECHANICALS) ×2 IMPLANT
SCISSORS METZENBAUM CVD 33 (INSTRUMENTS) ×2 IMPLANT
SEAL CANN UNIV 5-8 DVNC XI (MISCELLANEOUS) ×3 IMPLANT
SEAL XI 5MM-8MM UNIVERSAL (MISCELLANEOUS) ×3
SET TUBE FILTERED XL AIRSEAL (SET/KITS/TRAYS/PACK) ×2 IMPLANT
SOLUTION ELECTROLUBE (MISCELLANEOUS) ×2 IMPLANT
SUT MNCRL 4-0 (SUTURE) ×2
SUT MNCRL 4-0 27XMFL (SUTURE) ×1
SUT VICRYL 0 AB UR-6 (SUTURE) ×2 IMPLANT
SUTURE MNCRL 4-0 27XMF (SUTURE) ×1 IMPLANT
TROCAR Z-THREAD FIOS 11X100 BL (TROCAR) ×2 IMPLANT

## 2020-12-07 NOTE — Op Note (Signed)
Robotic cholecystectomy  Pre-operative Diagnosis: History of acute cholecystitis, status post percutaneous cholecystostomy.  Post-operative Diagnosis:  Same.  Procedure: Robotic assisted laparoscopic cholecystectomy.  Surgeon: [name], M.D., FACS  Anesthesia: General. with endotracheal tube  Findings: No unusual or extensive scarring present.  Anterior abdominal wall adhesions, and omental adhesions to drain at exit site from liver.  Otherwise mild chronic inflammatory adhesions from cholecystitis.  Estimated Blood Loss: 5-10 mL         Drains: None         Specimens: Gallbladder           Complications: none  Procedure Details  The patient was seen again in the Holding Room.  2.5 mg dose of ICG was administered intravenously.   The benefits, complications, treatment options, risks and expected outcomes were again reviewed with the patient. The likelihood of improving the patient's symptoms with return to their baseline status is good.  The patient and/or family concurred with the proposed plan, giving informed consent, again alternatives reviewed.  The patient was taken to Operating Room, identified, and the procedure verified as robotic assisted laparoscopic cholecystectomy.  Prior to the induction of general anesthesia, antibiotic prophylaxis was administered. VTE prophylaxis was in place. General endotracheal anesthesia was then administered and tolerated well. The patient was positioned in the supine position.  After the induction, the abdomen was prepped with Chloraprep and draped in the sterile fashion.  A Time Out was held and the above information confirmed.  After local infiltration of quarter percent Marcaine with epinephrine, stab incision was made left upper quadrant.  Just below the costal margin at Palmer's point, approximately midclavicular line the Veres needle is passed with sensation of the layers to penetrate the abdominal wall and into the peritoneum.  Saline  drop test is confirmed peritoneal placement.  Insufflation is initiated with carbon dioxide to pressures of 15 mmHg.  Right infra-umbilical local infiltration with quarter percent Marcaine with epinephrine is utilized.  Made a 12 mm incision on the right periumbilical site, I advanced an optical 51mm port under direct visualization into the peritoneal cavity.  Once the peritoneum was penetrated, insufflation was initiated.  The trocar was then advanced into the abdominal cavity under direct visualization. Pneumoperitoneum was then continued with Air seal utilizing CO2 at 15 mmHg or less and tolerated well without any adverse changes in the patient's vital signs.  Two 8.5-mm ports were placed in the left lower quadrant and laterally, and one to the right lower quadrant, all under direct vision. All skin incisions  were infiltrated with a local anesthetic agent before making the incision and placing the trocars.  Using laparoscopic hot scissors, we lysed the anterior abdominal wall adhesions for adequate manipulation of the robotic instrumentation. The patient was positioned  in reverse Trendelenburg, tilted the patient's left side down.  Da Vinci XI robot was then positioned on to the patient's left side, and docked. The remaining adhesions were lysed approaching the omental wrap around the cholecystostomy drain site.  This was then removed to provide access to the gallbladder fundus, freeing this aspect of the liver. I went ahead and divided the drain from within the abdomen. The gallbladder was identified, the fundus grasped via the arm 4 Prograsp and retracted cephalad. Adhesions were lysed with scissors and cautery.  The infundibulum was identified grasped and retracted laterally, exposing the peritoneum overlying the triangle of Calot. This was then opened and dissected using cautery & scissors. An extended critical view of the cystic duct  and cystic artery was obtained, aided by the ICG via FireFly  which enabled ready visualization of the ductal anatomy.    The cystic duct was clearly identified and dissected to isolation.   The cystic duct was triple clipped and divided with scissors, as close to the gallbladder neck as feasible, thus leaving two on the remaining stump.  The specimen side of the artery is sealed with bipolar and divided with monopolar scissors.   The gallbladder was taken from the gallbladder fossa in a retrograde fashion with the electrocautery. The remaining drain retained in the gallbladder and extracted from its communication with the liver.  The gallbladder was removed and placed in an Endocatch bag.  The liver bed is inspected. Hemostasis was confirmed.  The robot was undocked and moved away from the operative field. Some irrigation was utilized and was aspirated clear.  The gallbladder and Endocatch sac were then removed through the infraumbilical port site.   Inspection of the right upper quadrant was performed. No bleeding, bile duct injury or leak, or bowel injury was noted. The infra-umbilical port site fascia was closed with interrumpted 0 Vicryl suture using PMI/cone under direct visualization. Pneumoperitoneum was released and ports removed.  4-0 subcuticular Monocryl was used to close the skin. Dermabond was  applied.  The patient was then extubated and brought to the recovery room in stable condition. Sponge, lap, and needle counts were correct at closure and at the conclusion of the case.               [name], M.D., [facility] 12/07/2020 12:30 PM

## 2020-12-07 NOTE — Transfer of Care (Signed)
Immediate Anesthesia Transfer of Care Note  Patient: Andrew Potts  Procedure(s) Performed: XI ROBOTIC ASSISTED LAPAROSCOPIC CHOLECYSTECTOMY (N/A ) INDOCYANINE GREEN FLUORESCENCE IMAGING (ICG) (N/A )  Patient Location: PACU  Anesthesia Type:General  Level of Consciousness: awake, alert  and oriented  Airway & Oxygen Therapy: Patient Spontanous Breathing and Patient connected to face mask oxygen  Post-op Assessment: Report given to RN and Post -op Vital signs reviewed and stable  Post vital signs: Reviewed and stable  Last Vitals:  Vitals Value Taken Time  BP 156/86 12/07/20 1230  Temp    Pulse 77 12/07/20 1232  Resp 20 12/07/20 1232  SpO2 100 % 12/07/20 1232  Vitals shown include unvalidated device data.  Last Pain:  Vitals:   12/07/20 0918  TempSrc: Temporal  PainSc: 0-No pain         Complications: No complications documented.

## 2020-12-07 NOTE — Interval H&P Note (Signed)
History and Physical Interval Note:  12/07/2020 10:26 AM  Andrew Potts  has presented today for surgery, with the diagnosis of chronic cholecystitis.  The various methods of treatment have been discussed with the patient and family. After consideration of risks, benefits and other options for treatment, the patient has consented to  Procedure(s): XI ROBOTIC ASSISTED LAPAROSCOPIC CHOLECYSTECTOMY (N/A) as a surgical intervention.  The patient's history has been reviewed, patient examined, no change in status, stable for surgery.  I have reviewed the patient's chart and labs.  Questions were answered to the patient's satisfaction.     [name]

## 2020-12-07 NOTE — Anesthesia Preprocedure Evaluation (Addendum)
Anesthesia Evaluation  Patient identified by MRN, date of birth, ID band Patient awake    Reviewed: Allergy & Precautions, NPO status , Patient's Chart, lab work & pertinent test results  Airway Mallampati: II  TM Distance: >3 FB Neck ROM: Full    Dental  (+) Edentulous Upper, Edentulous Lower   Pulmonary pneumonia, resolved, COPD, former smoker,    Pulmonary exam normal        Cardiovascular hypertension, + angina + CAD, + Past MI, + Cardiac Stents, + Peripheral Vascular Disease and +CHF  Normal cardiovascular exam     Neuro/Psych PSYCHIATRIC DISORDERS negative neurological ROS     GI/Hepatic GERD  Medicated,(+) Hepatitis -, C  Endo/Other  negative endocrine ROS  Renal/GU negative Renal ROS  negative genitourinary   Musculoskeletal  (+) Arthritis , Osteoarthritis,    Abdominal   Peds negative pediatric ROS (+)  Hematology negative hematology ROS (+)   Anesthesia Other Findings Arthritis    CHF (congestive heart failure) (HCC)    COPD (chronic obstructive pulmonary disease) (HCC)    Coronary artery disease  STEMI  Hepatitis  Hep C  Myocardial infarction (HCC)  07/2020  LV had mild reduced left ventricular function with inferior hypo-EF around 40 to 45% Intervention Successful PCI and stent of mid RCA with overlapping DES stents to the distal RCA Cleared By Dr. [name] 11-29-20    Reproductive/Obstetrics negative OB ROS                            Anesthesia Physical Anesthesia Plan  ASA: III  Anesthesia Plan: General   Post-op Pain Management:    Induction: Intravenous  PONV Risk Score and Plan: 2  Airway Management Planned: Oral ETT  Additional Equipment:   Intra-op Plan:   Post-operative Plan: Extubation in OR  Informed Consent:   Plan Discussed with: CRNA, Anesthesiologist and Surgeon  Anesthesia Plan Comments:         Anesthesia Quick Evaluation

## 2020-12-07 NOTE — Discharge Instructions (Signed)

## 2020-12-07 NOTE — Anesthesia Postprocedure Evaluation (Signed)
Anesthesia Post Note  Patient: Andrew Potts  Procedure(s) Performed: XI ROBOTIC ASSISTED LAPAROSCOPIC CHOLECYSTECTOMY (N/A ) INDOCYANINE GREEN FLUORESCENCE IMAGING (ICG) (N/A )  Patient location during evaluation: PACU Anesthesia Type: General Level of consciousness: awake and alert, awake and oriented Pain management: pain level controlled Vital Signs Assessment: post-procedure vital signs reviewed and stable Respiratory status: spontaneous breathing, nonlabored ventilation and respiratory function stable Cardiovascular status: blood pressure returned to baseline and stable Postop Assessment: no apparent nausea or vomiting Anesthetic complications: no   No complications documented.   Last Vitals:  Vitals:   12/07/20 1315 12/07/20 1342  BP: 112/76 (!) 150/81  Pulse: (!) 59 64  Resp: 14 18  Temp: 36.5 C (!) 36.1 C  SpO2: 96% 92%    Last Pain:  Vitals:   12/07/20 1342  TempSrc: Temporal  PainSc: 3                  Manfred Arch

## 2020-12-07 NOTE — Progress Notes (Signed)
PER DR. Claudine Mouton, PATIENT CAN RESUME ASPRIN 81 MG TOMORROW MORNING PER PATIENT SCHEDULE 12/08/2020 Thursday.

## 2020-12-07 NOTE — Anesthesia Procedure Notes (Signed)
Procedure Name: Intubation Date/Time: 12/07/2020 11:12 AM Performed by: Rona Ravens, CRNA Pre-anesthesia Checklist: Patient identified, Patient being monitored, Timeout performed, Emergency Drugs available and Suction available Patient Re-evaluated:Patient Re-evaluated prior to induction Oxygen Delivery Method: Circle system utilized Preoxygenation: Pre-oxygenation with 100% oxygen Induction Type: IV induction Ventilation: Mask ventilation without difficulty Laryngoscope Size: Mac and 4 Grade View: Grade II Tube type: Oral Tube size: 7.5 mm Number of attempts: 1 Airway Equipment and Method: Stylet Placement Confirmation: ETT inserted through vocal cords under direct vision,  positive ETCO2 and breath sounds checked- equal and bilateral Secured at: 22 cm Tube secured with: Tape Dental Injury: Teeth and Oropharynx as per pre-operative assessment

## 2020-12-08 LAB — SURGICAL PATHOLOGY

## 2020-12-09 ENCOUNTER — Ambulatory Visit: Payer: Medicare Other | Admitting: Nurse Practitioner

## 2020-12-15 ENCOUNTER — Other Ambulatory Visit: Payer: Self-pay

## 2020-12-15 ENCOUNTER — Encounter: Payer: Self-pay | Admitting: Surgery

## 2020-12-15 ENCOUNTER — Ambulatory Visit (INDEPENDENT_AMBULATORY_CARE_PROVIDER_SITE_OTHER): Payer: Medicare Other | Admitting: Surgery

## 2020-12-15 VITALS — BP 123/70 | HR 52 | Temp 98.0°F | Ht 67.0 in | Wt 156.2 lb

## 2020-12-15 DIAGNOSIS — Z9049 Acquired absence of other specified parts of digestive tract: Secondary | ICD-10-CM

## 2020-12-15 NOTE — Patient Instructions (Signed)
If you have any concerns or questions, please feel free to call our office.    GENERAL POST-OPERATIVE PATIENT INSTRUCTIONS   WOUND CARE INSTRUCTIONS:  Keep a dry clean dressing on the wound if there is drainage. The initial bandage may be removed after 24 hours.  Once the wound has quit draining you may leave it open to air.  If clothing rubs against the wound or causes irritation and the wound is not draining you may cover it with a dry dressing during the daytime.  Try to keep the wound dry and avoid ointments on the wound unless directed to do so.  If the wound becomes bright red and painful or starts to drain infected material that is not clear, please contact your physician immediately.  If the wound is mildly pink and has a thick firm ridge underneath it, this is normal, and is referred to as a healing ridge.  This will resolve over the next 4-6 weeks.  BATHING: You may shower if you have been informed of this by your surgeon. However, Please do not submerge in a tub, hot tub, or pool until incisions are completely sealed or have been told by your surgeon that you may do so.  DIET:  You may eat any foods that you can tolerate.  It is a good idea to eat a high fiber diet and take in plenty of fluids to prevent constipation.  If you do become constipated you may want to take a mild laxative or take ducolax tablets on a daily basis until your bowel habits are regular.  Constipation can be very uncomfortable, along with straining, after recent surgery.  ACTIVITY:  You are encouraged to cough and deep breath or use your incentive spirometer if you were given one, every 15-30 minutes when awake.  This will help prevent respiratory complications and low grade fevers post-operatively if you had a general anesthetic.  You may want to hug a pillow when coughing and sneezing to add additional support to the surgical area, if you had abdominal or chest surgery, which will decrease pain during these times.   You are encouraged to walk and engage in light activity for the next two weeks.  You should not lift more than 15 pounds, until 01/04/2021 as it could put you at increased risk for complications.  Twenty pounds is roughly equivalent to a plastic bag of groceries. At that time- Listen to your body when lifting, if you have pain when lifting, stop and then try again in a few days. Soreness after doing exercises or activities of daily living is normal as you get back in to your normal routine.  MEDICATIONS:  Try to take narcotic medications and anti-inflammatory medications, such as tylenol, ibuprofen, naprosyn, etc., with food.  This will minimize stomach upset from the medication.  Should you develop nausea and vomiting from the pain medication, or develop a rash, please discontinue the medication and contact your physician.  You should not drive, make important decisions, or operate machinery when taking narcotic pain medication.  SUNBLOCK Use sun block to incision area over the next year if this area will be exposed to sun. This helps decrease scarring and will allow you avoid a permanent darkened area over your incision.     Gallbladder Eating Plan If you have a gallbladder condition, you may have trouble digesting fats. Eating a low-fat diet can help reduce your symptoms, and may be helpful before and after having surgery to remove your gallbladder (cholecystectomy).  Your health care provider may recommend that you work with a diet and nutrition specialist (dietitian) to help you reduce the amount of fat in your diet. What are tips for following this plan? General guidelines  Limit your fat intake to less than 30% of your total daily calories. If you eat around 1,800 calories each day, this is less than 60 grams (g) of fat per day.  Fat is an important part of a healthy diet. Eating a low-fat diet can make it hard to maintain a healthy body weight. Ask your dietitian how much fat, calories, and  other nutrients you need each day.  Eat small, frequent meals throughout the day instead of three large meals.  Drink at least 8-10 cups of fluid a day. Drink enough fluid to keep your urine clear or pale yellow.  Limit alcohol intake to no more than 1 drink a day for nonpregnant women and 2 drinks a day for men. One drink equals 12 oz of beer, 5 oz of wine, or 1 oz of hard liquor. Reading food labels  Check Nutrition Facts on food labels for the amount of fat per serving. Choose foods with less than 3 grams of fat per serving.   Shopping  Choose nonfat and low-fat healthy foods. Look for the words "nonfat," "low fat," or "fat free."  Avoid buying processed or prepackaged foods. Cooking  Cook using low-fat methods, such as baking, broiling, grilling, or boiling.  Cook with small amounts of healthy fats, such as olive oil, grapeseed oil, canola oil, or sunflower oil. What foods are recommended?  All fresh, frozen, or canned fruits and vegetables.  Whole grains.  Low-fat or non-fat (skim) milk and yogurt.  Lean meat, skinless poultry, fish, eggs, and beans.  Low-fat protein supplement powders or drinks.  Spices and herbs. What foods are not recommended?  High-fat foods. These include baked goods, fast food, fatty cuts of meat, ice cream, french toast, sweet rolls, pizza, cheese bread, foods covered with butter, creamy sauces, or cheese.  Fried foods. These include french fries, tempura, battered fish, breaded chicken, fried breads, and sweets.  Foods with strong odors.  Foods that cause bloating and gas. Summary  A low-fat diet can be helpful if you have a gallbladder condition, or before and after gallbladder surgery.  Limit your fat intake to less than 30% of your total daily calories. This is about 60 g of fat if you eat 1,800 calories each day.  Eat small, frequent meals throughout the day instead of three large meals. This information is not intended to replace  advice given to you by your health care provider. Make sure you discuss any questions you have with your health care provider. Document Revised: 02/18/2020 Document Reviewed: 02/18/2020 Elsevier Patient Education  2021 [employer].

## 2020-12-15 NOTE — Progress Notes (Signed)
[facility] POST-OP OFFICE VISIT  12/15/2020  HPI: Andrew Potts is a 70 y.o. male 8 days s/p robotic cholecystectomy.  Has resumed his diet, and has minimal pain at left UQ incision site.  BM's good, and appetite restored.  Denies n/v, F/c. Has resumed all pre-op meds.  Vital signs: BP 123/70   Pulse (!) 52   Temp 98 F (36.7 C) (Oral)   Ht 5\' 7"  (1.702 m)   Wt 156 lb 3.2 oz (70.9 kg)   SpO2 94%   BMI 24.46 kg/m    Physical Exam: Constitutional: Appears well.   Abdomen: Soft and non-tender.   Skin: C/D/I.  Expected ecchymosis.   Assessment/Plan: This is a 70 y.o. male 8 days s/p robotic cholecystectomy.   Patient Active Problem List   Diagnosis Date Noted  . Aortic atherosclerosis (HCC) 10/11/2020  . Cholecystostomy care (HCC) 09/29/2020  . Senile purpura (HCC) 09/28/2020  . Acute cholecystitis 09/21/2020  . Unstable angina (HCC) 09/20/2020  . CAD, multiple vessel 09/20/2020  . History of ST elevation myocardial infarction (STEMI) 07/28/2020  . HLD (hyperlipidemia) 07/28/2020  . Chronic active hepatitis (HCC) 03/11/2019  . Erectile dysfunction 02/04/2019  . COPD (chronic obstructive pulmonary disease) (HCC) 02/04/2019  . Nicotine dependence, cigarettes, w unsp disorders 02/04/2019  . Essential hypertension 02/04/2019    - F/u as needed.  May resume full activity in one month.    02/06/2019 M.D., FACS 12/15/2020, 10:46 AM

## 2020-12-17 NOTE — Progress Notes (Signed)
Patient ID: Andrew Potts, male    DOB: 02-07-1951, 70 y.o.   MRN: [id number]  HPI  Andrew Potts is a 70 y/o male with a history of COPD, CAD (STEMI), arthritis, recent tobacco use and chronic heart failure.   No echo to report  LHC done 07/28/20 showed:  Prox RCA to Mid RCA lesion is 100% stenosed.  Prox Cx lesion is 75% stenosed.  Mid Cx lesion is 90% stenosed.  2nd [name] is 90% stenosed.  Mid Cx to Dist Cx lesion is 90% stenosed.  1st Diag lesion is 50% stenosed.  Prox LAD to Mid LAD lesion is 40% stenosed.  Successful PCI and stent with DES mid to distal RCA   Conclusion Multivessel coronary artery disease on diagnostic cardiac cath during STEMI Mid RCA infarct-related vessel Circumflex was large but had complex Trifurcation disease LAD was large relatively free of disease Left main large relatively free of disease LV had mild reduced left ventricular function with inferior hypo-EF around 40 to 45% Intervention Successful PCI and stent of mid RCA with overlapping DES stents to the distal RCA TIMI-3 flow was restored from TIMI 0 Placed on aspirin and Brilinta Angiomax for an additional 4 hours  Same day surgery 12/07/20. Was in the ED 11/04/20 due to foul smell from his cholecystostomy drainage collection bag. Drainage bag changed and he was released. Admitted 07/28/20 due to cardiac arrest with subsequent CPR and 3 shocks delivered due to being in VF. Cardiology consult obtained. EKG showed STEMI. Negative head CT. Emergent cath with stent placement completed. Discharged after 2 days.   He presents today for a follow-up visit with a chief complaint of minimal fatigue upon moderate exertion. He describes this as chronic in nature having been present for several months. He has associated cough, shortness of breath and gradual weight gain along with this. He denies any difficulty sleeping, dizziness, abdominal distention, palpitations, pedal edema or chest pain.   Still  continues to not smoke and feels "much better" since have his gallbladder recently removed. Feels like he has a good appetite and is not adding any salt to his food.   Past Medical History:  Diagnosis Date  . Anginal pain (HCC)   . Aortic atherosclerosis (HCC)    "extensive" per CT imaging on 09/20/2020  . Arthritis   . Cardiac arrest with successful resuscitation (HCC) 07/28/2020   V.fib arrest in the setting of acute STEMI  . CHF (congestive heart failure) (HCC)   . COPD (chronic obstructive pulmonary disease) (HCC)   . Coronary artery disease   . Diverticulosis of colon 09/20/2020   multifocal  . Duodenal diverticulum 09/20/2020  . Erectile dysfunction    takes PDE5i (sildenafil) medication  . GERD (gastroesophageal reflux disease)   . HCV (hepatitis C virus)   . HLD (hyperlipidemia)   . Hypertension   . Myocardial infarction (HCC) 07/28/2020   STEMI --> PCI for high grade RCA disease  . Pneumonia   . Status post insertion of drug eluting coronary artery stent 07/28/2020   DES x 1 to mRCA with overlapping stents to the dRCA   Past Surgical History:  Procedure Laterality Date  . ABDOMINAL EXPLORATION SURGERY    . COLONOSCOPY    . CORONARY/GRAFT ACUTE MI REVASCULARIZATION N/A 07/28/2020   Procedure: CORONARY/GRAFT ACUTE MI REVASCULARIZATION;  Surgeon: Alwyn Pea, MD;  Location: [facility] INVASIVE CV LAB;  Service: Cardiovascular;  Laterality: N/A;  . LEFT HEART CATH AND CORONARY ANGIOGRAPHY N/A 07/28/2020  Procedure: LEFT HEART CATH AND CORONARY ANGIOGRAPHY;  Surgeon: Alwyn Pea, MD;  Location: [facility] INVASIVE CV LAB;  Service: Cardiovascular;  Laterality: N/A;  . NASAL SINUS SURGERY     Family History  Problem Relation Age of Onset  . Alcohol abuse Father   . Alcohol abuse Brother   . Aneurysm Brother    Social History   Tobacco Use  . Smoking status: Former Smoker    Packs/day: 0.50    Years: 47.00    Pack years: 23.50    Types: Cigarettes    Quit date:  07/28/2020    Years since quitting: 0.3  . Smokeless tobacco: Never Used  . Tobacco comment: 08/24/20 given quit smoking paperwork and encouraged continued cessation  Substance Use Topics  . Alcohol use: Not Currently   Allergies  Allergen Reactions  . Levaquin [Levofloxacin] Swelling  . Codeine Itching    Abdominal pain, nose itching  . Codone [Hydrocodone] Other (See Comments)    Abdominal pain, felt loopy   Prior to Admission medications   Medication Sig Start Date End Date Taking? Authorizing Provider  aspirin EC 81 MG EC tablet Take 1 tablet (81 mg total) by mouth daily. Swallow whole. 07/31/20  Yes Cipriano Bunker, MD  atorvastatin (LIPITOR) 80 MG tablet Take 1 tablet (80 mg total) by mouth daily. 07/31/20  Yes Cipriano Bunker, MD  Ensure (ENSURE) Take 237 mLs by mouth daily.   Yes [provider]  isosorbide mononitrate (IMDUR) 30 MG 24 hr tablet Take 1 tablet (30 mg total) by mouth daily. 09/23/20  Yes Arnetha Courser, MD  lisinopril (ZESTRIL) 5 MG tablet Take 1 tablet (5 mg total) by mouth daily. 10/06/20  Yes [name], Jolene T, NP  metoprolol tartrate (LOPRESSOR) 25 MG tablet Take 0.5 tablets (12.5 mg total) by mouth 2 (two) times daily. 07/30/20  Yes Cipriano Bunker, MD  nitroGLYCERIN (NITROSTAT) 0.4 MG SL tablet Place 1 tablet (0.4 mg total) under the tongue every 5 (five) minutes as needed for chest pain. 07/30/20  Yes Cipriano Bunker, MD  pantoprazole (PROTONIX) 40 MG tablet Take 1 tablet (40 mg total) by mouth daily. 09/23/20  Yes Arnetha Courser, MD  prasugrel (EFFIENT) 10 MG TABS tablet Take 10 mg by mouth daily. 09/05/20  Yes [provider]  PROAIR HFA 108 (90 Base) MCG/ACT inhaler Inhale 2 puffs into the lungs every 4 (four) hours as needed. 06/24/20  Yes [name], Jolene T, NP  sildenafil (REVATIO) 20 MG tablet Take 20 mg by mouth daily as needed (erectile dysfunction).   Yes [provider]  SYMBICORT 160-4.5 MCG/ACT inhaler Inhale 2 puffs into the lungs 2  (two) times daily. 04/28/20  Yes [name], NP    Review of Systems  Constitutional: Positive for fatigue. Negative for appetite change.  HENT: Negative for congestion, postnasal drip and sore throat.   Eyes: Negative.   Respiratory: Positive for cough and shortness of breath ("very little").   Cardiovascular: Negative for chest pain, palpitations and leg swelling.  Gastrointestinal: Negative for abdominal distention and abdominal pain.  Endocrine: Negative.   Genitourinary: Negative.   Musculoskeletal: Positive for arthralgias (rib pain).  Allergic/Immunologic: Negative.   Neurological: Negative for dizziness and light-headedness.  Hematological: Negative for adenopathy. Does not bruise/bleed easily.  Psychiatric/Behavioral: Negative for dysphoric mood and sleep disturbance (sleeping on 1 pillow). The patient is not nervous/anxious.    Vitals:   12/19/20 1036  BP: 127/79  Pulse: 75  Resp: 18  SpO2: 95%  Weight:  157 lb 2 oz (71.3 kg)  Height: 5\' 7"  (1.702 m)   Wt Readings from Last 3 Encounters:  12/19/20 157 lb 2 oz (71.3 kg)  12/15/20 156 lb 3.2 oz (70.9 kg)  11/29/20 155 lb 12.8 oz (70.7 kg)   Lab Results  Component Value Date   CREATININE 0.77 09/28/2020   CREATININE 0.62 09/21/2020   CREATININE 0.70 09/20/2020    Physical Exam Vitals and nursing note reviewed.  Constitutional:      Appearance: Normal appearance.  HENT:     Head: Normocephalic and atraumatic.  Cardiovascular:     Rate and Rhythm: Normal rate and regular rhythm.  Pulmonary:     Effort: Pulmonary effort is normal. No respiratory distress.     Breath sounds: No wheezing or rales.  Abdominal:     General: There is no distension.     Palpations: Abdomen is soft.  Musculoskeletal:        General: No tenderness.     Cervical back: Normal range of motion and neck supple.     Right lower leg: No edema.     Left lower leg: No edema.  Skin:    General: Skin is warm and dry.  Neurological:      General: No focal deficit present.     Mental Status: He is alert and oriented to person, place, and time.  Psychiatric:        Mood and Affect: Mood normal.        Behavior: Behavior normal.        Thought Content: Thought content normal.    Assessment & Plan:  1: Chronic heart failure with mildly reduced ejection fraction- - NYHA class II - euvolemic today - weighing daily; reminded to call for an overnight weight gain of > 2 pounds or a weekly weight gain of > 5 pounds - weight up 5 pounds from last visit here 4 months ago - not adding salt and has been reading food labels for sodium content; reviewed the importance of keeping daily sodium intake to ~ 2000mg / day  - saw cardiology (Fath) 11/29/20; returns Nov 2022 - echo has been scheduled for 01/24/21 - should EF remain low, consider changing lisinopril to entresto - BNP 07/28/20 was 37.9 - PharmD reconciled medications with the patient  2: COPD- - saw PCP 03/27/21) 11/25/20 - BMP 09/28/20 reviewed and showed sodium 137, potassium 5.9 (repeat K was 4.0), creatinine 0.77 and GFR 98 - has not smoked since his recent admission (07/28/20) and he was congratulated on that  3: STEMI- - stent placement Jan 2022 - started at cardiac rehab but then had to stop due to drainage tube that was in; he says that since his surgery, he's been walking more on his own and lifting some 8 pound dumbbells at home; says that he thinks he's going to continue doing that instead of going back to cardiac rehab - is hoping to go back to work soon; works at 07/30/20   Medication bottles reviewed.   Return in 7 weeks after echo, sooner if needed for any questions/problems.

## 2020-12-19 ENCOUNTER — Encounter: Payer: Self-pay | Admitting: Pharmacist

## 2020-12-19 ENCOUNTER — Encounter: Payer: Self-pay | Admitting: Family

## 2020-12-19 ENCOUNTER — Other Ambulatory Visit: Payer: Self-pay

## 2020-12-19 ENCOUNTER — Ambulatory Visit: Payer: Medicare Other | Attending: Family | Admitting: Family

## 2020-12-19 VITALS — BP 127/79 | HR 75 | Resp 18 | Ht 67.0 in | Wt 157.1 lb

## 2020-12-19 DIAGNOSIS — I252 Old myocardial infarction: Secondary | ICD-10-CM | POA: Diagnosis not present

## 2020-12-19 DIAGNOSIS — I110 Hypertensive heart disease with heart failure: Secondary | ICD-10-CM | POA: Insufficient documentation

## 2020-12-19 DIAGNOSIS — I2510 Atherosclerotic heart disease of native coronary artery without angina pectoris: Secondary | ICD-10-CM | POA: Diagnosis not present

## 2020-12-19 DIAGNOSIS — Z7951 Long term (current) use of inhaled steroids: Secondary | ICD-10-CM | POA: Insufficient documentation

## 2020-12-19 DIAGNOSIS — Z955 Presence of coronary angioplasty implant and graft: Secondary | ICD-10-CM | POA: Diagnosis not present

## 2020-12-19 DIAGNOSIS — E785 Hyperlipidemia, unspecified: Secondary | ICD-10-CM | POA: Insufficient documentation

## 2020-12-19 DIAGNOSIS — I5022 Chronic systolic (congestive) heart failure: Secondary | ICD-10-CM | POA: Diagnosis not present

## 2020-12-19 DIAGNOSIS — J449 Chronic obstructive pulmonary disease, unspecified: Secondary | ICD-10-CM | POA: Insufficient documentation

## 2020-12-19 DIAGNOSIS — I2111 ST elevation (STEMI) myocardial infarction involving right coronary artery: Secondary | ICD-10-CM

## 2020-12-19 DIAGNOSIS — Z8674 Personal history of sudden cardiac arrest: Secondary | ICD-10-CM | POA: Insufficient documentation

## 2020-12-19 DIAGNOSIS — Z87891 Personal history of nicotine dependence: Secondary | ICD-10-CM | POA: Diagnosis not present

## 2020-12-19 NOTE — Progress Notes (Addendum)
[facility] - HEART FAILURE CLINIC - PHARMACIST COUNSELING NOTE  Guideline-Directed Medical Therapy/Evidence Based Medicine  ACE/ARB/ARNI: Lisinopril 5 mg daily Beta Blocker: Metoprolol tartrate 12.5 mg twice daily Aldosterone Antagonist: None Diuretic: None SGLT2i: None  Adherence Assessment  Do you ever forget to take your medication? [] Yes [x] No  Do you ever skip doses due to side effects? [] Yes [x] No  Do you have trouble affording your medicines? [] Yes [x] No  Are you ever unable to pick up your medication due to transportation difficulties? [] Yes [x] No  Do you ever stop taking your medications because you don't believe they are helping? [] Yes [x] No  Do you check your weight daily? [x] Yes [] No   Adherence strategy: Pt endorses adherence by keeping his medication bottles out and follows his list.   Barriers to obtaining medications: N/A  Vital signs: HR 75, BP 127/79, weight (pounds) 157 ECHO: Date N/A Cath: Date 07/28/2020, EF 40-45%, notes:  Prox RCA to Mid RCA lesion is 100% stenosed.  Prox Cx lesion is 75% stenosed.  Mid Cx lesion is 90% stenosed.  2nd [name] is 90% stenosed.  Mid Cx to Dist Cx lesion is 90% stenosed.  1st Diag lesion is 50% stenosed.  Prox LAD to Mid LAD lesion is 40% stenosed.  Successful PCI and stent with DES mid to distal RCA   BMP Latest Ref Rng & Units 10/05/2020 09/28/2020 09/21/2020  Glucose 65 - 99 mg/dL - 87 )  BUN 8 - 27 mg/dL - 11 8  Creatinine - 1.27 mg/dL -   BUN/Creat Ratio 10 - 24 - 14 -  Sodium 134 - 144 mmol/L - 137 136  Potassium 3.5 - 5.2 mmol/L 4.0 5.9(H) 4.0  Chloride 96 - 106 mmol/L - 101 101  CO2 20 - 29 mmol/L - 20 26  Calcium 8.6 - 10.2 mg/dL - 9.6 9.0    Past Medical History:  Diagnosis Date  . Anginal pain (HCC)   . Aortic atherosclerosis (HCC)    "extensive" per CT imaging on 09/20/2020  . Arthritis   . Cardiac arrest with successful resuscitation (HCC)  07/28/2020   V.fib arrest in the setting of acute STEMI  . CHF (congestive heart failure) (HCC)   . COPD (chronic obstructive pulmonary disease) (HCC)   . Coronary artery disease   . Diverticulosis of colon 09/20/2020   multifocal  . Duodenal diverticulum 09/20/2020  . Erectile dysfunction    takes PDE5i (sildenafil) medication  . GERD (gastroesophageal reflux disease)   . HCV (hepatitis C virus)   . HLD (hyperlipidemia)   . Hypertension   . Myocardial infarction (HCC) 07/28/2020   STEMI --> PCI for high grade RCA disease  . Pneumonia   . Status post insertion of drug eluting coronary artery stent 07/28/2020   DES x 1 to mRCA with overlapping stents to the dRCA    ASSESSMENT 70 year old male who presents to the HF clinic for a follow up visit. Pt reports using his NTG x3 09/20/2020 with no relief and went to the hospital. He reports there was an issue with his gallbladder. Pt states he recently stopped his DAPT therapy for 5 days for a procedure - he had his gallbladder removed 12/07/2020 and states he is doing much better now. Pt has since restarted his DAPT therapy and inquired about how long he would need to stay on it. Pt states he has a follow up appointment with his cardiologist sometime in November.  Recent ED Visit (past  6 months): Date - 11/04/2020, [facility] - acute cholecystitis  PLAN CHF/HTN -continue lisinopril 5 mg daily, isosorbide 30 mg daily, and metoprolol tartrate 12.5 mg twice daily -check weight and BP daily -consider switching lisinopril to Entresto - would need 36 hour washout period -provider ordered echo for 01/2021  HLD/STEMI -continue aspirin 81 mg daily and prasugrel 10 mg daily  -follow up with cardiologist for LOT  COPD -continue albuterol PRN and Symbicort 2 puffs twice daily   GERD -continue pantoprazole 40 mg daily    Time spent: 10 minutes  Raiford Noble, PharmD Pharmacy Resident  12/19/2020 11:03 AM    Current Outpatient Medications:  .   aspirin EC 81 MG EC tablet, Take 1 tablet (81 mg total) by mouth daily. Swallow whole., Disp: 30 tablet, Rfl: 11 .  atorvastatin (LIPITOR) 80 MG tablet, Take 1 tablet (80 mg total) by mouth daily., Disp: 90 tablet, Rfl: 1 .  Ensure (ENSURE), Take 237 mLs by mouth daily., Disp: , Rfl:  .  isosorbide mononitrate (IMDUR) 30 MG 24 hr tablet, Take 1 tablet (30 mg total) by mouth daily., Disp: 30 tablet, Rfl: 0 .  lisinopril (ZESTRIL) 5 MG tablet, Take 1 tablet (5 mg total) by mouth daily., Disp: 90 tablet, Rfl: 4 .  metoprolol tartrate (LOPRESSOR) 25 MG tablet, Take 0.5 tablets (12.5 mg total) by mouth 2 (two) times daily., Disp: 60 tablet, Rfl: 1 .  nitroGLYCERIN (NITROSTAT) 0.4 MG SL tablet, Place 1 tablet (0.4 mg total) under the tongue every 5 (five) minutes as needed for chest pain., Disp: 30 tablet, Rfl: 12 .  pantoprazole (PROTONIX) 40 MG tablet, Take 1 tablet (40 mg total) by mouth daily., Disp: 90 tablet, Rfl: 0 .  prasugrel (EFFIENT) 10 MG TABS tablet, Take 10 mg by mouth daily., Disp: , Rfl:  .  PROAIR HFA 108 (90 Base) MCG/ACT inhaler, Inhale 2 puffs into the lungs every 4 (four) hours as needed., Disp: 18 g, Rfl: 4 .  sildenafil (REVATIO) 20 MG tablet, Take 20 mg by mouth daily as needed (erectile dysfunction)., Disp: , Rfl:  .  SYMBICORT 160-4.5 MCG/ACT inhaler, Inhale 2 puffs into the lungs 2 (two) times daily., Disp: 10.2 g, Rfl: 5   DRUGS TO CAUTION IN HEART FAILURE  Drug or Class Mechanism  Analgesics . NSAIDs . COX-2 inhibitors . Glucocorticoids  Sodium and water retention, increased systemic vascular resistance, decreased response to diuretics   Diabetes Medications . Metformin . Thiazolidinediones o Rosiglitazone (Avandia) o Pioglitazone (Actos) . DPP4 Inhibitors o Saxagliptin (Onglyza) o Sitagliptin (Januvia)   Lactic acidosis Possible calcium channel blockade   Unknown  Antiarrhythmics . Class I  o Flecainide o Disopyramide . Class  III o Sotalol . Other o Dronedarone  Negative inotrope, proarrhythmic   Proarrhythmic, beta blockade  Negative inotrope  Antihypertensives . Alpha Blockers o Doxazosin . Calcium Channel Blockers o Diltiazem o Verapamil o Nifedipine . Central Alpha Adrenergics o Moxonidine . Peripheral Vasodilators o Minoxidil  Increases renin and aldosterone  Negative inotrope    Possible sympathetic withdrawal  Unknown  Anti-infective . Itraconazole . Amphotericin B  Negative inotrope Unknown  Hematologic . Anagrelide . Cilostazol   Possible inhibition of PD IV Inhibition of PD III causing arrhythmias  Neurologic/Psychiatric . Stimulants . Anti-Seizure Drugs o Carbamazepine o Pregabalin . Antidepressants o Tricyclics o Citalopram . Parkinsons o Bromocriptine o Pergolide o Pramipexole . Antipsychotics o Clozapine . Antimigraine o Ergotamine o Methysergide . Appetite suppressants . Bipolar o Lithium  Peripheral  alpha and beta agonist activity  Negative inotrope and chronotrope Calcium channel blockade  Negative inotrope, proarrhythmic Dose-dependent QT prolongation  Excessive serotonin activity/valvular damage Excessive serotonin activity/valvular damage Unknown  IgE mediated hypersensitivy, calcium channel blockade  Excessive serotonin activity/valvular damage Excessive serotonin activity/valvular damage Valvular damage  Direct myofibrillar degeneration, adrenergic stimulation  Antimalarials . Chloroquine . Hydroxychloroquine Intracellular inhibition of lysosomal enzymes  Urologic Agents . Alpha Blockers o Doxazosin o Prazosin o Tamsulosin o Terazosin  Increased renin and aldosterone  Adapted from Page RL, et al. "Drugs That May Cause or Exacerbate Heart Failure: A Scientific Statement from the American Heart  Association." Circulation 2016; 134:e32-e69. DOI: 10.1161/CIR.0000000000000426   MEDICATION ADHERENCES TIPS AND STRATEGIES 1. Taking  medication as prescribed improves patient outcomes in heart failure (reduces hospitalizations, improves symptoms, increases survival) 2. Side effects of medications can be managed by decreasing doses, switching agents, stopping drugs, or adding additional therapy. Please let someone in the Heart Failure Clinic know if you have having bothersome side effects so we can modify your regimen. Do not alter your medication regimen without talking to [place].  3. Medication reminders can help patients remember to take drugs on time. If you are missing or forgetting doses you can try linking behaviors, using pill boxes, or an electronic reminder like an alarm on your phone or an app. Some people can also get automated phone calls as medication reminders.

## 2020-12-19 NOTE — Patient Instructions (Signed)
Continue weighing daily and call for an overnight weight gain of > 2 pounds or a weekly weight gain of >5 pounds. 

## 2020-12-22 ENCOUNTER — Telehealth: Payer: Self-pay | Admitting: Surgery

## 2020-12-22 ENCOUNTER — Other Ambulatory Visit: Payer: Self-pay | Admitting: Surgery

## 2020-12-22 MED ORDER — PANTOPRAZOLE SODIUM 40 MG PO TBEC: 40.0000 mg | DELAYED_RELEASE_TABLET | Freq: Every day | ORAL | 0 refills | Status: DC

## 2020-12-22 NOTE — Telephone Encounter (Signed)
Patient is calling and is asking if he can get a refill on pantoprazole patient is [place] [address] in [place]. Please call patient and advise.

## 2020-12-22 NOTE — Telephone Encounter (Signed)
Pt notified Verbalizes understanding 

## 2020-12-28 ENCOUNTER — Other Ambulatory Visit: Payer: Self-pay | Admitting: Nurse Practitioner

## 2020-12-29 ENCOUNTER — Other Ambulatory Visit: Payer: Self-pay

## 2020-12-29 ENCOUNTER — Ambulatory Visit (INDEPENDENT_AMBULATORY_CARE_PROVIDER_SITE_OTHER): Payer: Medicare Other | Admitting: Nurse Practitioner

## 2020-12-29 ENCOUNTER — Encounter: Payer: Self-pay | Admitting: Nurse Practitioner

## 2020-12-29 VITALS — BP 117/65 | HR 64 | Temp 98.3°F | Wt 159.0 lb

## 2020-12-29 DIAGNOSIS — I5022 Chronic systolic (congestive) heart failure: Secondary | ICD-10-CM

## 2020-12-29 DIAGNOSIS — K732 Chronic active hepatitis, not elsewhere classified: Secondary | ICD-10-CM

## 2020-12-29 DIAGNOSIS — I700 Atherosclerosis of aorta: Secondary | ICD-10-CM | POA: Diagnosis not present

## 2020-12-29 DIAGNOSIS — Z9049 Acquired absence of other specified parts of digestive tract: Secondary | ICD-10-CM | POA: Diagnosis not present

## 2020-12-29 DIAGNOSIS — J449 Chronic obstructive pulmonary disease, unspecified: Secondary | ICD-10-CM

## 2020-12-29 DIAGNOSIS — I200 Unstable angina: Secondary | ICD-10-CM

## 2020-12-29 DIAGNOSIS — E782 Mixed hyperlipidemia: Secondary | ICD-10-CM

## 2020-12-29 DIAGNOSIS — D692 Other nonthrombocytopenic purpura: Secondary | ICD-10-CM | POA: Diagnosis not present

## 2020-12-29 DIAGNOSIS — I10 Essential (primary) hypertension: Secondary | ICD-10-CM

## 2020-12-29 DIAGNOSIS — F17219 Nicotine dependence, cigarettes, with unspecified nicotine-induced disorders: Secondary | ICD-10-CM

## 2020-12-29 MED ORDER — SYMBICORT 160-4.5 MCG/ACT IN AERO: 2.0000 | INHALATION_SPRAY | Freq: Two times a day (BID) | RESPIRATORY_TRACT | 5 refills | Status: DC

## 2020-12-29 NOTE — Assessment & Plan Note (Signed)
Stable with BP at goal today.  Recommend he monitor BP at home three mornings a week and document for provider, educated him on this.  Continue diet and exercise focus, DASH diet.  Labs next visit.  Continue current medication regimen and collaboration with cardiology.

## 2020-12-29 NOTE — Assessment & Plan Note (Signed)
Ongoing.  Have highly recommended he schedule follow-up with GI to initiate treatment, discussed at length with him.  Continue collaboration with GI, Dr. Tobi Bastos.

## 2020-12-29 NOTE — Patient Instructions (Signed)
Call [facility] --- [phone]  Hepatitis C  Hepatitis C is a liver infection that is caused by a virus. Many people have no symptoms or only mild symptoms. Hepatitis C is contagious. This means that it can spread from person to person. Over time, the infection can lead to seriousliver problems. What are the causes? This condition is caused by a virus. It can spread through: Contact with body fluids from someone who has the virus. This includes: Blood. Semen or vaginal fluids. Childbirth. A woman can pass the virus to her baby during birth. Blood or organ donations that were done in the [place] before 1992. What increases the risk? Having contact with needles or syringes that have the virus on them. This may happen when you: Get acupuncture. This is a treatment that puts thin needles through your skin. Get a tattoo or body piercing. Inject drugs. Having sex with someone who has the virus. The virus can be passed through vaginal, oral, or anal sex. Getting treatment to clean your blood (kidney dialysis). Having HIV or AIDS. Having a job that puts you in contact with blood or body fluids. What are the signs or symptoms? You feel very tired. You are not hungry. You feel like you may vomit or you vomit. You have pain in your belly or fluid builds up in your belly. Your pee is dark yellow. Your skin or the white parts of your eyes look yellow (jaundice). Your skin is itchy. Your poop is light in color. You have joint pain. You bleed or bruise often. Often, there are no symptoms. How is this treated? Treatment may depend on your condition and whether you have liver damage. Treatment may include: Taking antiviral medicines and other medicines. Having follow-up treatments every 6-12 months for liver problems. Getting a new liver from a donor. This is called a liver transplant. Follow these instructions at home: Medicines Take over-the-counter and prescription medicines only  as told by your doctor. If you were given an antiviral medicine, take it as told by your doctor. Do not stop using the antiviral even if you start to feel better. Do not take any new medicines unless your doctor says that this is okay. This includes over-the-counter medicines and supplements. Activity Rest as needed. Do not have sex until your doctor says this is okay. Do not swim or use hot tubs if you have open sores or wounds. Return to your normal activities as told by your doctor. Ask your doctor what activities are safe for you. Ask your doctor when you may go back to school or work. Eating and drinking  Eat a balanced diet. Eat plenty of: Fruits and vegetables. Whole grains. Lean meats or non-meat proteins, such as beans or tofu. Drink enough fluid to keep your pee pale yellow. Do not drink alcohol.  General instructions Do not share toothbrushes, nail clippers, or razors. Wash your hands often with soap and water for at least 20 seconds. If you do not have soap and water, use hand sanitizer. Cover any cuts or open sores on your skin. Keep all follow-up visits. You may need follow-up visits every 6-12 months. How is this prevented? Wash your hands often with soap and water for at least 20 seconds. Do not share needles or syringes. Use a condom every time you have vaginal, oral, or anal sex. Latex condoms should be used, if possible. Do not handle blood or body fluids without gloves or other protection. Avoid getting tattoos or piercings  in shops that are not clean. Where to find more information Centers for Disease Control and Prevention: FootballExhibition.com.br World Health Organization: https://castaneda-walker.com/ Contact a doctor if: You have a fever or chills. You have belly pain. Your pee is dark. Your poop is a light color or tan. You have joint pain. Get help right away if: You feel more tired. You do not feel like eating. You cannot eat or drink without vomiting. Your skin or the whites  of your eyes turn yellow or turn more yellow than they were before. You bruise or bleed easily. Summary Hepatitis C is a liver infection that is caused by a virus. Over time, this can lead to serious liver problems. The virus is contagious. This means that it can spread from person to person through blood, semen, or vaginal fluids. Do not take any new medicines unless your doctor says that this is okay. This information is not intended to replace advice given to you by your health care provider. Make sure you discuss any questions you have with your healthcare provider. Document Revised: 05/19/2020 Document Reviewed: 05/19/2020 Elsevier Patient Education  2022 [employer].

## 2020-12-29 NOTE — Assessment & Plan Note (Signed)
Chronic, ongoing with recent NSTEMI.  He will continue statin.  Lipid panel next visit.  Adjust meds as needed.

## 2020-12-29 NOTE — Assessment & Plan Note (Signed)
Chronic, stable.  Spirometry FEV1/FVC 90% and FEV1 76% in August 2020.   Recommend continued cessation.  Continue current medication regimen, discussed possible change to Anoro vs Symbicort but it is too costly at this time, consider for future and consider CCM referral in future.  He refuses Lung CA Screening CT at this time due to current medical bills, recommended he obtain this.  Return in 4 months with repeat spirometry.

## 2020-12-29 NOTE — Assessment & Plan Note (Signed)
As evidenced by bruising upper extremities with ASA use.  Recommend gentle skin care and monitor for wounds, if present immediately notify provider. 

## 2020-12-29 NOTE — Assessment & Plan Note (Signed)
Noted on past imaging.  Recommend he continue daily statin and ASA for prevention + complete cessation of smoking. 

## 2020-12-29 NOTE — Assessment & Plan Note (Signed)
Ongoing with recent MI, at this time will continue collaboration with cardiology and medication regimen as prescribed by them.

## 2020-12-29 NOTE — Assessment & Plan Note (Signed)
Since STEMI 07/28/20.  At this time continue current medication regimen and collaboration with cardiology.  Euvolemic today.  Recommend: - Reminded to call for an overnight weight gain of >2 pounds or a weekly weight gain of >5 pounds - not adding salt to food and read food labels. Reviewed the importance of keeping daily sodium intake to 2000mg  daily.

## 2020-12-29 NOTE — Progress Notes (Signed)
BP 117/65   Pulse 64   Temp 98.3 F (36.8 C) (Oral)   Wt 159 lb (72.1 kg)   SpO2 95%   BMI 24.90 kg/m    Subjective:    Patient ID: Andrew Potts, male    DOB: 10-18-1950, 70 y.o.   MRN: [id number]  HPI: Andrew Potts is a 70 y.o. male  Chief Complaint  Patient presents with   Follow-up    Patient states he is doing better and states he is feeling better. Patient states on his right side he doesn't think the nerve endings are not quite right. Patient states he is doing fine on the left side are doing well.    GALL BLADDER CHECK Followed by Dr. Claudine Potts at this time and had cholecystectomy on 12/07/20.  Overall since surgery reports feeling a lot better.   Recurrent headaches: no Visual changes: no Palpitations: no Dyspnea: no Chest pain: no Lower extremity edema: no Dizzy/lightheaded: no  COPD Continues on Symbicort daily + Proair.  Has continued to cestan from smoking since his STEMI. FEV1 76% -- August 2020 + FEV1/FVC 90%. COPD status: stable Satisfied with current treatment?: yes Oxygen use: no Dyspnea frequency: minimal  Cough frequency: none Rescue inhaler frequency:  once a month Limitation of activity: no Productive cough: none Last Spirometry: August 2020 Pneumovax: Up to Date Influenza: Up to Date    HEPATITIS C Seen by GI on 06/24/2019.  No past drug use or tattoos.  Did have blood transfusion in 1973.  No excess alcohol use at home.  Plan is for liver elastography --treatment to be determined after this.   Duration since diagnosis: 2020 Hep C transmission: possibly via blood transfusion in 1973 Genotype: 1a Viral load:  340,000 international units/mL. Hepatology evaluation:yes Liver biopsy:no  Cirrhosis: no Antiviral therapy:not yet Hepatocellular carcinoma screening: no Esophageal varices screening/EGD: no Hepatitis A Vaccine: Not up to Date Hepatitis B Vaccine: unknown Pneumovax Vaccine: Up to Date    HYPERLIPIDEMIA & HF Saw Andrew Potts at the  HF clinic on 12/19/20 -- has to go for repeat echo upcoming.  Aortic atherosclerosis noted on imaging 09/20/20. Hyperlipidemia status: good compliance Satisfied with current treatment?  yes Side effects:  no Medication compliance: good compliance Past cholesterol meds: none Supplements: none Aspirin:  no The ASCVD Risk score Denman George DC Jr., et al., 2013) failed to calculate for the following reasons:   The patient has a prior MI or stroke diagnosis Chest pain:  no Coronary artery disease:  no Family history CAD:  no Family history early CAD:  no  Relevant past medical, surgical, family and social history reviewed and updated as indicated. Interim medical history since our last visit reviewed. Allergies and medications reviewed and updated.  Review of Systems  Constitutional:  Negative for activity change, diaphoresis, fatigue and fever.  Respiratory:  Negative for cough, chest tightness, shortness of breath and wheezing.   Cardiovascular:  Negative for chest pain, palpitations and leg swelling.  Gastrointestinal: Negative.   Neurological: Negative.   Psychiatric/Behavioral: Negative.     Per HPI unless specifically indicated above     Objective:    BP 117/65   Pulse 64   Temp 98.3 F (36.8 C) (Oral)   Wt 159 lb (72.1 kg)   SpO2 95%   BMI 24.90 kg/m   Wt Readings from Last 3 Encounters:  12/29/20 159 lb (72.1 kg)  12/19/20 157 lb 2 oz (71.3 kg)  12/15/20 156 lb 3.2 oz (70.9 kg)  Physical Exam Vitals and nursing note reviewed.  Constitutional:      General: He is awake. He is not in acute distress.    Appearance: He is well-developed and well-groomed. He is not ill-appearing.  HENT:     Head: Normocephalic and atraumatic.     Right Ear: Hearing normal. No drainage.     Left Ear: Hearing normal. No drainage.  Eyes:     General: Lids are normal.        Right eye: No discharge.        Left eye: No discharge.     Conjunctiva/sclera: Conjunctivae normal.     Pupils:  Pupils are equal, round, and reactive to light.  Neck:     Thyroid: No thyromegaly.     Vascular: No carotid bruit.     Trachea: Trachea normal.  Cardiovascular:     Rate and Rhythm: Normal rate and regular rhythm.     Heart sounds: Normal heart sounds, S1 normal and S2 normal. No murmur heard.   No gallop.  Pulmonary:     Effort: Pulmonary effort is normal.     Breath sounds: Normal breath sounds.  Abdominal:     General: Bowel sounds are normal. There is no distension.     Palpations: Abdomen is soft. There is no hepatomegaly.     Tenderness: There is no abdominal tenderness.     Comments:    Musculoskeletal:        General: Normal range of motion.     Cervical back: Normal range of motion and neck supple.     Right lower leg: No edema.     Left lower leg: No edema.  Skin:    General: Skin is warm and dry.     Capillary Refill: Capillary refill takes less than 2 seconds.     Comments: Scattered pale purple bruises noted to bilateral upper extremities.  Neurological:     Mental Status: He is alert and oriented to person, place, and time.     Deep Tendon Reflexes: Reflexes are normal and symmetric.  Psychiatric:        Attention and Perception: Attention normal.        Mood and Affect: Mood normal.        Speech: Speech normal.        Behavior: Behavior normal. Behavior is cooperative.        Thought Content: Thought content normal.    Results for orders placed or performed during the hospital encounter of 12/07/20  Surgical pathology  Result Value Ref Range   SURGICAL PATHOLOGY      SURGICAL PATHOLOGY CASE: [id number] PATIENT: Andrew Potts Surgical Pathology Report     Specimen Submitted: A. Gallbladder  Clinical History: Chronic cholecystitis      DIAGNOSIS: A. GALLBLADDER; CHOLECYSTECTOMY: - SUBACUTE ON CHRONIC CHOLECYSTITIS WITH CHOLELITHIASIS; STATUS POST PERCUTANEOUS CHOLECYSTOSTOMY. - REACTIVE CYSTIC DUCT LYMPH NODE. - NEGATIVE FOR  MALIGNANCY.  GROSS DESCRIPTION: A. Labeled: Gallbladder Received: Formalin Collection time: 12:07 PM on 12/07/2020 Placed into formalin time: 12:13 PM on 12/07/2020 Size of specimen: 8.4 x 2.7 x 2 cm Specimen integrity: Focally disrupted External surface: The serosa is tan-pink, smooth, and glistening with a roughened hepatic bed.  Within the hepatic bed there is a 1.1 x 0.7 cm defect.  At the site of the defect there is a 12.5 cm long by 0.4 cm in diameter followed portion of blue tubing with an attached portion of black suture. Wall thickness: Ranges from 0.1-0.6  cm Mucosa: The mucosa is re d to tan and velvety with scattered areas of flattening.  Cholesterolosis is absent. Cystic duct: The cystic duct is 1.7 x 0.5 x 0.2 cm.  The duct patent and grossly unremarkable.  There is an adjacent 0.7 x 0.4 x 0.4 cm lymph node candidate. Bile present: There is a scant amount of tan-green viscous bile. Stones present: There is a single yellow, firm, well-circumscribed stone, 1.4 x 1 x 1 cm. Other findings: The wall is focally thickened with edema and overlying adipose tissue.  Block summary: 1 - cystic duct resection margin, en face and inked blue, with bisected lymph node candidate, submitted entirely 2 - representative thickened wall and flattened mucosa  RB 12/07/2020  Final Diagnosis performed by Elijah Birk, MD.   Electronically signed 12/08/2020 10:11:57AM The electronic signature indicates that the named Attending Pathologist has evaluated the specimen Technical component performed at [facility], [address] [place], [place] [address] Lab: [phone] Dir:  Roiza Wiedel Schimke, MD, MMM  Professional component performed at [facility], [facility], [address] [place], [place], [place] [address] Lab: [phone] Dir: Georgiann Cocker. Oneita Kras, MD       Assessment & Plan:   Problem List Items Addressed This Visit       Cardiovascular and Mediastinum   Essential hypertension     Stable with BP at goal today.  Recommend he monitor BP at home three mornings a week and document for provider, educated him on this.  Continue diet and exercise focus, DASH diet.  Labs next visit.  Continue current medication regimen and collaboration with cardiology.       Unstable angina (HCC)    Ongoing with recent MI, at this time will continue collaboration with cardiology and medication regimen as prescribed by them.       Senile purpura (HCC)    As evidenced by bruising upper extremities with ASA use.  Recommend gentle skin care and monitor for wounds, if present immediately notify provider.       Aortic atherosclerosis (HCC)    Noted on past imaging.  Recommend he continue daily statin and ASA for prevention + complete cessation of smoking.       Chronic systolic heart failure (HCC) - Primary    Since STEMI 07/28/20.  At this time continue current medication regimen and collaboration with cardiology.  Euvolemic today.  Recommend: - Reminded to call for an overnight weight gain of >2 pounds or a weekly weight gain of >5 pounds - not adding salt to food and read food labels. Reviewed the importance of keeping daily sodium intake to 2000mg  daily.         Respiratory   COPD (chronic obstructive pulmonary disease) (HCC)    Chronic, stable.  Spirometry FEV1/FVC 90% and FEV1 76% in August 2020.   Recommend continued cessation.  Continue current medication regimen, discussed possible change to Anoro vs Symbicort but it is too costly at this time, consider for future and consider CCM referral in future.  He refuses Lung CA Screening CT at this time due to current medical bills, recommended he obtain this.  Return in 4 months with repeat spirometry.       Relevant Medications   SYMBICORT 160-4.5 MCG/ACT inhaler     Digestive   Chronic active hepatitis (HCC)    Ongoing.  Have highly recommended he schedule follow-up with GI to initiate treatment, discussed at length with him.   Continue collaboration with GI, Dr. Tobi Bastos.  Nervous and Auditory   Nicotine dependence, cigarettes, w unsp disorders    Currently continues not to smoke, recommend continue cessation post MI.          Other   HLD (hyperlipidemia)    Chronic, ongoing with recent NSTEMI.  He will continue statin.  Lipid panel next visit.  Adjust meds as needed.       S/P laparoscopic cholecystectomy    Performed on 12/07/20, overall has recovered well with no further issues.         Follow up plan: Return in about 4 months (around 05/08/2021) for Annual physical --  spirometry needed.

## 2020-12-29 NOTE — Assessment & Plan Note (Signed)
Currently continues not to smoke, recommend continue cessation post MI.  

## 2020-12-29 NOTE — Assessment & Plan Note (Signed)
Performed on 12/07/20, overall has recovered well with no further issues.

## 2021-01-05 ENCOUNTER — Ambulatory Visit: Payer: Medicare Other | Admitting: Surgery

## 2021-01-09 ENCOUNTER — Ambulatory Visit (INDEPENDENT_AMBULATORY_CARE_PROVIDER_SITE_OTHER): Payer: Medicare Other

## 2021-01-09 VITALS — Ht 67.0 in | Wt 155.0 lb

## 2021-01-09 DIAGNOSIS — Z0000 Encounter for general adult medical examination without abnormal findings: Secondary | ICD-10-CM | POA: Diagnosis not present

## 2021-01-09 NOTE — Patient Instructions (Signed)
Andrew Potts , Thank you for taking time to come for your Medicare Wellness Visit. I appreciate your ongoing commitment to your health goals. Please review the following plan we discussed and let me know if I can assist you in the future.   Screening recommendations/referrals: Colonoscopy: due Recommended yearly ophthalmology/optometry visit for glaucoma screening and checkup Recommended yearly dental visit for hygiene and checkup  Vaccinations: Influenza vaccine: completed 04/07/2020, due 02/13/2021 Pneumococcal vaccine: due Tdap vaccine: due Shingles vaccine: discussed   Covid-19:  11/08/2020, 05/01/2020, 11/04/2019, 10/07/2019  Advanced directives: Advance directive discussed with you today.   Conditions/risks identified: none  Next appointment: Follow up in one year for your annual wellness visit.   Preventive Care 20 Years and Older, Male Preventive care refers to lifestyle choices and visits with your health care provider that can promote health and wellness. What does preventive care include? A yearly physical exam. This is also called an annual well check. Dental exams once or twice a year. Routine eye exams. Ask your health care provider how often you should have your eyes checked. Personal lifestyle choices, including: Daily care of your teeth and gums. Regular physical activity. Eating a healthy diet. Avoiding tobacco and drug use. Limiting alcohol use. Practicing safe sex. Taking low doses of aspirin every day. Taking vitamin and mineral supplements as recommended by your health care provider. What happens during an annual well check? The services and screenings done by your health care provider during your annual well check will depend on your age, overall health, lifestyle risk factors, and family history of disease. Counseling  Your health care provider may ask you questions about your: Alcohol use. Tobacco use. Drug use. Emotional well-being. Home and relationship  well-being. Sexual activity. Eating habits. History of falls. Memory and ability to understand (cognition). Work and work Astronomer. Screening  You may have the following tests or measurements: Height, weight, and BMI. Blood pressure. Lipid and cholesterol levels. These may be checked every 5 years, or more frequently if you are over 62 years old. Skin check. Lung cancer screening. You may have this screening every year starting at age 47 if you have a 30-pack-year history of smoking and currently smoke or have quit within the past 15 years. Fecal occult blood test (FOBT) of the stool. You may have this test every year starting at age 64. Flexible sigmoidoscopy or colonoscopy. You may have a sigmoidoscopy every 5 years or a colonoscopy every 10 years starting at age 25. Prostate cancer screening. Recommendations will vary depending on your family history and other risks. Hepatitis C blood test. Hepatitis B blood test. Sexually transmitted disease (STD) testing. Diabetes screening. This is done by checking your blood sugar (glucose) after you have not eaten for a while (fasting). You may have this done every 1-3 years. Abdominal aortic aneurysm (AAA) screening. You may need this if you are a current or former smoker. Osteoporosis. You may be screened starting at age 35 if you are at high risk. Talk with your health care provider about your test results, treatment options, and if necessary, the need for more tests. Vaccines  Your health care provider may recommend certain vaccines, such as: Influenza vaccine. This is recommended every year. Tetanus, diphtheria, and acellular pertussis (Tdap, Td) vaccine. You may need a Td booster every 10 years. Zoster vaccine. You may need this after age 51. Pneumococcal 13-valent conjugate (PCV13) vaccine. One dose is recommended after age 73. Pneumococcal polysaccharide (PPSV23) vaccine. One dose is recommended after age  59. Talk to your health care  provider about which screenings and vaccines you need and how often you need them. This information is not intended to replace advice given to you by your health care provider. Make sure you discuss any questions you have with your health care provider. Document Released: 07/29/2015 Document Revised: 03/21/2016 Document Reviewed: 05/03/2015 Elsevier Interactive Patient Education  2017 [place] Prevention in the Home Falls can cause injuries. They can happen to people of all ages. There are many things you can do to make your home safe and to help prevent falls. What can I do on the outside of my home? Regularly fix the edges of walkways and driveways and fix any cracks. Remove anything that might make you trip as you walk through a door, such as a raised step or threshold. Trim any bushes or trees on the path to your home. Use bright outdoor lighting. Clear any walking paths of anything that might make someone trip, such as rocks or tools. Regularly check to see if handrails are loose or broken. Make sure that both sides of any steps have handrails. Any raised decks and porches should have guardrails on the edges. Have any leaves, snow, or ice cleared regularly. Use sand or salt on walking paths during winter. Clean up any spills in your garage right away. This includes oil or grease spills. What can I do in the bathroom? Use night lights. Install grab bars by the toilet and in the tub and shower. Do not use towel bars as grab bars. Use non-skid mats or decals in the tub or shower. If you need to sit down in the shower, use a plastic, non-slip stool. Keep the floor dry. Clean up any water that spills on the floor as soon as it happens. Remove soap buildup in the tub or shower regularly. Attach bath mats securely with double-sided non-slip rug tape. Do not have throw rugs and other things on the floor that can make you trip. What can I do in the bedroom? Use night lights. Make  sure that you have a light by your bed that is easy to reach. Do not use any sheets or blankets that are too big for your bed. They should not hang down onto the floor. Have a firm chair that has side arms. You can use this for support while you get dressed. Do not have throw rugs and other things on the floor that can make you trip. What can I do in the kitchen? Clean up any spills right away. Avoid walking on wet floors. Keep items that you use a lot in easy-to-reach places. If you need to reach something above you, use a strong step stool that has a grab bar. Keep electrical cords out of the way. Do not use floor polish or wax that makes floors slippery. If you must use wax, use non-skid floor wax. Do not have throw rugs and other things on the floor that can make you trip. What can I do with my stairs? Do not leave any items on the stairs. Make sure that there are handrails on both sides of the stairs and use them. Fix handrails that are broken or loose. Make sure that handrails are as long as the stairways. Check any carpeting to make sure that it is firmly attached to the stairs. Fix any carpet that is loose or worn. Avoid having throw rugs at the top or bottom of the stairs. If you do have  throw rugs, attach them to the floor with carpet tape. Make sure that you have a light switch at the top of the stairs and the bottom of the stairs. If you do not have them, ask someone to add them for you. What else can I do to help prevent falls? Wear shoes that: Do not have high heels. Have rubber bottoms. Are comfortable and fit you well. Are closed at the toe. Do not wear sandals. If you use a stepladder: Make sure that it is fully opened. Do not climb a closed stepladder. Make sure that both sides of the stepladder are locked into place. Ask someone to hold it for you, if possible. Clearly mark and make sure that you can see: Any grab bars or handrails. First and last steps. Where the  edge of each step is. Use tools that help you move around (mobility aids) if they are needed. These include: Canes. Walkers. Scooters. Crutches. Turn on the lights when you go into a dark area. Replace any light bulbs as soon as they burn out. Set up your furniture so you have a clear path. Avoid moving your furniture around. If any of your floors are uneven, fix them. If there are any pets around you, be aware of where they are. Review your medicines with your doctor. Some medicines can make you feel dizzy. This can increase your chance of falling. Ask your doctor what other things that you can do to help prevent falls. This information is not intended to replace advice given to you by your health care provider. Make sure you discuss any questions you have with your health care provider. Document Released: 04/28/2009 Document Revised: 12/08/2015 Document Reviewed: 08/06/2014 Elsevier Interactive Patient Education  2017 [employer].

## 2021-01-09 NOTE — Progress Notes (Signed)
I connected with Andrew Potts today by telephone and verified that I am speaking with the correct person using two identifiers. Location patient: home Location provider: work Persons participating in the virtual visit: Roberto ScalesJohnny Prospero, Deisy Ozbun LPN.   I discussed the limitations, risks, security and privacy concerns of performing an evaluation and management service by telephone and the availability of in person appointments. I also discussed with the patient that there may be a patient responsible charge related to this service. The patient expressed understanding and verbally consented to this telephonic visit.    Interactive audio and video telecommunications were attempted between this provider and patient, however failed, due to patient having technical difficulties OR patient did not have access to video capability.  We continued and completed visit with audio only.     Vital signs may be patient reported or missing.  Subjective:   Andrew BaliJohnny L Potts is a 70 y.o. male who presents for Medicare Annual/Subsequent preventive examination.  Review of Systems     Cardiac Risk Factors include: advanced age (>5355men, 22>65 women);dyslipidemia;hypertension;male gender     Objective:    Today's Vitals   01/09/21 0942  Weight: 155 lb (70.3 kg)  Height: 5\' 7"  (1.702 m)   Body mass index is 24.28 kg/m.  Advanced Directives 01/09/2021 12/07/2020 12/02/2020 11/04/2020 10/21/2020 09/20/2020 08/18/2020  Does Patient Have a Medical Advance Directive? No No No No No No Yes  Type of Advance Directive - - - - - - MidwifeHealthcare Power of Attorney;Living will  Does patient want to make changes to medical advance directive? - - - - - - Yes (MAU/Ambulatory/Procedural Areas - Information given)  Would patient like information on creating a medical advance directive? - No - Patient declined - - No - Patient declined No - Patient declined Yes (MAU/Ambulatory/Procedural Areas - Information given)    Current  Medications (verified) Outpatient Encounter Medications as of 01/09/2021  Medication Sig   aspirin EC 81 MG EC tablet Take 1 tablet (81 mg total) by mouth daily. Swallow whole.   atorvastatin (LIPITOR) 80 MG tablet Take 1 tablet (80 mg total) by mouth daily.   Ensure (ENSURE) Take 237 mLs by mouth daily.   isosorbide mononitrate (IMDUR) 30 MG 24 hr tablet Take 1 tablet (30 mg total) by mouth daily.   lisinopril (ZESTRIL) 5 MG tablet Take 1 tablet (5 mg total) by mouth daily.   metoprolol tartrate (LOPRESSOR) 25 MG tablet Take 0.5 tablets (12.5 mg total) by mouth 2 (two) times daily.   nitroGLYCERIN (NITROSTAT) 0.4 MG SL tablet Place 1 tablet (0.4 mg total) under the tongue every 5 (five) minutes as needed for chest pain.   pantoprazole (PROTONIX) 40 MG tablet Take 1 tablet (40 mg total) by mouth daily.   prasugrel (EFFIENT) 10 MG TABS tablet Take 10 mg by mouth daily.   PROAIR HFA 108 (90 Base) MCG/ACT inhaler Inhale 2 puffs into the lungs every 4 (four) hours as needed.   sildenafil (REVATIO) 20 MG tablet Take 20 mg by mouth daily as needed (erectile dysfunction).   SYMBICORT 160-4.5 MCG/ACT inhaler Inhale 2 puffs into the lungs 2 (two) times daily.   No facility-administered encounter medications on file as of 01/09/2021.    Allergies (verified) Levaquin [levofloxacin], Codeine, and Codone [hydrocodone]   History: Past Medical History:  Diagnosis Date   Anginal pain (HCC)    Aortic atherosclerosis (HCC)    "extensive" per CT imaging on 09/20/2020   Arthritis    Cardiac arrest with  successful resuscitation (HCC) 07/28/2020   V.fib arrest in the setting of acute STEMI   CHF (congestive heart failure) (HCC)    COPD (chronic obstructive pulmonary disease) (HCC)    Coronary artery disease    Diverticulosis of colon 09/20/2020   multifocal   Duodenal diverticulum 09/20/2020   Erectile dysfunction    takes PDE5i (sildenafil) medication   GERD (gastroesophageal reflux disease)    HCV  (hepatitis C virus)    HLD (hyperlipidemia)    Hypertension    Myocardial infarction (HCC) 07/28/2020   STEMI --> PCI for high grade RCA disease   Pneumonia    Status post insertion of drug eluting coronary artery stent 07/28/2020   DES x 1 to mRCA with overlapping stents to the dRCA   Past Surgical History:  Procedure Laterality Date   ABDOMINAL EXPLORATION SURGERY     CHOLECYSTECTOMY  12/07/2020   COLONOSCOPY     CORONARY/GRAFT ACUTE MI REVASCULARIZATION N/A 07/28/2020   Procedure: CORONARY/GRAFT ACUTE MI REVASCULARIZATION;  Surgeon: Alwyn Pea, MD;  Location: [facility] INVASIVE CV LAB;  Service: Cardiovascular;  Laterality: N/A;   LEFT HEART CATH AND CORONARY ANGIOGRAPHY N/A 07/28/2020   Procedure: LEFT HEART CATH AND CORONARY ANGIOGRAPHY;  Surgeon: Alwyn Pea, MD;  Location: [facility] INVASIVE CV LAB;  Service: Cardiovascular;  Laterality: N/A;   NASAL SINUS SURGERY     Family History  Problem Relation Age of Onset   Alcohol abuse Father    Alcohol abuse Brother    Aneurysm Brother    Social History   Socioeconomic History   Marital status: Divorced    Spouse name: Not on file   Number of children: Not on file   Years of education: Not on file   Highest education level: Not on file  Occupational History   Occupation: unload trucks    Comment: [employer]  Tobacco Use   Smoking status: Former    Packs/day: 0.50    Years: 47.00    Pack years: 23.50    Types: Cigarettes    Quit date: 07/28/2020    Years since quitting: 0.4   Smokeless tobacco: Never   Tobacco comments:    08/24/20 given quit smoking paperwork and encouraged continued cessation  Vaping Use   Vaping Use: Never used  Substance and Sexual Activity   Alcohol use: Not Currently   Drug use: Never   Sexual activity: Yes  Other Topics Concern   Not on file  Social History Narrative   Live alone   Social Determinants of Health   Financial Resource Strain: Low Risk    Difficulty of Paying Living  Expenses: Not hard at all  Food Insecurity: No Food Insecurity   Worried About Programme researcher, broadcasting/film/video in the Last Year: Never true   Ran Out of Food in the Last Year: Never true  Transportation Needs: No Transportation Needs   Lack of Transportation (Medical): No   Lack of Transportation (Non-Medical): No  Physical Activity: Inactive   Days of Exercise per Week: 0 days   Minutes of Exercise per Session: 0 min  Stress: No Stress Concern Present   Feeling of Stress : Not at all  Social Connections: Not on file    Tobacco Counseling Counseling given: Not Answered Tobacco comments: 08/24/20 given quit smoking paperwork and encouraged continued cessation   Clinical Intake:  Pre-visit preparation completed: Yes  Pain : No/denies pain     Nutritional Status: BMI of 19-24  Normal Nutritional Risks: None Diabetes: No  How often do you need to have someone help you when you read instructions, pamphlets, or other written materials from your doctor or pharmacy?: 1 - Never What is the last grade level you completed in school?: 12th grade  Diabetic? no  Interpreter Needed?: No  Information entered by :: NAllen LPN   Activities of Daily Living In your present state of health, do you have any difficulty performing the following activities: 01/09/2021 12/19/2020  Hearing? N N  Vision? N N  Difficulty concentrating or making decisions? N N  Walking or climbing stairs? N N  Dressing or bathing? N N  Doing errands, shopping? N N  Preparing Food and eating ? N -  Using the Toilet? N -  In the past six months, have you accidently leaked urine? N -  Do you have problems with loss of bowel control? N -  Managing your Medications? N -  Managing your Finances? N -  Housekeeping or managing your Housekeeping? N -  Some recent data might be hidden    Patient Care Team: [name], NP as PCP - General (Nurse Practitioner)  Indicate any recent Medical Services you may have received  from other than [facility] providers in the past year (date may be approximate).     Assessment:   This is a routine wellness examination for Andrew Potts.  Hearing/Vision screen Vision Screening - Comments:: No regular eye exams, [facility]  Dietary issues and exercise activities discussed: Current Exercise Habits: The patient does not participate in regular exercise at present   Goals Addressed             This Visit's Progress    Patient Stated       01/09/2021, wants to lose belly fat        Depression Screen PHQ 2/9 Scores 01/09/2021 08/24/2020 08/10/2020 10/22/2019 03/10/2019 02/04/2019  PHQ - 2 Score 0 0 0 0 0 0  PHQ- 9 Score - 1 0 - 0 0    Fall Risk Fall Risk  01/09/2021 12/19/2020 08/18/2020 08/16/2020 08/10/2020  Falls in the past year? 0 0 0 0 1  Number falls in past yr: - 0 0 0 0  Injury with Fall? - 0 0 0 0  Risk for fall due to : Medication side effect - No Fall Risks - -  Follow up Falls evaluation completed;Education provided;Falls prevention discussed Falls evaluation completed Falls evaluation completed;Education provided;Falls prevention discussed Falls evaluation completed -    FALL RISK PREVENTION PERTAINING TO THE HOME:  Any stairs in or around the home? Yes  If so, are there any without handrails? No  Home free of loose throw rugs in walkways, pet beds, electrical cords, etc? Yes  Adequate lighting in your home to reduce risk of falls? Yes   ASSISTIVE DEVICES UTILIZED TO PREVENT FALLS:  Life alert? No  Use of a cane, walker or w/c? No  Grab bars in the bathroom? No  Shower chair or bench in shower? No  Elevated toilet seat or a handicapped toilet? No   TIMED UP AND GO:  Was the test performed? No .      Cognitive Function:     6CIT Screen 01/09/2021 03/10/2019  What Year? 0 points 0 points  What month? 0 points 0 points  What time? 0 points 0 points  Count back from 20 0 points 0 points  Months in reverse 0 points 0 points  Repeat phrase 0 points 0 points   Total Score 0 0  Immunizations Immunization History  Administered Date(s) Administered   Influenza, High Dose Seasonal PF 03/21/2019, 04/07/2020   Influenza,inj,Quad PF,6+ Mos 04/07/2018   PFIZER(Purple Top)SARS-COV-2 Vaccination 10/07/2019, 11/04/2019, 05/01/2020   Pneumococcal Conjugate-13 03/21/2019    TDAP status: Due, Education has been provided regarding the importance of this vaccine. Advised may receive this vaccine at local pharmacy or Health Dept. Aware to provide a copy of the vaccination record if obtained from local pharmacy or Health Dept. Verbalized acceptance and understanding.  Flu Vaccine status: Up to date  Pneumococcal vaccine status: Declined,  Education has been provided regarding the importance of this vaccine but patient still declined. Advised may receive this vaccine at local pharmacy or Health Dept. Aware to provide a copy of the vaccination record if obtained from local pharmacy or Health Dept. Verbalized acceptance and understanding.   Covid-19 vaccine status: Completed vaccines  Qualifies for Shingles Vaccine? Yes   Zostavax completed No   Shingrix Completed?: No.    Education has been provided regarding the importance of this vaccine. Patient has been advised to call insurance company to determine out of pocket expense if they have not yet received this vaccine. Advised may also receive vaccine at local pharmacy or Health Dept. Verbalized acceptance and understanding.  Screening Tests Health Maintenance  Topic Date Due   Zoster Vaccines- Shingrix (1 of 2) Never done   COVID-19 Vaccine (4 - Booster for Pfizer series) 09/01/2020   COLONOSCOPY (Pts 45-86yrs Insurance coverage will need to be confirmed)  08/10/2021 (Originally 06/26/1998)   TETANUS/TDAP  08/10/2021 (Originally 06/26/1972)   PNA vac Low Risk Adult (2 of 2 - PPSV23) 08/10/2021 (Originally 03/20/2020)   INFLUENZA VACCINE  02/13/2021   Hepatitis C Screening  Completed   HPV VACCINES  Aged  Out    Health Maintenance  Health Maintenance Due  Topic Date Due   Zoster Vaccines- Shingrix (1 of 2) Never done   COVID-19 Vaccine (4 - Booster for Pfizer series) 09/01/2020    Colorectal cancer screening: decline  Lung Cancer Screening: (Low Dose CT Chest recommended if Age 36-80 years, 30 pack-year currently smoking OR have quit w/in 15years.) does not qualify.   Lung Cancer Screening Referral: no  Additional Screening:  Hepatitis C Screening: does qualify; Completed 06/24/2019   Vision Screening: Recommended annual ophthalmology exams for early detection of glaucoma and other disorders of the eye. Is the patient up to date with their annual eye exam?  No  Who is the provider or what is the name of the office in which the patient attends annual eye exams? [facility] If pt is not established with a provider, would they like to be referred to a provider to establish care? No .   Dental Screening: Recommended annual dental exams for proper oral hygiene  Community Resource Referral / Chronic Care Management: CRR required this visit?  No   CCM required this visit?  No      Plan:     I have personally reviewed and noted the following in the patient's chart:   Medical and social history Use of alcohol, tobacco or illicit drugs  Current medications and supplements including opioid prescriptions. Patient is not currently taking opioid prescriptions. Functional ability and status Nutritional status Physical activity Advanced directives List of other physicians Hospitalizations, surgeries, and ER visits in previous 12 months Vitals Screenings to include cognitive, depression, and falls Referrals and appointments  In addition, I have reviewed and discussed with patient certain preventive protocols, quality metrics, and best practice  recommendations. A written personalized care plan for preventive services as well as general preventive health recommendations were provided to  patient.     Barb Merino, LPN   11/09/621   Nurse Notes:

## 2021-01-24 ENCOUNTER — Other Ambulatory Visit: Payer: Self-pay

## 2021-01-24 ENCOUNTER — Ambulatory Visit
Admission: RE | Admit: 2021-01-24 | Discharge: 2021-01-24 | Disposition: A | Discharge status: Home / Self Care | Payer: Medicare Other | Source: Ambulatory Visit | Attending: Family | Admitting: Family

## 2021-01-24 DIAGNOSIS — R0609 Other forms of dyspnea: Secondary | ICD-10-CM | POA: Diagnosis not present

## 2021-01-24 DIAGNOSIS — I252 Old myocardial infarction: Secondary | ICD-10-CM | POA: Insufficient documentation

## 2021-01-24 DIAGNOSIS — I110 Hypertensive heart disease with heart failure: Secondary | ICD-10-CM | POA: Diagnosis not present

## 2021-01-24 DIAGNOSIS — J449 Chronic obstructive pulmonary disease, unspecified: Secondary | ICD-10-CM | POA: Diagnosis not present

## 2021-01-24 DIAGNOSIS — I5022 Chronic systolic (congestive) heart failure: Secondary | ICD-10-CM | POA: Insufficient documentation

## 2021-01-24 DIAGNOSIS — I340 Nonrheumatic mitral (valve) insufficiency: Secondary | ICD-10-CM | POA: Diagnosis not present

## 2021-01-24 LAB — ECHOCARDIOGRAM COMPLETE
AR max vel: 2.11 cm2
AV Area VTI: 2.22 cm2
AV Area mean vel: 1.98 cm2
AV Mean grad: 3.5 mmHg
AV Peak grad: 6.3 mmHg
Ao pk vel: 1.26 m/s
Area-P 1/2: 4.41 cm2
Calc EF: 37 %
S' Lateral: 3.64 cm
Single Plane A2C EF: 29.5 %
Single Plane A4C EF: 55.2 %

## 2021-01-24 NOTE — Progress Notes (Signed)
*  PRELIMINARY RESULTS* Echocardiogram 2D Echocardiogram has been performed.  Cristela Blue 01/24/2021, 10:23 AM

## 2021-02-06 NOTE — Progress Notes (Signed)
Patient ID: Andrew Potts, male    DOB: 1951-04-16, 70 y.o.   MRN: [id number]  HPI  Andrew Potts is a 70 y/o male with a history of COPD, CAD (STEMI), arthritis, recent tobacco use and chronic heart failure.   Echo report from 01/24/21 reviewed and showed an EF of 55-60% along with mild Andrew and no LVH.   LHC done 07/28/20 showed: Prox RCA to Mid RCA lesion is 100% stenosed. Prox Cx lesion is 75% stenosed. Mid Cx lesion is 90% stenosed. 2nd [name] is 90% stenosed. Mid Cx to Dist Cx lesion is 90% stenosed. 1st Diag lesion is 50% stenosed. Prox LAD to Mid LAD lesion is 40% stenosed. Successful PCI and stent with DES mid to distal RCA   Conclusion Multivessel coronary artery disease on diagnostic cardiac cath during STEMI Mid RCA infarct-related vessel Circumflex was large but had complex Trifurcation disease LAD was large relatively free of disease Left main large relatively free of disease LV had mild reduced left ventricular function with inferior hypo-EF around 40 to 45% Intervention Successful PCI and stent of mid RCA with overlapping DES stents to the distal RCA TIMI-3 flow was restored from TIMI 0 Placed on aspirin and Brilinta Angiomax for an additional 4 hours  Same day surgery 12/07/20. Was in the ED 11/04/20 due to foul smell from his cholecystostomy drainage collection bag. Drainage bag changed and he was released. Admitted 07/28/20 due to cardiac arrest with subsequent CPR and 3 shocks delivered due to being in VF. Cardiology consult obtained. EKG showed STEMI. Negative head CT. Emergent cath with stent placement completed. Discharged after 2 days.   He presents today for a follow-up visit with a chief complaint of minimal shortness of breath upon moderate exertion. He describes this as having been present for several months. He has associated fatigue, cough and gradual weight gain along with this. He denies any difficulty sleeping, dizziness, abdominal distention, pedal  edema, chest pain or palpitations.   Continues to not smoke since January of this year. Says that he doesn't think he's eating more because of not smoking but does drink 1 can of ensure daily and hasn't been as active since he hasn't been working.   Past Medical History:  Diagnosis Date   Anginal pain (HCC)    Aortic atherosclerosis (HCC)    "extensive" per CT imaging on 09/20/2020   Arthritis    Cardiac arrest with successful resuscitation (HCC) 07/28/2020   V.fib arrest in the setting of acute STEMI   CHF (congestive heart failure) (HCC)    COPD (chronic obstructive pulmonary disease) (HCC)    Coronary artery disease    Diverticulosis of colon 09/20/2020   multifocal   Duodenal diverticulum 09/20/2020   Erectile dysfunction    takes PDE5i (sildenafil) medication   GERD (gastroesophageal reflux disease)    HCV (hepatitis C virus)    HLD (hyperlipidemia)    Hypertension    Myocardial infarction (HCC) 07/28/2020   STEMI --> PCI for high grade RCA disease   Pneumonia    Status post insertion of drug eluting coronary artery stent 07/28/2020   DES x 1 to mRCA with overlapping stents to the dRCA   Past Surgical History:  Procedure Laterality Date   ABDOMINAL EXPLORATION SURGERY     CHOLECYSTECTOMY  12/07/2020   COLONOSCOPY     CORONARY/GRAFT ACUTE MI REVASCULARIZATION N/A 07/28/2020   Procedure: CORONARY/GRAFT ACUTE MI REVASCULARIZATION;  Surgeon: Alwyn Pea, MD;  Location: [facility] INVASIVE CV LAB;  Service: Cardiovascular;  Laterality: N/A;   LEFT HEART CATH AND CORONARY ANGIOGRAPHY N/A 07/28/2020   Procedure: LEFT HEART CATH AND CORONARY ANGIOGRAPHY;  Surgeon: Alwyn Pea, MD;  Location: [facility] INVASIVE CV LAB;  Service: Cardiovascular;  Laterality: N/A;   NASAL SINUS SURGERY     Family History  Problem Relation Age of Onset   Alcohol abuse Father    Alcohol abuse Brother    Aneurysm Brother    Social History   Tobacco Use   Smoking status: Former     Packs/day: 0.50    Years: 47.00    Pack years: 23.50    Types: Cigarettes    Quit date: 07/28/2020    Years since quitting: 0.5   Smokeless tobacco: Never   Tobacco comments:    08/24/20 given quit smoking paperwork and encouraged continued cessation  Substance Use Topics   Alcohol use: Not Currently   Allergies  Allergen Reactions   Levaquin [Levofloxacin] Swelling   Codeine Itching    Abdominal pain, nose itching   Codone [Hydrocodone] Other (See Comments)    Abdominal pain, felt loopy   Prior to Admission medications   Medication Sig Start Date End Date Taking? Authorizing Provider  aspirin EC 81 MG EC tablet Take 1 tablet (81 mg total) by mouth daily. Swallow whole. 07/31/20  Yes Cipriano Bunker, MD  atorvastatin (LIPITOR) 80 MG tablet Take 1 tablet (80 mg total) by mouth daily. 07/31/20  Yes Cipriano Bunker, MD  Ensure (ENSURE) Take 237 mLs by mouth daily.   Yes [provider]  isosorbide mononitrate (IMDUR) 30 MG 24 hr tablet Take 1 tablet (30 mg total) by mouth daily. 09/23/20  Yes Arnetha Courser, MD  lisinopril (ZESTRIL) 5 MG tablet Take 1 tablet (5 mg total) by mouth daily. 10/06/20  Yes [name], Jolene T, NP  metoprolol tartrate (LOPRESSOR) 25 MG tablet Take 0.5 tablets (12.5 mg total) by mouth 2 (two) times daily. 07/30/20  Yes Cipriano Bunker, MD  nitroGLYCERIN (NITROSTAT) 0.4 MG SL tablet Place 1 tablet (0.4 mg total) under the tongue every 5 (five) minutes as needed for chest pain. 07/30/20  Yes Cipriano Bunker, MD  pantoprazole (PROTONIX) 40 MG tablet Take 1 tablet (40 mg total) by mouth daily. 12/22/20  Yes [name], MD  prasugrel (EFFIENT) 10 MG TABS tablet Take 10 mg by mouth daily. 09/05/20  Yes [provider]  PROAIR HFA 108 (90 Base) MCG/ACT inhaler Inhale 2 puffs into the lungs every 4 (four) hours as needed. 06/24/20  Yes [name], Jolene T, NP  sildenafil (REVATIO) 20 MG tablet Take 20 mg by mouth daily as needed (erectile dysfunction).   Yes  [provider]  SYMBICORT 160-4.5 MCG/ACT inhaler Inhale 2 puffs into the lungs 2 (two) times daily. 12/29/20  Yes [name], NP    Review of Systems  Constitutional:  Positive for fatigue. Negative for appetite change.  HENT:  Negative for congestion, postnasal drip and sore throat.   Eyes: Negative.   Respiratory:  Positive for cough and shortness of breath ("very little").   Cardiovascular:  Negative for chest pain, palpitations and leg swelling.  Gastrointestinal:  Negative for abdominal distention and abdominal pain.  Endocrine: Negative.   Genitourinary: Negative.   Musculoskeletal:  Negative for back pain and neck pain.  Skin: Negative.   Allergic/Immunologic: Negative.   Neurological:  Negative for dizziness and light-headedness.  Hematological:  Negative for adenopathy. Does not bruise/bleed easily.  Psychiatric/Behavioral:  Negative for dysphoric mood and  sleep disturbance (sleeping on 1 pillow). The patient is not nervous/anxious.    Vitals:   02/07/21 0853  BP: 120/66  Pulse: 62  Resp: 14  SpO2: 95%  Weight: 164 lb 8 oz (74.6 kg)  Height: 5\' 7"  (1.702 m)   Wt Readings from Last 3 Encounters:  02/07/21 164 lb 8 oz (74.6 kg)  01/09/21 155 lb (70.3 kg)  12/29/20 159 lb (72.1 kg)   Lab Results  Component Value Date   CREATININE 0.77 09/28/2020   CREATININE 0.62 09/21/2020   CREATININE 0.70 09/20/2020    Physical Exam Vitals and nursing note reviewed.  Constitutional:      Appearance: Normal appearance.  HENT:     Head: Normocephalic and atraumatic.  Cardiovascular:     Rate and Rhythm: Normal rate and regular rhythm.  Pulmonary:     Effort: Pulmonary effort is normal. No respiratory distress.     Breath sounds: No wheezing or rales.  Abdominal:     General: There is no distension.     Palpations: Abdomen is soft.     Tenderness: There is no abdominal tenderness.  Musculoskeletal:        General: No tenderness.     Cervical back:  Normal range of motion and neck supple.     Right lower leg: No edema.     Left lower leg: No edema.  Skin:    General: Skin is warm and dry.  Neurological:     General: No focal deficit present.     Mental Status: He is alert and oriented to person, place, and time.  Psychiatric:        Mood and Affect: Mood normal.        Behavior: Behavior normal.        Thought Content: Thought content normal.    Assessment & Plan:  1: Chronic heart failure with preserved ejection fraction without structural changes- - NYHA class II - euvolemic today - weighing daily; reminded to call for an overnight weight gain of > 2 pounds or a weekly weight gain of > 5 pounds - weight up 7 pounds from last visit here 6 weeks ago - not adding salt and has been reading food labels for sodium content; reviewed the importance of keeping daily sodium intake to ~ 2000mg / day; low sodium cookbook provided to patient today - if his appetite is doing well, probably doesn't need to drink the ensure - saw cardiology (Fath) 11/29/20; returns Nov 2022 - encouraged to increase his activity; note given that he could return to work with restrictions of not lifting > 10 pounds and only working 4 hours/ day; he says that he's not sure he's going to return but wanted the letter in case he decides to return - reviewed echo results with him - BNP 07/28/20 was 37.9  2: COPD- - saw PCP Dec 2022) 12/29/20 - BMP 09/28/20 reviewed and showed sodium 137, potassium 5.9 (repeat K was 4.0), creatinine 0.77 and GFR 98 - has not smoked since admission on 07/28/20 and he was congratulated on that  3: STEMI- - stent placement Jan 2022 - started at cardiac rehab but then had to stop due to drainage tube that was in; he says that since his surgery, he's been walking more on his own and lifting some 8 pound dumbbells at home; says that he thinks he's going to continue doing that instead of going back to cardiac rehab - is contemplating returning  to work at 07/30/20; note given  per above   Medication bottles reviewed.   Due to HF stability, will not make a return appointment for patient at this time. Advised patient that he could call back at anytime for any questions or to make another appointment and he was comfortable with this plan.

## 2021-02-07 ENCOUNTER — Ambulatory Visit: Payer: Medicare Other | Attending: Family | Admitting: Family

## 2021-02-07 ENCOUNTER — Encounter: Payer: Self-pay | Admitting: Family

## 2021-02-07 ENCOUNTER — Other Ambulatory Visit: Payer: Self-pay

## 2021-02-07 VITALS — BP 120/66 | HR 62 | Resp 14 | Ht 67.0 in | Wt 164.5 lb

## 2021-02-07 DIAGNOSIS — Z7951 Long term (current) use of inhaled steroids: Secondary | ICD-10-CM | POA: Diagnosis not present

## 2021-02-07 DIAGNOSIS — Z7982 Long term (current) use of aspirin: Secondary | ICD-10-CM | POA: Diagnosis not present

## 2021-02-07 DIAGNOSIS — I110 Hypertensive heart disease with heart failure: Secondary | ICD-10-CM | POA: Insufficient documentation

## 2021-02-07 DIAGNOSIS — Z87891 Personal history of nicotine dependence: Secondary | ICD-10-CM | POA: Diagnosis not present

## 2021-02-07 DIAGNOSIS — Z955 Presence of coronary angioplasty implant and graft: Secondary | ICD-10-CM | POA: Diagnosis not present

## 2021-02-07 DIAGNOSIS — I2510 Atherosclerotic heart disease of native coronary artery without angina pectoris: Secondary | ICD-10-CM | POA: Insufficient documentation

## 2021-02-07 DIAGNOSIS — Z8674 Personal history of sudden cardiac arrest: Secondary | ICD-10-CM | POA: Insufficient documentation

## 2021-02-07 DIAGNOSIS — Z881 Allergy status to other antibiotic agents status: Secondary | ICD-10-CM | POA: Diagnosis not present

## 2021-02-07 DIAGNOSIS — Z79899 Other long term (current) drug therapy: Secondary | ICD-10-CM | POA: Insufficient documentation

## 2021-02-07 DIAGNOSIS — Z885 Allergy status to narcotic agent status: Secondary | ICD-10-CM | POA: Diagnosis not present

## 2021-02-07 DIAGNOSIS — I5032 Chronic diastolic (congestive) heart failure: Secondary | ICD-10-CM | POA: Diagnosis not present

## 2021-02-07 DIAGNOSIS — I252 Old myocardial infarction: Secondary | ICD-10-CM | POA: Insufficient documentation

## 2021-02-07 DIAGNOSIS — I2111 ST elevation (STEMI) myocardial infarction involving right coronary artery: Secondary | ICD-10-CM

## 2021-02-07 DIAGNOSIS — J449 Chronic obstructive pulmonary disease, unspecified: Secondary | ICD-10-CM

## 2021-02-07 NOTE — Patient Instructions (Addendum)
Continue weighing daily and call for an overnight weight gain of > 2 pounds or a weekly weight gain of >5 pounds.    Call us in the future if you need us for anything  

## 2021-02-16 ENCOUNTER — Telehealth: Payer: Self-pay | Admitting: *Deleted

## 2021-02-16 NOTE — Telephone Encounter (Signed)
Pt need activity form to exercise completed by Dr. He said other Dr ok him to go back to work Placed in [employer] for review

## 2021-02-21 NOTE — Telephone Encounter (Signed)
Spoke with patient and notified him that he form was completed and faxed back over. Patient notified and was advised to reach out to company to see if they received the patient's paperwork. Patient verbalized understanding and has no further questions.

## 2021-03-24 ENCOUNTER — Other Ambulatory Visit: Payer: Self-pay | Admitting: Surgery

## 2021-03-24 ENCOUNTER — Telehealth: Payer: Self-pay | Admitting: Surgery

## 2021-03-24 MED ORDER — PANTOPRAZOLE SODIUM 40 MG PO TBEC: 40.0000 mg | DELAYED_RELEASE_TABLET | Freq: Every day | ORAL | 0 refills | Status: DC

## 2021-03-24 NOTE — Telephone Encounter (Signed)
Pt called requesting a refill on the following medication to be sent to [facility] in [place], please:   Pantoprazol 40 mg i QD #90  Andrew Potts states he & his pharmacy have requested this refill prior to running out w/o any response from the office.  He had a cholecystectomy by Dr. Claudine Mouton in May & feels this medication is still helping, so he assumes it is still necessary to take daily.  Please advise by calling him back.  Thank you

## 2021-05-04 ENCOUNTER — Encounter: Payer: Medicare Other | Admitting: Nurse Practitioner

## 2021-05-10 ENCOUNTER — Ambulatory Visit (INDEPENDENT_AMBULATORY_CARE_PROVIDER_SITE_OTHER): Payer: Medicare Other | Admitting: Nurse Practitioner

## 2021-05-10 ENCOUNTER — Other Ambulatory Visit: Payer: Self-pay

## 2021-05-10 ENCOUNTER — Encounter: Payer: Self-pay | Admitting: Nurse Practitioner

## 2021-05-10 ENCOUNTER — Telehealth: Payer: Self-pay

## 2021-05-10 VITALS — BP 128/70 | HR 60 | Temp 98.1°F | Wt 171.8 lb

## 2021-05-10 DIAGNOSIS — N522 Drug-induced erectile dysfunction: Secondary | ICD-10-CM

## 2021-05-10 DIAGNOSIS — I252 Old myocardial infarction: Secondary | ICD-10-CM

## 2021-05-10 DIAGNOSIS — K732 Chronic active hepatitis, not elsewhere classified: Secondary | ICD-10-CM

## 2021-05-10 DIAGNOSIS — K219 Gastro-esophageal reflux disease without esophagitis: Secondary | ICD-10-CM | POA: Diagnosis not present

## 2021-05-10 DIAGNOSIS — E782 Mixed hyperlipidemia: Secondary | ICD-10-CM | POA: Diagnosis not present

## 2021-05-10 DIAGNOSIS — J449 Chronic obstructive pulmonary disease, unspecified: Secondary | ICD-10-CM | POA: Diagnosis not present

## 2021-05-10 DIAGNOSIS — I700 Atherosclerosis of aorta: Secondary | ICD-10-CM

## 2021-05-10 DIAGNOSIS — D692 Other nonthrombocytopenic purpura: Secondary | ICD-10-CM

## 2021-05-10 DIAGNOSIS — Z23 Encounter for immunization: Secondary | ICD-10-CM | POA: Diagnosis not present

## 2021-05-10 DIAGNOSIS — I5022 Chronic systolic (congestive) heart failure: Secondary | ICD-10-CM

## 2021-05-10 DIAGNOSIS — I10 Essential (primary) hypertension: Secondary | ICD-10-CM

## 2021-05-10 DIAGNOSIS — F17219 Nicotine dependence, cigarettes, with unspecified nicotine-induced disorders: Secondary | ICD-10-CM

## 2021-05-10 DIAGNOSIS — I200 Unstable angina: Secondary | ICD-10-CM | POA: Diagnosis not present

## 2021-05-10 DIAGNOSIS — N400 Enlarged prostate without lower urinary tract symptoms: Secondary | ICD-10-CM

## 2021-05-10 LAB — MICROALBUMIN, URINE WAIVED
Creatinine, Urine Waived: 200 mg/dL (ref 10–300)
Microalb, Ur Waived: 30 mg/L — ABNORMAL HIGH (ref 0–19)
Microalb/Creat Ratio: <30 mg/g (ref <30)

## 2021-05-10 MED ORDER — ATORVASTATIN CALCIUM 80 MG PO TABS: 80.0000 mg | ORAL_TABLET | Freq: Every day | ORAL | 4 refills | Status: DC

## 2021-05-10 MED ORDER — SILDENAFIL CITRATE 20 MG PO TABS: 20.0000 mg | ORAL_TABLET | Freq: Every day | ORAL | 4 refills | Status: DC | PRN

## 2021-05-10 MED ORDER — METOPROLOL TARTRATE 25 MG PO TABS: 12.5000 mg | ORAL_TABLET | Freq: Two times a day (BID) | ORAL | 4 refills | Status: DC

## 2021-05-10 NOTE — Assessment & Plan Note (Signed)
Chronic, ongoing with BP at goal today on recheck.  Recommend he monitor BP at home three mornings a week and document for provider, educated him on this.  Continue diet and exercise focus, DASH diet.  Labs: CBC, CMP, TSH.  Continue current medication regimen and collaboration with cardiology.

## 2021-05-10 NOTE — Assessment & Plan Note (Signed)
Ongoing.  Have highly recommended he schedule follow-up with GI to initiate treatment, discussed at length with him.  Continue collaboration with GI, Dr. Anna. 

## 2021-05-10 NOTE — Assessment & Plan Note (Signed)
As evidenced by bruising upper extremities with ASA use.  Recommend gentle skin care and monitor for wounds, if present immediately notify provider. 

## 2021-05-10 NOTE — Progress Notes (Signed)
BP 128/70 (BP Location: Left Arm)   Pulse 60   Temp 98.1 F (36.7 C) (Oral)   Wt 171 lb 12.8 oz (77.9 kg)   SpO2 97%   BMI 26.91 kg/m    Subjective:    Patient ID: Andrew Potts, male    DOB: 08-19-1950, 70 y.o.   MRN: [id number]  HPI: Andrew Potts is a 70 y.o. male  Chief Complaint  Patient presents with   Medication Refill    Patient is requesting a refill on his Sildenafil.   Epistaxis    Patient states he has been experiencing some nose bleeds. Patient states he has been having nose bleeds off and on for about a month. Patient states he spoke with his Cardiologist and was informed that it could be from him having his heat on and drying his nasal pathway up.    Medication Management    Patient states he has some questions about his current medications.    Needs refills on Sildenafil today.  HYPERLIPIDEMIA & HF Saw Tina at the HF clinic on 02/07/21 -- repeat echo on 01/24/21 noted EF 55 to 60%.  History of STEMI 07/28/20.  Does endorse nose bleeds -- has had 3 in past 2 months.  Is taking Effient, discussed with him need to use saline nasal spray daily at nose.    Aortic atherosclerosis noted on imaging 09/20/20. Hyperlipidemia status: good compliance Satisfied with current treatment?  yes Side effects:  no Medication compliance: good compliance Past cholesterol meds: none Supplements: none Aspirin:  no The ASCVD Risk score (Arnett DK, et al., 2019) failed to calculate for the following reasons:   The patient has a prior MI or stroke diagnosis Chest pain:  no Coronary artery disease:  no Family history CAD:  no Family history early CAD:  no  COPD Continues on Symbicort daily + Proair.  Does endorse cost of inhalers is expensive at this time due to donut hole.  Has continued to quit smoking since his STEMI. FEV1 76% -- August 2020 + FEV1/FVC 90%. COPD status: stable Satisfied with current treatment?: yes Oxygen use: no Dyspnea frequency: minimal  Cough  frequency: none Rescue inhaler frequency:  once a month Limitation of activity: no Productive cough: none Last Spirometry: August 2020 Pneumovax: Up to Date Influenza: Up to Date    HEPATITIS C Last saw GI on 06/24/2019.  No past drug use or tattoos.  Did have blood transfusion in 1973.  No excess alcohol use at home.  Plan was for liver elastography --treatment to be determined after this -- but has not yet returned to see GI due to other acute issues presenting.   Continues on Protonix daily for GERD. Duration since diagnosis: 2020 Hep C transmission: possibly via blood transfusion in 1973 Genotype: 1a Viral load:  340,000 international units/mL. Hepatology evaluation:yes Liver biopsy:no  Cirrhosis: no Antiviral therapy:not yet Hepatocellular carcinoma screening: no Esophageal varices screening/EGD: no Hepatitis A Vaccine: Not up to Date Hepatitis B Vaccine: unknown Pneumovax Vaccine: Up to Date    Relevant past medical, surgical, family and social history reviewed and updated as indicated. Interim medical history since our last visit reviewed. Allergies and medications reviewed and updated.  Review of Systems  Constitutional:  Negative for activity change, diaphoresis, fatigue and fever.  Respiratory:  Negative for cough, chest tightness, shortness of breath and wheezing.   Cardiovascular:  Negative for chest pain, palpitations and leg swelling.  Gastrointestinal: Negative.   Neurological: Negative.   Psychiatric/Behavioral:  Negative.     Per HPI unless specifically indicated above     Objective:    BP 128/70 (BP Location: Left Arm)   Pulse 60   Temp 98.1 F (36.7 C) (Oral)   Wt 171 lb 12.8 oz (77.9 kg)   SpO2 97%   BMI 26.91 kg/m   Wt Readings from Last 3 Encounters:  05/10/21 171 lb 12.8 oz (77.9 kg)  02/07/21 164 lb 8 oz (74.6 kg)  01/09/21 155 lb (70.3 kg)    Physical Exam Vitals and nursing note reviewed.  Constitutional:      General: He is awake. He  is not in acute distress.    Appearance: He is well-developed and well-groomed. He is not ill-appearing.  HENT:     Head: Normocephalic and atraumatic.     Right Ear: Hearing normal. No drainage.     Left Ear: Hearing normal. No drainage.  Eyes:     General: Lids are normal.        Right eye: No discharge.        Left eye: No discharge.     Conjunctiva/sclera: Conjunctivae normal.     Pupils: Pupils are equal, round, and reactive to light.  Neck:     Thyroid: No thyromegaly.     Vascular: No carotid bruit.     Trachea: Trachea normal.  Cardiovascular:     Rate and Rhythm: Normal rate and regular rhythm.     Heart sounds: Normal heart sounds, S1 normal and S2 normal. No murmur heard.   No gallop.  Pulmonary:     Effort: Pulmonary effort is normal.     Breath sounds: Normal breath sounds.  Abdominal:     General: Bowel sounds are normal. There is no distension.     Palpations: Abdomen is soft. There is no hepatomegaly.     Tenderness: There is no abdominal tenderness.     Comments:    Musculoskeletal:        General: Normal range of motion.     Cervical back: Normal range of motion and neck supple.     Right lower leg: No edema.     Left lower leg: No edema.  Skin:    General: Skin is warm and dry.     Capillary Refill: Capillary refill takes less than 2 seconds.     Comments: Scattered pale purple bruises noted to bilateral upper extremities.  Neurological:     Mental Status: He is alert and oriented to person, place, and time.     Deep Tendon Reflexes: Reflexes are normal and symmetric.  Psychiatric:        Attention and Perception: Attention normal.        Mood and Affect: Mood normal.        Speech: Speech normal.        Behavior: Behavior normal. Behavior is cooperative.        Thought Content: Thought content normal.    Results for orders placed or performed during the hospital encounter of 01/24/21  ECHOCARDIOGRAM COMPLETE  Result Value Ref Range   Ao pk vel  1.26 m/s   AV Area VTI 2.22 cm2   AR max vel 2.11 cm2   AV Mean grad 3.5 mmHg   AV Peak grad 6.3 mmHg   Single Plane A2C EF 29.5 %   Single Plane A4C EF 55.2 %   Calc EF 37.0 %   S' Lateral 3.64 cm   AV Area mean vel 1.98 cm2  Area-P 1/2 4.41 cm2      Assessment & Plan:   Problem List Items Addressed This Visit       Cardiovascular and Mediastinum   Essential hypertension    Chronic, ongoing with BP at goal today on recheck.  Recommend he monitor BP at home three mornings a week and document for provider, educated him on this.  Continue diet and exercise focus, DASH diet.  Labs: CBC, CMP, TSH.  Continue current medication regimen and collaboration with cardiology.      Relevant Medications   atorvastatin (LIPITOR) 80 MG tablet   metoprolol tartrate (LOPRESSOR) 25 MG tablet   sildenafil (REVATIO) 20 MG tablet   Other Relevant Orders   CBC with Differential/Platelet   Comprehensive metabolic panel   TSH   Microalbumin, Urine Waived   AMB Referral to [facility] Coordin[facility]   Unstable angina [facility])    Ongoing with STEMI 07/28/20, at this time will continue collaboration with cardiology and medication regimen as prescribed by them.      Relevant Medications   atorvastatin (LIPITOR) 80 MG tablet   metoprolol tartrate (LOPRESSOR) 25 MG tablet   sildenafil (REVATIO) 20 MG tablet   Senile purpura (HCC)    As evidenced by bruising upper extremities with ASA use.  Recommend gentle skin care and monitor for wounds, if present immediately notify provider.      Relevant Medications   atorvastatin (LIPITOR) 80 MG tablet   metoprolol tartrate (LOPRESSOR) 25 MG tablet   sildenafil (REVATIO) 20 MG tablet   Aortic atherosclerosis (HCC)    Noted on past imaging.  Recommend he continue daily statin and ASA for prevention + complete cessation of smoking.      Relevant Medications   atorvastatin (LIPITOR) 80 MG tablet   metoprolol tartrate (LOPRESSOR) 25 MG tablet   sildenafil  (REVATIO) 20 MG tablet   Chronic systolic heart failure (HCC) - Primary    Since STEMI 07/28/20.  At this time continue current medication regimen and collaboration with cardiology.  Euvolemic today.  Recommend: - Reminded to call for an overnight weight gain of >2 pounds or a weekly weight gain of >5 pounds - not adding salt to food and read food labels. Reviewed the importance of keeping daily sodium intake to 2000mg  daily.      Relevant Medications   atorvastatin (LIPITOR) 80 MG tablet   metoprolol tartrate (LOPRESSOR) 25 MG tablet   sildenafil (REVATIO) 20 MG tablet   Other Relevant Orders   CBC with Differential/Platelet   Comprehensive metabolic panel   [facility] Referral [facility]     Respiratory   COPD (chronic obstructive pulmonary disease) (HCC)    Chronic, stable.  Spirometry FEV1/FVC 90% and FEV1 76% in August 2020.   Recommend continued cessation.  Continue current medication regimen, discussed possible change to Anoro vs Symbicort but it is too costly at this time, consider for future and consider CCM referral in future.  He refuses Lung CA Screening CT at this time due to current medical bills, recommended he obtain this.  Return in 6 months with repeat spirometry.        Digestive   Chronic active hepatitis (HCC)    Ongoing.  Have highly recommended he schedule follow-up with GI to initiate treatment, discussed at length with him.  Continue collaboration with GI, Dr. Tobi Bastos.      Gastroesophageal reflux disease without esophagitis    Chronic, stable with Protonix, continue this and check Mag level today.  Relevant Orders   Magnesium     Nervous and Auditory   Nicotine dependence, cigarettes, w unsp disorders    Currently continues not to smoke, recommend continue cessation post MI.         Other   Erectile dysfunction    Refills sent in at this time.      History of ST elevation myocardial infarction (STEMI)    Stable at this time, event  took place 07/28/20.  Recommend he continue all current medications and collaboration with cardiology.       HLD (hyperlipidemia)    Chronic, ongoing with recent NSTEMI.  He will continue statin.  Lipid panel today.  Adjust meds as needed.      Relevant Medications   atorvastatin (LIPITOR) 80 MG tablet   metoprolol tartrate (LOPRESSOR) 25 MG tablet   sildenafil (REVATIO) 20 MG tablet   Other Relevant Orders   Comprehensive metabolic panel   Lipid Panel w/o Chol/HDL Ratio   AMB Referral to [facility] Coordin[facility]   Other Visit Diagnoses     Benign prostatic hyperplasia without lower urinary tract symptoms       PSA on labs today   Relevant Orders   PSA   Need for pneumococcal vaccine       PPSV23 provided today.   Relevant Orders   Pneumococcal polysaccharide vaccine 23-valent greater than or equal to 2yo subcutaneous/IM        Follow up plan: Return in about 6 months (around 11/08/2021) for HTN/HLD, HF, COPD -- spirometry needed.

## 2021-05-10 NOTE — Assessment & Plan Note (Addendum)
Ongoing with STEMI 07/28/20, at this time will continue collaboration with cardiology and medication regimen as prescribed by them. °

## 2021-05-10 NOTE — Patient Instructions (Signed)

## 2021-05-10 NOTE — Assessment & Plan Note (Signed)
Refills sent in at this time.  

## 2021-05-10 NOTE — Assessment & Plan Note (Signed)
Noted on past imaging.  Recommend he continue daily statin and ASA for prevention + complete cessation of smoking. 

## 2021-05-10 NOTE — Assessment & Plan Note (Signed)
Chronic, stable.  Spirometry FEV1/FVC 90% and FEV1 76% in August 2020.   Recommend continued cessation.  Continue current medication regimen, discussed possible change to Anoro vs Symbicort but it is too costly at this time, consider for future and consider CCM referral in future.  He refuses Lung CA Screening CT at this time due to current medical bills, recommended he obtain this.  Return in 6 months with repeat spirometry.

## 2021-05-10 NOTE — Assessment & Plan Note (Signed)
Chronic, ongoing with recent NSTEMI.  He will continue statin.  Lipid panel today.  Adjust meds as needed. 

## 2021-05-10 NOTE — Assessment & Plan Note (Signed)
Stable at this time, event took place 07/28/20.  Recommend he continue all current medications and collaboration with cardiology.  

## 2021-05-10 NOTE — Assessment & Plan Note (Signed)
Chronic, stable with Protonix, continue this and check Mag level today. 

## 2021-05-10 NOTE — Assessment & Plan Note (Signed)
Currently continues not to smoke, recommend continue cessation post MI.  

## 2021-05-10 NOTE — Assessment & Plan Note (Signed)
Since STEMI 07/28/20.  At this time continue current medication regimen and collaboration with cardiology.  Euvolemic today.  Recommend: - Reminded to call for an overnight weight gain of >2 pounds or a weekly weight gain of >5 pounds - not adding salt to food and read food labels. Reviewed the importance of keeping daily sodium intake to <2000mg daily. 

## 2021-05-10 NOTE — Chronic Care Management (AMB) (Signed)
  Chronic Care Management   Note  05/10/2021 Name: Andrew Potts MRN: [id number] DOB: 1950-07-30  [name] is a 70 y.o. year old male who is a primary care patient of [name], Barbaraann Faster, NP. I reached out to [employer] by phone today in response to a referral sent by Andrew Potts PCP.  Andrew Potts was given information about Chronic Care Management services today including:  CCM service includes personalized support from designated clinical staff supervised by his physician, including individualized plan of care and coordination with other care providers 24/7 contact phone numbers for assistance for urgent and routine care needs. Service will only be billed when office clinical staff spend 20 minutes or more in a month to coordinate care. Only one practitioner may furnish and bill the service in a calendar month. The patient may stop CCM services at any time (effective at the end of the month) by phone call to the office staff. The patient is responsible for co-pay (up to 20% after annual deductible is met) if co-pay is required by the individual health plan.   Patient agreed to services and verbal consent obtained.   Follow up plan: Telephone appointment with care management team member scheduled for:05/15/2021  Noreene Larsson, [address], [place], [place] [address] Direct Dial: [phone] [email or web address]

## 2021-05-11 LAB — CBC WITH DIFFERENTIAL/PLATELET
Basophils Absolute: 0 10*3/uL (ref 0.0–0.2)
Basos: 0 %
EOS (ABSOLUTE): 0.3 10*3/uL (ref 0.0–0.4)
Eos: 4 %
Hematocrit: 45.4 % (ref 37.5–51.0)
Hemoglobin: 15.5 g/dL (ref 13.0–17.7)
Immature Grans (Abs): 0 10*3/uL (ref 0.0–0.1)
Immature Granulocytes: 0 %
Lymphocytes Absolute: 2.4 10*3/uL (ref 0.7–3.1)
Lymphs: 33 %
MCH: 32.8 pg (ref 26.6–33.0)
MCHC: 34.1 g/dL (ref 31.5–35.7)
MCV: 96 fL (ref 79–97)
Monocytes Absolute: 0.8 10*3/uL (ref 0.1–0.9)
Monocytes: 12 %
Neutrophils Absolute: 3.7 10*3/uL (ref 1.4–7.0)
Neutrophils: 51 %
Platelets: 240 10*3/uL (ref 150–450)
RBC: 4.72 x10E6/uL (ref 4.14–5.80)
RDW: 12.3 % (ref 11.6–15.4)
WBC: 7.2 10*3/uL (ref 3.4–10.8)

## 2021-05-11 LAB — COMPREHENSIVE METABOLIC PANEL
ALT: 68 IU/L — ABNORMAL HIGH (ref 0–44)
AST: 52 IU/L — ABNORMAL HIGH (ref 0–40)
Albumin/Globulin Ratio: 1.6 (ref 1.2–2.2)
Albumin: 4.3 g/dL (ref 3.8–4.8)
Alkaline Phosphatase: 98 IU/L (ref 44–121)
BUN/Creatinine Ratio: 14 (ref 10–24)
BUN: 13 mg/dL (ref 8–27)
Bilirubin Total: 1 mg/dL (ref 0.0–1.2)
CO2: 25 mmol/L (ref 20–29)
Calcium: 9.4 mg/dL (ref 8.6–10.2)
Chloride: 98 mmol/L (ref 96–106)
Creatinine, Ser: 0.93 mg/dL (ref 0.76–1.27)
Globulin, Total: 2.7 g/dL (ref 1.5–4.5)
Glucose: 80 mg/dL (ref 70–99)
Potassium: 4.3 mmol/L (ref 3.5–5.2)
Sodium: 136 mmol/L (ref 134–144)
Total Protein: 7 g/dL (ref 6.0–8.5)
eGFR: 90 mL/min/{1.73_m2} (ref >59)

## 2021-05-11 LAB — MAGNESIUM: Magnesium: 2.1 mg/dL (ref 1.6–2.3)

## 2021-05-11 LAB — LIPID PANEL W/O CHOL/HDL RATIO
Cholesterol, Total: 130 mg/dL (ref 100–199)
HDL: 33 mg/dL — ABNORMAL LOW (ref >39)
LDL Chol Calc (NIH): 76 mg/dL (ref 0–99)
Triglycerides: 115 mg/dL (ref 0–149)
VLDL Cholesterol Cal: 21 mg/dL (ref 5–40)

## 2021-05-11 LAB — TSH: TSH: 0.943 u[IU]/mL (ref 0.450–4.500)

## 2021-05-11 LAB — PSA: Prostate Specific Ag, Serum: 1 ng/mL (ref 0.0–4.0)

## 2021-05-11 NOTE — Progress Notes (Signed)
Contacted via MyChart -- please call him in morning to check to see if received below message: Andrew Potts, your labs have returned.  Overall they remain very stable with exception of ongoing elevation in liver function tests.  These have trended up a little, I do want you to ensure that in the new year you return to GI ASAP to continue work-up and get treatment for your Hep C.  This is very important. Keep being amazing!!  Thank you for allowing me to participate in your care.  I appreciate you. Kindest regards, Demoni Gergen

## 2021-05-15 ENCOUNTER — Ambulatory Visit (INDEPENDENT_AMBULATORY_CARE_PROVIDER_SITE_OTHER): Payer: Medicare Other

## 2021-05-15 DIAGNOSIS — I10 Essential (primary) hypertension: Secondary | ICD-10-CM

## 2021-05-15 DIAGNOSIS — J449 Chronic obstructive pulmonary disease, unspecified: Secondary | ICD-10-CM

## 2021-05-15 DIAGNOSIS — E782 Mixed hyperlipidemia: Secondary | ICD-10-CM

## 2021-05-15 NOTE — Progress Notes (Signed)
Chronic Care Management Pharmacy Note  05/15/2021 Name:  Andrew Potts MRN:  [id number] DOB:  1950-08-03  Summary: Insurance reviewed for COPD - all maintenance inhalers tier 3, would need to switch to nebulizer solution for lower cost.  Will send Symbicort patient assistance Would encourage for patient to reach out to medicare counselor during open enrollment - [phone]  Subjective: Andrew Potts is an 70 y.o. year old male who is a primary patient of [name], Barbaraann Faster, NP.  The CCM team was consulted for assistance with disease management and care coordination needs.    Engaged with patient by telephone for initial visit in response to provider referral for pharmacy case management and/or care coordination services.   Consent to Services:  The patient was given the following information about Chronic Care Management services today, agreed to services, and gave verbal consent: 1. CCM service includes personalized support from designated clinical staff supervised by the primary care provider, including individualized plan of care and coordination with other care providers 2. 24/7 contact phone numbers for assistance for urgent and routine care needs. 3. Service will only be billed when office clinical staff spend 20 minutes or more in a month to coordinate care. 4. Only one practitioner may furnish and bill the service in a calendar month. 5.The patient may stop CCM services at any time (effective at the end of the month) by phone call to the office staff. 6. The patient will be responsible for cost sharing (co-pay) of up to 20% of the service fee (after annual deductible is met). Patient agreed to services and consent obtained.  Patient Care Team: Venita Lick, NP as PCP - General (Nurse Practitioner) Madelin Rear, [facility] (Pharmacist)  Hospital visits: None in previous 6 months  Objective:  Lab Results  Component Value Date   CREATININE 0.93 05/10/2021   CREATININE 0.77  09/28/2020   CREATININE 0.62 09/21/2020    Lab Results  Component Value Date   HGBA1C 5.2 07/29/2020   Last diabetic Eye exam: No results found for: HMDIABEYEEXA  Last diabetic Foot exam: No results found for: HMDIABFOOTEX      Component Value Date/Time   CHOL 130 05/10/2021 1156   CHOL 105 09/21/2020 0437   CHOL 117 08/10/2020 1531   CHOL 161 07/29/2020 0651   CHOL 183 05/03/2020 1338   TRIG 115 05/10/2021 1156   TRIG 56 09/21/2020 0437   TRIG 66 08/10/2020 1531   HDL 33 (L) 05/10/2021 1156   HDL 40 (L) 09/21/2020 0437   HDL 42 08/10/2020 1531   HDL 40 (L) 07/29/2020 0651   HDL 40 05/03/2020 1338   CHOLHDL 2.6 09/21/2020 0437   VLDL 11 09/21/2020 0437   LDLCALC 76 05/10/2021 1156   LDLCALC 54 09/21/2020 0437   LDLCALC 61 08/10/2020 1531   LDLCALC 106 (H) 07/29/2020 0651   LDLCALC 122 (H) 05/03/2020 1338    Hepatic Function Latest Ref Rng & Units 05/10/2021 09/28/2020 09/20/2020  Total Protein 6.0 - 8.5 g/dL 7.0 7.0 6.9  Albumin 3.8 - 4.8 g/dL 4.3 4.0 3.8  AST 0 - 40 IU/L 52(H) 48(H) 38  ALT 0 - 44 IU/L 68(H) 53(H) 54(H)  Alk Phosphatase 44 - 121 IU/L 98 81 74  Total Bilirubin 0.0 - 1.2 mg/dL 1.0 0.8 1.0  Bilirubin, Direct 0.0 - 0.2 mg/dL - - 0.2    Lab Results  Component Value Date/Time   TSH 0.943 05/10/2021 11:56 AM   TSH 1.440 08/10/2020 03:31 PM  CBC Latest Ref Rng & Units 05/10/2021 09/28/2020 09/22/2020  WBC 3.4 - 10.8 x10E3/uL 7.2 9.8 9.6  Hemoglobin 13.0 - 17.7 g/dL 15.5 13.9 14.8  Hematocrit 37.5 - 51.0 % 45.4 41.3 44.0  Platelets 150 - 450 x10E3/uL 240 333 191    No results found for: VD25OH  Clinical ASCVD:  The ASCVD Risk score (Arnett DK, et al., 2019) failed to calculate for the following reasons:   The patient has a prior MI or stroke diagnosis   Social History   Tobacco Use  Smoking Status Former   Packs/day: 0.50   Years: 47.00   Pack years: 23.50   Types: Cigarettes   Quit date: 07/28/2020   Years since quitting: 0.7  Smokeless  Tobacco Never  Tobacco Comments   08/24/20 given quit smoking paperwork and encouraged continued cessation   BP Readings from Last 3 Encounters:  05/10/21 128/70  02/07/21 120/66  12/29/20 117/65   Pulse Readings from Last 3 Encounters:  05/10/21 60  02/07/21 62  12/29/20 64   Wt Readings from Last 3 Encounters:  05/10/21 171 lb 12.8 oz (77.9 kg)  02/07/21 164 lb 8 oz (74.6 kg)  01/09/21 155 lb (70.3 kg)   BMI Readings from Last 3 Encounters:  05/10/21 26.91 kg/m  02/07/21 25.76 kg/m  01/09/21 24.28 kg/m    Assessment: Review of patient past medical history, allergies, medications, health status, including review of consultants reports, laboratory and other test data, was performed as part of comprehensive evaluation and provision of chronic care management services.   SDOH:  (Social Determinants of Health) assessments and interventions performed: Yes   CCM Care Plan  Allergies  Allergen Reactions   Levaquin [Levofloxacin] Swelling   Codeine Itching    Abdominal pain, nose itching   Codone [Hydrocodone] Other (See Comments)    Abdominal pain, felt loopy    Medications Reviewed Today     Reviewed by Madelin Rear, [facility] (Pharmacist) on 05/15/21 at 87  Med List Status: <None>   Medication Order Taking? Sig Documenting Provider Last Dose Status Informant  aspirin EC 81 MG EC tablet [id number] No Take 1 tablet (81 mg total) by mouth daily. Swallow whole. Shawna Clamp, MD Taking Active Self  atorvastatin (LIPITOR) 80 MG tablet [id number]  Take 1 tablet (80 mg total) by mouth daily. Marnee Guarneri T, NP  Active   Ensure (ENSURE) [id number] No Take 237 mLs by mouth daily. [provider] Taking Active Self  isosorbide mononitrate (IMDUR) 30 MG 24 hr tablet [id number] No Take 1 tablet (30 mg total) by mouth daily. Lorella Nimrod, MD Taking Active Self  lisinopril (ZESTRIL) 5 MG tablet [id number] No Take 1 tablet (5 mg total) by mouth daily. Marnee Guarneri T, NP Taking  Active Self  metoprolol tartrate (LOPRESSOR) 25 MG tablet [id number]  Take 0.5 tablets (12.5 mg total) by mouth 2 (two) times daily. Marnee Guarneri T, NP  Active   nitroGLYCERIN (NITROSTAT) 0.4 MG SL tablet [id number] No Place 1 tablet (0.4 mg total) under the tongue every 5 (five) minutes as needed for chest pain. Shawna Clamp, MD Taking Active Self  pantoprazole (PROTONIX) 40 MG tablet [id number] No Take 1 tablet (40 mg total) by mouth daily. Ronny Bacon, MD Taking Active   prasugrel (EFFIENT) 10 MG TABS tablet [id number] No Take 10 mg by mouth daily. [provider] Taking Active Self  PROAIR HFA 108 [phone] Base) MCG/ACT inhaler [id number] No Inhale 2 puffs into the lungs every 4 (four) hours as needed.  Marnee Guarneri T, NP Taking Active   sildenafil (REVATIO) 20 MG tablet [id number]  Take 1 tablet (20 mg total) by mouth daily as needed (erectile dysfunction). Marnee Guarneri T, NP  Active   SYMBICORT 160-4.5 MCG/ACT inhaler [id number] No Inhale 2 puffs into the lungs 2 (two) times daily. Venita Lick, NP Taking Active             Patient Active Problem List   Diagnosis Date Noted   Gastroesophageal reflux disease without esophagitis 28/76/8115   Chronic systolic heart failure ([place]) 12/29/2020   S/P laparoscopic cholecystectomy 12/15/2020   Aortic atherosclerosis ([place]) 10/11/2020   Senile purpura ([place]) 09/28/2020   Unstable angina ([place]) 09/20/2020   CAD, multiple vessel 09/20/2020   History of ST elevation myocardial infarction (STEMI) 07/28/2020   HLD (hyperlipidemia) 07/28/2020   Chronic active hepatitis ([place]) 03/11/2019   Erectile dysfunction 02/04/2019   COPD (chronic obstructive pulmonary disease) ([place]) 02/04/2019   Nicotine dependence, cigarettes, w unsp disorders 02/04/2019   Essential hypertension 02/04/2019    Immunization History  Administered Date(s) Administered   Influenza, High Dose Seasonal PF 03/21/2019, 04/07/2020   Influenza,inj,Quad PF,6+ Mos  04/07/2018   Influenza-Unspecified 04/07/2021   PFIZER(Purple Top)SARS-COV-2 Vaccination 10/07/2019, 11/04/2019, 05/01/2020, 11/08/2020   Pneumococcal Conjugate-13 03/21/2019   Pneumococcal Polysaccharide-23 05/10/2021   Conditions to be addressed/monitored: HLD COPD HTN ED  Care Plan : ccm pharmacy care plan  Updates made by Madelin Rear, [place] since 05/15/2021 12:00 AM     Problem: hld htn copd chf   Priority: High     Long-Range Goal: disease management   Start Date: 05/15/2021  Expected End Date: 05/15/2022  This Visit's Progress: On track  Priority: High      Current Barriers:  Unable to independently afford treatment regimen  Pharmacist Clinical Goal(s):  Patient will  pursue patient assistance as appropriate  through collaboration with PharmD and provider.   Interventions: 1:1 collaboration with Venita Lick, NP regarding development and update of comprehensive plan of care as evidenced by provider attestation and co-signature Inter-disciplinary care team collaboration (see longitudinal plan of care) Comprehensive medication review performed; medication list updated in electronic medical record  Hypertension (BP goal <130/80) -Controlled -heart attack January - two stents placed -Current treatment: Lisinopril 5 mg once daily Metoprolol tartrate 25 mg tab - half tab daily Isosorbide mononitrate 30 mg 24 hr once daily   -Current home readings: not testing -Current dietary habits: lower carb now, less processed and fried foods after heart attack -Current exercise habits: goal to get back to the [employer] now. -Denies hypotensive/hypertensive symptoms -Educated on BP goals and benefits of medications for prevention of heart attack, stroke and kidney damage; -Counseled to monitor BP at home as directed, document, and provide log at future appointments -Recommended to continue current medication  Hyperlipidemia: (LDL goal < 70) -Not ideally controlled -Current  treatment: Atorvastatin 80 mg once daily  -Medications previously tried: n/a  -Current dietary patterns: see htn -Current exercise habits: see thn -Educated on Cholesterol goals;  Benefits of statin for ASCVD risk reduction; -Counseled on diet and exercise extensively Recommended to continue current medication  COPD (Goal: control symptoms and prevent exacerbations) -Controlled -Current treatment  Albuterol inhaler prn Symbicort  -Gold Grade: Gold 2 (FEV1 50-79%) -Current COPD Classification:  A (low sx, <2 exacerbations/yr) -MMRC/CAT score: 8 -Pulmonary function testing: 2020 -Exacerbations requiring treatment in last 6 months: 0 -Patient reports consistent use of maintenance inhaler -Frequency of rescue inhaler use: prn -Counseled on Proper  inhaler technique; Benefits of consistent maintenance inhaler use -Recommended to continue current medication  Patient Goals/Self-Care Activities Patient will:  - target a minimum of 150 minutes of moderate intensity exercise weekly  Medication Assistance: Application for symbicort  medication assistance program. in process.  Anticipated assistance start date 05/2021.  See plan of care for additional detail.  Patient's preferred pharmacy is:  [place], [place] [address] [place] [address] Phone: [phone] Fax: [phone]  Pt endorses 100% compliance  Follow Up:  Patient agrees to Care Plan and Follow-up. Plan: HC PAP completion for Symbicort. Pharmacist f/u 6 months.  Future Appointments  Date Time Provider [place]  11/08/2021  9:00 AM Marnee Guarneri T, NP [facility]  01/12/2022  9:45 AM CFP NURSE HEALTH ADVISOR CFP-CFP [place], PharmD, [place] Pharmacist  [phone]

## 2021-05-15 NOTE — Patient Instructions (Signed)
Andrew Potts,  Thank you for talking with me today. I have included our care plan/goals in the following pages.   Please review and call me at [phone] with any questions.  Thanks! Ellin Mayhew, PharmD Clinical Pharmacist  [phone]  Care Plan : ccm pharmacy care plan  Updates made by Madelin Rear, [facility] since 05/15/2021 12:00 AM     Problem: hld htn copd chf   Priority: High     Long-Range Goal: disease management   Start Date: 05/15/2021  Expected End Date: 05/15/2022  This Visit's Progress: On track  Priority: High     The patient was given the following information about Chronic Care Management services today, agreed to services, and gave verbal consent: 1. CCM service includes personalized support from designated clinical staff supervised by the primary care provider, including individualized plan of care and coordination with other care providers 2. 24/7 contact phone numbers for assistance for urgent and routine care needs. 3. Service will only be billed when office clinical staff spend 20 minutes or more in a month to coordinate care. 4. Only one practitioner may furnish and bill the service in a calendar month. 5.The patient may stop CCM services at any time (effective at the end of the month) by phone call to the office staff. 6. The patient will be responsible for cost sharing (co-pay) of up to 20% of the service fee (after annual deductible is met). Patient agreed to services and consent obtained.  The patient verbalized understanding of instructions provided today and agreed to receive a MyChart copy of patient instruction and/or educational materials. Telephone follow up appointment with pharmacy team member scheduled for: See next appointment with "Care Management Staff" under "What's Next" below.  High Cholesterol High cholesterol is a condition in which the blood has high levels of a white, waxy substance similar to fat (cholesterol). The liver makes all  the cholesterol that the body needs. The human body needs small amounts of cholesterol to help build cells. A person gets extra or excess cholesterol from the food that he or she eats. The blood carries cholesterol from the liver to the rest of the body. If you have high cholesterol, deposits (plaques) may build up on the walls of your arteries. Arteries are the blood vessels that carry blood away from your heart. These plaques make the arteries narrow and stiff. Cholesterol plaques increase your risk for heart attack and stroke. Work with your health care provider to keep your cholesterol levels in a healthy range. What increases the risk? The following factors may make you more likely to develop this condition: Eating foods that are high in animal fat (saturated fat) or cholesterol. Being overweight. Not getting enough exercise. A family history of high cholesterol (familial hypercholesterolemia). Use of tobacco products. Having diabetes. What are the signs or symptoms? In most cases, high cholesterol does not usually cause any symptoms. In severe cases, very high cholesterol levels can cause: Fatty bumps under the skin (xanthomas). A white or gray ring around the black center (pupil) of the eye. How is this diagnosed? This condition may be diagnosed based on the results of a blood test. If you are older than 68 years of age, your health care provider may check your cholesterol levels every 4-6 years. You may be checked more often if you have high cholesterol or other risk factors for heart disease. The blood test for cholesterol measures: "Bad" cholesterol, or LDL cholesterol. This is the main  type of cholesterol that causes heart disease. The desired level is less than 100 mg/dL (2.59 mmol/L). "Good" cholesterol, or HDL cholesterol. HDL helps protect against heart disease by cleaning the arteries and carrying the LDL to the liver for processing. The desired level for HDL is 60 mg/dL (1.55  mmol/L) or higher. Triglycerides. These are fats that your body can store or burn for energy. The desired level is less than 150 mg/dL (1.69 mmol/L). Total cholesterol. This measures the total amount of cholesterol in your blood and includes LDL, HDL, and triglycerides. The desired level is less than 200 mg/dL (5.17 mmol/L). How is this treated? Treatment for high cholesterol starts with lifestyle changes, such as diet and exercise. Diet changes. You may be asked to eat foods that have more fiber and less saturated fats or added sugar. Lifestyle changes. These may include regular exercise, maintaining a healthy weight, and quitting use of tobacco products. Medicines. These are given when diet and lifestyle changes have not worked. You may be prescribed a statin medicine to help lower your cholesterol levels. Follow these instructions at home: Eating and drinking  Eat a healthy, balanced diet. This diet includes: Daily servings of a variety of fresh, frozen, or canned fruits and vegetables. Daily servings of whole grain foods that are rich in fiber. Foods that are low in saturated fats and trans fats. These include poultry and fish without skin, lean cuts of meat, and low-fat dairy products. A variety of fish, especially oily fish that contain omega-3 fatty acids. Aim to eat fish at least 2 times a week. Avoid foods and drinks that have added sugar. Use healthy cooking methods, such as roasting, grilling, broiling, baking, poaching, steaming, and stir-frying. Do not fry your food except for stir-frying. If you drink alcohol: Limit how much you have to: 0-1 drink a day for women who are not pregnant. 0-2 drinks a day for men. Know how much alcohol is in a drink. In the [place] one drink equals one 12 oz bottle of beer (355 mL), one 5 oz glass of wine (148 mL), or one 1 oz glass of hard liquor (44 mL). Lifestyle  Get regular exercise. Aim to exercise for a total of 150 minutes a week. Increase  your activity level by doing activities such as gardening, walking, and taking the stairs. Do not use any products that contain nicotine or tobacco. These products include cigarettes, chewing tobacco, and vaping devices, such as e-cigarettes. If you need help quitting, ask your health care provider. General instructions Take over-the-counter and prescription medicines only as told by your health care provider. Keep all follow-up visits. This is important. Where to find more information American Heart Association: www.heart.org National Heart, Lung, and Blood Institute: https://wilson-eaton.com/ Contact a health care provider if: You have trouble achieving or maintaining a healthy diet or weight. You are starting an exercise program. You are unable to stop smoking. Get help right away if: You have chest pain. You have trouble breathing. You have discomfort or pain in your jaw, neck, back, shoulder, or arm. You have any symptoms of a stroke. "BE FAST" is an easy way to remember the main warning signs of a stroke: B - Balance. Signs are dizziness, sudden trouble walking, or loss of balance. E - Eyes. Signs are trouble seeing or a sudden change in vision. F - Face. Signs are sudden weakness or numbness of the face, or the face or eyelid drooping on one side. A - Arms. Signs are weakness  or numbness in an arm. This happens suddenly and usually on one side of the body. S - Speech. Signs are sudden trouble speaking, slurred speech, or trouble understanding what people say. T - Time. Time to call emergency services. Write down what time symptoms started. You have other signs of a stroke, such as: A sudden, severe headache with no known cause. Nausea or vomiting. Seizure. These symptoms may represent a serious problem that is an emergency. Do not wait to see if the symptoms will go away. Get medical help right away. Call your local emergency services (911 in the [place] Do not drive yourself to the  hospital. Summary Cholesterol plaques increase your risk for heart attack and stroke. Work with your health care provider to keep your cholesterol levels in a healthy range. Eat a healthy, balanced diet, get regular exercise, and maintain a healthy weight. Do not use any products that contain nicotine or tobacco. These products include cigarettes, chewing tobacco, and vaping devices, such as e-cigarettes. Get help right away if you have any symptoms of a stroke. This information is not intended to replace advice given to you by your health care provider. Make sure you discuss any questions you have with your health care provider. Document Revised: 09/15/2020 Document Reviewed: 09/05/2020 Elsevier Patient Education  2022 [employer].

## 2021-05-19 ENCOUNTER — Telehealth: Payer: Self-pay

## 2021-05-19 NOTE — Chronic Care Management (AMB) (Signed)
    Chronic Care Management Pharmacy Assistant   Name: Andrew Potts  MRN: [id number] DOB: 01-01-1951  Reason for Encounter: Patient Assistance   Sent out patient assistance form for Symbicort. Attached instructions for forms and my contact information if needed.      Salli Real, CMA

## 2021-06-01 DIAGNOSIS — I2510 Atherosclerotic heart disease of native coronary artery without angina pectoris: Secondary | ICD-10-CM | POA: Diagnosis not present

## 2021-06-01 DIAGNOSIS — J449 Chronic obstructive pulmonary disease, unspecified: Secondary | ICD-10-CM | POA: Diagnosis not present

## 2021-06-01 DIAGNOSIS — I10 Essential (primary) hypertension: Secondary | ICD-10-CM | POA: Diagnosis not present

## 2021-06-01 DIAGNOSIS — I200 Unstable angina: Secondary | ICD-10-CM | POA: Diagnosis not present

## 2021-06-01 DIAGNOSIS — R030 Elevated blood-pressure reading, without diagnosis of hypertension: Secondary | ICD-10-CM | POA: Diagnosis not present

## 2021-06-21 ENCOUNTER — Telehealth: Payer: Self-pay

## 2021-06-21 NOTE — Chronic Care Management (AMB) (Signed)
    Chronic Care Management Pharmacy Assistant   Name: KADRIAN PARTCH  MRN: [id number] DOB: [birth year]  Reason for Encounter: Disease State General  Recent office visits:  None noted  Recent consult visits:  None noted  Hospital visits:  None in previous 6 months  Medications: Outpatient Encounter Medications as of 06/21/2021  Medication Sig   aspirin EC 81 MG EC tablet Take 1 tablet (81 mg total) by mouth daily. Swallow whole.   atorvastatin (LIPITOR) 80 MG tablet Take 1 tablet (80 mg total) by mouth daily.   Ensure (ENSURE) Take 237 mLs by mouth daily.   isosorbide mononitrate (IMDUR) 30 MG 24 hr tablet Take 1 tablet (30 mg total) by mouth daily.   lisinopril (ZESTRIL) 5 MG tablet Take 1 tablet (5 mg total) by mouth daily.   metoprolol tartrate (LOPRESSOR) 25 MG tablet Take 0.5 tablets (12.5 mg total) by mouth 2 (two) times daily.   nitroGLYCERIN (NITROSTAT) 0.4 MG SL tablet Place 1 tablet (0.4 mg total) under the tongue every 5 (five) minutes as needed for chest pain.   pantoprazole (PROTONIX) 40 MG tablet Take 1 tablet (40 mg total) by mouth daily.   prasugrel (EFFIENT) 10 MG TABS tablet Take 10 mg by mouth daily.   PROAIR HFA 108 (90 Base) MCG/ACT inhaler Inhale 2 puffs into the lungs every 4 (four) hours as needed.   sildenafil (REVATIO) 20 MG tablet Take 1 tablet (20 mg total) by mouth daily as needed (erectile dysfunction).   SYMBICORT 160-4.5 MCG/ACT inhaler Inhale 2 puffs into the lungs 2 (two) times daily.   No facility-administered encounter medications on file as of 06/21/2021.   Have you had any problems recently with your health? Patient states he has been doing well.  Have you had any problems with your pharmacy? Patient states he has no problems with his pharmacy.  What issues or side effects are you having with your medications? Patient states he has no issues or side effects to any of his medications.  What would you like me to pass along to Gerilyn Pilgrim  Potts,CPP for them to help you with?  Patient states he has completed PAP process for Symbicort and has received the Symbicort from the Manufacture and has 3 months worth. Patient states his he will be getting new insurance [employer], patient was unsure if he would continue to get his Symbicort threw the PAP manufacture since he will have new insurance. Patient was told by his new insurance that his medications should be all covered and wouldn't need to pay anything for his medications.  What can we do to take care of you better? Patient states there is nothing at this time.  Care Gaps: COVID-19 Vaccine:Overdue since 01/03/2021  Star Rating Drugs: Atorvastatin 80 mg Last filled:06/13/21 30 DS Lisinopril 5 mg Last filled:04/07/21 90 DS  Myriam Carolin Coy, RMA [facility]

## 2021-07-13 ENCOUNTER — Other Ambulatory Visit: Payer: Self-pay | Admitting: Surgery

## 2021-08-23 ENCOUNTER — Other Ambulatory Visit: Payer: Self-pay | Admitting: Nurse Practitioner

## 2021-08-24 NOTE — Telephone Encounter (Signed)
Requested Prescriptions  Pending Prescriptions Disp Refills   sildenafil (REVATIO) 20 MG tablet [Pharmacy Med Name: SILDENAFIL 20 MG TABLET] 10 tablet 2    Sig: Take 1 tablet (20 mg total) by mouth daily as needed (erectile dysfunction).     Urology: Erectile Dysfunction Agents Failed - 08/23/2021  1:27 PM      Failed - AST in normal range and within 360 days    AST  Date Value Ref Range Status  05/10/2021 52 (H) 0 - 40 IU/L Final         Failed - ALT in normal range and within 360 days    ALT  Date Value Ref Range Status  05/10/2021 68 (H) 0 - 44 IU/L Final         Passed - Last BP in normal range    BP Readings from Last 1 Encounters:  05/10/21 128/70         Passed - Valid encounter within last 12 months    Recent Outpatient Visits          3 months ago Chronic systolic heart failure (HCC)   [facility] Beemer, Jolene T, NP   7 months ago Chronic systolic heart failure (HCC)   [facility] [name], Corrie Dandy T, NP   9 months ago Cholecystostomy care [facility])   [facility] [name], Corrie Dandy T, NP   9 months ago Cholecystostomy care [facility])   [facility] Arkoma, Marble T, NP   11 months ago STEMI involving right coronary artery (HCC)   [facility] [name], Dorie Rank, NP      Future Appointments            In 2 months [name], Dorie Rank, NP [facility]   In 4 months  [facility]

## 2021-08-28 ENCOUNTER — Other Ambulatory Visit: Payer: Self-pay | Admitting: Nurse Practitioner

## 2021-08-28 MED ORDER — ALBUTEROL SULFATE HFA 108 (90 BASE) MCG/ACT IN AERS: 2.0000 | INHALATION_SPRAY | Freq: Four times a day (QID) | RESPIRATORY_TRACT | 5 refills | Status: DC | PRN

## 2021-08-28 NOTE — Progress Notes (Signed)
albuterol

## 2021-09-04 DIAGNOSIS — I2510 Atherosclerotic heart disease of native coronary artery without angina pectoris: Secondary | ICD-10-CM | POA: Diagnosis not present

## 2021-09-04 DIAGNOSIS — I252 Old myocardial infarction: Secondary | ICD-10-CM | POA: Diagnosis not present

## 2021-09-04 DIAGNOSIS — N529 Male erectile dysfunction, unspecified: Secondary | ICD-10-CM | POA: Diagnosis not present

## 2021-09-04 DIAGNOSIS — Z881 Allergy status to other antibiotic agents status: Secondary | ICD-10-CM | POA: Diagnosis not present

## 2021-09-04 DIAGNOSIS — Z8249 Family history of ischemic heart disease and other diseases of the circulatory system: Secondary | ICD-10-CM | POA: Diagnosis not present

## 2021-09-04 DIAGNOSIS — Z87891 Personal history of nicotine dependence: Secondary | ICD-10-CM | POA: Diagnosis not present

## 2021-09-04 DIAGNOSIS — R079 Chest pain, unspecified: Secondary | ICD-10-CM | POA: Diagnosis not present

## 2021-09-04 DIAGNOSIS — Z008 Encounter for other general examination: Secondary | ICD-10-CM | POA: Diagnosis not present

## 2021-09-04 DIAGNOSIS — Z7982 Long term (current) use of aspirin: Secondary | ICD-10-CM | POA: Diagnosis not present

## 2021-09-04 DIAGNOSIS — K219 Gastro-esophageal reflux disease without esophagitis: Secondary | ICD-10-CM | POA: Diagnosis not present

## 2021-09-04 DIAGNOSIS — I10 Essential (primary) hypertension: Secondary | ICD-10-CM | POA: Diagnosis not present

## 2021-09-04 DIAGNOSIS — E785 Hyperlipidemia, unspecified: Secondary | ICD-10-CM | POA: Diagnosis not present

## 2021-09-04 DIAGNOSIS — Z833 Family history of diabetes mellitus: Secondary | ICD-10-CM | POA: Diagnosis not present

## 2021-09-05 ENCOUNTER — Other Ambulatory Visit: Payer: Self-pay

## 2021-09-05 ENCOUNTER — Ambulatory Visit (INDEPENDENT_AMBULATORY_CARE_PROVIDER_SITE_OTHER): Payer: Medicare HMO | Admitting: Nurse Practitioner

## 2021-09-05 ENCOUNTER — Encounter: Payer: Self-pay | Admitting: Nurse Practitioner

## 2021-09-05 VITALS — BP 111/70 | HR 69 | Temp 97.9°F | Ht 67.0 in | Wt 175.4 lb

## 2021-09-05 DIAGNOSIS — D692 Other nonthrombocytopenic purpura: Secondary | ICD-10-CM | POA: Diagnosis not present

## 2021-09-05 DIAGNOSIS — R600 Localized edema: Secondary | ICD-10-CM | POA: Insufficient documentation

## 2021-09-05 DIAGNOSIS — K732 Chronic active hepatitis, not elsewhere classified: Secondary | ICD-10-CM

## 2021-09-05 DIAGNOSIS — J449 Chronic obstructive pulmonary disease, unspecified: Secondary | ICD-10-CM | POA: Diagnosis not present

## 2021-09-05 DIAGNOSIS — I200 Unstable angina: Secondary | ICD-10-CM

## 2021-09-05 DIAGNOSIS — I5022 Chronic systolic (congestive) heart failure: Secondary | ICD-10-CM | POA: Diagnosis not present

## 2021-09-05 DIAGNOSIS — I10 Essential (primary) hypertension: Secondary | ICD-10-CM | POA: Diagnosis not present

## 2021-09-05 MED ORDER — FUROSEMIDE 20 MG PO TABS: 20.0000 mg | ORAL_TABLET | Freq: Every day | ORAL | 0 refills | Status: DC

## 2021-09-05 MED ORDER — FUROSEMIDE 20 MG PO TABS: 10.0000 mg | ORAL_TABLET | Freq: Every day | ORAL | 0 refills | Status: DC

## 2021-09-05 NOTE — Assessment & Plan Note (Signed)
Chronic, stable.  Spirometry FEV1/FVC 90% and FEV1 76% in August 2020.   Recommend continued cessation.  Continue current medication regimen, discussed possible change to Anoro vs Symbicort but it is too costly at this time, consider for future and consider CCM referral in future.  He refuses Lung CA Screening CT at this time due to current medical bills, recommended he obtain this.  Return in 6 months with repeat spirometry. 

## 2021-09-05 NOTE — Assessment & Plan Note (Addendum)
Chronic, ongoing with BP at goal today.  Recommend he monitor BP at home three mornings a week and document for provider, educated him on this.  Continue diet and exercise focus, DASH diet.  Labs: CBC, CMP, TSH.  Continue current medication regimen and collaboration with cardiology.

## 2021-09-05 NOTE — Progress Notes (Signed)
BP 111/70    Pulse 69    Temp 97.9 F (36.6 C) (Oral)    Ht $R'5\' 7"'Sj$  (1.702 m)    Wt 175 lb 6.4 oz (79.6 kg)    SpO2 93%    BMI 27.47 kg/m    Subjective:    Patient ID: Andrew Potts, male    DOB: July 16, 1951, 71 y.o.   MRN: [id number]  HPI: Andrew Potts is a 71 y.o. male  Chief Complaint  Patient presents with   Leg Swelling    Patient states he has noticed some swelling in both legs and states it started in his feet. Patient states his home nurse stopped by yesterday and informed him that it may be an issue with his heart, kidneys or a blood clot. She advised him to come in to see his PCP.    HYPERTENSION/HF/HLD Followed by cardiology with last visit 06/01/21 -- he stopped Effient and discontinued Imdur and NTG, is okay to take Viagra as needed. Saw Tina at the HF clinic on 02/07/21 last -- repeat echo on 01/24/21 noted EF 55 to 60%. History of STEMI 07/28/20.  Reports his [employer] nurse came and recommended he be seen.  He started with swelling to right foot about one month ago and then swelling to both ankles at times.  During day it starts to swell, then when wakes up swelling is gone.  When elevates legs there is no swelling.    Does endorse some SOB, which he reports telling cardiologist about but was told "it was my belly".  Notices with bending over to tie shoes or walking to mailbox.  Lies on one pillow at night with no SOB.  Does not add salt to anything at home, but feels he may be eating more salty foods then he should.  Yesterday had bacon in his beans.  No alcohol use.  Has no returned to see GI about past chronic Hep C diagnosis - has not seen since October 2020.    Aortic atherosclerosis noted on imaging 09/20/20. Hypertension status: stable  Satisfied with current treatment? yes Duration of hypertension: chronic BP monitoring frequency:  not checking BP range:  BP medication side effects:  no Medication compliance: good compliance Previous BP meds: Aspirin: yes Recurrent  headaches: no Visual changes: no Palpitations: no Dyspnea: yes -- at times even with tying shoes and as above Chest pain: no Lower extremity edema: yes Dizzy/lightheaded:  occasional with getting up in morning    COPD Continues on Symbicort and Albuterol. COPD status: stable Satisfied with current treatment?: yes Oxygen use: no Dyspnea frequency:  Cough frequency:  Rescue inhaler frequency:   Limitation of activity: no Productive cough:  Last Spirometry:  Pneumovax: Up to Date Influenza: Up to Date   Relevant past medical, surgical, family and social history reviewed and updated as indicated. Interim medical history since our last visit reviewed. Allergies and medications reviewed and updated.  Review of Systems  Constitutional:  Negative for activity change, diaphoresis, fatigue and fever.  Respiratory:  Positive for shortness of breath. Negative for cough, chest tightness and wheezing.   Cardiovascular:  Positive for leg swelling. Negative for chest pain and palpitations.  Gastrointestinal: Negative.   Neurological: Negative.   Psychiatric/Behavioral: Negative.     Per HPI unless specifically indicated above     Objective:    BP 111/70    Pulse 69    Temp 97.9 F (36.6 C) (Oral)    Ht $R'5\' 7"'lu$  (1.702 m)  Wt 175 lb 6.4 oz (79.6 kg)    SpO2 93%    BMI 27.47 kg/m   Wt Readings from Last 3 Encounters:  09/05/21 175 lb 6.4 oz (79.6 kg)  05/10/21 171 lb 12.8 oz (77.9 kg)  02/07/21 164 lb 8 oz (74.6 kg)    Physical Exam Vitals and nursing note reviewed.  Constitutional:      General: He is awake. He is not in acute distress.    Appearance: He is well-developed and well-groomed. He is not ill-appearing.  HENT:     Head: Normocephalic and atraumatic.     Right Ear: Hearing normal. No drainage.     Left Ear: Hearing normal. No drainage.  Eyes:     General: Lids are normal.        Right eye: No discharge.        Left eye: No discharge.     Conjunctiva/sclera:  Conjunctivae normal.     Pupils: Pupils are equal, round, and reactive to light.  Neck:     Thyroid: No thyromegaly.     Vascular: No carotid bruit.     Trachea: Trachea normal.  Cardiovascular:     Rate and Rhythm: Normal rate and regular rhythm.     Heart sounds: Normal heart sounds, S1 normal and S2 normal. No murmur heard.   No gallop.  Pulmonary:     Effort: Pulmonary effort is normal.     Breath sounds: Normal breath sounds.  Abdominal:     General: Bowel sounds are normal. There is no distension.     Palpations: Abdomen is soft. There is no hepatomegaly.     Tenderness: There is no abdominal tenderness.     Comments:    Musculoskeletal:        General: Normal range of motion.     Cervical back: Normal range of motion and neck supple.     Right lower leg: Edema (trace) present.     Left lower leg: Edema (trace) present.  Skin:    General: Skin is warm and dry.     Capillary Refill: Capillary refill takes less than 2 seconds.     Comments: Scattered pale purple bruises noted to bilateral upper extremities.  Neurological:     Mental Status: He is alert and oriented to person, place, and time.     Deep Tendon Reflexes: Reflexes are normal and symmetric.  Psychiatric:        Attention and Perception: Attention normal.        Mood and Affect: Mood normal.        Speech: Speech normal.        Behavior: Behavior normal. Behavior is cooperative.        Thought Content: Thought content normal.    Results for orders placed or performed in visit on 05/10/21  CBC with Differential/Platelet  Result Value Ref Range   WBC 7.2 3.4 - 10.8 x10E3/uL   RBC 4.72 4.14 - 5.80 x10E6/uL   Hemoglobin 15.5 13.0 - 17.7 g/dL   Hematocrit 45.4 37.5 - 51.0 %   MCV 96 79 - 97 fL   MCH 32.8 26.6 - 33.0 pg   MCHC 34.1 31.5 - 35.7 g/dL   RDW 12.3 11.6 - 15.4 %   Platelets 240 150 - 450 x10E3/uL   Neutrophils 51 Not Estab. %   Lymphs 33 Not Estab. %   Monocytes 12 Not Estab. %   Eos 4 Not  Estab. %   Basos 0 Not  Estab. %   Neutrophils Absolute 3.7 1.4 - 7.0 x10E3/uL   Lymphocytes Absolute 2.4 0.7 - 3.1 x10E3/uL   Monocytes Absolute 0.8 0.1 - 0.9 x10E3/uL   EOS (ABSOLUTE) 0.3 0.0 - 0.4 x10E3/uL   Basophils Absolute 0.0 0.0 - 0.2 x10E3/uL   Immature Granulocytes 0 Not Estab. %   Immature Grans (Abs) 0.0 0.0 - 0.1 x10E3/uL  Comprehensive metabolic panel  Result Value Ref Range   Glucose 80 70 - 99 mg/dL   BUN 13 8 - 27 mg/dL   Creatinine, Ser 0.93 0.76 - 1.27 mg/dL   eGFR 90 >59 mL/min/1.73   BUN/Creatinine Ratio 14 10 - 24   Sodium 136 134 - 144 mmol/L   Potassium 4.3 3.5 - 5.2 mmol/L   Chloride 98 96 - 106 mmol/L   CO2 25 20 - 29 mmol/L   Calcium 9.4 8.6 - 10.2 mg/dL   Total Protein 7.0 6.0 - 8.5 g/dL   Albumin 4.3 3.8 - 4.8 g/dL   Globulin, Total 2.7 1.5 - 4.5 g/dL   Albumin/Globulin Ratio 1.6 1.2 - 2.2   Bilirubin Total 1.0 0.0 - 1.2 mg/dL   Alkaline Phosphatase 98 44 - 121 IU/L   AST 52 (H) 0 - 40 IU/L   ALT 68 (H) 0 - 44 IU/L  Lipid Panel w/o Chol/HDL Ratio  Result Value Ref Range   Cholesterol, Total 130 100 - 199 mg/dL   Triglycerides 115 0 - 149 mg/dL   HDL 33 (L) >39 mg/dL   VLDL Cholesterol Cal 21 5 - 40 mg/dL   LDL Chol Calc (NIH) 76 0 - 99 mg/dL  TSH  Result Value Ref Range   TSH 0.943 0.450 - 4.500 uIU/mL  PSA  Result Value Ref Range   Prostate Specific Ag, Serum 1.0 0.0 - 4.0 ng/mL  Microalbumin, Urine Waived  Result Value Ref Range   Microalb, Ur Waived 30 (H) 0 - 19 mg/L   Creatinine, Urine Waived 200 10 - 300 mg/dL   Microalb/Creat Ratio <30 <30 mg/g  Magnesium  Result Value Ref Range   Magnesium 2.1 1.6 - 2.3 mg/dL      Assessment & Plan:   Problem List Items Addressed This Visit       Cardiovascular and Mediastinum   Chronic systolic heart failure ([place])    Since STEMI 07/28/20.  At this time continue current medication regimen and collaboration with cardiology, but add on Lasix 10 MG daily for a couple weeks then will recheck  if ongoing need + check labs today.  Trace edema to legs.  Recommend: - Reminded to call for an overnight weight gain of >2 pounds or a weekly weight gain of >5 pounds - not adding salt to food and read food labels. Reviewed the importance of keeping daily sodium intake to '2000mg'$  daily.  Highly recommend he cut back on sodium intake as suspect this is causing some edema. - Avoid Ibuprofen products.      Relevant Medications   furosemide (LASIX) 20 MG tablet   Essential hypertension    Chronic, ongoing with BP at goal today.  Recommend he monitor BP at home three mornings a week and document for provider, educated him on this.  Continue diet and exercise focus, DASH diet.  Labs: CBC, CMP, TSH.  Continue current medication regimen and collaboration with cardiology.      Relevant Medications   furosemide (LASIX) 20 MG tablet   Senile purpura (HCC)    As evidenced by bruising upper  extremities with ASA use.  Recommend gentle skin care and monitor for wounds, if present immediately notify provider.      Relevant Medications   furosemide (LASIX) 20 MG tablet   Unstable angina ([place])    Ongoing with STEMI 07/28/20, at this time will continue collaboration with cardiology and medication regimen as prescribed by them.      Relevant Medications   furosemide (LASIX) 20 MG tablet     Respiratory   COPD (chronic obstructive pulmonary disease) (HCC) - Primary    Chronic, stable.  Spirometry FEV1/FVC 90% and FEV1 76% in August 2020.   Recommend continued cessation.  Continue current medication regimen, discussed possible change to Anoro vs Symbicort but it is too costly at this time, consider for future and consider CCM referral in future.  He refuses Lung CA Screening CT at this time due to current medical bills, recommended he obtain this.  Return in 6 months with repeat spirometry.        Digestive   Chronic active hepatitis ([place])    Ongoing.  Have highly recommended he schedule follow-up with  GI to initiate treatment, discussed at length with him.  New referral to GI placed.      Relevant Orders   Ambulatory referral to Gastroenterology     Other   Edema of both legs    ?related to salt intake with his underlying HF.  Have recommended he cut back on this.  Start low dose of Lasix daily and check labs today.  May need to repeat echo and get back into cardiology sooner.  Also recommend he scheduled follow-up with GI for his Hep C.  Return in 2 weeks.      Relevant Orders   B Nat Peptide   CBC with Differential/Platelet   Comprehensive metabolic panel   TSH     Follow up plan: Return in about 2 weeks (around 09/19/2021) for LEG EDEMA.

## 2021-09-05 NOTE — Assessment & Plan Note (Signed)
Ongoing.  Have highly recommended he schedule follow-up with GI to initiate treatment, discussed at length with him.  New referral to GI placed.

## 2021-09-05 NOTE — Assessment & Plan Note (Signed)
Since STEMI 07/28/20.  At this time continue current medication regimen and collaboration with cardiology, but add on Lasix 10 MG daily for a couple weeks then will recheck if ongoing need + check labs today.  Trace edema to legs.  Recommend: - Reminded to call for an overnight weight gain of >2 pounds or a weekly weight gain of >5 pounds - not adding salt to food and read food labels. Reviewed the importance of keeping daily sodium intake to 2000mg  daily.  Highly recommend he cut back on sodium intake as suspect this is causing some edema. - Avoid Ibuprofen products.

## 2021-09-05 NOTE — Assessment & Plan Note (Signed)
As evidenced by bruising upper extremities with ASA use.  Recommend gentle skin care and monitor for wounds, if present immediately notify provider. 

## 2021-09-05 NOTE — Patient Instructions (Signed)
FOLLOW UP WITH TINA AT HEART FAILURE CLINIC + HAVE PLACED REFERRAL TO GET YOU BACK INTO GI PROVIDER. Heart Failure Action Plan A heart failure action plan helps you understand what to do when you have symptoms of heart failure. Your action plan is a color-coded plan that lists the symptoms to watch for and indicates what actions to take. If you have symptoms in the red zone, you need medical care right away. If you have symptoms in the yellow zone, you are having problems. If you have symptoms in the green zone, you are doing well. Follow the plan that was created by you and your health care provider. Review your plan each time you visit your health care provider. Red zone These signs and symptoms mean you should get medical help right away: You have trouble breathing when resting. You have a dry cough that is getting worse. You have swelling or pain in your legs or abdomen that is getting worse. You suddenly gain more than 2-3 lb (0.9-1.4 kg) in 24 hours, or more than 5 lb (2.3 kg) in a week. This amount may be more or less depending on your condition. You have trouble staying awake or you feel confused. You have chest pain. You do not have an appetite. You pass out. You have worsening sadness or depression. If you have any of these symptoms, call your local emergency services (911 in the [place] right away. Do not drive yourself to the hospital. Yellow zone These signs and symptoms mean your condition may be getting worse and you should make some changes: You have trouble breathing when you are active, or you need to sleep with your head raised on extra pillows to help you breathe. You have swelling in your legs or abdomen. You gain 2-3 lb (0.9-1.4 kg) in 24 hours, or 5 lb (2.3 kg) in a week. This amount may be more or less depending on your condition. You get tired easily. You have trouble sleeping. You have a dry cough. If you have any of these symptoms: Contact your health care  provider within the next day. Your health care provider may adjust your medicines. Green zone These signs mean you are doing well and can continue what you are doing: You do not have shortness of breath. You have very little swelling or no new swelling. Your weight is stable (no gain or loss). You have a normal activity level. You do not have chest pain or any other new symptoms. Follow these instructions at home: Take over-the-counter and prescription medicines only as told by your health care provider. Weigh yourself daily. Your target weight is __________ lb (__________ kg). Call your health care provider if you gain more than __________ lb (__________ kg) in 24 hours, or more than __________ lb (__________ kg) in a week. Health care provider name: _____________________________________________________ Health care provider phone number: _____________________________________________________ Eat a heart-healthy diet. Work with a diet and nutrition specialist (dietitian) to create an eating plan that is best for you. Keep all follow-up visits. This is important. Where to find more information American Heart Association: www.heart.org Summary A heart failure action plan helps you understand what to do when you have symptoms of heart failure. Follow the action plan that was created by you and your health care provider. Get help right away if you have any symptoms in the red zone. This information is not intended to replace advice given to you by your health care provider. Make sure you discuss any questions you  have with your health care provider. Document Revised: 02/15/2020 Document Reviewed: 02/15/2020 Elsevier Patient Education  2022 [employer].

## 2021-09-05 NOTE — Assessment & Plan Note (Addendum)
?  related to salt intake with his underlying HF.  Have recommended he cut back on this.  Start low dose of Lasix daily and check labs today.  May need to repeat echo and get back into cardiology sooner.  Also recommend he scheduled follow-up with GI for his Hep C.  Return in 2 weeks.

## 2021-09-05 NOTE — Assessment & Plan Note (Signed)
Ongoing with STEMI 07/28/20, at this time will continue collaboration with cardiology and medication regimen as prescribed by them.

## 2021-09-06 ENCOUNTER — Telehealth: Payer: Self-pay

## 2021-09-06 LAB — COMPREHENSIVE METABOLIC PANEL
ALT: 41 IU/L (ref 0–44)
AST: 35 IU/L (ref 0–40)
Albumin/Globulin Ratio: 1.4 (ref 1.2–2.2)
Albumin: 4.3 g/dL (ref 3.8–4.8)
Alkaline Phosphatase: 114 IU/L (ref 44–121)
BUN/Creatinine Ratio: 10 (ref 10–24)
BUN: 9 mg/dL (ref 8–27)
Bilirubin Total: 1.2 mg/dL (ref 0.0–1.2)
CO2: 20 mmol/L (ref 20–29)
Calcium: 9.6 mg/dL (ref 8.6–10.2)
Chloride: 104 mmol/L (ref 96–106)
Creatinine, Ser: 0.87 mg/dL (ref 0.76–1.27)
Globulin, Total: 3.1 g/dL (ref 1.5–4.5)
Glucose: 67 mg/dL — ABNORMAL LOW (ref 70–99)
Potassium: 4.3 mmol/L (ref 3.5–5.2)
Sodium: 142 mmol/L (ref 134–144)
Total Protein: 7.4 g/dL (ref 6.0–8.5)
eGFR: 94 mL/min/{1.73_m2} (ref >59)

## 2021-09-06 LAB — CBC WITH DIFFERENTIAL/PLATELET
Basophils Absolute: 0 10*3/uL (ref 0.0–0.2)
Basos: 0 %
EOS (ABSOLUTE): 0.3 10*3/uL (ref 0.0–0.4)
Eos: 3 %
Hematocrit: 47.9 % (ref 37.5–51.0)
Hemoglobin: 16.3 g/dL (ref 13.0–17.7)
Immature Grans (Abs): 0 10*3/uL (ref 0.0–0.1)
Immature Granulocytes: 0 %
Lymphocytes Absolute: 3 10*3/uL (ref 0.7–3.1)
Lymphs: 33 %
MCH: 32.3 pg (ref 26.6–33.0)
MCHC: 34 g/dL (ref 31.5–35.7)
MCV: 95 fL (ref 79–97)
Monocytes Absolute: 0.9 10*3/uL (ref 0.1–0.9)
Monocytes: 10 %
Neutrophils Absolute: 4.8 10*3/uL (ref 1.4–7.0)
Neutrophils: 54 %
Platelets: 282 10*3/uL (ref 150–450)
RBC: 5.05 x10E6/uL (ref 4.14–5.80)
RDW: 12.3 % (ref 11.6–15.4)
WBC: 9.1 10*3/uL (ref 3.4–10.8)

## 2021-09-06 LAB — TSH: TSH: 0.755 u[IU]/mL (ref 0.450–4.500)

## 2021-09-06 LAB — BRAIN NATRIURETIC PEPTIDE: BNP: 34.4 pg/mL (ref 0.0–100.0)

## 2021-09-06 NOTE — Progress Notes (Signed)
Contacted via MyChart   Good afternoon Andrew Potts, waiting on one more lab to look at heart (BNP), but current labs are all stable with no concerns correlated to your edema.  This is good news.  Kidney function, creatinine and eGFR, remains normal, as is liver function, AST and ALT.  Any questions on this? Keep being amazing!!  Thank you for allowing me to participate in your care.  I appreciate you. Kindest regards, Bridie Colquhoun

## 2021-09-06 NOTE — Progress Notes (Signed)
Contacted via MyChart   BNP -- heart lab is normal.

## 2021-09-06 NOTE — Telephone Encounter (Signed)
Scheduled for 11/16/2021 °

## 2021-09-24 NOTE — Patient Instructions (Signed)
Heart Failure Action Plan °A heart failure action plan helps you understand what to do when you have symptoms of heart failure. Your action plan is a color-coded plan that lists the symptoms to watch for and indicates what actions to take. °If you have symptoms in the red zone, you need medical care right away. °If you have symptoms in the yellow zone, you are having problems. °If you have symptoms in the green zone, you are doing well. °Follow the plan that was created by you and your health care provider. Review your plan each time you visit your health care provider. °Red zone °These signs and symptoms mean you should get medical help right away: °You have trouble breathing when resting. °You have a dry cough that is getting worse. °You have swelling or pain in your legs or abdomen that is getting worse. °You suddenly gain more than 2-3 lb (0.9-1.4 kg) in 24 hours, or more than 5 lb (2.3 kg) in a week. This amount may be more or less depending on your condition. °You have trouble staying awake or you feel confused. °You have chest pain. °You do not have an appetite. °You pass out. °You have worsening sadness or depression. °If you have any of these symptoms, call your local emergency services (911 in the [place] right away. Do not drive yourself to the hospital. °Yellow zone °These signs and symptoms mean your condition may be getting worse and you should make some changes: °You have trouble breathing when you are active, or you need to sleep with your head raised on extra pillows to help you breathe. °You have swelling in your legs or abdomen. °You gain 2-3 lb (0.9-1.4 kg) in 24 hours, or 5 lb (2.3 kg) in a week. This amount may be more or less depending on your condition. °You get tired easily. °You have trouble sleeping. °You have a dry cough. °If you have any of these symptoms: °Contact your health care provider within the next day. °Your health care provider may adjust your medicines. °Green zone °These signs  mean you are doing well and can continue what you are doing: °You do not have shortness of breath. °You have very little swelling or no new swelling. °Your weight is stable (no gain or loss). °You have a normal activity level. °You do not have chest pain or any other new symptoms. °Follow these instructions at home: °Take over-the-counter and prescription medicines only as told by your health care provider. °Weigh yourself daily. Your target weight is __________ lb (__________ kg). °Call your health care provider if you gain more than __________ lb (__________ kg) in 24 hours, or more than __________ lb (__________ kg) in a week. °Health care provider name: _____________________________________________________ °Health care provider phone number: _____________________________________________________ °Eat a heart-healthy diet. Work with a diet and nutrition specialist (dietitian) to create an eating plan that is best for you. °Keep all follow-up visits. This is important. °Where to find more information °American Heart Association: www.heart.org °Summary °A heart failure action plan helps you understand what to do when you have symptoms of heart failure. °Follow the action plan that was created by you and your health care provider. °Get help right away if you have any symptoms in the red zone. °This information is not intended to replace advice given to you by your health care provider. Make sure you discuss any questions you have with your health care provider. °Document Revised: 02/15/2020 Document Reviewed: 02/15/2020 °Elsevier Patient Education © 2022 Elsevier Inc. ° °

## 2021-09-26 ENCOUNTER — Other Ambulatory Visit: Payer: Self-pay

## 2021-09-26 ENCOUNTER — Ambulatory Visit (INDEPENDENT_AMBULATORY_CARE_PROVIDER_SITE_OTHER): Payer: Medicare HMO | Admitting: Nurse Practitioner

## 2021-09-26 ENCOUNTER — Encounter: Payer: Self-pay | Admitting: Nurse Practitioner

## 2021-09-26 VITALS — BP 131/75 | HR 69 | Temp 97.5°F | Ht 67.0 in | Wt 177.2 lb

## 2021-09-26 DIAGNOSIS — I700 Atherosclerosis of aorta: Secondary | ICD-10-CM

## 2021-09-26 DIAGNOSIS — R600 Localized edema: Secondary | ICD-10-CM

## 2021-09-26 DIAGNOSIS — I5022 Chronic systolic (congestive) heart failure: Secondary | ICD-10-CM

## 2021-09-26 DIAGNOSIS — I10 Essential (primary) hypertension: Secondary | ICD-10-CM | POA: Diagnosis not present

## 2021-09-26 MED ORDER — FUROSEMIDE 20 MG PO TABS: 10.0000 mg | ORAL_TABLET | Freq: Every day | ORAL | 4 refills | Status: DC

## 2021-09-26 NOTE — Assessment & Plan Note (Signed)
Chronic, ongoing with BP at goal today.  Recommend he monitor BP at home three mornings a week and document for provider, educated him on this.  Continue diet and exercise focus, DASH diet.  Labs: BMP.  Continue current medication regimen and collaboration with cardiology. ?

## 2021-09-26 NOTE — Assessment & Plan Note (Signed)
Noted on past imaging.  Recommend he continue daily statin and ASA for prevention + complete cessation of smoking. 

## 2021-09-26 NOTE — Assessment & Plan Note (Signed)
Since STEMI 07/28/20.  At this time continue current medication regimen and collaboration with cardiology, keep Lasix on board for now as edema is improved and he is tolerating.  Recommend: ?- Reminded to call for an overnight weight gain of >2 pounds or a weekly weight gain of >5 pounds ?- not adding salt to food and read food labels. Reviewed the importance of keeping daily sodium intake to 2000mg  daily.  Highly recommend he cut back on sodium intake as suspect this is causing some edema. ?- Avoid Ibuprofen products. ?

## 2021-09-26 NOTE — Assessment & Plan Note (Signed)
Improved, will maintain Lasix at low dose at this time and check BMP today. ?

## 2021-09-26 NOTE — Progress Notes (Signed)
?  ? ?BP 131/75   Pulse 69   Temp (!) 97.5 ?F (36.4 ?C) (Oral)   Ht $R'5\' 7"'eu$  (1.702 m)   Wt 177 lb 3.2 oz (80.4 kg)   SpO2 97%   BMI 27.75 kg/m?   ? ?Subjective:  ? ? Patient ID: Andrew Potts, male    DOB: 09-26-1950, 71 y.o.   MRN: [id number] ? ?HPI: ?Andrew Potts is a 71 y.o. male ? ?Chief Complaint  ?Patient presents with  ? Leg Swelling  ?  Patient is here to follow up on Edema. Patient states it does not seems to get any worse but he does notice it has gotten better. Patient states he still notices a little bit, but not as much.   ? ?HYPERTENSION/HF/HLD ?Followed by cardiology with last visit 06/01/21.  Saw Tina at the HF clinic on 02/07/21 last -- repeat echo on 01/24/21 noted EF 55 to 60%. History of STEMI 07/28/20.  Started low dose of Lasix at visit on 09/05/21 for mild leg edema.   ? ?  ?Aortic atherosclerosis noted on imaging 09/20/20. ?Hypertension status: stable  ?Satisfied with current treatment? yes ?Duration of hypertension: chronic ?BP monitoring frequency:  not checking ?BP range:  ?BP medication side effects:  no ?Medication compliance: good compliance ?Previous BP meds: ?Aspirin: yes ?Recurrent headaches: no ?Visual changes: no ?Palpitations: no ?Dyspnea: none ?Chest pain: no ?Lower extremity edema: improved ?Dizzy/lightheaded:  none ? ?Relevant past medical, surgical, family and social history reviewed and updated as indicated. Interim medical history since our last visit reviewed. ?Allergies and medications reviewed and updated. ? ?Review of Systems  ?Constitutional:  Negative for activity change, diaphoresis, fatigue and fever.  ?Respiratory:  Negative for cough, chest tightness, shortness of breath and wheezing.   ?Cardiovascular:  Negative for chest pain, palpitations and leg swelling.  ?Gastrointestinal: Negative.   ?Neurological: Negative.   ?Psychiatric/Behavioral: Negative.    ? ?Per HPI unless specifically indicated above ? ?   ?Objective:  ?  ?BP 131/75   Pulse 69   Temp (!) 97.5  ?F (36.4 ?C) (Oral)   Ht $R'5\' 7"'Jq$  (1.702 m)   Wt 177 lb 3.2 oz (80.4 kg)   SpO2 97%   BMI 27.75 kg/m?   ?Wt Readings from Last 3 Encounters:  ?09/26/21 177 lb 3.2 oz (80.4 kg)  ?09/05/21 175 lb 6.4 oz (79.6 kg)  ?05/10/21 171 lb 12.8 oz (77.9 kg)  ?  ?Physical Exam ?Vitals and nursing note reviewed.  ?Constitutional:   ?   General: He is awake. He is not in acute distress. ?   Appearance: He is well-developed and well-groomed. He is not ill-appearing.  ?HENT:  ?   Head: Normocephalic and atraumatic.  ?   Right Ear: Hearing normal. No drainage.  ?   Left Ear: Hearing normal. No drainage.  ?Eyes:  ?   General: Lids are normal.     ?   Right eye: No discharge.     ?   Left eye: No discharge.  ?   Conjunctiva/sclera: Conjunctivae normal.  ?   Pupils: Pupils are equal, round, and reactive to light.  ?Neck:  ?   Thyroid: No thyromegaly.  ?   Vascular: No carotid bruit.  ?   Trachea: Trachea normal.  ?Cardiovascular:  ?   Rate and Rhythm: Normal rate and regular rhythm.  ?   Heart sounds: Normal heart sounds, S1 normal and S2 normal. No murmur heard. ?  No gallop.  ?Pulmonary:  ?  Effort: Pulmonary effort is normal.  ?   Breath sounds: Normal breath sounds.  ?Abdominal:  ?   General: Bowel sounds are normal. There is no distension.  ?   Palpations: Abdomen is soft. There is no hepatomegaly.  ?   Tenderness: There is no abdominal tenderness.  ?   Comments:  ?  ?Musculoskeletal:     ?   General: Normal range of motion.  ?   Cervical back: Normal range of motion and neck supple.  ?   Right lower leg: No edema.  ?   Left lower leg: No edema.  ?Skin: ?   General: Skin is warm and dry.  ?   Capillary Refill: Capillary refill takes less than 2 seconds.  ?   Comments: Scattered pale purple bruises noted to bilateral upper extremities.  ?Neurological:  ?   Mental Status: He is alert and oriented to person, place, and time.  ?   Deep Tendon Reflexes: Reflexes are normal and symmetric.  ?Psychiatric:     ?   Attention and  Perception: Attention normal.     ?   Mood and Affect: Mood normal.     ?   Speech: Speech normal.     ?   Behavior: Behavior normal. Behavior is cooperative.     ?   Thought Content: Thought content normal.  ? ? ?Results for orders placed or performed in visit on 09/05/21  ?B Nat Peptide  ?Result Value Ref Range  ? BNP 34.4 0.0 - 100.0 pg/mL  ?CBC with Differential/Platelet  ?Result Value Ref Range  ? WBC 9.1 3.4 - 10.8 x10E3/uL  ? RBC 5.05 4.14 - 5.80 x10E6/uL  ? Hemoglobin 16.3 13.0 - 17.7 g/dL  ? Hematocrit 47.9 37.5 - 51.0 %  ? MCV 95 79 - 97 fL  ? MCH 32.3 26.6 - 33.0 pg  ? MCHC 34.0 31.5 - 35.7 g/dL  ? RDW 12.3 11.6 - 15.4 %  ? Platelets 282 150 - 450 x10E3/uL  ? Neutrophils 54 Not Estab. %  ? Lymphs 33 Not Estab. %  ? Monocytes 10 Not Estab. %  ? Eos 3 Not Estab. %  ? Basos 0 Not Estab. %  ? Neutrophils Absolute 4.8 1.4 - 7.0 x10E3/uL  ? Lymphocytes Absolute 3.0 0.7 - 3.1 x10E3/uL  ? Monocytes Absolute 0.9 0.1 - 0.9 x10E3/uL  ? EOS (ABSOLUTE) 0.3 0.0 - 0.4 x10E3/uL  ? Basophils Absolute 0.0 0.0 - 0.2 x10E3/uL  ? Immature Granulocytes 0 Not Estab. %  ? Immature Grans (Abs) 0.0 0.0 - 0.1 x10E3/uL  ?Comprehensive metabolic panel  ?Result Value Ref Range  ? Glucose 67 (L) 70 - 99 mg/dL  ? BUN 9 8 - 27 mg/dL  ? Creatinine, Ser 0.87 0.76 - 1.27 mg/dL  ? eGFR 94 >59 mL/min/1.73  ? BUN/Creatinine Ratio 10 10 - 24  ? Sodium 142 134 - 144 mmol/L  ? Potassium 4.3 3.5 - 5.2 mmol/L  ? Chloride 104 96 - 106 mmol/L  ? CO2 20 20 - 29 mmol/L  ? Calcium 9.6 8.6 - 10.2 mg/dL  ? Total Protein 7.4 6.0 - 8.5 g/dL  ? Albumin 4.3 3.8 - 4.8 g/dL  ? Globulin, Total 3.1 1.5 - 4.5 g/dL  ? Albumin/Globulin Ratio 1.4 1.2 - 2.2  ? Bilirubin Total 1.2 0.0 - 1.2 mg/dL  ? Alkaline Phosphatase 114 44 - 121 IU/L  ? AST 35 0 - 40 IU/L  ? ALT 41 0 - 44 IU/L  ?  TSH  ?Result Value Ref Range  ? TSH 0.755 0.450 - 4.500 uIU/mL  ? ?   ?Assessment & Plan:  ? ?Problem List Items Addressed This Visit   ? ?  ? Cardiovascular and Mediastinum  ? Aortic  atherosclerosis ([place])  ?  Noted on past imaging.  Recommend he continue daily statin and ASA for prevention + complete cessation of smoking. ?  ?  ? Relevant Medications  ? furosemide (LASIX) 20 MG tablet  ? Chronic systolic heart failure (HCC) - Primary  ?  Since STEMI 07/28/20.  At this time continue current medication regimen and collaboration with cardiology, keep Lasix on board for now as edema is improved and he is tolerating.  Recommend: ?- Reminded to call for an overnight weight gain of >2 pounds or a weekly weight gain of >5 pounds ?- not adding salt to food and read food labels. Reviewed the importance of keeping daily sodium intake to '2000mg'$  daily.  Highly recommend he cut back on sodium intake as suspect this is causing some edema. ?- Avoid Ibuprofen products. ?  ?  ? Relevant Medications  ? furosemide (LASIX) 20 MG tablet  ? Other Relevant Orders  ? Basic metabolic panel  ? Essential hypertension  ?  Chronic, ongoing with BP at goal today.  Recommend he monitor BP at home three mornings a week and document for provider, educated him on this.  Continue diet and exercise focus, DASH diet.  Labs: BMP.  Continue current medication regimen and collaboration with cardiology. ?  ?  ? Relevant Medications  ? furosemide (LASIX) 20 MG tablet  ? Other Relevant Orders  ? Basic metabolic panel  ?  ? Other  ? Edema of both legs  ?  Improved, will maintain Lasix at low dose at this time and check BMP today. ?  ?  ? Relevant Orders  ? Basic metabolic panel  ?  ? ?Follow up plan: ?Return in about 7 months (around 05/07/2022) for Annual physical. ? ? ? ? ? ?

## 2021-09-27 LAB — BASIC METABOLIC PANEL
BUN/Creatinine Ratio: 11 (ref 10–24)
BUN: 9 mg/dL (ref 8–27)
CO2: 27 mmol/L (ref 20–29)
Calcium: 9.4 mg/dL (ref 8.6–10.2)
Chloride: 102 mmol/L (ref 96–106)
Creatinine, Ser: 0.85 mg/dL (ref 0.76–1.27)
Glucose: 77 mg/dL (ref 70–99)
Potassium: 4.2 mmol/L (ref 3.5–5.2)
Sodium: 142 mmol/L (ref 134–144)
eGFR: 95 mL/min/{1.73_m2} (ref >59)

## 2021-09-27 NOTE — Progress Notes (Signed)
Contacted via MyChart ? ? ?Good evening Andrew Potts, labs have returned and remain stable.  Continue current medications.  Any questions? ?Keep being awesome!!  Thank you for allowing me to participate in your care.  I appreciate you. ?Kindest regards, ?Rishawn Walck ?

## 2021-10-16 ENCOUNTER — Ambulatory Visit (INDEPENDENT_AMBULATORY_CARE_PROVIDER_SITE_OTHER): Payer: Medicare HMO

## 2021-10-16 DIAGNOSIS — I10 Essential (primary) hypertension: Secondary | ICD-10-CM

## 2021-10-16 DIAGNOSIS — E782 Mixed hyperlipidemia: Secondary | ICD-10-CM

## 2021-10-16 DIAGNOSIS — J449 Chronic obstructive pulmonary disease, unspecified: Secondary | ICD-10-CM

## 2021-10-16 NOTE — Progress Notes (Signed)
? ?Chronic Care Management ?Pharmacy Note ? ?10/16/2021 ?Name:  Andrew Potts MRN:  [id number] DOB:  [birth year] ? ?Summary: ?Symbicort PAP approved as of 2023, may be receiving extra help moving forward ?Lasix - 10 mg daily, helping with swelling. Reading labels, checking daily weights. Plans to buy home BP cuff. ?Reports upcoming f/u w/ Dr Vicente Males related to Hep C - early may 2023 ? ?Subjective: ?Andrew Potts is an 71 y.o. year old male who is a primary patient of [name], Henrine Screws T, NP.  The CCM team was consulted for assistance with disease management and care coordination needs.   ? ?Engaged with patient by telephone for follow up visit in response to provider referral for pharmacy case management and/or care coordination services.  ? ?Consent to Services:  ?The patient was given information about Chronic Care Management services, agreed to services, and gave verbal consent prior to initiation of services.  Please see initial visit note for detailed documentation.  ? ?Patient Care Team: ?Venita Lick, NP as PCP - General (Nurse Practitioner) ?Madelin Rear, [facility] (Pharmacist) ? ?Hospital visits: ?None in previous 6 months ? ?Objective: ? ?Lab Results  ?Component Value Date  ? CREATININE 0.85 09/26/2021  ? CREATININE 0.87 09/05/2021  ? CREATININE 0.93 05/10/2021  ? ? ?Lab Results  ?Component Value Date  ? HGBA1C 5.2 07/29/2020  ? ?Last diabetic Eye exam: No results found for: HMDIABEYEEXA  ?Last diabetic Foot exam: No results found for: HMDIABFOOTEX  ? ?   ?Component Value Date/Time  ? CHOL 130 05/10/2021 1156  ? CHOL 105 09/21/2020 0437  ? CHOL 117 08/10/2020 1531  ? CHOL 161 07/29/2020 0651  ? CHOL 183 05/03/2020 1338  ? TRIG 115 05/10/2021 1156  ? TRIG 56 09/21/2020 0437  ? TRIG 66 08/10/2020 1531  ? HDL 33 (L) 05/10/2021 1156  ? HDL 40 (L) 09/21/2020 0437  ? HDL 42 08/10/2020 1531  ? HDL 40 (L) 07/29/2020 0651  ? HDL 40 05/03/2020 1338  ? CHOLHDL 2.6 09/21/2020 0437  ? VLDL 11 09/21/2020 0437  ? [place] 76  05/10/2021 1156  ? [place] 54 09/21/2020 0437  ? [place] 61 08/10/2020 1531  ? [place] 106 (H) 07/29/2020 5009  ? [place] 122 (H) 05/03/2020 1338  ? ? ? ?  Latest Ref Rng & Units 09/05/2021  ?  1:40 PM 05/10/2021  ? 11:56 AM 09/28/2020  ? 10:46 AM  ?Hepatic Function  ?Total Protein 6.0 - 8.5 g/dL 7.4   7.0   7.0    ?Albumin 3.8 - 4.8 g/dL 4.3   4.3   4.0    ?AST 0 - 40 IU/L 35   52   48    ?ALT 0 - 44 IU/L 41   68   53    ?Alk Phosphatase 44 - 121 IU/L 114   98   81    ?Total Bilirubin 0.0 - 1.2 mg/dL 1.2   1.0   0.8    ? ? ?Lab Results  ?Component Value Date/Time  ? TSH 0.755 09/05/2021 01:40 PM  ? TSH 0.943 05/10/2021 11:56 AM  ? ? ? ?  Latest Ref Rng & Units 09/05/2021  ?  1:40 PM 05/10/2021  ? 11:56 AM 09/28/2020  ? 10:46 AM  ?CBC  ?WBC 3.4 - 10.8 x10E3/uL 9.1   7.2   9.8    ?Hemoglobin 13.0 - 17.7 g/dL 16.3   15.5   13.9    ?Hematocrit 37.5 - 51.0 % 47.9  45.4   41.3    ?Platelets 150 - 450 x10E3/uL 282   240   333    ? ? ?No results found for: VD25OH ? ?Clinical ASCVD:  ?The ASCVD Risk score (Arnett DK, et al., 2019) failed to calculate for the following reasons: ?  The patient has a prior MI or stroke diagnosis   ?Social History  ? ?Tobacco Use  ?Smoking Status Former  ? Packs/day: 0.50  ? Years: 47.00  ? Pack years: 23.50  ? Types: Cigarettes  ? Quit date: 07/28/2020  ? Years since quitting: 1.2  ?Smokeless Tobacco Never  ?Tobacco Comments  ? 08/24/20 given quit smoking paperwork and encouraged continued cessation  ? ?BP Readings from Last 3 Encounters:  ?09/26/21 131/75  ?09/05/21 111/70  ?05/10/21 128/70  ? ?Pulse Readings from Last 3 Encounters:  ?09/26/21 69  ?09/05/21 69  ?05/10/21 60  ? ?Wt Readings from Last 3 Encounters:  ?09/26/21 177 lb 3.2 oz (80.4 kg)  ?09/05/21 175 lb 6.4 oz (79.6 kg)  ?05/10/21 171 lb 12.8 oz (77.9 kg)  ? ?BMI Readings from Last 3 Encounters:  ?09/26/21 27.75 kg/m?  ?09/05/21 27.47 kg/m?  ?05/10/21 26.91 kg/m?  ? ? ?Assessment: Review of patient past medical history, allergies,  medications, health status, including review of consultants reports, laboratory and other test data, was performed as part of comprehensive evaluation and provision of chronic care management services.  ? ?SDOH:  (Social Determinants of Health) assessments and interventions performed: Yes ? ? ?Hinds ? ?Allergies  ?Allergen Reactions  ? Levaquin [Levofloxacin] Swelling  ? Codeine Itching  ?  Abdominal pain, nose itching  ? Codone [Hydrocodone] Other (See Comments)  ?  Abdominal pain, felt loopy  ? ? ?Medications Reviewed Today   ? ? Reviewed by Madelin Rear, [facility] (Pharmacist) on 10/16/21 at [phone]  Med List Status: <None>  ? ?Medication Order Taking? Sig Documenting Provider Last Dose Status Informant  ?albuterol (VENTOLIN HFA) 108 (90 Base) MCG/ACT inhaler [id number] Yes Inhale 2 puffs into the lungs every 6 (six) hours as needed for wheezing or shortness of breath. Marnee Guarneri T, NP  Active   ?aspirin EC 81 MG EC tablet [id number] Yes Take 1 tablet (81 mg total) by mouth daily. Swallow whole. Shawna Clamp, MD  Active Self  ?atorvastatin (LIPITOR) 80 MG tablet [id number] Yes Take 1 tablet (80 mg total) by mouth daily. Venita Lick, NP  Active   ?Ensure (ENSURE) [id number] Yes Take 237 mLs by mouth daily. [provider]  Active Self  ?furosemide (LASIX) 20 MG tablet [id number] Yes Take 0.5 tablets (10 mg total) by mouth daily. Marnee Guarneri T, NP  Active   ?lisinopril (ZESTRIL) 5 MG tablet [id number] Yes Take 1 tablet (5 mg total) by mouth daily. Marnee Guarneri T, NP  Active Self  ?metoprolol tartrate (LOPRESSOR) 25 MG tablet [id number] Yes Take 0.5 tablets (12.5 mg total) by mouth 2 (two) times daily. Marnee Guarneri T, NP  Active   ?pantoprazole (PROTONIX) 40 MG tablet [id number] Yes Take 1 tablet (40 mg total) by mouth daily. Ronny Bacon, MD  Active   ?sildenafil (REVATIO) 20 MG tablet [id number] Yes Take 1 tablet (20 mg total) by mouth daily as needed (erectile dysfunction). Venita Lick, NP  Active   ?SYMBICORT 160-4.5 MCG/ACT inhaler [id number] Yes Inhale 2 puffs into the lungs 2 (two) times daily. Venita Lick, NP  Active   ? ?  ?  ? ?  ? ? ?Patient  Active Problem List  ? Diagnosis Date Noted  ? Gastroesophageal reflux disease without esophagitis 05/10/2021  ? Chronic systolic heart failure ([place]) 12/29/2020  ? S/P laparoscopic cholecystectomy 12/15/2020  ? Aortic atherosclerosis ([place]) 10/11/2020  ? Senile purpura ([place]) 09/28/2020  ? Unstable angina ([place]) 09/20/2020  ? CAD, multiple vessel 09/20/2020  ? History of ST elevation myocardial infarction (STEMI) 07/28/2020  ? HLD (hyperlipidemia) 07/28/2020  ? Chronic active hepatitis ([place]) 03/11/2019  ? Erectile dysfunction 02/04/2019  ? COPD (chronic obstructive pulmonary disease) ([place]) 02/04/2019  ? Nicotine dependence, cigarettes, w unsp disorders 02/04/2019  ? Essential hypertension 02/04/2019  ? ? ?Immunization History  ?Administered Date(s) Administered  ? Influenza, High Dose Seasonal PF 03/21/2019, 04/07/2020  ? Influenza,inj,Quad PF,6+ Mos 04/07/2018  ? Influenza-Unspecified 04/07/2021  ? PFIZER(Purple Top)SARS-COV-2 Vaccination 10/07/2019, 11/04/2019, 05/01/2020, 11/08/2020  ? Pneumococcal Conjugate-13 03/21/2019  ? Pneumococcal Polysaccharide-23 05/10/2021  ? ?Conditions to be addressed/monitored: ?HLD COPD HF HTN ED ? ?Care Plan : ccm pharmacy care plan  ?Updates made by Madelin Rear, [facility] since 10/16/2021 12:00 AM  ?  ? ?Problem: hld htn copd chf   ?Priority: High  ?  ? ?Long-Range Goal: disease management   ?Start Date: 05/15/2021  ?Expected End Date: 05/15/2022  ?Recent Progress: On track  ?Priority: High  ?Note:   ?Current Barriers:  ?Unable to independently afford treatment regimen ? ?Pharmacist Clinical Goal(s):  ?Patient will verbalize ability to afford treatment regimen ?contact provider office for questions/concerns as evidenced notation of same in electronic health record ?pursue patient assistance as appropriate through  collaboration with PharmD and provider.  ? ?Interventions: ?1:1 collaboration with Venita Lick, NP regarding development and update of comprehensive plan of care as evidenced by provider attestation and

## 2021-10-16 NOTE — Patient Instructions (Signed)
Mr. Nivison, ? ?Thank you for talking with me today. I have included our care plan/goals in the following pages.  ? ?Please review and call me at [phone] with any questions. ? ?Thanks! ?Edison Nasuti  ? ?Madelin Rear, PharmD ?Clinical Pharmacist  ?[phone] ? ?Care Plan : ccm pharmacy care plan  ?Updates made by Madelin Rear, [facility] since 10/16/2021 12:00 AM  ?  ? ?Problem: hld htn copd chf   ?Priority: High  ?  ? ?Long-Range Goal: disease management   ?Start Date: 05/15/2021  ?Expected End Date: 05/15/2022  ?Recent Progress: On track  ?Priority: High  ?Note:   ?Current Barriers:  ?Unable to independently afford treatment regimen ? ?Pharmacist Clinical Goal(s):  ?Patient will verbalize ability to afford treatment regimen ?contact provider office for questions/concerns as evidenced notation of same in electronic health record ?pursue patient assistance as appropriate through collaboration with PharmD and provider.  ? ?Interventions: ?1:1 collaboration with Venita Lick, NP regarding development and update of comprehensive plan of care as evidenced by provider attestation and co-signature ?Inter-disciplinary care team collaboration (see longitudinal plan of care) ?Comprehensive medication review performed; medication list updated in electronic medical record ? ?Hypertension and Systolic Heart Failure (BP goal <130/80) ?-Controlled ?-Most recent change is furosemide 10 mg daily d/t swelling.  ?-heart attack January 2022 - two stents placed. Last echo EF 55-60% ?-Current treatment: ?Lisinopril 5 mg once daily ?Metoprolol tartrate 25 mg tab - half tab twice daily ?Isosorbide mononitrate 30 mg 24 hr once daily   ?Furosemide 10 mg daily for swelling ?-Current home readings: not testing ?-Current dietary habits: lower carb now, less processed and fried foods after heart attack ?-Counseled on salt restrictions and daily weights. Reads labels, trying to cut down on hot dogs.  ?-Current exercise habits: joined [employer]  since last phone visit 42m ago. Also doing dumbbell exercises at home. 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  ?-Denies hypotensive/hypertensive symptoms ?-Educated on BP goals and benefits of medications for prevention of heart attack, stroke and kidney damage; ?-Counseled to monitor BP at home as directed, document, and provide log at future appointments. Counsled on cuff selection - recommend cuff for bicep. Insurance has otc benefit that he can use ?-Recommended to continue current medication ? ?Hyperlipidemia: (LDL goal < 55-70) ?-Not ideally controlled, last LDL in 70s ?-s/p MI DESx2 Jan 2022 ?-Current treatment: ?Atorvastatin 80 mg once daily  ?-Medications previously tried: n/a  ?-Current dietary patterns: see htn ?-Current exercise habits: see thn ?-Educated on Cholesterol goals;  ?Benefits of statin for ASCVD risk reduction; ?-Counseled on diet and exercise extensively ?Recommended to continue current medication ? ?COPD (Goal: control symptoms and prevent exacerbations) ?-Controlled ?-Current smoker, last three eos 0.3, 0.4, 0.6 ?-Current treatment  ?Albuterol inhaler prn ?Symbicort - approved for PAP 2023 ?-Gold Grade: Gold 2 (FEV1 50-79%) ?-Current COPD Classification:  A (low sx, <2 exacerbations/yr) ?-Previous CAT score: 8 - no updates 10/2021 ?-Pulmonary function testing: 2020 ?-Exacerbations requiring treatment in last 6 months: 0 ?-Patient reports consistent use of maintenance inhaler ?-Frequency of rescue inhaler use: prn ?-Counseled on [place] inhaler technique; ?Benefits of consistent maintenance inhaler use ?-Recommended to continue current medication ? ?Patient Goals/Self-Care Activities ?Patient will:  ?- target a minimum of 150 minutes of moderate intensity exercise weekly ? ?Medication Assistance: Symbicort PAP obtained through [place] medication assistance program.  Enrollment ends dec 2023 ? ?Patient's preferred pharmacy is: ? ?[facility]  [place], [place] - [address] ?[address] ?[place] [address] ?Phone: [phone] Fax: [phone] ? ? ?Pt endorses 100% compliance ? ?Follow Up:  Patient agrees to Care Plan and Follow-up. ?Plan: HC PAP support PRN. CP 10m tel.  ?  ? ? ?The patient verbalized understanding of instructions provided today and agreed to receive a MyChart copy of patient instruction and/or educational materials. ?Telephone follow up appointment with pharmacy team member scheduled for: See next appointment with "Care Management Staff" under "What's Next" below.   ?

## 2021-10-23 ENCOUNTER — Other Ambulatory Visit: Payer: Self-pay | Admitting: Nurse Practitioner

## 2021-10-23 NOTE — Telephone Encounter (Signed)
Requested Prescriptions  ?Pending Prescriptions Disp Refills  ?? lisinopril (ZESTRIL) 5 MG tablet 100 tablet 1  ?  Sig: Take 1 tablet (5 mg total) by mouth daily.  ?  ? Cardiovascular:  ACE Inhibitors Passed - 10/23/2021  8:17 AM  ?  ?  Passed - Cr in normal range and within 180 days  ?  Creatinine, Ser  ?Date Value Ref Range Status  ?09/26/2021 0.85 0.76 - 1.27 mg/dL Final  ?   ?  ?  Passed - K in normal range and within 180 days  ?  Potassium  ?Date Value Ref Range Status  ?09/26/2021 4.2 3.5 - 5.2 mmol/L Final  ?   ?  ?  Passed - Patient is not pregnant  ?  ?  Passed - Last BP in normal range  ?  BP Readings from Last 1 Encounters:  ?09/26/21 131/75  ?   ?  ?  Passed - Valid encounter within last 6 months  ?  Recent Outpatient Visits   ?      ? 3 weeks ago Chronic systolic heart failure ([place])  ? [place], Emsworth T, NP  ? 1 month ago Chronic obstructive pulmonary disease, unspecified COPD type ([place])  ? [place], [place] T, NP  ? 5 months ago Chronic systolic heart failure ([place])  ? [facility] [place], [place] T, NP  ? 9 months ago Chronic systolic heart failure ([place])  ? [facility] [place], Romeo T, NP  ? 11 months ago Cholecystostomy care [facility])  ? [facility] [place], Henrine Screws T, NP  ?  ?  ?Future Appointments   ?        ? In 2 weeks [name], Barbaraann Faster, NP [employer], [facility]  ? In 3 weeks Jonathon Bellows, MD [place]  ? In 2 months  [facility], [facility]  ? In 5 months [name], Barbaraann Faster, NP [employer], [facility]  ?  ? ?  ?  ?  ? ?

## 2021-10-31 ENCOUNTER — Other Ambulatory Visit: Payer: Self-pay | Admitting: Nurse Practitioner

## 2021-10-31 ENCOUNTER — Other Ambulatory Visit: Payer: Self-pay | Admitting: Surgery

## 2021-10-31 NOTE — Telephone Encounter (Signed)
Requested Prescriptions  ?Pending Prescriptions Disp Refills  ?? sildenafil (REVATIO) 20 MG tablet [Pharmacy Med Name: SILDENAFIL 20 MG TABLET] 10 tablet 2  ?  Sig: Take 1 tablet (20 mg total) by mouth daily as needed (erectile dysfunction).  ?  ? Urology: Erectile Dysfunction Agents Passed - 10/31/2021  1:01 PM  ?  ?  Passed - AST in normal range and within 360 days  ?  AST  ?Date Value Ref Range Status  ?09/05/2021 35 0 - 40 IU/L Final  ?   ?  ?  Passed - ALT in normal range and within 360 days  ?  ALT  ?Date Value Ref Range Status  ?09/05/2021 41 0 - 44 IU/L Final  ?   ?  ?  Passed - Last BP in normal range  ?  BP Readings from Last 1 Encounters:  ?09/26/21 131/75  ?   ?  ?  Passed - Valid encounter within last 12 months  ?  Recent Outpatient Visits   ?      ? 1 month ago Chronic systolic heart failure (HCC)  ? [facility] [place], [place] T, NP  ? 1 month ago Chronic obstructive pulmonary disease, unspecified COPD type (HCC)  ? [facility] [place], [place] T, NP  ? 5 months ago Chronic systolic heart failure (HCC)  ? [facility] [place], [place] T, NP  ? 10 months ago Chronic systolic heart failure (HCC)  ? [facility] [place], [place] T, NP  ? 11 months ago Cholecystostomy care [facility])  ? [facility] [place], Corrie Dandy T, NP  ?  ?  ?Future Appointments   ?        ? In 1 week [name], NP [facility], [facility]  ? In 2 weeks Wyline Mood, MD [facility]  ? In 2 months  [facility], [facility]  ? In 4 months [name], Dorie Rank, NP [employer] [facility], [facility]  ?  ? ?  ?  ?  ? ?

## 2021-11-08 ENCOUNTER — Ambulatory Visit: Payer: Medicare HMO | Admitting: Nurse Practitioner

## 2021-11-12 DIAGNOSIS — I110 Hypertensive heart disease with heart failure: Secondary | ICD-10-CM | POA: Diagnosis not present

## 2021-11-12 DIAGNOSIS — I5020 Unspecified systolic (congestive) heart failure: Secondary | ICD-10-CM

## 2021-11-12 DIAGNOSIS — E785 Hyperlipidemia, unspecified: Secondary | ICD-10-CM

## 2021-11-12 DIAGNOSIS — J449 Chronic obstructive pulmonary disease, unspecified: Secondary | ICD-10-CM | POA: Diagnosis not present

## 2021-11-16 ENCOUNTER — Ambulatory Visit (INDEPENDENT_AMBULATORY_CARE_PROVIDER_SITE_OTHER): Payer: Medicare HMO | Admitting: Gastroenterology

## 2021-11-16 ENCOUNTER — Encounter: Payer: Self-pay | Admitting: Gastroenterology

## 2021-11-16 VITALS — BP 167/89 | HR 88 | Temp 98.3°F | Wt 179.4 lb

## 2021-11-16 DIAGNOSIS — B1920 Unspecified viral hepatitis C without hepatic coma: Secondary | ICD-10-CM

## 2021-11-16 DIAGNOSIS — R69 Illness, unspecified: Secondary | ICD-10-CM | POA: Diagnosis not present

## 2021-11-16 NOTE — Progress Notes (Signed)
?  ?Wyline Mood MD, MRCP(U.K) ?[address]  ?[address]  ?[place], [place] [address]  ?Main: [phone]  ?Fax: [phone] ? ? ?Primary Care Physician: [name], NP ? ?Primary Gastroenterologist:  Dr. Wyline Mood  ? ?Chief Complaint  ?Patient presents with  ? Hepatitis  ? ? ?HPI: Andrew Potts is a 71 y.o. male ? ? ?Summary of history : ?He was referred back and seen in December 2020 for chronic hepatitis C genotype Ia treatment na?ve no biochemical evidence of cirrhosis positive hepatitis B core total antibody suggesting a past infection.  He was lost to follow-up did not follow-up afterwards. ? ? ?Interval history 2020-11/16/2021 ? ?09/05/2021: CBC shows a hemoglobin of 16.3 g with a platelet count of 282, CMP normal. ?09/20/2020: Right upper quadrant ultrasound shows no evidence of cirrhosis. ? ?Since his last visit he had a heart attack and had his gallbladder taken out and hence there is a delay in coming and seeing as he is ready now to proceed with treatment for hepatitis C.  No new complaints at this point of time. ?Current Outpatient Medications  ?Medication Sig Dispense Refill  ? albuterol (VENTOLIN HFA) 108 (90 Base) MCG/ACT inhaler Inhale 2 puffs into the lungs every 6 (six) hours as needed for wheezing or shortness of breath. 18 g 5  ? aspirin EC 81 MG EC tablet Take 1 tablet (81 mg total) by mouth daily. Swallow whole. 30 tablet 11  ? atorvastatin (LIPITOR) 80 MG tablet Take 1 tablet (80 mg total) by mouth daily. 90 tablet 4  ? Ensure (ENSURE) Take 237 mLs by mouth daily.    ? furosemide (LASIX) 20 MG tablet Take 0.5 tablets (10 mg total) by mouth daily. 45 tablet 4  ? lisinopril (ZESTRIL) 5 MG tablet Take 1 tablet (5 mg total) by mouth daily. 100 tablet 1  ? metoprolol tartrate (LOPRESSOR) 25 MG tablet Take 0.5 tablets (12.5 mg total) by mouth 2 (two) times daily. 90 tablet 4  ? pantoprazole (PROTONIX) 40 MG tablet Take 1 tablet (40 mg total) by mouth daily. 90 tablet 0  ? sildenafil  (REVATIO) 20 MG tablet Take 1 tablet (20 mg total) by mouth daily as needed (erectile dysfunction). 10 tablet 2  ? SYMBICORT 160-4.5 MCG/ACT inhaler Inhale 2 puffs into the lungs 2 (two) times daily. 10.2 g 5  ? ?No current facility-administered medications for this visit.  ? ? ?Allergies as of 11/16/2021 - Review Complete 11/16/2021  ?Allergen Reaction Noted  ? Levaquin [levofloxacin] Swelling 02/04/2019  ? Codeine Itching 04/22/2019  ? Codone [hydrocodone] Other (See Comments) 02/04/2019  ? ? ?ROS: ? ?General: Negative for anorexia, weight loss, fever, chills, fatigue, weakness. ?ENT: Negative for hoarseness, difficulty swallowing , nasal congestion. ?CV: Negative for chest pain, angina, palpitations, dyspnea on exertion, peripheral edema.  ?Respiratory: Negative for dyspnea at rest, dyspnea on exertion, cough, sputum, wheezing.  ?GI: See history of present illness. ?GU:  Negative for dysuria, hematuria, urinary incontinence, urinary frequency, nocturnal urination.  ?Endo: Negative for unusual weight change.  ?  ?Physical Examination: ? ? BP (!) 167/89   Pulse 88   Temp 98.3 ?F (36.8 ?C) (Oral)   Wt 179 lb 6.4 oz (81.4 kg)   BMI 28.10 kg/m?  ? ?General: Well-nourished, well-developed in no acute distress.  ?Eyes: No icterus. Conjunctivae pink. ?Neuro: Alert and oriented x 3.  Grossly intact. ?Skin: Warm and dry, no jaundice.   ?Psych: Alert and cooperative, normal mood and affect. ? ? ?  Imaging Studies: ?No results found. ? ?Assessment and Plan:  ? ?Andrew Potts is a 71 y.o. y/o male with a history of chronic hepatitis C genotype Ia treatment na?ve noncirrhotic lost to follow-up since 2020 here to reestablish care and discussed about treatment of hepatitis C. ? ?Plan ?1.  Recheck viral load, genotype, hepatitis B and a status.  Obtain liver elastography following which we can discuss the duration of treatment. ?2.  Colon cancer screening discussed he would like to wait till he finishes hepatitis C  treatment. ? ? ? ?Dr Wyline Mood  MD,MRCP [facility]) ?Follow up in 8 weeks  ?

## 2021-11-17 LAB — HEPATITIS B E ANTIBODY: Hep B E Ab: POSITIVE — AB

## 2021-11-17 LAB — HEPATITIS A ANTIBODY, TOTAL: hep A Total Ab: NEGATIVE

## 2021-11-17 LAB — HEPATITIS B SURFACE ANTIGEN: Hepatitis B Surface Ag: NEGATIVE

## 2021-11-17 LAB — HIV ANTIBODY (ROUTINE TESTING W REFLEX): HIV Screen 4th Generation wRfx: NONREACTIVE

## 2021-11-17 LAB — HEPATITIS B E ANTIGEN: Hep B E Ag: NEGATIVE

## 2021-11-17 LAB — HEPATITIS B CORE ANTIBODY, TOTAL: Hep B Core Total Ab: POSITIVE — AB

## 2021-11-18 LAB — HCV RNA QUANT
HCV log10: 6.508 log10 IU/mL
Hepatitis C Quantitation: 3220000 IU/mL

## 2021-11-20 ENCOUNTER — Ambulatory Visit
Admission: RE | Admit: 2021-11-20 | Discharge: 2021-11-20 | Disposition: A | Discharge status: Home / Self Care | Payer: Medicare HMO | Source: Ambulatory Visit | Attending: Gastroenterology | Admitting: Gastroenterology

## 2021-11-20 DIAGNOSIS — R69 Illness, unspecified: Secondary | ICD-10-CM | POA: Diagnosis not present

## 2021-11-20 DIAGNOSIS — K7689 Other specified diseases of liver: Secondary | ICD-10-CM | POA: Diagnosis not present

## 2021-11-20 DIAGNOSIS — B1920 Unspecified viral hepatitis C without hepatic coma: Secondary | ICD-10-CM | POA: Diagnosis not present

## 2021-11-20 NOTE — Progress Notes (Signed)
Needs hep b viral load to be checked

## 2021-11-23 ENCOUNTER — Telehealth: Payer: Self-pay

## 2021-11-23 DIAGNOSIS — B1920 Unspecified viral hepatitis C without hepatic coma: Secondary | ICD-10-CM

## 2021-11-23 NOTE — Telephone Encounter (Signed)
-----   Message from Wyline Mood, MD sent at 11/20/2021  1:05 PM EDT ----- ?Needs hep b viral load to be checked ?

## 2021-11-23 NOTE — Telephone Encounter (Signed)
Patient was contacted and informed to come in for another type of blood test that Dr. Vicente Males was requesting. Patient stated that he will come in but until Monday of next week. ? ?Dr. Vicente Males, is this the correct blood test? ?

## 2021-11-27 DIAGNOSIS — B1920 Unspecified viral hepatitis C without hepatic coma: Secondary | ICD-10-CM | POA: Diagnosis not present

## 2021-11-27 DIAGNOSIS — R69 Illness, unspecified: Secondary | ICD-10-CM | POA: Diagnosis not present

## 2021-11-28 ENCOUNTER — Other Ambulatory Visit: Payer: Self-pay

## 2021-11-28 MED ORDER — SYMBICORT 160-4.5 MCG/ACT IN AERO
2.0000 | INHALATION_SPRAY | Freq: Two times a day (BID) | RESPIRATORY_TRACT | 5 refills | Status: DC
Start: 2021-11-28 — End: 2022-09-28

## 2021-11-29 DIAGNOSIS — I2510 Atherosclerotic heart disease of native coronary artery without angina pectoris: Secondary | ICD-10-CM | POA: Diagnosis not present

## 2021-11-29 DIAGNOSIS — R0602 Shortness of breath: Secondary | ICD-10-CM | POA: Diagnosis not present

## 2021-11-29 DIAGNOSIS — I10 Essential (primary) hypertension: Secondary | ICD-10-CM | POA: Diagnosis not present

## 2021-11-30 LAB — HEPATITIS B DNA, ULTRAQUANTITATIVE, PCR: HBV DNA SERPL PCR-ACNC: NOT DETECTED IU/mL

## 2022-01-01 ENCOUNTER — Ambulatory Visit: Payer: Medicare HMO | Admitting: Gastroenterology

## 2022-01-01 ENCOUNTER — Encounter: Payer: Self-pay | Admitting: Gastroenterology

## 2022-01-01 VITALS — BP 126/79 | HR 75 | Temp 98.2°F | Wt 182.6 lb

## 2022-01-01 DIAGNOSIS — Z23 Encounter for immunization: Secondary | ICD-10-CM

## 2022-01-01 DIAGNOSIS — R69 Illness, unspecified: Secondary | ICD-10-CM | POA: Diagnosis not present

## 2022-01-01 DIAGNOSIS — B1920 Unspecified viral hepatitis C without hepatic coma: Secondary | ICD-10-CM

## 2022-01-01 NOTE — Progress Notes (Signed)
Andrew Mood MD, MRCP(U.K) [address]  [address]  [place], [place] [address]  Main: [phone]  Fax: [phone]   Primary Care Physician: [name], NP  Primary Gastroenterologist:  Dr. Wyline Potts   Chief Complaint  Patient presents with   Hepatitis C    HPI: Andrew Potts is a 71 y.o. male Summary of history : He was referred back and seen in December 2020 for chronic hepatitis C genotype Ia treatment nave no biochemical evidence of cirrhosis positive hepatitis B core total antibody suggesting a past infection.  He was lost to follow-up did not follow-up afterwards.   09/05/2021: CBC shows a hemoglobin of 16.3 g with a platelet count of 282, CMP normal. 09/20/2020: Right upper quadrant ultrasound shows no evidence of cirrhosis.  Possible that he had contracted hepatitis C from a blood transfusion in early 1970s   Interval history 11/16/2021-01/01/2022   11/27/2021 hepatitis B virus not detected, hepatitis C virus 3.2 million, hepatitis B surface antigen negative, hepatitis B E antigen negative, BE antibody positive.  Hepatitis B core total antibody positive hepatitis A total antibody negative HIV negative  11/20/2021 right upper quadrant ultrasound with elastography shows median K PA of 15.5 highly suggestive of CA CLD, albumin 4.3 bilirubin normal AST ALT normal.  He is doing well.  Ready to start treatment for hepatitis C  Current Outpatient Medications  Medication Sig Dispense Refill   albuterol (VENTOLIN HFA) 108 (90 Base) MCG/ACT inhaler Inhale 2 puffs into the lungs every 6 (six) hours as needed for wheezing or shortness of breath. 18 g 5   aspirin EC 81 MG EC tablet Take 1 tablet (81 mg total) by mouth daily. Swallow whole. 30 tablet 11   atorvastatin (LIPITOR) 80 MG tablet Take 1 tablet (80 mg total) by mouth daily. 90 tablet 4   Ensure (ENSURE) Take 237 mLs by mouth daily.     furosemide (LASIX) 20 MG tablet Take 0.5 tablets (10 mg total) by mouth  daily. 45 tablet 4   lisinopril (ZESTRIL) 5 MG tablet Take 1 tablet (5 mg total) by mouth daily. 100 tablet 1   metoprolol tartrate (LOPRESSOR) 25 MG tablet Take 0.5 tablets (12.5 mg total) by mouth 2 (two) times daily. 90 tablet 4   pantoprazole (PROTONIX) 40 MG tablet Take 1 tablet (40 mg total) by mouth daily. 90 tablet 0   sildenafil (REVATIO) 20 MG tablet Take 1 tablet (20 mg total) by mouth daily as needed (erectile dysfunction). 10 tablet 2   SYMBICORT 160-4.5 MCG/ACT inhaler Inhale 2 puffs into the lungs 2 (two) times daily. 10.2 g 5   No current facility-administered medications for this visit.    Allergies as of 01/01/2022 - Review Complete 01/01/2022  Allergen Reaction Noted   Levaquin [levofloxacin] Swelling 02/04/2019   Codeine Itching 04/22/2019   Codone [hydrocodone] Other (See Comments) 02/04/2019    ROS:  General: Negative for anorexia, weight loss, fever, chills, fatigue, weakness. ENT: Negative for hoarseness, difficulty swallowing , nasal congestion. CV: Negative for chest pain, angina, palpitations, dyspnea on exertion, peripheral edema.  Respiratory: Negative for dyspnea at rest, dyspnea on exertion, cough, sputum, wheezing.  GI: See history of present illness. GU:  Negative for dysuria, hematuria, urinary incontinence, urinary frequency, nocturnal urination.  Endo: Negative for unusual weight change.    Physical Examination:   BP 126/79   Pulse 75   Temp 98.2 F (36.8 C) (Oral)   Wt 182 lb 9.6 oz (82.8 kg)  BMI 28.60 kg/m   General: Well-nourished, well-developed in no acute distress.  Eyes: No icterus. Conjunctivae pink. Mouth: Oropharyngeal mucosa moist and pink , no lesions erythema or exudate. Neuro: Alert and oriented x 3.  Grossly intact. Skin: Warm and dry, no jaundice.   Psych: Alert and cooperative, normal Potts and affect.   Imaging Studies: No results found.  Assessment and Plan:   Andrew Potts is a 71 y.o. y/o male with a history  of chronic hepatitis C genotype Ia treatment nave noncirrhotic lost to follow-up since 2020 here to reestablish care and discussed about treatment of hepatitis C.  Ultrasound elastography shows an elevated K PA of 15.5 suggestive of chronic liver disease.  Normal albumin normal platelet count making significant portal hypertension less likely.  Child Pugh score A- 5   Plan 1.  Commence on Epclusa for 12 weeks with or without food 2.  Colon cancer screening discussed he would like to wait till he finishes hepatitis C treatment. 3.  Explained that he has had hepatitis B in the past but his body has cleared it and he is now immune to it.  Explained that a small chance of reactivation of hepatitis B hence we will monitor his LFTs during treatment 4.  Check LFTs 2 weeks after starting hep C treatment  Dr Andrew Mood  MD,MRCP [facility]) Follow up in 6 to 8 weeks after commencing treatment for hepatitis C

## 2022-01-01 NOTE — Patient Instructions (Signed)
Hepatitis A vaccine in 6 months.

## 2022-01-01 NOTE — Addendum Note (Signed)
Addended by: Adela Ports on: 01/01/2022 02:57 PM   Modules accepted: Orders

## 2022-01-03 ENCOUNTER — Telehealth: Payer: Self-pay

## 2022-01-03 DIAGNOSIS — B1920 Unspecified viral hepatitis C without hepatic coma: Secondary | ICD-10-CM

## 2022-01-03 NOTE — Telephone Encounter (Signed)
Called patient to let him know that he is needing to come in and have another lab drawn so I could send his prescription to [facility] specialty pharmacy. Once we have results, I will need to fax patient's medical record. Patient agreed to come in tomorrow 01/04/2022 and have HCV genotype drawn.

## 2022-01-04 ENCOUNTER — Other Ambulatory Visit: Payer: Self-pay | Admitting: Gastroenterology

## 2022-01-04 DIAGNOSIS — B1920 Unspecified viral hepatitis C without hepatic coma: Secondary | ICD-10-CM | POA: Diagnosis not present

## 2022-01-04 DIAGNOSIS — R69 Illness, unspecified: Secondary | ICD-10-CM | POA: Diagnosis not present

## 2022-01-08 LAB — HEPATITIS C GENOTYPE

## 2022-01-11 ENCOUNTER — Ambulatory Visit (INDEPENDENT_AMBULATORY_CARE_PROVIDER_SITE_OTHER): Payer: Medicare HMO | Admitting: *Deleted

## 2022-01-11 DIAGNOSIS — Z0000 Encounter for general adult medical examination without abnormal findings: Secondary | ICD-10-CM | POA: Diagnosis not present

## 2022-01-11 NOTE — Telephone Encounter (Signed)
Called [facility] specialty pharmacy to ask if patient's medication was approved or not. However, I had to leave a voicemail so they could call me back.

## 2022-01-11 NOTE — Patient Instructions (Signed)
Andrew Potts , Thank you for taking time to come for your Medicare Wellness Visit. I appreciate your ongoing commitment to your health goals. Please review the following plan we discussed and let me know if I can assist you in the future.   Screening recommendations/referrals: Colonoscopy: Education provided Recommended yearly ophthalmology/optometry visit for glaucoma screening and checkup Recommended yearly dental visit for hygiene and checkup  Vaccinations: Influenza vaccine: up to date Pneumococcal vaccine: up to date Tdap vaccine: Education provided Shingles vaccine: Education provided    Advanced directives: Education provided  Conditions/risks identified:   Next appointment: 03-29-2022 @ 11:00  Andrew Potts  Preventive Care 65 Years and Older, Male Preventive care refers to lifestyle choices and visits with your health care provider that can promote health and wellness. What does preventive care include? A yearly physical exam. This is also called an annual well check. Dental exams once or twice a year. Routine eye exams. Ask your health care provider how often you should have your eyes checked. Personal lifestyle choices, including: Daily care of your teeth and gums. Regular physical activity. Eating a healthy diet. Avoiding tobacco and drug use. Limiting alcohol use. Practicing safe sex. Taking low doses of aspirin every day. Taking vitamin and mineral supplements as recommended by your health care provider. What happens during an annual well check? The services and screenings done by your health care provider during your annual well check will depend on your age, overall health, lifestyle risk factors, and family history of disease. Counseling  Your health care provider may ask you questions about your: Alcohol use. Tobacco use. Drug use. Emotional well-being. Home and relationship well-being. Sexual activity. Eating habits. History of falls. Memory and ability to  understand (cognition). Work and work Astronomer. Screening  You may have the following tests or measurements: Height, weight, and BMI. Blood pressure. Lipid and cholesterol levels. These may be checked every 5 years, or more frequently if you are over 63 years old. Skin check. Lung cancer screening. You may have this screening every year starting at age 32 if you have a 30-pack-year history of smoking and currently smoke or have quit within the past 15 years. Fecal occult blood test (FOBT) of the stool. You may have this test every year starting at age 45. Flexible sigmoidoscopy or colonoscopy. You may have a sigmoidoscopy every 5 years or a colonoscopy every 10 years starting at age 32. Prostate cancer screening. Recommendations will vary depending on your family history and other risks. Hepatitis C blood test. Hepatitis B blood test. Sexually transmitted disease (STD) testing. Diabetes screening. This is done by checking your blood sugar (glucose) after you have not eaten for a while (fasting). You may have this done every 1-3 years. Abdominal aortic aneurysm (AAA) screening. You may need this if you are a current or former smoker. Osteoporosis. You may be screened starting at age 40 if you are at high risk. Talk with your health care provider about your test results, treatment options, and if necessary, the need for more tests. Vaccines  Your health care provider may recommend certain vaccines, such as: Influenza vaccine. This is recommended every year. Tetanus, diphtheria, and acellular pertussis (Tdap, Td) vaccine. You may need a Td booster every 10 years. Zoster vaccine. You may need this after age 68. Pneumococcal 13-valent conjugate (PCV13) vaccine. One dose is recommended after age 67. Pneumococcal polysaccharide (PPSV23) vaccine. One dose is recommended after age 69. Talk to your health care provider about which screenings and vaccines you  need and how often you need them. This  information is not intended to replace advice given to you by your health care provider. Make sure you discuss any questions you have with your health care provider. Document Released: 07/29/2015 Document Revised: 03/21/2016 Document Reviewed: 05/03/2015 Elsevier Interactive Patient Education  2017 [place] Prevention in the Home Falls can cause injuries. They can happen to people of all ages. There are many things you can do to make your home safe and to help prevent falls. What can I do on the outside of my home? Regularly fix the edges of walkways and driveways and fix any cracks. Remove anything that might make you trip as you walk through a door, such as a raised step or threshold. Trim any bushes or trees on the path to your home. Use bright outdoor lighting. Clear any walking paths of anything that might make someone trip, such as rocks or tools. Regularly check to see if handrails are loose or broken. Make sure that both sides of any steps have handrails. Any raised decks and porches should have guardrails on the edges. Have any leaves, snow, or ice cleared regularly. Use sand or salt on walking paths during winter. Clean up any spills in your garage right away. This includes oil or grease spills. What can I do in the bathroom? Use night lights. Install grab bars by the toilet and in the tub and shower. Do not use towel bars as grab bars. Use non-skid mats or decals in the tub or shower. If you need to sit down in the shower, use a plastic, non-slip stool. Keep the floor dry. Clean up any water that spills on the floor as soon as it happens. Remove soap buildup in the tub or shower regularly. Attach bath mats securely with double-sided non-slip rug tape. Do not have throw rugs and other things on the floor that can make you trip. What can I do in the bedroom? Use night lights. Make sure that you have a light by your bed that is easy to reach. Do not use any sheets or  blankets that are too big for your bed. They should not hang down onto the floor. Have a firm chair that has side arms. You can use this for support while you get dressed. Do not have throw rugs and other things on the floor that can make you trip. What can I do in the kitchen? Clean up any spills right away. Avoid walking on wet floors. Keep items that you use a lot in easy-to-reach places. If you need to reach something above you, use a strong step stool that has a grab bar. Keep electrical cords out of the way. Do not use floor polish or wax that makes floors slippery. If you must use wax, use non-skid floor wax. Do not have throw rugs and other things on the floor that can make you trip. What can I do with my stairs? Do not leave any items on the stairs. Make sure that there are handrails on both sides of the stairs and use them. Fix handrails that are broken or loose. Make sure that handrails are as long as the stairways. Check any carpeting to make sure that it is firmly attached to the stairs. Fix any carpet that is loose or worn. Avoid having throw rugs at the top or bottom of the stairs. If you do have throw rugs, attach them to the floor with carpet tape. Make sure that  you have a light switch at the top of the stairs and the bottom of the stairs. If you do not have them, ask someone to add them for you. What else can I do to help prevent falls? Wear shoes that: Do not have high heels. Have rubber bottoms. Are comfortable and fit you well. Are closed at the toe. Do not wear sandals. If you use a stepladder: Make sure that it is fully opened. Do not climb a closed stepladder. Make sure that both sides of the stepladder are locked into place. Ask someone to hold it for you, if possible. Clearly mark and make sure that you can see: Any grab bars or handrails. First and last steps. Where the edge of each step is. Use tools that help you move around (mobility aids) if they are  needed. These include: Canes. Walkers. Scooters. Crutches. Turn on the lights when you go into a dark area. Replace any light bulbs as soon as they burn out. Set up your furniture so you have a clear path. Avoid moving your furniture around. If any of your floors are uneven, fix them. If there are any pets around you, be aware of where they are. Review your medicines with your doctor. Some medicines can make you feel dizzy. This can increase your chance of falling. Ask your doctor what other things that you can do to help prevent falls. This information is not intended to replace advice given to you by your health care provider. Make sure you discuss any questions you have with your health care provider. Document Released: 04/28/2009 Document Revised: 12/08/2015 Document Reviewed: 08/06/2014 Elsevier Interactive Patient Education  2017 [employer].

## 2022-01-11 NOTE — Progress Notes (Signed)
Subjective:   Andrew Potts is a 71 y.o. male who presents for Medicare Annual/Subsequent preventive examination.  I connected with  Jas L Cacioppo on 01/11/22 by a telephone enabled telemedicine application and verified that I am speaking with the correct person using two identifiers.   I discussed the limitations of evaluation and management by telemedicine. The patient expressed understanding and agreed to proceed.  Patient location: home  Provider location: [facility]   Review of Systems     Cardiac Risk Factors include: advanced age (>74men, >59 women);hypertension;male gender;obesity (BMI >30kg/m2)     Objective:    Today's Vitals   There is no height or weight on file to calculate BMI.     01/11/2022   10:03 AM 01/09/2021    9:48 AM 12/07/2020    9:13 AM 12/02/2020    4:10 PM 11/04/2020   10:16 AM 10/21/2020    9:08 AM 09/20/2020    8:53 PM  Advanced Directives  Does Patient Have a Medical Advance Directive? No No No No No No No  Would patient like information on creating a medical advance directive? No - Patient declined  No - Patient declined   No - Patient declined No - Patient declined    Current Medications (verified) Outpatient Encounter Medications as of 01/11/2022  Medication Sig   albuterol (VENTOLIN HFA) 108 (90 Base) MCG/ACT inhaler Inhale 2 puffs into the lungs every 6 (six) hours as needed for wheezing or shortness of breath.   aspirin EC 81 MG EC tablet Take 1 tablet (81 mg total) by mouth daily. Swallow whole.   atorvastatin (LIPITOR) 80 MG tablet Take 1 tablet (80 mg total) by mouth daily.   Ensure (ENSURE) Take 237 mLs by mouth daily.   furosemide (LASIX) 20 MG tablet Take 0.5 tablets (10 mg total) by mouth daily.   lisinopril (ZESTRIL) 5 MG tablet Take 1 tablet (5 mg total) by mouth daily.   metoprolol tartrate (LOPRESSOR) 25 MG tablet Take 0.5 tablets (12.5 mg total) by mouth 2 (two) times daily.   pantoprazole (PROTONIX) 40 MG tablet Take 1  tablet (40 mg total) by mouth daily.   sildenafil (REVATIO) 20 MG tablet Take 1 tablet (20 mg total) by mouth daily as needed (erectile dysfunction).   SYMBICORT 160-4.5 MCG/ACT inhaler Inhale 2 puffs into the lungs 2 (two) times daily.   No facility-administered encounter medications on file as of 01/11/2022.    Allergies (verified) Levaquin [levofloxacin], Codeine, and Codone [hydrocodone]   History: Past Medical History:  Diagnosis Date   Anginal pain (HCC)    Aortic atherosclerosis (HCC)    "extensive" per CT imaging on 09/20/2020   Arthritis    Cardiac arrest with successful resuscitation (HCC) 07/28/2020   V.fib arrest in the setting of acute STEMI   CHF (congestive heart failure) (HCC)    COPD (chronic obstructive pulmonary disease) (HCC)    Coronary artery disease    Diverticulosis of colon 09/20/2020   multifocal   Duodenal diverticulum 09/20/2020   Erectile dysfunction    takes PDE5i (sildenafil) medication   GERD (gastroesophageal reflux disease)    HCV (hepatitis C virus)    HLD (hyperlipidemia)    Hypertension    Myocardial infarction (HCC) 07/28/2020   STEMI --> PCI for high grade RCA disease   Pneumonia    Status post insertion of drug eluting coronary artery stent 07/28/2020   DES x 1 to mRCA with overlapping stents to the dRCA   Past Surgical History:  Procedure Laterality Date   ABDOMINAL EXPLORATION SURGERY     CHOLECYSTECTOMY  12/07/2020   COLONOSCOPY     CORONARY/GRAFT ACUTE MI REVASCULARIZATION N/A 07/28/2020   Procedure: CORONARY/GRAFT ACUTE MI REVASCULARIZATION;  Surgeon: Alwyn Pea, MD;  Location: [facility] INVASIVE CV [facility];  Service: Cardiovascular;  Laterality: N/A;   LEFT HEART CATH AND CORONARY ANGIOGRAPHY N/A 07/28/2020   Procedure: LEFT HEART CATH AND CORONARY ANGIOGRAPHY;  Surgeon: Alwyn Pea, MD;  Location: [facility] INVASIVE CV LAB;  Service: Cardiovascular;  Laterality: N/A;   NASAL SINUS SURGERY     Family History  Problem  Relation Age of Onset   Alcohol abuse Father    Alcohol abuse Brother    Aneurysm Brother    Social History   Socioeconomic History   Marital status: Divorced    Spouse name: Not on file   Number of children: Not on file   Years of education: Not on file   Highest education level: Not on file  Occupational History   Occupation: unload trucks    Comment: [employer]  Tobacco Use   Smoking status: Former    Packs/day: 0.50    Years: 47.00    Total pack years: 23.50    Types: Cigarettes    Quit date: 07/28/2020    Years since quitting: 1.4   Smokeless tobacco: Never   Tobacco comments:    08/24/20 given quit smoking paperwork and encouraged continued cessation  Vaping Use   Vaping Use: Never used  Substance and Sexual Activity   Alcohol use: Not Currently   Drug use: Never   Sexual activity: Yes  Other Topics Concern   Not on file  Social History Narrative   Live alone   Social Determinants of Health   Financial Resource Strain: Low Risk  (01/11/2022)   Overall Financial Resource Strain (CARDIA)    Difficulty of Paying Living Expenses: Not hard at all  Food Insecurity: No Food Insecurity (01/11/2022)   Hunger Vital Sign    Worried About Running Out of Food in the Last Year: Never true    Ran Out of Food in the Last Year: Never true  Transportation Needs: No Transportation Needs (01/11/2022)   PRAPARE - Administrator, Civil Service (Medical): No    Lack of Transportation (Non-Medical): No  Physical Activity: Inactive (01/11/2022)   Exercise Vital Sign    Days of Exercise per Week: 0 days    Minutes of Exercise per Session: 0 min  Stress: No Stress Concern Present (01/11/2022)   [employer] of Occupational Health - Occupational Stress Questionnaire    Feeling of Stress : Not at all  Social Connections: Socially Isolated (01/11/2022)   Social Connection and Isolation Panel [NHANES]    Frequency of Communication with Friends and Family: More than three times  a week    Frequency of Social Gatherings with Friends and Family: Three times a week    Attends Religious Services: Never    Active Member of Clubs or Organizations: No    Attends Banker Meetings: Never    Marital Status: Divorced    Tobacco Counseling Counseling given: Not Answered Tobacco comments: 08/24/20 given quit smoking paperwork and encouraged continued cessation   Clinical Intake:  Pre-visit preparation completed: Yes  Pain : No/denies pain     Nutritional Risks: None Diabetes: No  How often do you need to have someone help you when you read instructions, pamphlets, or other written materials from your doctor or pharmacy?:  1 - Never  Diabetic?  no  Interpreter Needed?: No  Information entered by :: Leroy Kennedy LPN   Activities of Daily Living    01/11/2022   10:06 AM 02/07/2021    9:00 AM  In your present state of health, do you have any difficulty performing the following activities:  Hearing? 0 0  Vision? 0 0  Difficulty concentrating or making decisions? 0 0  Walking or climbing stairs? 0 0  Dressing or bathing? 0 0  Doing errands, shopping? 0 0  Preparing Food and eating ? N   Using the Toilet? N   In the past six months, have you accidently leaked urine? N   Do you have problems with loss of bowel control? N   Managing your Medications? N   Managing your Finances? N   Housekeeping or managing your Housekeeping? N     Patient Care Team: Venita Lick, NP as PCP - General (Nurse Practitioner) Madelin Rear, [facility] as Pharmacist (General Practice)  Indicate any recent Medical Services you may have received from other than [facility] providers in the past year (date may be approximate).     Assessment:   This is a routine wellness examination for Bee.  Hearing/Vision screen Hearing Screening - Comments:: No trouble hearing Vision Screening - Comments:: Up to date [facility]  Dietary issues and exercise activities discussed: Current  Exercise Habits: The patient does not participate in regular exercise at present   Goals Addressed             This Visit's Progress    Weight (lb) < 200 lb (90.7 kg)         Depression Screen    01/11/2022   10:08 AM 09/26/2021    1:37 PM 02/07/2021    9:00 AM 01/09/2021    9:51 AM 08/24/2020    1:33 PM 08/10/2020    1:52 PM 10/22/2019    2:54 PM  PHQ 2/9 Scores  PHQ - 2 Score 0 0 0 0 0 0 0  PHQ- 9 Score  0   1 0     Fall Risk    01/11/2022   10:03 AM 02/07/2021    9:00 AM 01/09/2021    9:51 AM 12/19/2020   10:38 AM 08/18/2020   10:00 AM  Fall Risk   Falls in the past year? 0 0 0 0 0  Number falls in past yr: 0 0  0 0  Injury with Fall? 0 0  0 0  Risk for fall due to :  No Fall Risks Medication side effect  No Fall Risks  Follow up Falls evaluation completed;Education provided;Falls prevention discussed Falls evaluation completed;Falls prevention discussed Falls evaluation completed;Education provided;Falls prevention discussed Falls evaluation completed Falls evaluation completed;Education provided;Falls prevention discussed    FALL RISK PREVENTION PERTAINING TO THE HOME:  Any stairs in or around the home? No  If so, are there any without handrails? No  Home free of loose throw rugs in walkways, pet beds, electrical cords, etc? Yes  Adequate lighting in your home to reduce risk of falls? Yes   ASSISTIVE DEVICES UTILIZED TO PREVENT FALLS:  Life alert? No  Use of a cane, walker or w/c? No  Grab bars in the bathroom? No  Shower chair or bench in shower? No  Elevated toilet seat or a handicapped toilet? No   TIMED UP AND GO:  Was the test performed? No .    Cognitive Function:  01/11/2022   10:04 AM 01/09/2021    9:55 AM 03/10/2019   10:02 AM  6CIT Screen  What Year? 0 points 0 points 0 points  What month? 0 points 0 points 0 points  What time? 0 points 0 points 0 points  Count back from 20 0 points 0 points 0 points  Months in reverse 0 points 0 points 0  points  Repeat phrase 0 points 0 points 0 points  Total Score 0 points 0 points 0 points    Immunizations Immunization History  Administered Date(s) Administered   Hepatitis A, Adult 01/01/2022   Influenza, High Dose Seasonal PF 03/21/2019, 04/07/2020   Influenza,inj,Quad PF,6+ Mos 04/07/2018   Influenza-Unspecified 04/07/2021   PFIZER(Purple Top)SARS-COV-2 Vaccination 10/07/2019, 11/04/2019, 05/01/2020, 11/08/2020   Pneumococcal Conjugate-13 03/21/2019   Pneumococcal Polysaccharide-23 05/10/2021    TDAP status: Due, Education has been provided regarding the importance of this vaccine. Advised may receive this vaccine at local pharmacy or Health Dept. Aware to provide a copy of the vaccination record if obtained from local pharmacy or Health Dept. Verbalized acceptance and understanding.  Flu Vaccine status: Up to date  Pneumococcal vaccine status: Up to date  Covid-19 vaccine status: Information provided on how to obtain vaccines.   Qualifies for Shingles Vaccine? Yes   Zostavax completed No   Shingrix Completed?: No.    Education has been provided regarding the importance of this vaccine. Patient has been advised to call insurance company to determine out of pocket expense if they have not yet received this vaccine. Advised may also receive vaccine at local pharmacy or Health Dept. Verbalized acceptance and understanding.  Screening Tests Health Maintenance  Topic Date Due   COVID-19 Vaccine (5 - Pfizer series) 01/27/2022 (Originally 01/03/2021)   Zoster Vaccines- Shingrix (1 of 2) 04/13/2022 (Originally 06/27/2003)   COLONOSCOPY (Pts 45-42yrs Insurance coverage will need to be confirmed)  09/27/2022 (Originally 06/26/1998)   TETANUS/TDAP  09/27/2022 (Originally 06/26/1972)   INFLUENZA VACCINE  02/13/2022   Pneumonia Vaccine 55+ Years old  Completed   Hepatitis C Screening  Completed   HPV VACCINES  Aged Out    Health Maintenance  There are no preventive care reminders to  display for this patient.   Colonoscopy postponed  Lung Cancer Screening: (Low Dose CT Chest recommended if Age 60-80 years, 30 pack-year currently smoking OR have quit w/in 15years.) does qualify.   Lung Cancer Screening Referral:     Additional Screening:  Hepatitis C Screening: does not qualify; Completed 2023  Vision Screening: Recommended annual ophthalmology exams for early detection of glaucoma and other disorders of the eye. Is the patient up to date with their annual eye exam?  Yes  Who is the provider or what is the name of the office in which the patient attends annual eye exams? [facility] If pt is not established with a provider, would they like to be referred to a provider to establish care? No .   Dental Screening: Recommended annual dental exams for proper oral hygiene  Community Resource Referral / Chronic Care Management: CRR required this visit?  No   CCM required this visit?  No      Plan:     I have personally reviewed and noted the following in the patient's chart:   Medical and social history Use of alcohol, tobacco or illicit drugs  Current medications and supplements including opioid prescriptions. Patient is not currently taking opioid prescriptions. Functional ability and status Nutritional status Physical activity Advanced directives List  of other physicians Hospitalizations, surgeries, and ER visits in previous 12 months Vitals Screenings to include cognitive, depression, and falls Referrals and appointments  In addition, I have reviewed and discussed with patient certain preventive protocols, quality metrics, and best practice recommendations. A written personalized care plan for preventive services as well as general preventive health recommendations were provided to patient.     Leroy Kennedy, LPN   [id number]   Nurse Notes:

## 2022-01-12 ENCOUNTER — Ambulatory Visit: Payer: Medicare Other

## 2022-01-29 ENCOUNTER — Ambulatory Visit (INDEPENDENT_AMBULATORY_CARE_PROVIDER_SITE_OTHER): Payer: Medicare HMO | Admitting: Gastroenterology

## 2022-01-29 DIAGNOSIS — Z23 Encounter for immunization: Secondary | ICD-10-CM | POA: Diagnosis not present

## 2022-01-29 NOTE — Progress Notes (Signed)
Per orders of Dr. Tobi Bastos, injection of Hep A, 2 of 3 given in Right Deltoid by Roena Malady.  Patient tolerated injection well.

## 2022-01-31 ENCOUNTER — Telehealth: Payer: Self-pay

## 2022-01-31 ENCOUNTER — Other Ambulatory Visit: Payer: Self-pay | Admitting: Surgery

## 2022-01-31 ENCOUNTER — Other Ambulatory Visit: Payer: Self-pay | Admitting: Nurse Practitioner

## 2022-01-31 NOTE — Telephone Encounter (Signed)
Spoke with Gabby from [facility] to provide them of Jolene's recommendations. Pharmacist verbalized understanding and had no further questions. Called patient to make him aware of this as well. Left message for patient to give our office a call back to discuss.   OK for PEC/Nurse Triage to give note if patient call back.

## 2022-01-31 NOTE — Telephone Encounter (Signed)
-----   Message from Jolene T [name], NP sent at 01/31/2022  8:34 AM EDT ----- Regarding: RE: Statin Sanjuan Sawa can you call below number and provider verbal okay for holding patient statin therapy for 12 weeks while he receive Hep C treatment, however if he has any complaint of chest pain or heart related complaints then we may need to hold Hep C treatment.  Plus please alert patient of this and to alert us if any heart related complaints present.  [facility] Direct # to the pharm D is [phone]  ----- Message ----- From: Devontenno, Melanie Y, CMA Sent: 01/31/2022   8:31 AM EDT To: Jolene T [name], NP Subject: RE: Statin                                     Could you have your assistant/nurse call to give the verbal okay and give the precautions to patient from you?  [facility] Direct # to the pharm D is [phone]  ----- Message ----- From: [name], Jolene T, NP Sent: 01/31/2022   8:22 AM EDT To: Melanie Y Devontenno, CMA Subject: RE: Statin                                     Then we can hold but I would say if any concerns of chest pain or such present we may have to restart. ----- Message ----- From: Devontenno, Melanie Y, CMA Sent: 01/31/2022   8:10 AM EDT To: Jolene T [name], NP Subject: RE: Statin                                     It is a 12 week treatment. They said none of the Hep C medications can be taken along with any statin. ----- Message ----- From: [name], Jolene T, NP Sent: 01/30/2022   6:08 PM EDT To: Melanie Y Devontenno, CMA Subject: RE: Statin                                     He has history of MI, how long would treatment last and could he take any other statin or would they all react?   ----- Message ----- From: Devontenno, Melanie Y, CMA Sent: 01/30/2022   4:59 PM EDT To: Jolene T [name], NP; # Subject: Statin                                         Good afternoon,  I am reaching out to you regarding mutual patient's medication,  Atorvastatin. We are trying to assist patient with getting medication for Hepatitis C treatment with [facility]. However, they are needing to know if patient will be able to stop the statin to be able to complete the Hep C treatment as the medications can not be taken at the same time.  [facility] Direct # to the pharm D is [phone]   Please advise.    Thanks,  Melanie,CMA        

## 2022-01-31 NOTE — Telephone Encounter (Signed)
-----   Message from Jolene T [name], NP sent at 01/31/2022  8:34 AM EDT ----- Regarding: RE: Statin Destiny can you call below number and provider verbal okay for holding patient statin therapy for 12 weeks while he receive Hep C treatment, however if he has any complaint of chest pain or heart related complaints then we may need to hold Hep C treatment.  Plus please alert patient of this and to alert us if any heart related complaints present.  [facility] Direct # to the pharm D is [phone]  ----- Message ----- From: Lavere Shinsky Y, CMA Sent: 01/31/2022   8:31 AM EDT To: Jolene T [name], NP Subject: RE: Statin                                     Could you have your assistant/nurse call to give the verbal okay and give the precautions to patient from you?  [facility] Direct # to the pharm D is [phone]  ----- Message ----- From: [name], Jolene T, NP Sent: 01/31/2022   8:22 AM EDT To: Xzaviar Maloof Y Kenshin Splawn, CMA Subject: RE: Statin                                     Then we can hold but I would say if any concerns of chest pain or such present we may have to restart. ----- Message ----- From: Halton Neas Y, CMA Sent: 01/31/2022   8:10 AM EDT To: Jolene T [name], NP Subject: RE: Statin                                     It is a 12 week treatment. They said none of the Hep C medications can be taken along with any statin. ----- Message ----- From: [name], Jolene T, NP Sent: 01/30/2022   6:08 PM EDT To: Hermon Zea Y Alessandria Henken, CMA Subject: RE: Statin                                     He has history of MI, how long would treatment last and could he take any other statin or would they all react?   ----- Message ----- From: Janayla Marik Y, CMA Sent: 01/30/2022   4:59 PM EDT To: Jolene T [name], NP; # Subject: Statin                                         Good afternoon,  I am reaching out to you regarding mutual patient's medication,  Atorvastatin. We are trying to assist patient with getting medication for Hepatitis C treatment with [facility]. However, they are needing to know if patient will be able to stop the statin to be able to complete the Hep C treatment as the medications can not be taken at the same time.  [facility] Direct # to the pharm D is [phone]   Please advise.    Thanks,  Keslee Harrington,CMA        

## 2022-01-31 NOTE — Telephone Encounter (Signed)
Patient walked into the office and was made aware of Jolene's recommendations via front desk staff Servando Snare. Patient verbalized understanding and has no further questions at this time.

## 2022-01-31 NOTE — Telephone Encounter (Signed)
-----   Message from [name], NP sent at 01/31/2022  8:34 AM EDT ----- Regarding: RE: Statin Jamile Sivils can you call below number and provider verbal okay for holding patient statin therapy for 12 weeks while he receive Hep C treatment, however if he has any complaint of chest pain or heart related complaints then we may need to hold Hep C treatment.  Plus please alert patient of this and to alert [place] if any heart related complaints present.  [facility] Direct # to the [facility] Back is [phone]  ----- Message ----- From: Roena Malady, CMA Sent: 01/31/2022   8:31 AM EDT To: [name], NP Subject: RE: Statin                                     Could you have your assistant/nurse call to give the verbal okay and give the precautions to patient from you?  [facility] Direct # to the [facility] is [phone]  ----- Message ----- From: [name], NP Sent: 01/31/2022   8:22 AM EDT To: Roena Malady, CMA Subject: RE: Statin                                     Then we can hold but I would say if any concerns of chest pain or such present we may have to restart. ----- Message ----- From: Roena Malady, CMA Sent: 01/31/2022   8:10 AM EDT To: [name], NP Subject: RE: Statin                                     It is a 12 week treatment. They said none of the Hep C medications can be taken along with any statin. ----- Message ----- From: [name], NP Sent: 01/30/2022   6:08 PM EDT To: Roena Malady, CMA Subject: RE: Statin                                     He has history of MI, how long would treatment last and could he take any other statin or would they all react?   ----- Message ----- From: Roena Malady, CMA Sent: 01/30/2022   4:59 PM EDT To: [name], NP; # Subject: Statin                                         Good afternoon,  I am reaching out to you regarding mutual patient's medication,  Atorvastatin. We are trying to assist patient with getting medication for Hepatitis C treatment with [facility]. However, they are needing to know if patient will be able to stop the statin to be able to complete the Hep C treatment as the medications can not be taken at the same time.  [facility] Direct # to the pharm D is [phone]   Please advise.    Thanks,  Melanie,CMA

## 2022-02-01 NOTE — Telephone Encounter (Signed)
Requested Prescriptions  Pending Prescriptions Disp Refills  . sildenafil (REVATIO) 20 MG tablet [Pharmacy Med Name: SILDENAFIL 20 MG TABLET] 10 tablet 2    Sig: Take 1 tablet (20 mg total) by mouth daily as needed (erectile dysfunction).     Urology: Erectile Dysfunction Agents Passed - 01/31/2022 12:29 PM      Passed - AST in normal range and within 360 days    AST  Date Value Ref Range Status  09/05/2021 35 0 - 40 IU/L Final         Passed - ALT in normal range and within 360 days    ALT  Date Value Ref Range Status  09/05/2021 41 0 - 44 IU/L Final         Passed - Last BP in normal range    BP Readings from Last 1 Encounters:  01/01/22 126/79         Passed - Valid encounter within last 12 months    Recent Outpatient Visits          4 months ago Chronic systolic heart failure (HCC)   [facility] [place], Jolene T, NP   4 months ago Chronic obstructive pulmonary disease, unspecified COPD type (HCC)   [facility] [name], Jolene T, NP   8 months ago Chronic systolic heart failure (HCC)   [facility] [name], Jolene T, NP   1 year ago Chronic systolic heart failure (HCC)   [facility] [name], Corrie Dandy T, NP   1 year ago Cholecystostomy care [facility])   [facility] [name], Dorie Rank, NP      Future Appointments            In 1 month [name], Dorie Rank, NP [facility], [facility]   In 5 months  [facility]

## 2022-02-12 ENCOUNTER — Ambulatory Visit: Payer: Medicare HMO

## 2022-02-25 ENCOUNTER — Other Ambulatory Visit: Payer: Self-pay | Admitting: Nurse Practitioner

## 2022-02-26 ENCOUNTER — Other Ambulatory Visit: Payer: Self-pay | Admitting: Nurse Practitioner

## 2022-02-26 NOTE — Telephone Encounter (Signed)
Has one refill remaining on this rx. Requested Prescriptions  Pending Prescriptions Disp Refills  . lisinopril (ZESTRIL) 5 MG tablet [Pharmacy Med Name: LISINOPRIL 5 MG TABLET] 100 tablet 0    Sig: Take 1 tablet (5 mg total) by mouth daily.     Cardiovascular:  ACE Inhibitors Passed - 02/25/2022 10:04 AM      Passed - Cr in normal range and within 180 days    Creatinine, Ser  Date Value Ref Range Status  09/26/2021 0.85 0.76 - 1.27 mg/dL Final         Passed - K in normal range and within 180 days    Potassium  Date Value Ref Range Status  09/26/2021 4.2 3.5 - 5.2 mmol/L Final         Passed - Patient is not pregnant      Passed - Last BP in normal range    BP Readings from Last 1 Encounters:  01/01/22 126/79         Passed - Valid encounter within last 6 months    Recent Outpatient Visits          5 months ago Chronic systolic heart failure (HCC)   [facility] [place], Jolene T, NP   5 months ago Chronic obstructive pulmonary disease, unspecified COPD type (HCC)   [facility] [name], Jolene T, NP   9 months ago Chronic systolic heart failure (HCC)   [facility] [name], Jolene T, NP   1 year ago Chronic systolic heart failure (HCC)   [facility] [name], Corrie Dandy T, NP   1 year ago Cholecystostomy care [facility])   [facility] [name], Dorie Rank, NP      Future Appointments            In 1 month [name], Dorie Rank, NP [facility]   In 4 months  [place] [facility]

## 2022-02-27 ENCOUNTER — Telehealth: Payer: Self-pay

## 2022-02-27 NOTE — Telephone Encounter (Signed)
Called pharmacy and spoke with Rudell Cobb- he stated no refill was sent in on 10/23/21 - refilling now. Requested Prescriptions  Pending Prescriptions Disp Refills  . lisinopril (ZESTRIL) 5 MG tablet [Pharmacy Med Name: LISINOPRIL 5 MG TABLET] 100 tablet 0    Sig: Take 1 tablet (5 mg total) by mouth daily.     Cardiovascular:  ACE Inhibitors Passed - 02/26/2022  2:54 PM      Passed - Cr in normal range and within 180 days    Creatinine, Ser  Date Value Ref Range Status  09/26/2021 0.85 0.76 - 1.27 mg/dL Final         Passed - K in normal range and within 180 days    Potassium  Date Value Ref Range Status  09/26/2021 4.2 3.5 - 5.2 mmol/L Final         Passed - Patient is not pregnant      Passed - Last BP in normal range    BP Readings from Last 1 Encounters:  01/01/22 126/79         Passed - Valid encounter within last 6 months    Recent Outpatient Visits          5 months ago Chronic systolic heart failure (HCC)   [facility] [place], Jolene T, NP   5 months ago Chronic obstructive pulmonary disease, unspecified COPD type (HCC)   [facility] [name], Jolene T, NP   9 months ago Chronic systolic heart failure (HCC)   [facility] [name], Jolene T, NP   1 year ago Chronic systolic heart failure (HCC)   [facility] [name], Corrie Dandy T, NP   1 year ago Cholecystostomy care [facility])   [facility] [name], Dorie Rank, NP      Future Appointments            In 1 month [name], Dorie Rank, NP [facility]   In 4 months  [facility]

## 2022-02-27 NOTE — Progress Notes (Signed)
Chronic Care Management Pharmacy Assistant   Name: Andrew Potts  MRN: [id number] DOB: [birth year]  Reason for Encounter: Disease State-General    Recent office visits:  None since last coordination call 10/16/21  Recent consult visits:  01/29/22 - 01/01/22 -11/16/21 Wyline Mood, MD (Hepatitis A vaccine)   11/29/21 Armando Reichert, MD  (SOB) [facility] visits:  None in previous 6 months  Medications: Outpatient Encounter Medications as of 02/27/2022  Medication Sig   albuterol (VENTOLIN HFA) 108 (90 Base) MCG/ACT inhaler Inhale 2 puffs into the lungs every 6 (six) hours as needed for wheezing or shortness of breath.   aspirin EC 81 MG EC tablet Take 1 tablet (81 mg total) by mouth daily. Swallow whole.   atorvastatin (LIPITOR) 80 MG tablet Take 1 tablet (80 mg total) by mouth daily.   Ensure (ENSURE) Take 237 mLs by mouth daily.   furosemide (LASIX) 20 MG tablet Take 0.5 tablets (10 mg total) by mouth daily.   lisinopril (ZESTRIL) 5 MG tablet Take 1 tablet (5 mg total) by mouth daily.   metoprolol tartrate (LOPRESSOR) 25 MG tablet Take 0.5 tablets (12.5 mg total) by mouth 2 (two) times daily.   pantoprazole (PROTONIX) 40 MG tablet Take 1 tablet (40 mg total) by mouth daily.   sildenafil (REVATIO) 20 MG tablet Take 1 tablet (20 mg total) by mouth daily as needed (erectile dysfunction).   SYMBICORT 160-4.5 MCG/ACT inhaler Inhale 2 puffs into the lungs 2 (two) times daily.   No facility-administered encounter medications on file as of 02/27/2022.   Contacted Keldric L Scheller for General Review Call   Chart Review:  Have there been any documented new, changed, or discontinued medications since last visit? No (If yes, include name, dose, frequency, date) Has there been any documented recent hospitalizations or ED visits since last visit with Clinical Pharmacist? No Brief Summary (including medication and/or Diagnosis changes):   Adherence  Review:  Does the Clinical Pharmacist Assistant have access to adherence rates? Yes Adherence rates for STAR metric medications (List medication(s)/day supply/ last 2 fill dates). Adherence rates for medications indicated for disease state being reviewed (List medication(s)/day supply/ last 2 fill dates). Does the patient have >5 day gap between last estimated fill dates for any of the above medications or other medication gaps? No Reason for medication gaps.   Disease State Questions:  Able to connect with Patient? Yes  Did patient have any problems with their health recently? No Note problems and Concerns:  Have you had any admissions or emergency room visits or worsening of your condition(s) since last visit? No Details of ED visit, hospital visit and/or worsening condition(s):  Have you had any visits with new specialists or providers since your last visit? Yes Explain:Patient last seen Dr. Harle Battiest for Hepatitis C. Patient states that he was prescribed a new medication Sofosbuvir/Valtatasvir (Epclusa) 400/100 mg for Hep C. Patient also stated that he is not to take Atorvastatin while on this medication, and he is to take Pantoprazole 4 hrs after he takes this new medication . He states that sometimes he takes the pantoprazole every other day. Have you had any new health care problem(s) since your last visit? No New problem(s) reported:  Have you run out of any of your medications since you last spoke with clinical pharmacist? No What caused you to run out of your medications?  Are there any medications you are not taking as prescribed? No What kept  you from taking your medications as prescribed?  Are you having any issues or side effects with your medications? No Note of issues or side effects:  Do you have any other health concerns or questions you want to discuss with your Clinical Pharmacist before your next visit? No Note additional concerns and questions from  Patient. Are there any health concerns that you feel we can do a better job addressing? No Note Patient's response.  Are you having any problems with any of the following since the last visit: (select all that apply)  None  Details:  12. Any falls since last visit? No  Details:  13. Any increased or uncontrolled pain since last visit? No  Details:  14. Next visit Type: telephone       Visit with:Clinical Pharmacist        Date:05/07/22        Time:9am  15. Additional Details? No    Care Gaps: Colonoscopy-06/26/1998 Diabetic Foot Exam-NA Ophthalmology-NA Dexa Scan - NA Annual Well Visit - Overdue Micro albumin-05/10/21 Hemoglobin A1c- 07/29/20  Star Rating Drugs: Atorvastatin 80 mg-last fill 01/05/22 100 ds Lisinopril 5 mg-last fill 11/20/21 100 ds  Velvet Bathe Clinical Pharmacist Assistant [phone]

## 2022-02-28 ENCOUNTER — Other Ambulatory Visit: Payer: Self-pay | Admitting: Nurse Practitioner

## 2022-03-01 NOTE — Telephone Encounter (Signed)
Requested medication (s) are due for refill today: no  Requested medication (s) are on the active medication list:yes  Last refill:  02/27/22  Future visit scheduled: yes  Notes to clinic:  Unable to refill per protocol, last refill by provider 02/27/22 for 100 days, possible duplicate request.     Requested Prescriptions  Pending Prescriptions Disp Refills   lisinopril (ZESTRIL) 5 MG tablet [Pharmacy Med Name: LISINOPRIL 5 MG TABLET] 100 tablet 0    Sig: Take 1 tablet (5 mg total) by mouth daily.     Cardiovascular:  ACE Inhibitors Passed - 02/28/2022  3:33 PM      Passed - Cr in normal range and within 180 days    Creatinine, Ser  Date Value Ref Range Status  09/26/2021 0.85 0.76 - 1.27 mg/dL Final         Passed - K in normal range and within 180 days    Potassium  Date Value Ref Range Status  09/26/2021 4.2 3.5 - 5.2 mmol/L Final         Passed - Patient is not pregnant      Passed - Last BP in normal range    BP Readings from Last 1 Encounters:  01/01/22 126/79         Passed - Valid encounter within last 6 months    Recent Outpatient Visits           5 months ago Chronic systolic heart failure (HCC)   [facility] [place], Jolene T, NP   5 months ago Chronic obstructive pulmonary disease, unspecified COPD type (HCC)   [facility] [name], Jolene T, NP   9 months ago Chronic systolic heart failure (HCC)   [facility] [name], Jolene T, NP   1 year ago Chronic systolic heart failure (HCC)   [facility] [name], Corrie Dandy T, NP   1 year ago Cholecystostomy care [facility])   [facility] [name], Dorie Rank, NP       Future Appointments             In 4 weeks [name], Dorie Rank, NP [facility]   In 4 months  [place] [facility]

## 2022-03-25 NOTE — Patient Instructions (Signed)
Angina  Angina is discomfort or pain in the chest, neck, arm, jaw, or back. The discomfort is caused by a lack of blood in the middle layer of the heart wall. The middle layer of the heart wall is called the myocardium. What are the causes? This condition is caused by a buildup of fat and cholesterol, or plaque, in your arteries. This buildup narrows the arteries and makes it hard for blood to flow. What increases the risk? Main risks High levels of cholesterol in your blood. High blood pressure. Diabetes. Family history of heart disease. Not exercising or moving enough. Depression. Having had radiation treatment on the left side of your chest. Other risks Tobacco use. Too much body weight (obesity). A diet that is high in unhealthy fats (saturated fats). Stress. Using drugs, such as cocaine. Risks for women Being older than 55 years. Being in menopause. This is the time when a woman no longer has a menstrual period. What are the signs or symptoms? Symptoms in all people Chest pain, which may: Feel like a crushing or squeezing in the chest. Feel like a tightness, pressure, fullness, or heaviness in the chest. Last for more than a few minutes at a time. Stop and come back (recur) after a few minutes. Pain in the neck, arm, jaw, or back. Heartburn or upset stomach (indigestion) for no reason. Being short of breath. Feeling like you may vomit (nauseous). Sudden cold sweats. Symptoms in women and people with diabetes Tiredness. Worry and anxiety. Weakness. Dizziness or fainting. How is this treated? This condition may be treated with: Medicines. These can be given to prevent blood clots, stop a heart attack, lower blood pressure, or treat other risk factors. A procedure to widen a narrowed or blocked artery in the heart. Surgery to allow blood to go around a blocked artery. Follow these instructions at home: Medicines Take over-the-counter and prescription medicines only as  told by your doctor. Do not take these medicines unless your doctor says that you can: NSAIDs. These include: Ibuprofen. Naproxen. Vitamin supplements that have vitamin A, vitamin E, or both. Hormone therapy that contains estrogen with or without progestin. Eating and drinking  Eat a heart-healthy diet that includes: Lots of fresh fruits and vegetables. Whole grains. Low-fat (lean) protein. Low-fat dairy products. Follow instructions from your doctor about what you cannot eat or drink. Activity Follow an exercise program that your doctor tells you. Talk with your doctor about joining a program to make your heart strong again (cardiac rehab). When you feel tired, take a break. Plan breaks if you know you are going to feel tired. Lifestyle  Do not smoke or use any products that contain nicotine or tobacco. If you need help quitting, ask your doctor. If your doctor says you can drink alcohol: Limit how much you have to: 0-1 drink a day for women who are not pregnant. 0-2 drinks a day for men. Know how much alcohol is in your drink. In the [place] one drink equals one 12 oz bottle of beer (355 mL), one 5 oz glass of wine (148 mL), or one 1 oz glass of hard liquor (44 mL). General instructions Stay at a healthy weight. If told to lose weight, work with your doctor to do lose weight safely. Learn to manage stress. If you need help, ask your doctor. Keep your vaccines up to date. Get a flu shot every year. Talk with your doctor if you feel sad. Take a screening test to see if you   are at risk for depression. Work with your doctor to manage any other health problems that you have. These may include diabetes or high blood pressure. Keep all follow-up visits. Get help right away if: You have pain in your chest, neck, arm, jaw, or back, and the pain: Lasts more than a few minutes. Comes back. Does not get better after you take medicine under your tongue (sublingual nitroglycerin). Keeps  getting worse. Comes more often. You have any of these problems: Sweating a lot. Heartburn or upset stomach. Shortness of breath. Trouble breathing. Feeling like you may vomit. Vomiting. Feeling more tired than normal. Feeling nervous or worrying more than normal. Weakness. You are dizzy or light-headed all of a sudden. You faint. These symptoms may be an emergency. Get help right away. Call your local emergency services (911 in the [place] Do not wait to see if the symptoms will go away. Do not drive yourself to the hospital. Summary Angina is discomfort or pain in the chest, neck, arm, neck, or back. Symptoms include chest pain, heartburn or upset stomach, and shortness of breath. Women or people with diabetes may have other symptoms, such as feeling nervous, being worried, or being weak or tired. Take all medicines only as told by your doctor. You should eat a heart-healthy diet and follow an exercise program. This information is not intended to replace advice given to you by your health care provider. Make sure you discuss any questions you have with your health care provider. Document Revised: 12/25/2019 Document Reviewed: 12/25/2019 Elsevier Patient Education  2023 Elsevier Inc.  

## 2022-03-29 ENCOUNTER — Encounter: Payer: Self-pay | Admitting: Nurse Practitioner

## 2022-03-29 ENCOUNTER — Ambulatory Visit (INDEPENDENT_AMBULATORY_CARE_PROVIDER_SITE_OTHER): Payer: Medicare HMO | Admitting: Nurse Practitioner

## 2022-03-29 VITALS — BP 132/72 | HR 60 | Temp 98.1°F | Ht 67.0 in | Wt 188.1 lb

## 2022-03-29 DIAGNOSIS — K219 Gastro-esophageal reflux disease without esophagitis: Secondary | ICD-10-CM

## 2022-03-29 DIAGNOSIS — I208 Other forms of angina pectoris: Secondary | ICD-10-CM | POA: Diagnosis not present

## 2022-03-29 DIAGNOSIS — I10 Essential (primary) hypertension: Secondary | ICD-10-CM | POA: Diagnosis not present

## 2022-03-29 DIAGNOSIS — K732 Chronic active hepatitis, not elsewhere classified: Secondary | ICD-10-CM

## 2022-03-29 DIAGNOSIS — D692 Other nonthrombocytopenic purpura: Secondary | ICD-10-CM

## 2022-03-29 DIAGNOSIS — I5022 Chronic systolic (congestive) heart failure: Secondary | ICD-10-CM | POA: Diagnosis not present

## 2022-03-29 DIAGNOSIS — L918 Other hypertrophic disorders of the skin: Secondary | ICD-10-CM | POA: Diagnosis not present

## 2022-03-29 DIAGNOSIS — I700 Atherosclerosis of aorta: Secondary | ICD-10-CM

## 2022-03-29 DIAGNOSIS — Z0000 Encounter for general adult medical examination without abnormal findings: Secondary | ICD-10-CM

## 2022-03-29 DIAGNOSIS — E782 Mixed hyperlipidemia: Secondary | ICD-10-CM

## 2022-03-29 DIAGNOSIS — I2510 Atherosclerotic heart disease of native coronary artery without angina pectoris: Secondary | ICD-10-CM

## 2022-03-29 DIAGNOSIS — J449 Chronic obstructive pulmonary disease, unspecified: Secondary | ICD-10-CM | POA: Diagnosis not present

## 2022-03-29 DIAGNOSIS — F17219 Nicotine dependence, cigarettes, with unspecified nicotine-induced disorders: Secondary | ICD-10-CM

## 2022-03-29 DIAGNOSIS — N400 Enlarged prostate without lower urinary tract symptoms: Secondary | ICD-10-CM | POA: Diagnosis not present

## 2022-03-29 DIAGNOSIS — Z87891 Personal history of nicotine dependence: Secondary | ICD-10-CM

## 2022-03-29 DIAGNOSIS — I252 Old myocardial infarction: Secondary | ICD-10-CM | POA: Diagnosis not present

## 2022-03-29 LAB — MICROALBUMIN, URINE WAIVED
Creatinine, Urine Waived: 50 mg/dL (ref 10–300)
Microalb, Ur Waived: 10 mg/L (ref 0–19)
Microalb/Creat Ratio: <30 mg/g (ref <30)

## 2022-03-29 MED ORDER — METOPROLOL TARTRATE 25 MG PO TABS: 12.5000 mg | ORAL_TABLET | Freq: Two times a day (BID) | ORAL | 4 refills | Status: DC

## 2022-03-29 MED ORDER — LISINOPRIL 5 MG PO TABS: 5.0000 mg | ORAL_TABLET | Freq: Every day | ORAL | 4 refills | Status: DC

## 2022-03-29 NOTE — Assessment & Plan Note (Signed)
Chronic, stable with Protonix, continue this and check Mag level today.

## 2022-03-29 NOTE — Assessment & Plan Note (Signed)
Since STEMI 07/28/20.  Euvolemic.  At this time continue current medication regimen and collaboration with cardiology as needed.  Recommend: - Reminded to call for an overnight weight gain of >2 pounds or a weekly weight gain of >5 pounds - not adding salt to food and read food labels. Reviewed the importance of keeping daily sodium intake to 2000mg  daily.  Highly recommend he cut back on sodium intake as suspect this is causing some edema. - Avoid Ibuprofen products.

## 2022-03-29 NOTE — Assessment & Plan Note (Signed)
Noted on past imaging.  Recommend he continue daily statin and ASA for prevention + complete cessation of smoking.

## 2022-03-29 NOTE — Assessment & Plan Note (Signed)
Ongoing with STEMI 07/28/20, at this time will continue collaboration with cardiology as needed and medication regimen as prescribed.  No recent CP.

## 2022-03-29 NOTE — Assessment & Plan Note (Signed)
Stable at this time, event took place 07/28/20.  Recommend he continue all current medications and collaboration with cardiology.

## 2022-03-29 NOTE — Assessment & Plan Note (Signed)
Chronic, stable.  Recommend he schedule follow-up with cardiology.  No recent CP.

## 2022-03-29 NOTE — Assessment & Plan Note (Signed)
Chronic, stable with BP at goal today.  Recommend he monitor BP at home three mornings a week and document for provider, educated him on this.  Continue diet and exercise focus, DASH diet.  Labs: CBC, CMP, TSH, urine ALB (10 on check today).  Continue current medication regimen and collaboration with cardiology as needed.  Praised for ongoing adherence to regimen.

## 2022-03-29 NOTE — Assessment & Plan Note (Signed)
Chronic, ongoing, currently under treatment and tolerating well.  Atorvastatin on hold until treatment complete.  Continue collaboration with GI.

## 2022-03-29 NOTE — Assessment & Plan Note (Signed)
Chronic, ongoing.  Spirometry FEV1/FVC 90% and FEV1 76% in August 2020 - will repeat next visit.   Recommend continued cessation smoking.  Continue current medication regimen, discussed possible change to Anoro vs Symbicort with assistance will reach out to [facility] PharmD.  He refuses Lung CA Screening CT at this time due to current medical bills, recommended he obtain this.  Return in 6 months with repeat spirometry.

## 2022-03-29 NOTE — Progress Notes (Signed)
BP 132/72   Pulse 60   Temp 98.1 F (36.7 C) (Oral)   Ht 5\' 7"  (1.702 m)   Wt 188 lb 1.6 oz (85.3 kg)   SpO2 91%   BMI 29.46 kg/m    Subjective:    Patient ID: Andrew Potts, male    DOB: 27-Jun-1951, 71 y.o.   MRN: [id number]  HPI: KIRBY CORTESE is a 71 y.o. male presenting on 03/29/2022 for comprehensive medical examination. Current medical complaints include:none  He currently lives with: self Interim Problems from his last visit: no  HYPERLIPIDEMIA & HF Continues on Lisinopril 5 MG and Lasix 10 MG daily -- statin on hold right now due to Hep C treatment.  HF clinic on 02/07/21 -- repeat echo on 01/24/21 noted EF 55 to 60%. History of STEMI 07/28/20.     Aortic atherosclerosis noted on imaging 09/20/20. Hyperlipidemia status: good compliance Satisfied with current treatment?  yes Side effects:  no Medication compliance: good compliance Past cholesterol meds: none Supplements: none Aspirin:  no The ASCVD Risk score (Arnett DK, et al., 2019) failed to calculate for the following reasons:   The patient has a prior MI or stroke diagnosis Chest pain:  no Coronary artery disease:  no Family history CAD:  no Family history early CAD:  no   COPD Continues on Symbicort daily (gets via assistance -- but reports they are not going to cover anymore) + Proair.     Has continued to quit smoking since his STEMI.  COPD status: stable Satisfied with current treatment?: yes Oxygen use: no Dyspnea frequency: occasional Cough frequency: occasional Rescue inhaler frequency:  once a month Limitation of activity: no Productive cough: none Last Spirometry: FEV1 76% & FEV1/FVC 90% in August 2020 Pneumovax: Up to Date Influenza: Up to Date    HEPATITIS C Last saw GI on 01/29/22 and was started on Epclusa, has to hold his Atorvastatin while taking this due to interaction risk.  Did have blood transfusion in 1973.  No excess alcohol use at home.     Continues on Protonix daily for  GERD. Duration since diagnosis: 2020 Hep C transmission: possibly via blood transfusion in 1973 Genotype: 1a Viral load:  340,000 international units/mL. Hepatology evaluation:yes Liver biopsy:no  Cirrhosis: no Antiviral therapy:not yet Hepatocellular carcinoma screening: no Esophageal varices screening/EGD: no Hepatitis A Vaccine: Not up to Date Hepatitis B Vaccine: unknown Pneumovax Vaccine: Up to Date   Functional Status Survey: Is the patient deaf or have difficulty hearing?: No Does the patient have difficulty seeing, even when wearing glasses/contacts?: No Does the patient have difficulty concentrating, remembering, or making decisions?: No Does the patient have difficulty walking or climbing stairs?: No Does the patient have difficulty dressing or bathing?: No Does the patient have difficulty doing errands alone such as visiting a doctor's office or shopping?: No  FALL RISK:    03/29/2022   11:09 AM 01/11/2022   10:03 AM 02/07/2021    9:00 AM 01/09/2021    9:51 AM 12/19/2020   10:38 AM  Fall Risk   Falls in the past year? 0 0 0 0 0  Number falls in past yr: 0 0 0  0  Injury with Fall? 0 0 0  0  Risk for fall due to : No Fall Risks  No Fall Risks Medication side effect   Follow up Falls evaluation completed Falls evaluation completed;Education provided;Falls prevention discussed Falls evaluation completed;Falls prevention discussed Falls evaluation completed;Education provided;Falls prevention discussed Falls evaluation  completed    Depression Screen    03/29/2022   11:10 AM 01/11/2022   10:08 AM 09/26/2021    1:37 PM 02/07/2021    9:00 AM 01/09/2021    9:51 AM  Depression screen PHQ 2/9  Decreased Interest 0 0 0 0 0  Down, Depressed, Hopeless 0 0 0 0 0  PHQ - 2 Score 0 0 0 0 0  Altered sleeping 0  0    Tired, decreased energy 0  0    Change in appetite 0  0    Feeling bad or failure about yourself  0  0    Trouble concentrating 0  0    Moving slowly or  fidgety/restless 0  0    Suicidal thoughts 0  0    PHQ-9 Score 0  0    Difficult doing work/chores Not difficult at all        Advanced Directives <no information>  Past Medical History:  Past Medical History:  Diagnosis Date   Anginal pain (HCC)    Aortic atherosclerosis (HCC)    "extensive" per CT imaging on 09/20/2020   Arthritis    Cardiac arrest with successful resuscitation (HCC) 07/28/2020   V.fib arrest in the setting of acute STEMI   CHF (congestive heart failure) (HCC)    COPD (chronic obstructive pulmonary disease) (HCC)    Coronary artery disease    Diverticulosis of colon 09/20/2020   multifocal   Duodenal diverticulum 09/20/2020   Erectile dysfunction    takes PDE5i (sildenafil) medication   GERD (gastroesophageal reflux disease)    HCV (hepatitis C virus)    HLD (hyperlipidemia)    Hypertension    Myocardial infarction (HCC) 07/28/2020   STEMI --> PCI for high grade RCA disease   Pneumonia    Status post insertion of drug eluting coronary artery stent 07/28/2020   DES x 1 to mRCA with overlapping stents to the dRCA    Surgical History:  Past Surgical History:  Procedure Laterality Date   ABDOMINAL EXPLORATION SURGERY     CHOLECYSTECTOMY  12/07/2020   COLONOSCOPY     CORONARY/GRAFT ACUTE MI REVASCULARIZATION N/A 07/28/2020   Procedure: CORONARY/GRAFT ACUTE MI REVASCULARIZATION;  Surgeon: Alwyn Pea, MD;  Location: [facility] INV[facility]IVE CV [facility];  Service: Cardiovascular;  Laterality: N/A;   LEFT HEART CATH AND CORONARY ANGIOGRAPHY N/A 07/28/2020   Procedure: LEFT HEART CATH AND CORONARY ANGIOGRAPHY;  Surgeon: Alwyn Pea, MD;  Location: [facility] INV[facility] CV [facility];  Service: Cardiovascular;  Laterality: N/A;   NASAL SINUS SURGERY      Medications:  Current Outpatient Medications on File Prior to Visit  Medication Sig   albuterol (VENTOLIN HFA) 108 (90 Base) MCG/ACT inhaler Inhale 2 puffs into the lungs every 6 (six) hours as needed for wheezing or  shortness of breath.   aspirin EC 81 MG EC tablet Take 1 tablet (81 mg total) by mouth daily. Swallow whole.   Ensure (ENSURE) Take 237 mLs by mouth daily.   EPCLUSA 400-100 MG TABS Take 1 tablet by mouth daily.   furosemide (LASIX) 20 MG tablet Take 0.5 tablets (10 mg total) by mouth daily.   pantoprazole (PROTONIX) 40 MG tablet Take 1 tablet (40 mg total) by mouth daily.   sildenafil (REVATIO) 20 MG tablet Take 1 tablet (20 mg total) by mouth daily as needed (erectile dysfunction).   SYMBICORT 160-4.5 MCG/ACT inhaler Inhale 2 puffs into the lungs 2 (two) times daily.   atorvastatin (LIPITOR) 80 MG tablet  Take 1 tablet (80 mg total) by mouth daily. (Patient not taking: Reported on 03/29/2022)   No current facility-administered medications on file prior to visit.    Allergies:  Allergies  Allergen Reactions   Levaquin [Levofloxacin] Swelling   Codeine Itching    Abdominal pain, nose itching   Codone [Hydrocodone] Other (See Comments)    Abdominal pain, felt loopy    Social History:  Social History   Socioeconomic History   Marital status: Divorced    Spouse name: Not on file   Number of children: Not on file   Years of education: Not on file   Highest education level: Not on file  Occupational History   Occupation: unload trucks    Comment: [employer]  Tobacco Use   Smoking status: Former    Packs/day: 0.50    Years: 47.00    Total pack years: 23.50    Types: Cigarettes    Quit date: 07/28/2020    Years since quitting: 1.6   Smokeless tobacco: Never   Tobacco comments:    08/24/20 given quit smoking paperwork and encouraged continued cessation  Vaping Use   Vaping Use: Never used  Substance and Sexual Activity   Alcohol use: Not Currently   Drug use: Never   Sexual activity: Yes  Other Topics Concern   Not on file  Social History Narrative   Live alone   Social Determinants of Health   Financial Resource Strain: Low Risk  (01/11/2022)   Overall Financial Resource  Strain (CARDIA)    Difficulty of Paying Living Expenses: Not hard at all  Food Insecurity: No Food Insecurity (01/11/2022)   Hunger Vital Sign    Worried About Running Out of Food in the Last Year: Never true    Ran Out of Food in the Last Year: Never true  Transportation Needs: No Transportation Needs (01/11/2022)   PRAPARE - Administrator, Civil Service (Medical): No    Lack of Transportation (Non-Medical): No  Physical Activity: Inactive (01/11/2022)   Exercise Vital Sign    Days of Exercise per Week: 0 days    Minutes of Exercise per Session: 0 min  Stress: No Stress Concern Present (01/11/2022)   [employer] of Occupational Health - Occupational Stress Questionnaire    Feeling of Stress : Not at all  Social Connections: Socially Isolated (01/11/2022)   Social Connection and Isolation Panel [NHANES]    Frequency of Communication with Friends and Family: More than three times a week    Frequency of Social Gatherings with Friends and Family: Three times a week    Attends Religious Services: Never    Active Member of Clubs or Organizations: No    Attends Banker Meetings: Never    Marital Status: Divorced  Catering manager Violence: Not At Risk (01/11/2022)   Humiliation, Afraid, Rape, and Kick questionnaire    Fear of Current or Ex-Partner: No    Emotionally Abused: No    Physically Abused: No    Sexually Abused: No   Social History   Tobacco Use  Smoking Status Former   Packs/day: 0.50   Years: 47.00   Total pack years: 23.50   Types: Cigarettes   Quit date: 07/28/2020   Years since quitting: 1.6  Smokeless Tobacco Never  Tobacco Comments   08/24/20 given quit smoking paperwork and encouraged continued cessation   Social History   Substance and Sexual Activity  Alcohol Use Not Currently    Family History:  Family  History  Problem Relation Age of Onset   Alcohol abuse Father    Alcohol abuse Brother    Aneurysm Brother     Past  medical history, surgical history, medications, allergies, family history and social history reviewed with patient today and changes made to appropriate areas of the chart.   ROS All other ROS negative except what is listed above and in the HPI.      Objective:    BP 132/72   Pulse 60   Temp 98.1 F (36.7 C) (Oral)   Ht  (1.702 m)   Wt 188 lb 1.6 oz (85.3 kg)   SpO2 91%   BMI 29.46 kg/m   Wt Readings from Last 3 Encounters:  03/29/22 188 lb 1.6 oz (85.3 kg)  01/01/22 182 lb 9.6 oz (82.8 kg)  11/16/21 179 lb 6.4 oz (81.4 kg)    No results found.  Physical Exam Vitals and nursing note reviewed.  Constitutional:      General: He is awake. He is not in acute distress.    Appearance: He is well-developed and well-groomed. He is not ill-appearing or toxic-appearing.  HENT:     Head: Normocephalic and atraumatic.     Right Ear: Hearing, tympanic membrane, ear canal and external ear normal. No drainage.     Left Ear: Hearing, tympanic membrane, ear canal and external ear normal. No drainage.     Nose: Nose normal.     Mouth/Throat:     Pharynx: Uvula midline.  Eyes:     General: Lids are normal.        Right eye: No discharge.        Left eye: No discharge.     Extraocular Movements: Extraocular movements intact.     Conjunctiva/sclera: Conjunctivae normal.     Pupils: Pupils are equal, round, and reactive to light.     Visual Fields: Right eye visual fields normal and left eye visual fields normal.  Neck:     Thyroid: No thyromegaly.     Vascular: No carotid bruit or JVD.     Trachea: Trachea normal.  Cardiovascular:     Rate and Rhythm: Normal rate and regular rhythm.     Heart sounds: Normal heart sounds, S1 normal and S2 normal. No murmur heard.    No gallop.  Pulmonary:     Effort: Pulmonary effort is normal. No accessory muscle usage or respiratory distress.     Breath sounds: Normal breath sounds.  Abdominal:     General: Bowel sounds are normal.      Palpations: Abdomen is soft. There is no hepatomegaly or splenomegaly.     Tenderness: There is no abdominal tenderness.  Musculoskeletal:        General: Normal range of motion.     Cervical back: Normal range of motion and neck supple.     Right lower leg: No edema.     Left lower leg: No edema.  Lymphadenopathy:     Head:     Right side of head: No submental, submandibular, tonsillar, preauricular or posterior auricular adenopathy.     Left side of head: No submental, submandibular, tonsillar, preauricular or posterior auricular adenopathy.     Cervical: No cervical adenopathy.  Skin:    General: Skin is warm and dry.     Capillary Refill: Capillary refill takes less than 2 seconds.     Findings: No rash.       Neurological:     Mental Status: He  is alert and oriented to person, place, and time.     Gait: Gait is intact.     Deep Tendon Reflexes: Reflexes are normal and symmetric.     Reflex Scores:      Brachioradialis reflexes are 2+ on the right side and 2+ on the left side.      Patellar reflexes are 2+ on the right side and 2+ on the left side. Psychiatric:        Attention and Perception: Attention normal.        Mood and Affect: Mood normal.        Speech: Speech normal.        Behavior: Behavior normal. Behavior is cooperative.        Thought Content: Thought content normal.        Cognition and Memory: Cognition normal.       01/11/2022   10:04 AM 01/09/2021    9:55 AM 03/10/2019   10:02 AM  6CIT Screen  What Year? 0 points 0 points 0 points  What month? 0 points 0 points 0 points  What time? 0 points 0 points 0 points  Count back from 20 0 points 0 points 0 points  Months in reverse 0 points 0 points 0 points  Repeat phrase 0 points 0 points 0 points  Total Score 0 points 0 points 0 points   Results for orders placed or performed in visit on 03/29/22  Microalbumin, Urine Waived  Result Value Ref Range   Microalb, Ur Waived 10 0 - 19 mg/L   Creatinine,  Urine Waived 50 10 - 300 mg/dL   Microalb/Creat Ratio <30 <30 mg/g      Assessment & Plan:   Problem List Items Addressed This Visit       Cardiovascular and Mediastinum   Aortic atherosclerosis (HCC)    Noted on past imaging.  Recommend he continue daily statin and ASA for prevention + complete cessation of smoking.      Relevant Medications   lisinopril (ZESTRIL) 5 MG tablet   metoprolol tartrate (LOPRESSOR) 25 MG tablet   Other Relevant Orders   Comprehensive metabolic panel   Lipid Panel w/o Chol/HDL Ratio   CAD, multiple vessel    Chronic, stable.  Recommend he schedule follow-up with cardiology.  No recent CP.      Relevant Medications   lisinopril (ZESTRIL) 5 MG tablet   metoprolol tartrate (LOPRESSOR) 25 MG tablet   Other Relevant Orders   Comprehensive metabolic panel   Lipid Panel w/o Chol/HDL Ratio   Chronic systolic heart failure (HCC)    Since STEMI 07/28/20.  Euvolemic.  At this time continue current medication regimen and collaboration with cardiology as needed.  Recommend: - Reminded to call for an overnight weight gain of >2 pounds or a weekly weight gain of >5 pounds - not adding salt to food and read food labels. Reviewed the importance of keeping daily sodium intake to 2000mg  daily.  Highly recommend he cut back on sodium intake as suspect this is causing some edema. - Avoid Ibuprofen products.      Relevant Medications   lisinopril (ZESTRIL) 5 MG tablet   metoprolol tartrate (LOPRESSOR) 25 MG tablet   Other Relevant Orders   Comprehensive metabolic panel   Lipid Panel w/o Chol/HDL Ratio   Essential hypertension    Chronic, stable with BP at goal today.  Recommend he monitor BP at home three mornings a week and document for provider, educated him on this.  Continue diet and exercise focus, DASH diet.  Labs: CBC, CMP, TSH, urine ALB (10 on check today).  Continue current medication regimen and collaboration with cardiology as needed.  Praised for ongoing  adherence to regimen.      Relevant Medications   lisinopril (ZESTRIL) 5 MG tablet   metoprolol tartrate (LOPRESSOR) 25 MG tablet   Other Relevant Orders   Microalbumin, Urine Waived (Completed)   CBC with Differential/Platelet   Comprehensive metabolic panel   TSH   Senile purpura (HCC)    As evidenced by bruising upper extremities with ASA use.  Recommend gentle skin care and monitor for wounds, if present immediately notify provider.      Relevant Medications   lisinopril (ZESTRIL) 5 MG tablet   metoprolol tartrate (LOPRESSOR) 25 MG tablet   Other Relevant Orders   CBC with Differential/Platelet   Stable angina (HCC)    Ongoing with STEMI 07/28/20, at this time will continue collaboration with cardiology as needed and medication regimen as prescribed.  No recent CP.      Relevant Medications   lisinopril (ZESTRIL) 5 MG tablet   metoprolol tartrate (LOPRESSOR) 25 MG tablet   Other Relevant Orders   Comprehensive metabolic panel   Lipid Panel w/o Chol/HDL Ratio     Respiratory   COPD (chronic obstructive pulmonary disease) (HCC)    Chronic, ongoing.  Spirometry FEV1/FVC 90% and FEV1 76% in August 2020 - will repeat next visit.   Recommend continued cessation smoking.  Continue current medication regimen, discussed possible change to Anoro vs Symbicort with assistance will [facility] out to [facility].  He refuses Lung CA Screening CT at this time due to current medical bills, recommended he obtain this.  Return in 6 months with repeat spirometry.      Relevant Orders   CBC with Differential/Platelet     Digestive   Chronic active hepatitis (HCC) - Primary    Chronic, ongoing, currently under treatment and tolerating well.  Atorvastatin on hold until treatment complete.  Continue collaboration with GI.      Relevant Orders   Comprehensive metabolic panel   Lipid Panel w/o Chol/HDL Ratio   Gastroesophageal reflux disease without esophagitis    Chronic, stable with Protonix,  continue this and check Mag level today.      Relevant Orders   Magnesium     Musculoskeletal and Integument   Cutaneous skin tags    To left shoulder and left chest which are causing discomfort and getting red and irritated due to rubbing against clothing.  Plan on removal in 2 weeks.        Other   Former cigarette smoker    Currently continues not to smoke, recommend continue cessation post MI.       History of ST elevation myocardial infarction (STEMI)    Stable at this time, event took place 07/28/20.  Recommend he continue all current medications and collaboration with cardiology.       Relevant Orders   Comprehensive metabolic panel   Lipid Panel w/o Chol/HDL Ratio   HLD (hyperlipidemia)    Chronic, ongoing.  Statin on hold at this time due to Hep C treatment, will restart once completed.  Lipid panel today.      Relevant Medications   lisinopril (ZESTRIL) 5 MG tablet   metoprolol tartrate (LOPRESSOR) 25 MG tablet   Other Relevant Orders   Comprehensive metabolic panel   Lipid Panel w/o Chol/HDL Ratio   Other Visit Diagnoses  Benign prostatic hyperplasia without lower urinary tract symptoms       PSA on labs today.   Relevant Orders   PSA   Encounter for annual physical exam       Annual physical today with labs and health maintenance reviewed, discussed with patient.       Discussed aspirin prophylaxis for myocardial infarction prevention and decision was made to continue ASA  LABORATORY TESTING:  Health maintenance labs ordered today as discussed above.   The natural history of prostate cancer and ongoing controversy regarding screening and potential treatment outcomes of prostate cancer has been discussed with the patient. The meaning of a false positive PSA and a false negative PSA has been discussed. He indicates understanding of the limitations of this screening test and wishes to proceed with screening PSA testing.   IMMUNIZATIONS:   - Tdap:  Tetanus vaccination status reviewed: refuses. - Influenza: Refused - Pneumovax: Up to date - Prevnar: Up to date - Zostavax vaccine: Refused  SCREENING: - Colonoscopy: Refused -- wishes to wait until after Hep C treatment Discussed with patient purpose of the colonoscopy is to detect colon cancer at curable precancerous or early stages   - AAA Screening: Up to date  05/16/20 normal -Hearing Test: Not applicable  -Spirometry: Up to date   PATIENT COUNSELING:    Sexuality: Discussed sexually transmitted diseases, partner selection, use of condoms, avoidance of unintended pregnancy  and contraceptive alternatives.   Advised to avoid cigarette smoking.  I discussed with the patient that most people either abstain from alcohol or drink within safe limits (<=14/week and <=4 drinks/occasion for males, <=7/weeks and <= 3 drinks/occasion for females) and that the risk for alcohol disorders and other health effects rises proportionally with the number of drinks per week and how often a drinker exceeds daily limits.  Discussed cessation/primary prevention of drug use and availability of treatment for abuse.   Diet: Encouraged to adjust caloric intake to maintain  or achieve ideal body weight, to reduce intake of dietary saturated fat and total fat, to limit sodium intake by avoiding high sodium foods and not adding table salt, and to maintain adequate dietary potassium and calcium preferably from fresh fruits, vegetables, and low-fat dairy products.    Stressed the importance of regular exercise  Injury prevention: Discussed safety belts, safety helmets, smoke detector, smoking near bedding or upholstery.   Dental health: Discussed importance of regular tooth brushing, flossing, and dental visits.   Follow up plan: NEXT PREVENTATIVE PHYSICAL DUE IN 1 YEAR. Return in about 2 weeks (around 04/12/2022) for Skin tag removal  + 6 month chronic disease follow-up.

## 2022-03-29 NOTE — Assessment & Plan Note (Signed)
To left shoulder and left chest which are causing discomfort and getting red and irritated due to rubbing against clothing.  Plan on removal in 2 weeks.

## 2022-03-29 NOTE — Assessment & Plan Note (Signed)
As evidenced by bruising upper extremities with ASA use.  Recommend gentle skin care and monitor for wounds, if present immediately notify provider.

## 2022-03-29 NOTE — Assessment & Plan Note (Signed)
Currently continues not to smoke, recommend continue cessation post MI.

## 2022-03-29 NOTE — Assessment & Plan Note (Signed)
Chronic, ongoing.  Statin on hold at this time due to Hep C treatment, will restart once completed.  Lipid panel today.

## 2022-03-30 LAB — CBC WITH DIFFERENTIAL/PLATELET
Basophils Absolute: 0.1 10*3/uL (ref 0.0–0.2)
Basos: 1 %
EOS (ABSOLUTE): 0.4 10*3/uL (ref 0.0–0.4)
Eos: 4 %
Hematocrit: 49.7 % (ref 37.5–51.0)
Hemoglobin: 16.8 g/dL (ref 13.0–17.7)
Immature Grans (Abs): 0 10*3/uL (ref 0.0–0.1)
Immature Granulocytes: 0 %
Lymphocytes Absolute: 3.6 10*3/uL — ABNORMAL HIGH (ref 0.7–3.1)
Lymphs: 39 %
MCH: 32.1 pg (ref 26.6–33.0)
MCHC: 33.8 g/dL (ref 31.5–35.7)
MCV: 95 fL (ref 79–97)
Monocytes Absolute: 1.1 10*3/uL — ABNORMAL HIGH (ref 0.1–0.9)
Monocytes: 11 %
Neutrophils Absolute: 4.2 10*3/uL (ref 1.4–7.0)
Neutrophils: 45 %
Platelets: 283 10*3/uL (ref 150–450)
RBC: 5.24 x10E6/uL (ref 4.14–5.80)
RDW: 12.5 % (ref 11.6–15.4)
WBC: 9.3 10*3/uL (ref 3.4–10.8)

## 2022-03-30 LAB — COMPREHENSIVE METABOLIC PANEL
ALT: 12 IU/L (ref 0–44)
AST: 17 IU/L (ref 0–40)
Albumin/Globulin Ratio: 1.4 (ref 1.2–2.2)
Albumin: 4.4 g/dL (ref 3.9–4.9)
Alkaline Phosphatase: 97 IU/L (ref 44–121)
BUN/Creatinine Ratio: 11 (ref 10–24)
BUN: 10 mg/dL (ref 8–27)
Bilirubin Total: 0.9 mg/dL (ref 0.0–1.2)
CO2: 20 mmol/L (ref 20–29)
Calcium: 9.6 mg/dL (ref 8.6–10.2)
Chloride: 105 mmol/L (ref 96–106)
Creatinine, Ser: 0.9 mg/dL (ref 0.76–1.27)
Globulin, Total: 3.2 g/dL (ref 1.5–4.5)
Glucose: 83 mg/dL (ref 70–99)
Potassium: 4.1 mmol/L (ref 3.5–5.2)
Sodium: 142 mmol/L (ref 134–144)
Total Protein: 7.6 g/dL (ref 6.0–8.5)
eGFR: 93 mL/min/{1.73_m2} (ref >59)

## 2022-03-30 LAB — LIPID PANEL W/O CHOL/HDL RATIO
Cholesterol, Total: 258 mg/dL — ABNORMAL HIGH (ref 100–199)
HDL: 34 mg/dL — ABNORMAL LOW (ref >39)
LDL Chol Calc (NIH): 167 mg/dL — ABNORMAL HIGH (ref 0–99)
Triglycerides: 301 mg/dL — ABNORMAL HIGH (ref 0–149)
VLDL Cholesterol Cal: 57 mg/dL — ABNORMAL HIGH (ref 5–40)

## 2022-03-30 LAB — PSA: Prostate Specific Ag, Serum: 1.5 ng/mL (ref 0.0–4.0)

## 2022-03-30 LAB — TSH: TSH: 0.827 u[IU]/mL (ref 0.450–4.500)

## 2022-03-30 LAB — MAGNESIUM: Magnesium: 2.2 mg/dL (ref 1.6–2.3)

## 2022-03-30 NOTE — Progress Notes (Signed)
Contacted via MyChart   Good evening Onofrio, labs have returned: - CBC shows no anemia or infection - Cholesterol labs are very high without your statin, as soon as finished Hep C treatment then restart statin.   - Kidney function, creatinine and eGFR, remains normal, as is liver function, AST and ALT.   - Thyroid and prostate labs are normal. Any questions?

## 2022-04-02 ENCOUNTER — Telehealth: Payer: Self-pay

## 2022-04-02 NOTE — Telephone Encounter (Signed)
Symbicort PAP denied per patient. He has 8 left.  However, when he read the denial letter it said he'd be automatically renewed for 2024.  Since he has 8 left and a f/u with PCP in March. We will proceed as normal and re-evaluate in March. I will try to renew the PAP in December as planned. If denied, will coordinate with PCP in March (Will have 2 months to get new PAP approved due to excess inhalers)  Also spoke to patient extensively about Covid Vaccine. He told me, "Some nurses told me the reason my breathing was bad is because of the vaccine." Spoke with him about the benefits of the vaccine and how the virus isn't alive. He is going to get his boosted later this month now.

## 2022-04-12 ENCOUNTER — Encounter: Payer: Self-pay | Admitting: Nurse Practitioner

## 2022-04-12 ENCOUNTER — Ambulatory Visit (INDEPENDENT_AMBULATORY_CARE_PROVIDER_SITE_OTHER): Payer: Medicare HMO | Admitting: Nurse Practitioner

## 2022-04-12 ENCOUNTER — Other Ambulatory Visit (HOSPITAL_COMMUNITY)
Admission: RE | Admit: 2022-04-12 | Discharge: 2022-04-12 | Disposition: A | Discharge status: Home / Self Care | Payer: Medicare HMO | Source: Ambulatory Visit | Attending: Nurse Practitioner | Admitting: Nurse Practitioner

## 2022-04-12 VITALS — BP 138/80 | HR 50 | Temp 98.3°F | Wt 189.4 lb

## 2022-04-12 DIAGNOSIS — L918 Other hypertrophic disorders of the skin: Secondary | ICD-10-CM | POA: Diagnosis not present

## 2022-04-12 DIAGNOSIS — L570 Actinic keratosis: Secondary | ICD-10-CM | POA: Diagnosis not present

## 2022-04-12 MED ORDER — SILDENAFIL CITRATE 20 MG PO TABS: 20.0000 mg | ORAL_TABLET | Freq: Every day | ORAL | 2 refills | Status: DC | PRN

## 2022-04-12 NOTE — Patient Instructions (Signed)
Skin Tag, Adult A skin tag (acrochordon) is a soft, extra growth of skin. Most skin tags are skin-colored and rarely bigger than a pencil eraser. They often form in areas where there is frequent rubbing, or friction, on the skin. This may be where there are folds in the skin, such as: The eyelids. The neck. The armpits. The groin. Skin tags are not dangerous, and they do not spread from person to person (are not contagious). You may have one skin tag or many. Skin tags do not need treatment. However, your health care provider may recommend removing a skin tag if it: Gets irritated from clothing or jewelry. Bleeds. Is visible and unsightly. What are the causes? This condition is linked to: Increasing age. Pregnancy. Diabetes. Obesity. What are the signs or symptoms? Skin tags usually do not cause symptoms unless they get irritated by items touching your skin, such as clothing or jewelry. When this happens, you may have pain, itching, or bleeding. How is this diagnosed? This condition is diagnosed with an evaluation from your health care provider. No testing is needed for diagnosis. How is this treated? Treatment for this condition depends on whether you have symptoms. Your health care provider may also remove your skin tag if it is visible or if you do not like the way it looks. A skin tag can be removed by a health care provider with: A simple surgical procedure using scissors. A procedure that involves freezing your skin tag with a gas in liquid form (liquid nitrogen). A procedure that uses heat to destroy your skin tag (electrodessication). Follow these instructions at home: Watch for any changes in your skin tag. A normal skin tag does not require any other special care at home. Take over-the-counter and prescription medicines only as told by your health care provider. Keep all follow-up visits. Contact a health care provider if: You have a skin tag that: Becomes painful. Changes  color. Bleeds. Swells. Summary Skin tags are soft, extra growths of skin found in areas of frequent rubbing or friction. Skin tags usually do not cause symptoms. If symptoms occur, you may have pain, itching, or bleeding. Your health care provider may remove your skin tag if it causes symptoms or if you do not like the way it looks. This information is not intended to replace advice given to you by your health care provider. Make sure you discuss any questions you have with your health care provider. Document Revised: 08/16/2021 Document Reviewed: 08/16/2021 Elsevier Patient Education  2023 Elsevier Inc.  

## 2022-04-12 NOTE — Progress Notes (Signed)
BP 138/80 (BP Location: Left Arm)   Pulse (!) 50   Temp 98.3 F (36.8 C) (Oral)   Wt 189 lb 6.4 oz (85.9 kg)   SpO2 90%   BMI 29.66 kg/m    Subjective:    Patient ID: Andrew Potts, male    DOB: August 20, 1950, 71 y.o.   MRN: [id number]  HPI: Andrew Potts is a 71 y.o. male  Chief Complaint  Patient presents with   Skin Tag    Patient is here for a Skin Tag Removal.    SKIN LESION Has two raised skin tags which are inflamed and causing discomfort to left neck area and left chest.  He is requesting removal of these due to discomfort and irritation to areas. Duration: months Location: left neck and left chest = 4 total Painful: yes Itching: yes Onset: gradual Context: not changing Associated signs and symptoms: irritation and redness History of skin cancer: no History of precancerous skin lesions: no Family history of skin cancer: no   Relevant past medical, surgical, family and social history reviewed and updated as indicated. Interim medical history since our last visit reviewed. Allergies and medications reviewed and updated.  Review of Systems  Constitutional:  Negative for activity change, diaphoresis, fatigue and fever.  Respiratory:  Negative for cough, chest tightness, shortness of breath and wheezing.   Cardiovascular:  Negative for chest pain, palpitations and leg swelling.  Psychiatric/Behavioral: Negative.      Per HPI unless specifically indicated above     Objective:    BP 138/80 (BP Location: Left Arm)   Pulse (!) 50   Temp 98.3 F (36.8 C) (Oral)   Wt 189 lb 6.4 oz (85.9 kg)   SpO2 90%   BMI 29.66 kg/m   Wt Readings from Last 3 Encounters:  04/12/22 189 lb 6.4 oz (85.9 kg)  03/29/22 188 lb 1.6 oz (85.3 kg)  01/01/22 182 lb 9.6 oz (82.8 kg)    Physical Exam Vitals and nursing note reviewed.  Constitutional:      General: He is awake. He is not in acute distress.    Appearance: He is well-developed and well-groomed. He is not  ill-appearing or toxic-appearing.  HENT:     Head: Normocephalic and atraumatic.     Right Ear: Hearing, ear canal and external ear normal. No drainage.     Left Ear: Hearing and external ear normal. No drainage.  Eyes:     General: Lids are normal.        Right eye: No discharge.        Left eye: No discharge.     Conjunctiva/sclera: Conjunctivae normal.     Pupils: Pupils are equal, round, and reactive to light.  Neck:     Vascular: No carotid bruit.  Cardiovascular:     Rate and Rhythm: Normal rate and regular rhythm.     Heart sounds: Normal heart sounds, S1 normal and S2 normal. No murmur heard.    No gallop.  Pulmonary:     Effort: Pulmonary effort is normal. No accessory muscle usage or respiratory distress.     Breath sounds: Normal breath sounds.  Abdominal:     General: Bowel sounds are normal.     Palpations: Abdomen is soft.  Musculoskeletal:        General: Normal range of motion.     Cervical back: Normal range of motion and neck supple.     Right lower leg: No edema.  Left lower leg: No edema.  Skin:    General: Skin is warm and dry.     Capillary Refill: Capillary refill takes less than 2 seconds.     Findings: No rash.       Neurological:     Mental Status: He is alert and oriented to person, place, and time.  Psychiatric:        Attention and Perception: Attention normal.        Mood and Affect: Mood normal.        Speech: Speech normal.        Behavior: Behavior normal. Behavior is cooperative.        Thought Content: Thought content normal.        Cognition and Memory: Cognition normal.    Procedure: Skin tag removal Informed consent:  Discussed risks (permanent scarring, infection, pain, bleeding, bruising, redness, and recurrence of the lesion) and benefits of the procedure, as well as the alternatives.  He is aware that skin tags are benign lesions, and their removal is often not considered medically necessary unless causing discomfort.   Informed consent was obtained. Anesthesia: Lidocaine 2% with epi  The area was prepared and draped in a standard fashion. Snip and Dermablade removal was performed.   Antibiotic ointment and a sterile dressing were applied.   The patient tolerated procedure well. The patient was instructed on post-op care.   Number of lesions removed:  4   Results for orders placed or performed in visit on 03/29/22  Microalbumin, Urine Waived  Result Value Ref Range   Microalb, Ur Waived 10 0 - 19 mg/L   Creatinine, Urine Waived 50 10 - 300 mg/dL   Microalb/Creat Ratio <30 <30 mg/g  CBC with Differential/Platelet  Result Value Ref Range   WBC 9.3 3.4 - 10.8 x10E3/uL   RBC 5.24 4.14 - 5.80 x10E6/uL   Hemoglobin 16.8 13.0 - 17.7 g/dL   Hematocrit 49.7 37.5 - 51.0 %   MCV 95 79 - 97 fL   MCH 32.1 26.6 - 33.0 pg   MCHC 33.8 31.5 - 35.7 g/dL   RDW 12.5 11.6 - 15.4 %   Platelets 283 150 - 450 x10E3/uL   Neutrophils 45 Not Estab. %   Lymphs 39 Not Estab. %   Monocytes 11 Not Estab. %   Eos 4 Not Estab. %   Basos 1 Not Estab. %   Neutrophils Absolute 4.2 1.4 - 7.0 x10E3/uL   Lymphocytes Absolute 3.6 (H) 0.7 - 3.1 x10E3/uL   Monocytes Absolute 1.1 (H) 0.1 - 0.9 x10E3/uL   EOS (ABSOLUTE) 0.4 0.0 - 0.4 x10E3/uL   Basophils Absolute 0.1 0.0 - 0.2 x10E3/uL   Immature Granulocytes 0 Not Estab. %   Immature Grans (Abs) 0.0 0.0 - 0.1 x10E3/uL  Comprehensive metabolic panel  Result Value Ref Range   Glucose 83 70 - 99 mg/dL   BUN 10 8 - 27 mg/dL   Creatinine, Ser 0.90 0.76 - 1.27 mg/dL   eGFR 93 >59 mL/min/1.73   BUN/Creatinine Ratio 11 10 - 24   Sodium 142 134 - 144 mmol/L   Potassium 4.1 3.5 - 5.2 mmol/L   Chloride 105 96 - 106 mmol/L   CO2 20 20 - 29 mmol/L   Calcium 9.6 8.6 - 10.2 mg/dL   Total Protein 7.6 6.0 - 8.5 g/dL   Albumin 4.4 3.9 - 4.9 g/dL   Globulin, Total 3.2 1.5 - 4.5 g/dL   Albumin/Globulin Ratio 1.4 1.2 - 2.2  Bilirubin Total 0.9 0.0 - 1.2 mg/dL   Alkaline Phosphatase 97 44 -  121 IU/L   AST 17 0 - 40 IU/L   ALT 12 0 - 44 IU/L  Lipid Panel w/o Chol/HDL Ratio  Result Value Ref Range   Cholesterol, Total 258 (H) 100 - 199 mg/dL   Triglycerides 301 (H) 0 - 149 mg/dL   HDL 34 (L) >39 mg/dL   VLDL Cholesterol Cal 57 (H) 5 - 40 mg/dL   LDL Chol Calc (NIH) 167 (H) 0 - 99 mg/dL  TSH  Result Value Ref Range   TSH 0.827 0.450 - 4.500 uIU/mL  PSA  Result Value Ref Range   Prostate Specific Ag, Serum 1.5 0.0 - 4.0 ng/mL  Magnesium  Result Value Ref Range   Magnesium 2.2 1.6 - 2.3 mg/dL      Assessment & Plan:   Problem List Items Addressed This Visit       Musculoskeletal and Integument   Skin tags, multiple acquired - Primary    To left neck and left chest 4 total which are causing discomfort and getting red and irritated due to rubbing against clothing.  All skin tags removed today after patient consent and sent to pathology for review.  Educated patient on post procedure care.  Tolerated well.      Relevant Orders   Surgical pathology     Follow up plan: Return for as scheduled in March.

## 2022-04-12 NOTE — Assessment & Plan Note (Addendum)
To left neck and left chest 4 total which are causing discomfort and getting red and irritated due to rubbing against clothing.  All skin tags removed today after patient consent and sent to pathology for review.  Educated patient on post procedure care.  Tolerated well.

## 2022-04-13 ENCOUNTER — Telehealth: Payer: Self-pay | Admitting: Nurse Practitioner

## 2022-04-13 NOTE — Telephone Encounter (Signed)
Spoke with Alyse Low from Pathology and she says they will just send the specimen back and along with a correction slip. Once received specimen will just need patient's label attached and sign the slip in the package and sent it back to pathology per [facility] with pathology.

## 2022-04-13 NOTE — Telephone Encounter (Signed)
Christy calling from Pathology is calling return to specimen because it does not have a label. CB- [phone]

## 2022-04-17 NOTE — Telephone Encounter (Signed)
Specimen received and label was placed on container and specimen was placed back in bin to sent back to [facility] Pathology.

## 2022-04-18 LAB — SURGICAL PATHOLOGY

## 2022-04-18 NOTE — Progress Notes (Signed)
Contacted via [place]   Your skin tags returned and are nice and benign (no cancer).

## 2022-05-07 ENCOUNTER — Ambulatory Visit (INDEPENDENT_AMBULATORY_CARE_PROVIDER_SITE_OTHER): Payer: Medicare HMO

## 2022-05-07 DIAGNOSIS — I10 Essential (primary) hypertension: Secondary | ICD-10-CM

## 2022-05-07 DIAGNOSIS — E782 Mixed hyperlipidemia: Secondary | ICD-10-CM

## 2022-05-07 DIAGNOSIS — J449 Chronic obstructive pulmonary disease, unspecified: Secondary | ICD-10-CM

## 2022-05-07 NOTE — Patient Instructions (Signed)
Visit Information   Goals Addressed   None    Patient Care Plan: ccm pharmacy care plan     Problem Identified: hld htn copd chf   Priority: High     Long-Range Goal: disease management   Start Date: 05/15/2021  Expected End Date: 05/15/2022  Recent Progress: On track  Priority: High  Note:   Current Barriers:  Unable to independently afford treatment regimen  Pharmacist Clinical Goal(s):  Patient will verbalize ability to afford treatment regimen contact provider office for questions/concerns as evidenced notation of same in electronic health record pursue patient assistance as appropriate through collaboration with PharmD and provider.   Interventions: 1:1 collaboration with Venita Lick, NP regarding development and update of comprehensive plan of care as evidenced by provider attestation and co-signature Inter-disciplinary care team collaboration (see longitudinal plan of care) Comprehensive medication review performed; medication list updated in electronic medical record  Hypertension and Systolic Heart Failure (BP goal <130/80) BP Readings from Last 3 Encounters:  04/12/22 138/80  03/29/22 132/72  01/01/22 126/79  -Controlled -Most recent change is furosemide 10 mg daily d/t swelling.  -heart attack January 2022 - two stents placed. Last echo EF 55-60% -Current treatment: Lisinopril 5 mg once daily Appropriate, Effective, Safe, Accessible Metoprolol tartrate 25 mg tab - half tab twice daily Appropriate, Effective, Safe, Accessible Furosemide 10 mg daily for swelling Appropriate, Effective, Safe, Accessible -Current home readings: not testing -Current dietary habits: lower carb now, less processed and fried foods after heart attack -Counseled on salt restrictions and daily weights. Reads labels, trying to cut down on hot dogs.  -Current exercise habits: joined [employer] since last phone visit 40m ago. Also doing dumbbell exercises at home. 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  -Denies hypotensive/hypertensive symptoms -Educated on BP goals and benefits of medications for prevention of heart attack, stroke and kidney damage; -Counseled to monitor BP at home as directed, document, and provide log at future appointments. Counsled on cuff selection - recommend cuff for bicep. Insurance has otc benefit that he can use October 2023: Has BP monitor but gets too tight on his arm. Recommended he bring into office so we can check if it's working or not  Hyperlipidemia: (LDL goal < 55-70) The ASCVD Risk score (Arnett DK, et al., 2019) failed to calculate for the following reasons:   The patient has a prior MI or stroke diagnosis Lab Results  Component Value Date   CHOL 258 (H) 03/29/2022   CHOL 130 05/10/2021   CHOL 105 09/21/2020   Lab Results  Component Value Date   HDL 34 (L) 03/29/2022   HDL 33 (L) 05/10/2021   HDL 40 (L) 09/21/2020   Lab Results  Component Value Date   LDLCALC 167 (H) 03/29/2022   LDLCALC 76 05/10/2021   LDLCALC 54 09/21/2020   Lab Results  Component Value Date   TRIG 301 (H) 03/29/2022   TRIG 115 05/10/2021   TRIG 56 09/21/2020   Lab Results  Component Value Date   CHOLHDL 2.6 09/21/2020   CHOLHDL 4.0 07/29/2020  No results found for: "LDLDIRECT" Last vitamin D No results found for: "25OHVITD2", "25OHVITD3", "VD25OH" Lab Results  Component Value Date   TSH 0.827 03/29/2022  -Not ideally controlled, last LDL in 70s -s/p MI DESx2 Jan 2022 -Current treatment: Atorvastatin 80 mg once daily Appropriate, Effective, Safe, Accessible -Medications previously tried: n/a  -Educated on Cholesterol goals;  Benefits of  statin for ASCVD risk reduction; -Counseled on diet and exercise extensively October 2023: Patient stopped taking for a few months due to Hep C treatment. Started back on last week. Lipids were at goal previously while on it, should be fine once gets back  to Steady State  COPD (Goal: control symptoms and prevent exacerbations) -Controlled -Quit smoking 2022 after heart attack -Current treatment  Albuterol inhaler prn Appropriate, Effective, Safe, Accessible Symbicort - approved for PAP 2023 Appropriate, Effective, Safe, Accessible -Gold Grade: Gold 2 (FEV1 50-79%) -Current COPD Classification:  A (low sx, <2 exacerbations/yr) -Previous CAT score: 8 - no updates 10/2021     No data to display        -Pulmonary function testing: 2020 -Exacerbations requiring treatment in last 6 months: 0 -Patient reports consistent use of maintenance inhaler -Frequency of rescue inhaler use: prn -Counseled on Proper inhaler technique; Benefits of consistent maintenance inhaler use October 2023: Symbicort PAP no longer available in 2024 (Going generic). Sent direct msg to PCP, would like patient to try Anoro. Started PAP  Patient Goals/Self-Care Activities Patient will:  - target a minimum of 150 minutes of moderate intensity exercise weekly  Medication Assistance: Anoro PAP started for 2024  Patient's preferred pharmacy is:  [place], [place] - [address] [place] [address] Phone: [phone] Fax: [phone]   Pt endorses 100% compliance  Follow Up:  Feb 2024  [place], [place].D. - [phone]      Mr. Bendorf was given information about Chronic Care Management services today including:  CCM service includes personalized support from designated clinical staff supervised by his physician, including individualized plan of care and coordination with other care providers 24/7 contact phone numbers for assistance for urgent and routine care needs. Standard insurance, coinsurance, copays and deductibles apply for chronic care management only during months in which we provide at least 20 minutes of these services. Most insurances cover these services at 100%, however patients may be responsible for  any copay, coinsurance and/or deductible if applicable. This service may help you avoid the need for more expensive face-to-face services. Only one practitioner may furnish and bill the service in a calendar month. The patient may stop CCM services at any time (effective at the end of the month) by phone call to the office staff.  Patient agreed to services and verbal consent obtained.   The patient verbalized understanding of instructions, educational materials, and care plan provided today and DECLINED offer to receive copy of patient instructions, educational materials, and care plan.  The pharmacy team will reach out to the patient again over the next 60 days.   Lane Hacker, [place]
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
## 2022-05-15 DIAGNOSIS — I5020 Unspecified systolic (congestive) heart failure: Secondary | ICD-10-CM

## 2022-05-15 DIAGNOSIS — J449 Chronic obstructive pulmonary disease, unspecified: Secondary | ICD-10-CM

## 2022-05-15 DIAGNOSIS — E785 Hyperlipidemia, unspecified: Secondary | ICD-10-CM

## 2022-05-15 DIAGNOSIS — I110 Hypertensive heart disease with heart failure: Secondary | ICD-10-CM

## 2022-05-17 ENCOUNTER — Telehealth: Payer: Self-pay | Admitting: Nurse Practitioner

## 2022-05-17 NOTE — Telephone Encounter (Signed)
Spoke with patient and made him aware of Jolene's recommendations. Patient says he would let his insurance agent know. Patient asked if the 3 of the inhalers had any generics at this time. Advised patient those were all brand names. Patient says he would let his insurance agent know but thinks it may be an issue. Patient would like to think Jolene so much for her help.

## 2022-05-17 NOTE — Telephone Encounter (Signed)
Noted  

## 2022-05-17 NOTE — Telephone Encounter (Signed)
Pt states his insurance agent called him yesterday to discuss his Symbicort and switching to generic.  But pt has been getting this med for free and he has plenty on hand. he states Jolene did mention switching from Symbicort to an alternate medication. He states his insurance agent would like to know what her plans are for switching to another medication, so she will know which plan to put him on. Pt would like call back to advise.

## 2022-05-21 ENCOUNTER — Other Ambulatory Visit: Payer: Self-pay | Admitting: Surgery

## 2022-05-23 DIAGNOSIS — E782 Mixed hyperlipidemia: Secondary | ICD-10-CM | POA: Diagnosis not present

## 2022-05-23 DIAGNOSIS — I10 Essential (primary) hypertension: Secondary | ICD-10-CM | POA: Diagnosis not present

## 2022-05-23 DIAGNOSIS — I2510 Atherosclerotic heart disease of native coronary artery without angina pectoris: Secondary | ICD-10-CM | POA: Diagnosis not present

## 2022-05-24 ENCOUNTER — Other Ambulatory Visit: Payer: Self-pay | Admitting: Nurse Practitioner

## 2022-05-24 DIAGNOSIS — I10 Essential (primary) hypertension: Secondary | ICD-10-CM

## 2022-05-24 DIAGNOSIS — I5022 Chronic systolic (congestive) heart failure: Secondary | ICD-10-CM

## 2022-05-24 DIAGNOSIS — J449 Chronic obstructive pulmonary disease, unspecified: Secondary | ICD-10-CM

## 2022-05-25 IMAGING — NM NM HEPATO W/GB/PHARM/[PERSON_NAME]
3 series · 18 of 18 positions shown · non-contrast
Comparison: Right upper quadrant abdominal ultrasound-09/20/2020;
CT abdomen pelvis-09/20/2020

CLINICAL DATA: Cholelithiasis. Please perform nuclear medicine HIDA
scan to evaluate for cystic duct occlusion.

EXAM:
NUCLEAR MEDICINE HEPATOBILIARY IMAGING
TECHNIQUE: Sequential images of the abdomen were obtained [DATE] minutes
following intravenous administration of radiopharmaceutical.
RADIOPHARMACEUTICALS:  4.7 mCi Jc-EEm  Choletec IV
MEDICATIONS:
Morphine 2.9 mg IV, administered for HIDA scan augmentation given
nonvisualization of the gallbladder after 1 hour of imaging.

[Series 1000: hepatobiliary scan post morphine · 9.59mm/px · 6 of 30 frames shown]
[frame 3/30]
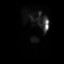
[frame 8/30]
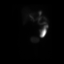
[frame 13/30]
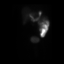
[frame 18/30]
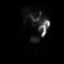
[frame 23/30]
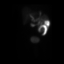
[frame 28/30]
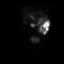

[Series 1000: hepatobiliary scan hr 2 · 9.59mm/px · 6 of 60 frames shown]
[frame 6/60]
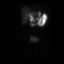
[frame 16/60]
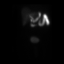
[frame 26/60]
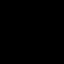
[frame 36/60]
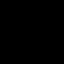
[frame 46/60]
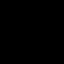
[frame 56/60]
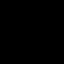

[Series 1000: hepatobiliary scan · 9.59mm/px · 6 of 60 frames shown]
[frame 6/60]
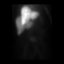
[frame 16/60]
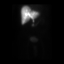
[frame 26/60]
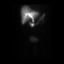
[frame 36/60]
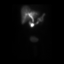
[frame 46/60]
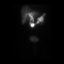
[frame 56/60]
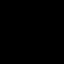

[18 of 18 positions shown; findings below may reference images not displayed]

FINDINGS: There is homogeneous distribution of injected radiotracer throughout
the hepatic parenchyma. There is early excretion of radiotracer with
opacification of the proximal small bowel, initially seen on the 15
minute anterior projection planar image. Despite imaging for 1 hour,
there is nonvisualization of the gallbladder and as such the
examination was augmented with the administration of intravenous
morphine.

Despite the administration of morphine and imaging for an additional
30 minutes, there is continued nonvisualization of the gallbladder,
findings suggestive of cystic duct occlusion.
IMPRESSION: Nonvisualization of the gallbladder, despite examination
augmentation with morphine, findings suggestive of cystic duct
occlusion/acute cholecystitis.

These results will be called to the ordering clinician or
representative by the Radiologist Assistant, and communication
documented in the PACS or [REDACTED].

## 2022-05-26 IMAGING — CT CT IMAGE GUIDED DRAINAGE BY PERCUTANEOUS CATHETER
1 of 2 series · 14 of 32 positions shown, 19 images · non-contrast
Comparison: none

INDICATION: Acute cholecystitis

[Series 2: i-spiral 5.0 b30f · axial · 0.72mm/px · z∈[-154,-22]mm · 14 of 44 slices shown, 19 images]
[im 3/44  soft-tissue]
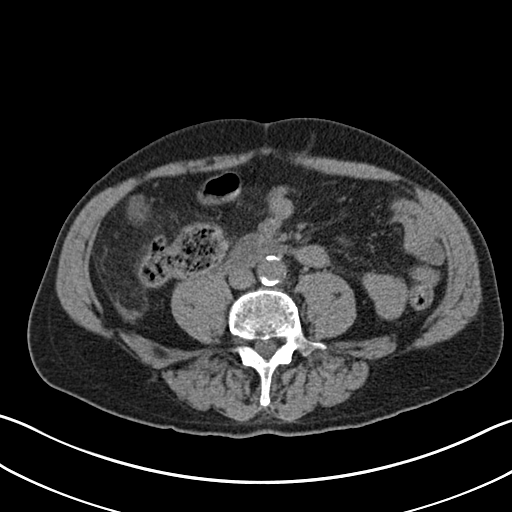
[im 3/44  bone]
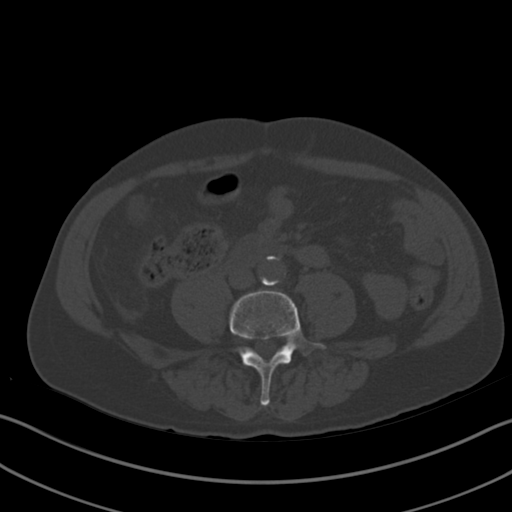
[im 7/44  soft-tissue]
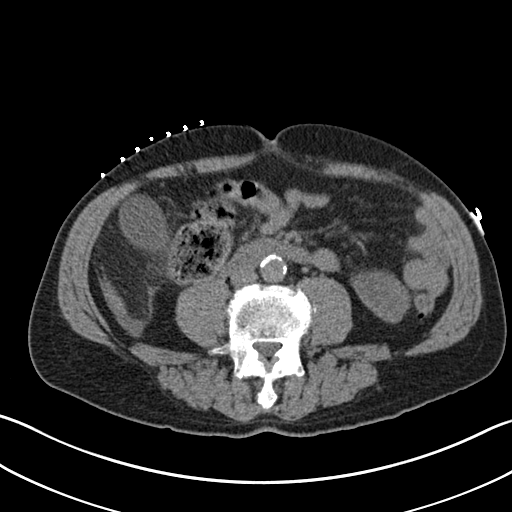
[im 9/44  soft-tissue]
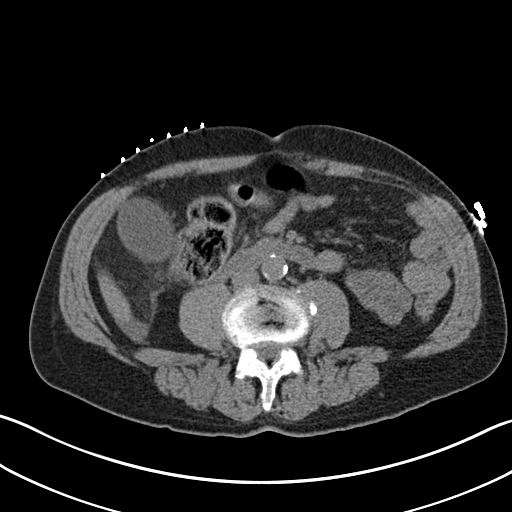
[im 13/44  soft-tissue]
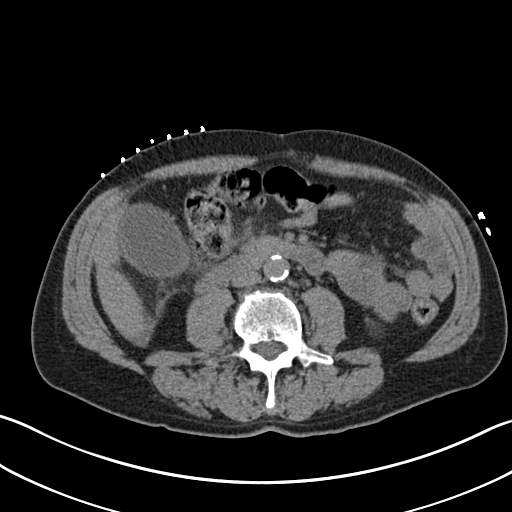
[im 16/44  soft-tissue]
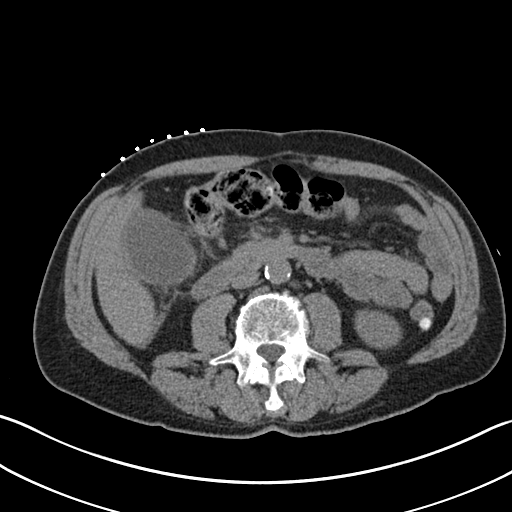
[im 20/44  soft-tissue]
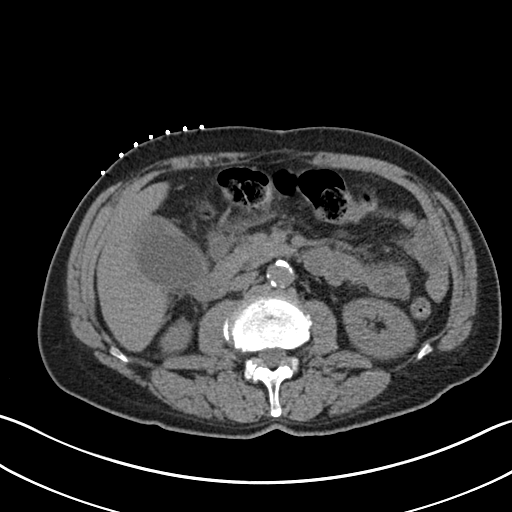
[im 22/44  soft-tissue]
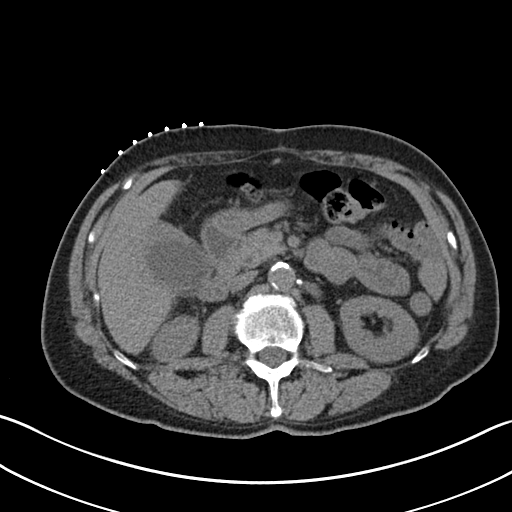
[im 24/44  soft-tissue]
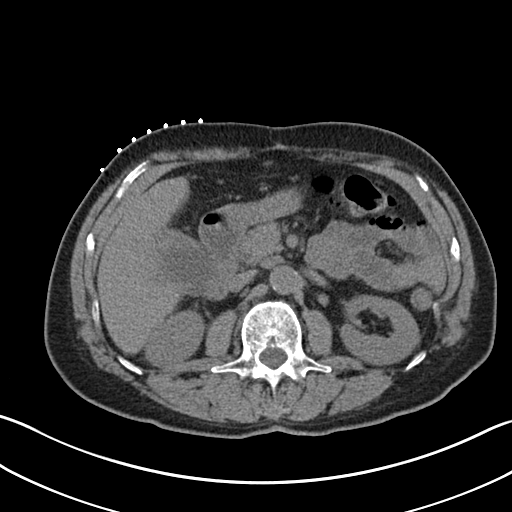
[im 28/44  soft-tissue]
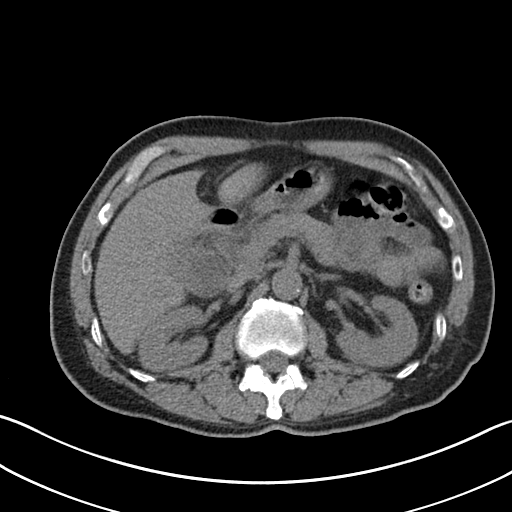
[im 28/44  bone]
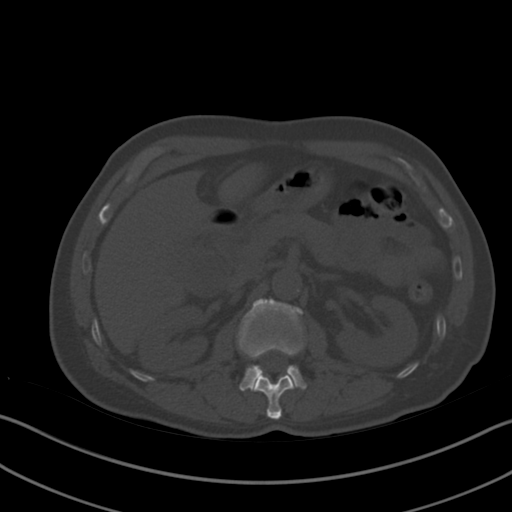
[im 31/44  soft-tissue]
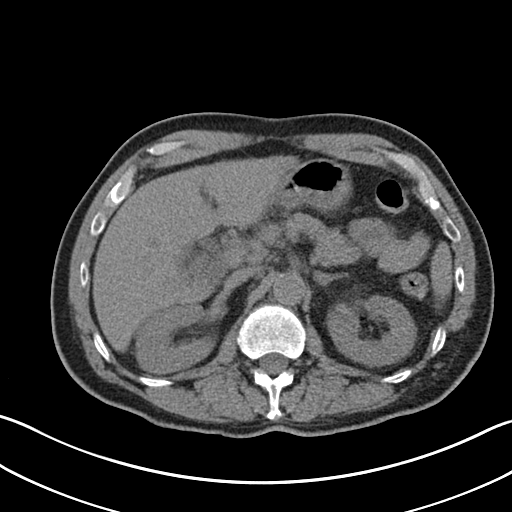
[im 35/44  soft-tissue]
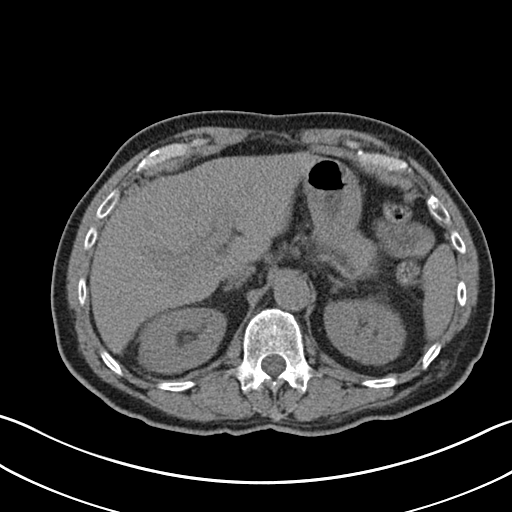
[im 35/44  lung]
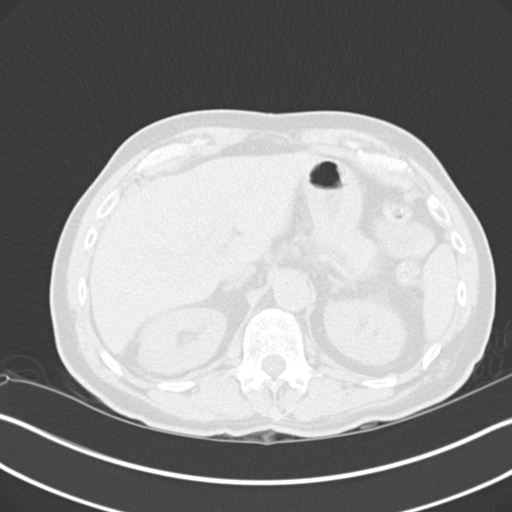
[im 37/44  soft-tissue]
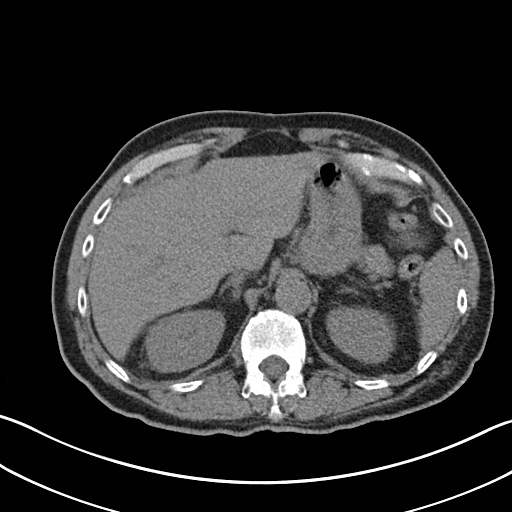
[im 37/44  lung]
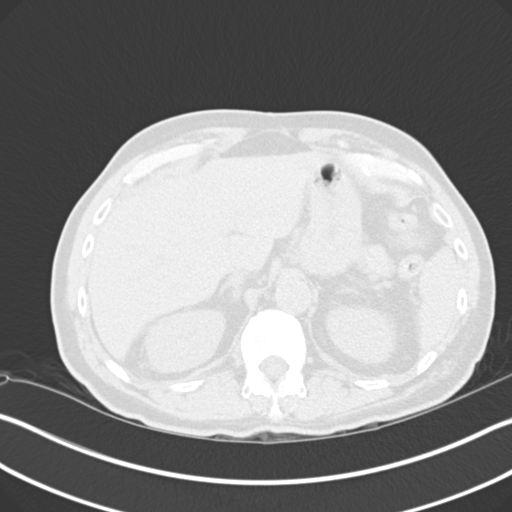
[im 39/44  lung]
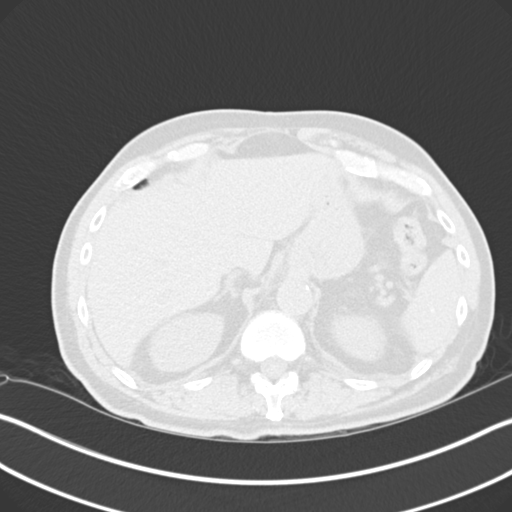
[im 41/44  soft-tissue]
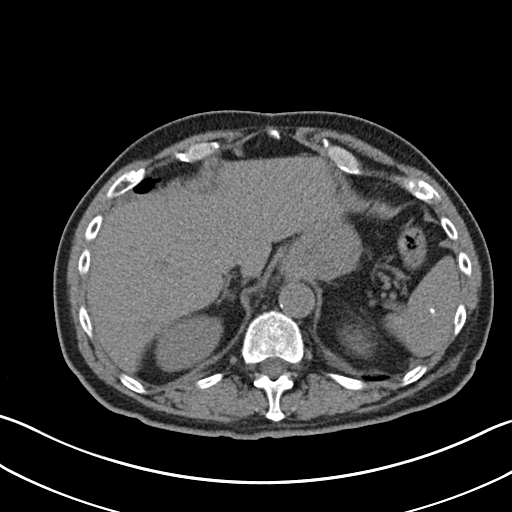
[im 41/44  lung]
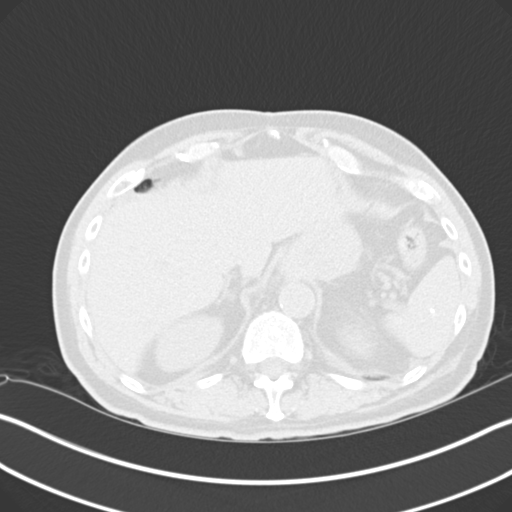

[14 of 32 positions shown; findings below may reference images not displayed]

EXAM:
CT-GUIDED PERCUTANEOUS TRANSHEPATIC CHOLECYSTOSTOMY

MEDICATIONS:
The patient is currently admitted to the hospital and receiving
intravenous antibiotics. The antibiotics were administered within an
appropriate time frame prior to the initiation of the procedure.

ANESTHESIA/SEDATION:
Fentanyl 100 mcg IV; Versed 2.0 mg IV

Moderate Sedation Time:  10 MINUTES

The patient was continuously monitored during the procedure by the
interventional radiology nurse under my direct supervision.

COMPLICATIONS:
None immediate.

PROCEDURE:
Informed written consent was obtained from the patient after a
thorough discussion of the procedural risks, benefits and
alternatives. All questions were addressed. Maximal Sterile Barrier
Technique was utilized including caps, mask, sterile gowns, sterile
gloves, sterile drape, hand hygiene and skin antiseptic. A timeout
was performed prior to the initiation of the procedure.

Previous imaging reviewed. Patient positioned supine. Noncontrast
localization CT performed. The gallbladder was localized in the
right subcostal area for a transhepatic approach.

Under sterile conditions and local anesthesia, an 18 gauge 10 cm
access was advanced from a percutaneous transhepatic approach into
the gallbladder. Needle position confirmed with CT. Guidewire
inserted followed by tract dilatation to insert a 10 French
catheter. Retention loop formed in the gallbladder. Position
confirmed with CT.

Patient tolerated the procedure well.  No immediate complication.
IMPRESSION: Successful CT-guided percutaneous transhepatic cholecystostomy

## 2022-06-04 ENCOUNTER — Telehealth: Payer: Self-pay

## 2022-06-04 NOTE — Progress Notes (Signed)
  Chronic Care Management   Note  06/04/2022 Name: Andrew Potts MRN: [id number] DOB: [birth year]  Andrew Potts is a 71 y.o. year old male who is a primary care patient of [name], Dorie Rank, NP. I reached out to [employer] by phone today in response to a referral sent by Andrew Potts PCP.  Andrew Potts  agreedto scheduling an appointment with the CCM RN Case Manager   Follow up plan: Patient agreed to scheduled appointment with RN Case Manager on 06/13/2022(date/time).   Andrew Potts, RMA Care Guide [facility]  [place], [place] [address] Direct Dial: [phone] [email or web address]

## 2022-06-13 ENCOUNTER — Ambulatory Visit (INDEPENDENT_AMBULATORY_CARE_PROVIDER_SITE_OTHER): Payer: Medicare HMO

## 2022-06-13 ENCOUNTER — Telehealth: Payer: Medicare HMO

## 2022-06-13 DIAGNOSIS — I10 Essential (primary) hypertension: Secondary | ICD-10-CM

## 2022-06-13 DIAGNOSIS — J449 Chronic obstructive pulmonary disease, unspecified: Secondary | ICD-10-CM

## 2022-06-13 DIAGNOSIS — I5022 Chronic systolic (congestive) heart failure: Secondary | ICD-10-CM

## 2022-06-13 NOTE — Chronic Care Management (AMB) (Signed)
  Chronic Care Management   CCM RN Visit Note  06/13/2022 Name: Andrew Potts MRN: [id number] DOB: 05-Aug-1950  Subjective: Andrew Potts is a 71 y.o. year old male who is a primary care patient of [name], Dorie Rank, NP. The patient was referred to the Chronic Care Management team for assistance with care management needs subsequent to provider initiation of CCM services and plan of care.    Today's Visit:  Engaged with patient by telephone for initial visit.       See comprehensive plan of care for details.   Plan:Telephone follow up appointment with care management team member scheduled for:  07-26-2022 at 1145 am  Alto Denver RN, MSN, CCM RN Care Manager  Chronic Care Management Direct Number: [phone]

## 2022-06-13 NOTE — Plan of Care (Signed)
Chronic Care Management Provider Comprehensive Care Plan    06/13/2022 Name: Andrew Potts MRN: [id number] DOB: Dec 09, 1950  Referral to Chronic Care Management (CCM) services was placed by Provider:  Aura Dials on Date: 05-24-2022.  Chronic Condition 1: COPD Provider Assessment and Plan  Chronic, ongoing.  Spirometry FEV1/FVC 90% and FEV1 76% in August 2020 - will repeat next visit.   Recommend continued cessation smoking.  Continue current medication regimen, discussed possible change to Anoro vs Symbicort with assistance will reach out to [facility].  He refuses Lung CA Screening CT at this time due to current medical bills, recommended he obtain this.  Return in 6 months with repeat spirometry.         Relevant Orders    CBC with Differential/Platelet     Expected Outcome/Goals Addressed This Visit (Provider CCM goals/Provider Assessment and plan   Goal: CCM (COPD) EXPECTED OUTCOME: MONITOR, SELF-MANAGE AND REDUCE SYMPTOMS OF COPD    Symptom Management Condition 1: Take all medications as prescribed Attend all scheduled provider appointments Call provider office for new concerns or questions  call the Suicide and Crisis Lifeline: 988 call the [place] National Suicide Prevention Lifeline: (651)414-3630 or TTY: 3641743571 TTY 206-246-2850) to talk to a trained counselor call 1-800-273-TALK (toll free, 24 hour hotline) if experiencing a Mental Health or Behavioral Health Crisis  identify and remove indoor air pollutants limit outdoor activity during cold weather listen for public air quality announcements every day do breathing exercises every day develop a rescue plan eliminate symptom triggers at home follow rescue plan if symptoms flare-up  Chronic Condition 2: HTN Provider Assessment and Plan   Chronic, stable with BP at goal today.  Recommend he monitor BP at home three mornings a week and document for provider, educated him on this.  Continue diet and exercise  focus, DASH diet.  Labs: CBC, CMP, TSH, urine ALB (10 on check today).  Continue current medication regimen and collaboration with cardiology as needed.  Praised for ongoing adherence to regimen.         Relevant Medications    lisinopril (ZESTRIL) 5 MG tablet    metoprolol tartrate (LOPRESSOR) 25 MG tablet    Other Relevant Orders    Microalbumin, Urine Waived (Completed)    CBC with Differential/Platelet    Comprehensive metabolic panel    TSH     Expected Outcome/Goals Addressed This Visit (Provider CCM goals/Provider Assessment and plan   CCM (HYPERTENSION)  EXPECTED OUTCOME:  MONITOR,SELF- MANAGE AND REDUCE SYMPTOMS OF HYPERTENSION   Symptom Management Condition 2: Take all medications as prescribed Call pharmacy for medication refills 3-7 days in advance of running out of medications Call provider office for new concerns or questions  call the Suicide and Crisis Lifeline: 988 call the [place] National Suicide Prevention Lifeline: 9375522077 or TTY: 478 617 7258 TTY 6173586041) to talk to a trained counselor call 1-800-273-TALK (toll free, 24 hour hotline) if experiencing a Mental Health or Behavioral Health Crisis  check blood pressure 3 times per week write blood pressure results in a log or diary learn about high blood pressure keep a blood pressure log take blood pressure log to all doctor appointments call doctor for signs and symptoms of high blood pressure keep all doctor appointments take medications for blood pressure exactly as prescribed report new symptoms to your doctor  Chronic Condition 3: Heart Failure Provider Assessment and Plan   Since STEMI 07/28/20.  Euvolemic.  At this time continue current medication regimen and collaboration with  cardiology as needed.  Recommend: - Reminded to call for an overnight weight gain of >2 pounds or a weekly weight gain of >5 pounds - not adding salt to food and read food labels. Reviewed the importance of keeping  daily sodium intake to 2000mg  daily.  Highly recommend he cut back on sodium intake as suspect this is causing some edema. - Avoid Ibuprofen products.         Relevant Medications    lisinopril (ZESTRIL) 5 MG tablet    metoprolol tartrate (LOPRESSOR) 25 MG tablet Other Relevant Orders     Comprehensive metabolic panel    Lipid Panel w/o Chol/HDL Ratio        Expected Outcome/Goals Addressed This Visit (Provider CCM goals/Provider Assessment and plan   CCM (HEART FAILURE)  EXPECTED OUTCOME:  MONITOR, SELF-MANAGE AND REDUCE SYMPTOMS OF HEART FAILURE  Symptom Management Condition 3: Take all medications as prescribed Attend all scheduled provider appointments Call provider office for new concerns or questions  call the Suicide and Crisis Lifeline: 988 call the [place] National Suicide Prevention Lifeline: (302)249-9964 or TTY: 954-082-7616 TTY 281-269-5805) to talk to a trained counselor call 1-800-273-TALK (toll free, 24 hour hotline) if experiencing a Mental Health or Behavioral Health Crisis  call office if I gain more than 2 pounds in one day or 5 pounds in one week track weight in diary use salt in moderation watch for swelling in feet, ankles and legs every day weigh myself daily begin a heart failure diary bring diary to all appointments develop a rescue plan follow rescue plan if symptoms flare-up know when to call the doctor:changes in swelling/Edema or acute changes in Sx and Sx of HF track symptoms and what helps feel better or worse dress right for the weather, hot or cold   Problem List Patient Active Problem List   Diagnosis Date Noted   Skin tags, multiple acquired 03/29/2022   Gastroesophageal reflux disease without esophagitis 05/10/2021   Chronic systolic heart failure (HCC) 12/29/2020   S/P laparoscopic cholecystectomy 12/15/2020   Aortic atherosclerosis (HCC) 10/11/2020   Senile purpura (HCC) 09/28/2020   Stable angina 09/20/2020   CAD, multiple  vessel 09/20/2020   History of ST elevation myocardial infarction (STEMI) 07/28/2020   HLD (hyperlipidemia) 07/28/2020   Chronic active hepatitis (HCC) 03/11/2019   Erectile dysfunction 02/04/2019   COPD (chronic obstructive pulmonary disease) (HCC) 02/04/2019   Former cigarette smoker 02/04/2019   Essential hypertension 02/04/2019    Medication Management  Current Outpatient Medications:    albuterol (VENTOLIN HFA) 108 (90 Base) MCG/ACT inhaler, Inhale 2 puffs into the lungs every 6 (six) hours as needed for wheezing or shortness of breath., Disp: 18 g, Rfl: 5   aspirin EC 81 MG EC tablet, Take 1 tablet (81 mg total) by mouth daily. Swallow whole., Disp: 30 tablet, Rfl: 11   atorvastatin (LIPITOR) 80 MG tablet, Take 1 tablet (80 mg total) by mouth daily., Disp: 90 tablet, Rfl: 4   Ensure (ENSURE), Take 237 mLs by mouth daily., Disp: , Rfl:    EPCLUSA 400-100 MG TABS, Take 1 tablet by mouth daily., Disp: , Rfl:    furosemide (LASIX) 20 MG tablet, Take 0.5 tablets (10 mg total) by mouth daily., Disp: 45 tablet, Rfl: 4   lisinopril (ZESTRIL) 5 MG tablet, Take 1 tablet (5 mg total) by mouth daily. (Patient taking differently: Take 20 mg by mouth daily. 05-23-2022 the cardiologist increased the Lisinopril), Disp: 90 tablet, Rfl: 4   metoprolol tartrate (LOPRESSOR)  25 MG tablet, Take 0.5 tablets (12.5 mg total) by mouth 2 (two) times daily., Disp: 90 tablet, Rfl: 4   pantoprazole (PROTONIX) 40 MG tablet, Take 1 tablet (40 mg total) by mouth daily., Disp: 90 tablet, Rfl: 0   sildenafil (REVATIO) 20 MG tablet, Take 1 tablet (20 mg total) by mouth daily as needed (erectile dysfunction)., Disp: 10 tablet, Rfl: 2   SYMBICORT 160-4.5 MCG/ACT inhaler, Inhale 2 puffs into the lungs 2 (two) times daily., Disp: 10.2 g, Rfl: 5  Cognitive Assessment Identity Confirmed: : Name; DOB Cognitive Status: Normal   Functional Assessment Hearing Difficulty or Deaf: no Wear Glasses or Blind: yes Vision  Management: Wears glasses Concentrating, Remembering or Making Decisions Difficulty (CP): no Difficulty Communicating: no Difficulty Eating/Swallowing: no Walking or Climbing Stairs Difficulty: no Dressing/Bathing Difficulty: no Doing Errands Independently Difficulty (such as shopping) (CP): no Change in Functional Status Since Onset of Current Illness/Injury: no   Caregiver Assessment  Primary Source of Support/Comfort: child(ren); friend Name of Support/Comfort Primary Source: Daughter - Leah Welborn People in Home: alone Family Caregiver if Needed: child(ren), adult Family Caregiver Names: Ranard Harte- daughter Primary Roles/Responsibilities: retired   Planned Interventions  Basic overview and discussion of pathophysiology of Heart Failure reviewed Provided education on low sodium diet. Review and education provided Reviewed Heart Failure Action Plan in depth and provided written copy Assessed need for readable accurate scales in home. Has scales, weighs daily, weight stays around 188 Provided education about placing scale on hard, flat surface Advised patient to weigh each morning after emptying bladder Discussed importance of daily weight and advised patient to weigh and record daily Reviewed role of diuretics in prevention of fluid overload and management of heart failure Discussed the importance of keeping all appointments with provider Provided patient with education about the role of exercise in the management of heart failure Advised patient to discuss changes in weight, changes in swelling/edema in feet/legs/abdomen, new questions or concerns about heart failure or heart health with provider Screening for signs and symptoms of depression related to chronic disease state  Assessed social determinant of health barrier Evaluation of current treatment plan related to hypertension self management and patient's adherence to plan as established by provider. The patient saw  cardiologist recently and his blood pressure was elevated to 170/72. Changes made in his Lisinopril;   Provided education to patient re: stroke prevention, s/s of heart attack and stroke; Reviewed prescribed diet heart healthy diet. Review and education provided. Reviewed medications with patient and discussed importance of compliance. Review of the medication changes with Lisinopril from 5 mg to 20 mg by the cardiologist on 05-23-2022. Review of the side effects to monitor for. Also education provided on orthostatic hypotension and if he notices changes in how he feels going from a laying to sitting, sitting to standing position. The patient denies any issues with drops in blood pressure;  Discussed plans with patient for ongoing care management follow up and provided patient with direct contact information for care management team; Advised patient, providing education and rationale, to monitor blood pressure daily and record, calling PCP for findings outside established parameters. The patient states he has a cuff he bought from [place] but the first time he used it it made his arm hurt so bad that he has not used it again. Said he did not know if he was using it correctly. Will send a flyer for the proper way to apply the cuff. Did a verbal instruction with the patient by  phone today. Also the patient instructed to bring the cuff to his next MD appointment to be checked by the office monitor;  Reviewed scheduled/upcoming provider appointments including: 09-28-2022 at 120 pm Advised patient to discuss changes in blood pressures, and review of his home monitor in the office compared to the office monitor with provider; Provided education on prescribed diet heart healthy;  Discussed complications of poorly controlled blood pressure such as heart disease, stroke, circulatory complications, vision complications, kidney impairment, sexual dysfunction;  Screening for signs and symptoms of depression related to  chronic disease state;  Assessed social determinant of health barriers;  Provided patient with basic written and verbal COPD education on self care/management/and exacerbation prevention Advised patient to track and manage COPD triggers Provided written and verbal instructions on pursed lip breathing and utilized returned demonstration as teach back Provided instruction about proper use of medications used for management of COPD including inhalers. The patient states after seeing pcp that her received more Symbicort in the mail. He has an 8 month supply. The patient has not filled out the paperwork for Anoro PAP for 2024 because he wanted to talk to pcp about Symbicort supply. Will send an in basket message to the pcp and pharm D with information obtained during outreach today Advised patient to self assesses COPD action plan zone and make appointment with provider if in the yellow zone for 48 hours without improvement Advised patient to engage in light exercise as tolerated 3-5 days a week to aid in the the management of COPD Provided education about and advised patient to utilize infection prevention strategies to reduce risk of respiratory infection Discussed the importance of adequate rest and management of fatigue with COPD Screening for signs and symptoms of depression related to chronic disease state  Assessed social determinant of health barriers   Interaction and coordination with outside resources, practitioners, and providers See CCM Referral  Care Plan: Available in MyChart

## 2022-06-13 NOTE — Patient Instructions (Addendum)
Please call the care guide team at [phone] if you need to cancel or reschedule your appointment.   If you are experiencing a Mental Health or Behavioral Health Crisis or need someone to talk to, please call the Suicide and Crisis Lifeline: 988 call the [place] National Suicide Prevention Lifeline: 671-574-0789 or TTY: 985 885 4518 TTY 704 816 9902) to talk to a trained counselor call 1-800-273-TALK (toll free, 24 hour hotline)   Following is a copy of your full provider care plan:   Goals Addressed             This Visit's Progress    CCM Expected Outcome:  Monitor, Self-Manage and Reduce Symptoms of Heart Failure       Current Barriers:  Chronic Disease Management support and education needs related to effective management of HF  Planned Interventions: Basic overview and discussion of pathophysiology of Heart Failure reviewed Provided education on low sodium diet. Review and education provided Reviewed Heart Failure Action Plan in depth and provided written copy Assessed need for readable accurate scales in home. Has scales, weighs daily, weight stays around 188 Provided education about placing scale on hard, flat surface Advised patient to weigh each morning after emptying bladder Discussed importance of daily weight and advised patient to weigh and record daily Reviewed role of diuretics in prevention of fluid overload and management of heart failure Discussed the importance of keeping all appointments with provider Provided patient with education about the role of exercise in the management of heart failure Advised patient to discuss changes in weight, changes in swelling/edema in feet/legs/abdomen, new questions or concerns about heart failure or heart health with provider Screening for signs and symptoms of depression related to chronic disease state  Assessed social determinant of health barriers  Symptom Management: Take medications as prescribed   Attend all scheduled  provider appointments Call provider office for new concerns or questions  call the Suicide and Crisis Lifeline: 988 call the [place] National Suicide Prevention Lifeline: (667)166-2078 or TTY: 623 230 4103 TTY (856)074-6927) to talk to a trained counselor call 1-800-273-TALK (toll free, 24 hour hotline) if experiencing a Mental Health or Behavioral Health Crisis  call office if I gain more than 2 pounds in one day or 5 pounds in one week track weight in diary use salt in moderation watch for swelling in feet, ankles and legs every day weigh myself daily bring diary to all appointments develop a rescue plan follow rescue plan if symptoms flare-up know when to call the doctorfor changes in heart failure, or acute weight changes track symptoms and what helps feel better or worse dress right for the weather, hot or cold  Follow Up Plan: Telephone follow up appointment with care management team member scheduled for: 07-26-2022 at 1145 am       CCM Expected Outcome:  Monitor, Self-Manage, and Reduce Symptoms of Hypertension       Current Barriers:  Knowledge Deficits related to normalized blood pressures and the need to maintain systolic blood pressures of <150 and diastolic <90 Care Coordination needs related to the proper use of blood pressure cuff in a patient with HTN Chronic Disease Management support and education needs related to effective management of HTN  Planned Interventions: Evaluation of current treatment plan related to hypertension self management and patient's adherence to plan as established by provider. The patient saw cardiologist recently and his blood pressure was elevated to 170/72. Changes made in his Lisinopril;   Provided education to patient re: stroke prevention, s/s of heart attack  and stroke; Reviewed prescribed diet heart healthy diet. Review and education provided. Reviewed medications with patient and discussed importance of compliance. Review of the medication  changes with Lisinopril from 5 mg to 20 mg by the cardiologist on 05-23-2022. Review of the side effects to monitor for. Also education provided on orthostatic hypotension and if he notices changes in how he feels going from a laying to sitting, sitting to standing position. The patient denies any issues with drops in blood pressure;  Discussed plans with patient for ongoing care management follow up and provided patient with direct contact information for care management team; Advised patient, providing education and rationale, to monitor blood pressure daily and record, calling PCP for findings outside established parameters. The patient states he has a cuff he bought from [place] but the first time he used it it made his arm hurt so bad that he has not used it again. Said he did not know if he was using it correctly. Will send a flyer for the proper way to apply the cuff. Did a verbal instruction with the patient by phone today. Also the patient instructed to bring the cuff to his next MD appointment to be checked by the office monitor;  Reviewed scheduled/upcoming provider appointments including: 09-28-2022 at 120 pm Advised patient to discuss changes in blood pressures, and review of his home monitor in the office compared to the office monitor with provider; Provided education on prescribed diet heart healthy;  Discussed complications of poorly controlled blood pressure such as heart disease, stroke, circulatory complications, vision complications, kidney impairment, sexual dysfunction;  Screening for signs and symptoms of depression related to chronic disease state;  Assessed social determinant of health barriers;   Symptom Management: Take medications as prescribed   Attend all scheduled provider appointments Call provider office for new concerns or questions  call the Suicide and Crisis Lifeline: 988 call the [place] National Suicide Prevention Lifeline: 615-136-6095 or TTY: 204-309-8252 TTY  419-578-1329) to talk to a trained counselor call 1-800-273-TALK (toll free, 24 hour hotline) if experiencing a Mental Health or Behavioral Health Crisis  check blood pressure 3 times per week write blood pressure results in a log or diary learn about high blood pressure keep a blood pressure log take blood pressure log to all doctor appointments call doctor for signs and symptoms of high blood pressure develop an action plan for high blood pressure keep all doctor appointments take medications for blood pressure exactly as prescribed report new symptoms to your doctor  Follow Up Plan: Telephone follow up appointment with care management team member scheduled for:07-26-2022 at 1145 am       CCM:  Maintain, Monitor and Self-Manage Symptoms of COPD       Current Barriers:  Care Coordination needs related to supply of Symbicort and paperwork that the patient has to get Anoro approved in a patient with COPD Chronic Disease Management support and education needs related to effective management of COPD  Planned Interventions: Provided patient with basic written and verbal COPD education on self care/management/and exacerbation prevention Advised patient to track and manage COPD triggers Provided written and verbal instructions on pursed lip breathing and utilized returned demonstration as teach back Provided instruction about proper use of medications used for management of COPD including inhalers. The patient states after seeing pcp that her received more Symbicort in the mail. He has an 8 month supply. The patient has not filled out the paperwork for Anoro PAP for 2024 because he wanted to  talk to pcp about Symbicort supply. Will send an in basket message to the pcp and pharm D with information obtained during outreach today Advised patient to self assesses COPD action plan zone and make appointment with provider if in the yellow zone for 48 hours without improvement Advised patient to  engage in light exercise as tolerated 3-5 days a week to aid in the the management of COPD Provided education about and advised patient to utilize infection prevention strategies to reduce risk of respiratory infection Discussed the importance of adequate rest and management of fatigue with COPD Screening for signs and symptoms of depression related to chronic disease state  Assessed social determinant of health barriers  Symptom Management: Take medications as prescribed   Attend all scheduled provider appointments Call provider office for new concerns or questions  call the Suicide and Crisis Lifeline: 988 call the [place] National Suicide Prevention Lifeline: (423)019-6567 or TTY: 478-192-0804 TTY (267)665-7439) to talk to a trained counselor call 1-800-273-TALK (toll free, 24 hour hotline) if experiencing a Mental Health or Behavioral Health Crisis  eliminate smoking in my home identify and remove indoor air pollutants limit outdoor activity during cold weather listen for public air quality announcements every day do breathing exercises every day develop a rescue plan eliminate symptom triggers at home follow rescue plan if symptoms flare-up  Follow Up Plan: Telephone follow up appointment with care management team member scheduled for: 07-26-2022 at 1145 am          Patient verbalizes understanding of instructions and care plan provided today and agrees to view in MyChart. Active MyChart status and patient understanding of how to access instructions and care plan via MyChart confirmed with patient.     Telephone follow up appointment with care management team member scheduled for: 07-26-2022 at 1145 am   How to Take Your Blood Pressure Blood pressure is a measurement of how strongly your blood is pressing against the walls of your arteries. Arteries are blood vessels that carry blood from your heart throughout your body. Your health care provider takes your blood pressure at each  office visit. You can also take your own blood pressure at home with a blood pressure monitor. You may need to take your own blood pressure to: Confirm a diagnosis of high blood pressure (hypertension). Monitor your blood pressure over time. Make sure your blood pressure medicine is working. Supplies needed: Blood pressure monitor. A chair to sit in. This should be a chair where you can sit upright with your back supported. Do not sit on a soft couch or an armchair. Table or desk. Small notebook and pencil or pen. How to prepare To get the most accurate reading, avoid the following for 30 minutes before you check your blood pressure: Drinking caffeine. Drinking alcohol. Eating. Smoking. Exercising. Five minutes before you check your blood pressure: Use the bathroom and urinate so that you have an empty bladder. Sit quietly in a chair. Do not talk. How to take your blood pressure To check your blood pressure, follow the instructions in the manual that came with your blood pressure monitor. If you have a digital blood pressure monitor, the instructions may be as follows: Sit up straight in a chair. Place your feet on the floor. Do not cross your ankles or legs. Rest your left arm at the level of your heart on a table or desk or on the arm of a chair. Pull up your shirt sleeve. Wrap the blood pressure cuff around the  upper part of your left arm, 1 inch (2.5 cm) above your elbow. It is best to wrap the cuff around bare skin. Fit the cuff snugly, but not too tightly, around your arm. You should be able to place only one finger between the cuff and your arm. Position the cord so that it rests in the bend of your elbow. Press the power button. Sit quietly while the cuff inflates and deflates. Read the digital reading on the monitor screen and write the numbers down (record them) in a notebook. Wait 2-3 minutes, then repeat the steps, starting at step 1. What does my blood pressure reading  mean? A blood pressure reading consists of a higher number over a lower number. Ideally, your blood pressure should be below 120/80. The first ("top") number is called the systolic pressure. It is a measure of the pressure in your arteries as your heart beats. The second ("bottom") number is called the diastolic pressure. It is a measure of the pressure in your arteries as the heart relaxes. Blood pressure is classified into four stages. The following are the stages for adults who do not have a short-term serious illness or a chronic condition. Systolic pressure and diastolic pressure are measured in a unit called mm Hg (millimeters of mercury).  Normal Systolic pressure: below 120. Diastolic pressure: below 80. Elevated Systolic pressure: 120-129. Diastolic pressure: below 80. Hypertension stage 1 Systolic pressure: 130-139. Diastolic pressure: 80-89. Hypertension stage 2 Systolic pressure: 140 or above. Diastolic pressure: 90 or above. You can have elevated blood pressure or hypertension even if only the systolic or only the diastolic number in your reading is higher than normal. Follow these instructions at home: Medicines Take over-the-counter and prescription medicines only as told by your health care provider. Tell your health care provider if you are having any side effects from blood pressure medicine. General instructions Check your blood pressure as often as recommended by your health care provider. Check your blood pressure at the same time every day. Take your monitor to the next appointment with your health care provider to make sure that: You are using it correctly. It provides accurate readings. Understand what your goal blood pressure numbers are. Keep all follow-up visits. This is important. General tips Your health care provider can suggest a reliable monitor that will meet your needs. There are several types of home blood pressure monitors. Choose a monitor that has  an arm cuff. Do not choose a monitor that measures your blood pressure from your wrist or finger. Choose a cuff that wraps snugly, not too tight or too loose, around your upper arm. You should be able to fit only one finger between your arm and the cuff. You can buy a blood pressure monitor at most drugstores or online. Where to find more information American Heart Association: www.heart.org Contact a health care provider if: Your blood pressure is consistently high. Your blood pressure is suddenly low. Get help right away if: Your systolic blood pressure is higher than 180. Your diastolic blood pressure is higher than 120. These symptoms may be an emergency. Get help right away. Call 911. Do not wait to see if the symptoms will go away. Do not drive yourself to the hospital. Summary Blood pressure is a measurement of how strongly your blood is pressing against the walls of your arteries. A blood pressure reading consists of a higher number over a lower number. Ideally, your blood pressure should be below 120/80. Check your blood pressure at  the same time every day. Avoid caffeine, alcohol, smoking, and exercise for 30 minutes prior to checking your blood pressure. These agents can affect the accuracy of the blood pressure reading. This information is not intended to replace advice given to you by your health care provider. Make sure you discuss any questions you have with your health care provider. Document Revised: 03/16/2021 Document Reviewed: 03/16/2021 Elsevier Patient Education  2023 [employer].

## 2022-06-14 DIAGNOSIS — I5022 Chronic systolic (congestive) heart failure: Secondary | ICD-10-CM

## 2022-06-14 DIAGNOSIS — I10 Essential (primary) hypertension: Secondary | ICD-10-CM

## 2022-06-14 DIAGNOSIS — J449 Chronic obstructive pulmonary disease, unspecified: Secondary | ICD-10-CM

## 2022-06-20 ENCOUNTER — Telehealth: Payer: Self-pay

## 2022-06-20 NOTE — Progress Notes (Signed)
    Chronic Care Management Pharmacy Assistant   Name: Andrew Potts  MRN: [id number] DOB: [birth year]   Reason for Encounter: Disease State   Conditions to be addressed/monitored: COPD   Recent office visits:  None since last coordination call  Recent consult visits:  None since last coordination call  Hospital visits:  None in previous 6 months  Medications: Outpatient Encounter Medications as of 06/20/2022  Medication Sig   albuterol (VENTOLIN HFA) 108 (90 Base) MCG/ACT inhaler Inhale 2 puffs into the lungs every 6 (six) hours as needed for wheezing or shortness of breath.   aspirin EC 81 MG EC tablet Take 1 tablet (81 mg total) by mouth daily. Swallow whole.   atorvastatin (LIPITOR) 80 MG tablet Take 1 tablet (80 mg total) by mouth daily.   Ensure (ENSURE) Take 237 mLs by mouth daily.   EPCLUSA 400-100 MG TABS Take 1 tablet by mouth daily.   furosemide (LASIX) 20 MG tablet Take 0.5 tablets (10 mg total) by mouth daily.   lisinopril (ZESTRIL) 5 MG tablet Take 1 tablet (5 mg total) by mouth daily. (Patient taking differently: Take 20 mg by mouth daily. 05-23-2022 the cardiologist increased the Lisinopril)   metoprolol tartrate (LOPRESSOR) 25 MG tablet Take 0.5 tablets (12.5 mg total) by mouth 2 (two) times daily.   pantoprazole (PROTONIX) 40 MG tablet Take 1 tablet (40 mg total) by mouth daily.   sildenafil (REVATIO) 20 MG tablet Take 1 tablet (20 mg total) by mouth daily as needed (erectile dysfunction).   SYMBICORT 160-4.5 MCG/ACT inhaler Inhale 2 puffs into the lungs 2 (two) times daily.   No facility-administered encounter medications on file as of 06/20/2022.   Current COPD regimen: Albuterol 108 (90 base), symbicort 160-4.5 mg inhaler  Any recent hospitalizations or ED visits since last visit with CPP? No  Reports COPD symptoms, including Increased shortness of breath   What recent interventions/DTPs have been made by any provider to improve breathing since last  visit: none noted  Have you had exacerbation/flare-up since last visit? No  What do you do when you are short of breath?  Rest  Respiratory Devices/Equipment Do you have a nebulizer? No Do you use a Peak Flow Meter? No Do you use a maintenance inhaler? Yes How often do you forget to use your daily inhaler? Never Do you use a rescue inhaler? Yes How often do you use your rescue inhaler?  prn Do you use a spacer with your inhaler? No  Adherence Review: Does the patient have >5 day gap between last estimated fill date for maintenance inhaler medications? No   Care Gaps: Colonoscopy-NA Diabetic Foot Exam-NA Ophthalmology-NA Dexa Scan - NA Annual Well Visit - 01/11/22 Micro albumin-03/29/22 Hemoglobin A1c- NA  Star Rating Drugs: Atorvastatin 80 mg-LF 05/23/22 90 ds, 04/24/22 100 ds Lisinopril 5 mg-LF 05/30/22 90 ds, 02/27/22 100 ds   [facility] [phone]

## 2022-07-04 ENCOUNTER — Ambulatory Visit (INDEPENDENT_AMBULATORY_CARE_PROVIDER_SITE_OTHER): Payer: Medicare HMO

## 2022-07-04 ENCOUNTER — Encounter: Payer: Medicare HMO | Admitting: Gastroenterology

## 2022-07-04 DIAGNOSIS — Z23 Encounter for immunization: Secondary | ICD-10-CM | POA: Diagnosis not present

## 2022-07-20 ENCOUNTER — Other Ambulatory Visit: Payer: Self-pay | Admitting: Nurse Practitioner

## 2022-07-20 NOTE — Telephone Encounter (Signed)
Requested Prescriptions  Pending Prescriptions Disp Refills   sildenafil (REVATIO) 20 MG tablet [Pharmacy Med Name: SILDENAFIL 20 MG TABLET] 10 tablet 0    Sig: Take 1 tablet (20 mg total) by mouth daily as needed (erectile dysfunction).     Urology: Erectile Dysfunction Agents Passed - 07/20/2022 12:33 PM      Passed - AST in normal range and within 360 days    AST  Date Value Ref Range Status  03/29/2022 17 0 - 40 IU/L Final         Passed - ALT in normal range and within 360 days    ALT  Date Value Ref Range Status  03/29/2022 12 0 - 44 IU/L Final         Passed - Last BP in normal range    BP Readings from Last 1 Encounters:  04/12/22 138/80         Passed - Valid encounter within last 12 months    Recent Outpatient Visits           3 months ago Skin tags, multiple acquired   [place], [place] T, NP   3 months ago Chronic active hepatitis ([place])   [place], Jolene T, NP   9 months ago Chronic systolic heart failure ([place])   [place], Jolene T, NP   10 months ago Chronic obstructive pulmonary disease, unspecified COPD type ([place])   [place], Jolene T, NP   1 year ago Chronic systolic heart failure ([place])   [place], Barbaraann Faster, NP       Future Appointments             In 2 months [name], Barbaraann Faster, NP [facility], [facility]

## 2022-07-26 ENCOUNTER — Telehealth: Payer: Medicare HMO

## 2022-07-26 ENCOUNTER — Ambulatory Visit (INDEPENDENT_AMBULATORY_CARE_PROVIDER_SITE_OTHER): Payer: Medicare HMO

## 2022-07-26 DIAGNOSIS — I10 Essential (primary) hypertension: Secondary | ICD-10-CM

## 2022-07-26 DIAGNOSIS — I5022 Chronic systolic (congestive) heart failure: Secondary | ICD-10-CM

## 2022-07-26 DIAGNOSIS — J449 Chronic obstructive pulmonary disease, unspecified: Secondary | ICD-10-CM

## 2022-07-26 NOTE — Patient Instructions (Signed)
Please call the care guide team at [phone] if you need to cancel or reschedule your appointment.   If you are experiencing a Mental Health or [place] or need someone to talk to, please call the Suicide and Crisis Lifeline: 988 call the [place] National Suicide Prevention Lifeline: 873-266-8903 or TTY: 425-698-2215 TTY 352-218-7677) to talk to a trained counselor call 1-800-273-TALK (toll free, 24 hour hotline)   Following is a copy of the CCM Program Consent:  CCM service includes personalized support from designated clinical staff supervised by the physician, including individualized plan of care and coordination with other care providers 24/7 contact phone numbers for assistance for urgent and routine care needs. Service will only be billed when office clinical staff spend 20 minutes or more in a month to coordinate care. Only one practitioner may furnish and bill the service in a calendar month. The patient may stop CCM services at amy time (effective at the end of the month) by phone call to the office staff. The patient will be responsible for cost sharing (co-pay) or up to 20% of the service fee (after annual deductible is met)  Following is a copy of your full provider care plan:   Goals Addressed             This Visit's Progress    CCM Expected Outcome:  Monitor, Self-Manage and Reduce Symptoms of Heart Failure       Current Barriers:  Chronic Disease Management support and education needs related to effective management of HF  Wt Readings from Last 3 Encounters:  04/12/22 189 lb 6.4 oz (85.9 kg)  03/29/22 188 lb 1.6 oz (85.3 kg)  01/01/22 182 lb 9.6 oz (82.8 kg)    Planned Interventions: Basic overview and discussion of pathophysiology of Heart Failure reviewed. The patient has a good understanding of his heart failure and knows changes to watch for Provided education on low sodium diet. Review and education provided Reviewed Heart Failure Action Plan  in depth and provided written copy Assessed need for readable accurate scales in home. Has scales, weighs daily, weight stays around 185 recently. He knows to monitor for swelling and edema Provided education about placing scale on hard, flat surface Advised patient to weigh each morning after emptying bladder Discussed importance of daily weight and advised patient to weigh and record daily Reviewed role of diuretics in prevention of fluid overload and management of heart failure Discussed the importance of keeping all appointments with provider Provided patient with education about the role of exercise in the management of heart failure Advised patient to discuss changes in weight, changes in swelling/edema in feet/legs/abdomen, new questions or concerns about heart failure or heart health with provider Screening for signs and symptoms of depression related to chronic disease state  Assessed social determinant of health barriers  Symptom Management: Take medications as prescribed   Attend all scheduled provider appointments Call provider office for new concerns or questions  call the Suicide and Crisis Lifeline: 988 call the [place] National Suicide Prevention Lifeline: (507)044-3497 or TTY: 4317052071 TTY 706-330-3417) to talk to a trained counselor call 1-800-273-TALK (toll free, 24 hour hotline) if experiencing a Mental Health or [place]  call office if I gain more than 2 pounds in one day or 5 pounds in one week track weight in diary use salt in moderation watch for swelling in feet, ankles and legs every day weigh myself daily bring diary to all appointments develop a rescue plan follow rescue  plan if symptoms flare-up know when to call the doctorfor changes in heart failure, or acute weight changes track symptoms and what helps feel better or worse dress right for the weather, hot or cold  Follow Up Plan: Telephone follow up appointment with care management  team member scheduled for: 10-02-2022 at 1145 am       CCM Expected Outcome:  Monitor, Self-Manage, and Reduce Symptoms of Hypertension       Current Barriers:  Knowledge Deficits related to normalized blood pressures and the need to maintain systolic blood pressures of <269 and diastolic <48 Care Coordination needs related to the proper use of blood pressure cuff in a patient with HTN Chronic Disease Management support and education needs related to effective management of HTN  BP Readings from Last 3 Encounters:  04/12/22 138/80  03/29/22 132/72  01/01/22 126/79    Planned Interventions: Evaluation of current treatment plan related to hypertension self management and patient's adherence to plan as established by provider. The patient states he has not taken his blood pressures at home because of the cuff he has. He will bring to his appointment in March. He denies any headaches or other issues related to HTN at this time. Will continue to monitor.  Provided education to patient re: stroke prevention, s/s of heart attack and stroke; Reviewed prescribed diet heart healthy diet. Review and education provided. Reviewed medications with patient and discussed importance of compliance. The patient denies any issues with orthostatic hypotension since having changes in medications. The patient knows about side effects to look for. Education and support given. Discussed plans with patient for ongoing care management follow up and provided patient with direct contact information for care management team; Advised patient, providing education and rationale, to monitor blood pressure daily and record, calling PCP for findings outside established parameters. The patient has not used his cuff anymore. He will bring it to his appointment in March and will have the office look at it and help him with it.  Reviewed scheduled/upcoming provider appointments including: 09-28-2022 at 120 pm Advised patient to discuss  changes in blood pressures, and review of his home monitor in the office compared to the office monitor with provider; Provided education on prescribed diet heart healthy;  Discussed complications of poorly controlled blood pressure such as heart disease, stroke, circulatory complications, vision complications, kidney impairment, sexual dysfunction;  Screening for signs and symptoms of depression related to chronic disease state;  Assessed social determinant of health barriers;   Symptom Management: Take medications as prescribed   Attend all scheduled provider appointments Call provider office for new concerns or questions  call the Suicide and Crisis Lifeline: 988 call the [place] National Suicide Prevention Lifeline: 9166027385 or TTY: 8103901226 TTY (805) 736-3146) to talk to a trained counselor call 1-800-273-TALK (toll free, 24 hour hotline) if experiencing a Mental Health or [place]  check blood pressure 3 times per week write blood pressure results in a log or diary learn about high blood pressure keep a blood pressure log take blood pressure log to all doctor appointments call doctor for signs and symptoms of high blood pressure develop an action plan for high blood pressure keep all doctor appointments take medications for blood pressure exactly as prescribed report new symptoms to your doctor  Follow Up Plan: Telephone follow up appointment with care management team member scheduled for: 10-02-2022 at 1145 am       CCM:  Maintain, Monitor and Self-Manage Symptoms of COPD  Current Barriers:  Care Coordination needs related to supply of Symbicort and paperwork that the patient has to get Anoro approved in a patient with COPD Chronic Disease Management support and education needs related to effective management of COPD  Planned Interventions: Provided patient with basic written and verbal COPD education on self care/management/and exacerbation  prevention. The patient denies any changes in his breathing. When it is cold or extreme weather he stays in. He is mindful of changes and knows to alert the provider for new concerns Advised patient to track and manage COPD triggers. Review of triggers and the patient knows how to manage changes.  Provided written and verbal instructions on pursed lip breathing and utilized returned demonstration as teach back Provided instruction about proper use of medications used for management of COPD including inhalers. The patient states after seeing pcp that her received more Symbicort in the mail. He has an 8 month supply. The patient has not filled out the paperwork for Anoro PAP for 2024 because he wanted to talk to pcp about Symbicort supply. The patient has his Anoro paperwork and will fill it out and take it in with him to see the pcp in March. The patient still has a good supply of his Symbicort. Education and support given.  Advised patient to self assesses COPD action plan zone and make appointment with provider if in the yellow zone for 48 hours without improvement Advised patient to engage in light exercise as tolerated 3-5 days a week to aid in the the management of COPD Provided education about and advised patient to utilize infection prevention strategies to reduce risk of respiratory infection. Review and education provided. The patient is doing well and staying in especially with weather changes. Discussed the importance of adequate rest and management of fatigue with COPD Screening for signs and symptoms of depression related to chronic disease state  Assessed social determinant of health barriers Vaccinations are up to date  Symptom Management: Take medications as prescribed   Attend all scheduled provider appointments Call provider office for new concerns or questions  call the Suicide and Crisis Lifeline: 988 call the [place] National Suicide Prevention Lifeline: (520)800-3140 or TTY:  (501)067-3425 TTY 858-218-7846) to talk to a trained counselor call 1-800-273-TALK (toll free, 24 hour hotline) if experiencing a Mental Health or Behavioral Health Crisis  eliminate smoking in my home identify and remove indoor air pollutants limit outdoor activity during cold weather listen for public air quality announcements every day do breathing exercises every day develop a rescue plan eliminate symptom triggers at home follow rescue plan if symptoms flare-up  Follow Up Plan: Telephone follow up appointment with care management team member scheduled for: 10-02-2022 at 1145 am          Patient verbalizes understanding of instructions and care plan provided today and agrees to view in MyChart. Active MyChart status and patient understanding of how to access instructions and care plan via MyChart confirmed with patient.     Telephone follow up appointment with care management team member scheduled for: 10-02-2022 at 1145 am

## 2022-07-26 NOTE — Chronic Care Management (AMB) (Signed)
Chronic Care Management   CCM RN Visit Note  07/26/2022 Name: Andrew Potts MRN: [id number] DOB: Oct 26, 1950  Subjective: Andrew Potts is a 72 y.o. year old male who is a primary care patient of [name], Dorie Rank, NP. The patient was referred to the Chronic Care Management team for assistance with care management needs subsequent to provider initiation of CCM services and plan of care.    Today's Visit:  Engaged with patient by telephone for follow up visit.     SDOH Interventions Today    Flowsheet Row Most Recent Value  SDOH Interventions   Food Insecurity Interventions Intervention Not Indicated  Housing Interventions Intervention Not Indicated  Transportation Interventions Intervention Not Indicated  Utilities Interventions Intervention Not Indicated  Alcohol Usage Interventions Intervention Not Indicated (Score <7)  Financial Strain Interventions Other (Comment)  [Pap for medications]  Physical Activity Interventions Other (Comments)  [no structured activity, encouraged activity]  Stress Interventions Intervention Not Indicated, Other (Comment)  [concerned about living area]  Social Connections Interventions Intervention Not Indicated, Other (Comment)  [has a good support system]         Goals Addressed             This Visit's Progress    CCM Expected Outcome:  Monitor, Self-Manage and Reduce Symptoms of Heart Failure       Current Barriers:  Chronic Disease Management support and education needs related to effective management of HF  Wt Readings from Last 3 Encounters:  04/12/22 189 lb 6.4 oz (85.9 kg)  03/29/22 188 lb 1.6 oz (85.3 kg)  01/01/22 182 lb 9.6 oz (82.8 kg)    Planned Interventions: Basic overview and discussion of pathophysiology of Heart Failure reviewed. The patient has a good understanding of his heart failure and knows changes to watch for Provided education on low sodium diet. Review and education provided Reviewed Heart Failure Action  Plan in depth and provided written copy Assessed need for readable accurate scales in home. Has scales, weighs daily, weight stays around 185 recently. He knows to monitor for swelling and edema Provided education about placing scale on hard, flat surface Advised patient to weigh each morning after emptying bladder Discussed importance of daily weight and advised patient to weigh and record daily Reviewed role of diuretics in prevention of fluid overload and management of heart failure Discussed the importance of keeping all appointments with provider Provided patient with education about the role of exercise in the management of heart failure Advised patient to discuss changes in weight, changes in swelling/edema in feet/legs/abdomen, new questions or concerns about heart failure or heart health with provider Screening for signs and symptoms of depression related to chronic disease state  Assessed social determinant of health barriers  Symptom Management: Take medications as prescribed   Attend all scheduled provider appointments Call provider office for new concerns or questions  call the Suicide and Crisis Lifeline: 988 call the [place] National Suicide Prevention Lifeline: 218-437-3359 or TTY: (984) 861-3544 TTY 541-203-9082) to talk to a trained counselor call 1-800-273-TALK (toll free, 24 hour hotline) if experiencing a Mental Health or Behavioral Health Crisis  call office if I gain more than 2 pounds in one day or 5 pounds in one week track weight in diary use salt in moderation watch for swelling in feet, ankles and legs every day weigh myself daily bring diary to all appointments develop a rescue plan follow rescue plan if symptoms flare-up know when to call the doctorfor changes in heart failure,  or acute weight changes track symptoms and what helps feel better or worse dress right for the weather, hot or cold  Follow Up Plan: Telephone follow up appointment with care  management team member scheduled for: 10-02-2022 at 1145 am       CCM Expected Outcome:  Monitor, Self-Manage, and Reduce Symptoms of Hypertension       Current Barriers:  Knowledge Deficits related to normalized blood pressures and the need to maintain systolic blood pressures of <235 and diastolic <57 Care Coordination needs related to the proper use of blood pressure cuff in a patient with HTN Chronic Disease Management support and education needs related to effective management of HTN  BP Readings from Last 3 Encounters:  04/12/22 138/80  03/29/22 132/72  01/01/22 126/79    Planned Interventions: Evaluation of current treatment plan related to hypertension self management and patient's adherence to plan as established by provider. The patient states he has not taken his blood pressures at home because of the cuff he has. He will bring to his appointment in March. He denies any headaches or other issues related to HTN at this time. Will continue to monitor.  Provided education to patient re: stroke prevention, s/s of heart attack and stroke; Reviewed prescribed diet heart healthy diet. Review and education provided. Reviewed medications with patient and discussed importance of compliance. The patient denies any issues with orthostatic hypotension since having changes in medications. The patient knows about side effects to look for. Education and support given. Discussed plans with patient for ongoing care management follow up and provided patient with direct contact information for care management team; Advised patient, providing education and rationale, to monitor blood pressure daily and record, calling PCP for findings outside established parameters. The patient has not used his cuff anymore. He will bring it to his appointment in March and will have the office look at it and help him with it.  Reviewed scheduled/upcoming provider appointments including: 09-28-2022 at 120 pm Advised patient  to discuss changes in blood pressures, and review of his home monitor in the office compared to the office monitor with provider; Provided education on prescribed diet heart healthy;  Discussed complications of poorly controlled blood pressure such as heart disease, stroke, circulatory complications, vision complications, kidney impairment, sexual dysfunction;  Screening for signs and symptoms of depression related to chronic disease state;  Assessed social determinant of health barriers;   Symptom Management: Take medications as prescribed   Attend all scheduled provider appointments Call provider office for new concerns or questions  call the Suicide and Crisis Lifeline: 988 call the [place] National Suicide Prevention Lifeline: (314) 389-6043 or TTY: 952-573-5996 TTY 213 638 7391) to talk to a trained counselor call 1-800-273-TALK (toll free, 24 hour hotline) if experiencing a Mental Health or [place]  check blood pressure 3 times per week write blood pressure results in a log or diary learn about high blood pressure keep a blood pressure log take blood pressure log to all doctor appointments call doctor for signs and symptoms of high blood pressure develop an action plan for high blood pressure keep all doctor appointments take medications for blood pressure exactly as prescribed report new symptoms to your doctor  Follow Up Plan: Telephone follow up appointment with care management team member scheduled for: 10-02-2022 at 1145 am       CCM:  Maintain, Monitor and Self-Manage Symptoms of COPD       Current Barriers:  Care Coordination needs related to supply of  Symbicort and paperwork that the patient has to get Anoro approved in a patient with COPD Chronic Disease Management support and education needs related to effective management of COPD  Planned Interventions: Provided patient with basic written and verbal COPD education on self care/management/and  exacerbation prevention. The patient denies any changes in his breathing. When it is cold or extreme weather he stays in. He is mindful of changes and knows to alert the provider for new concerns Advised patient to track and manage COPD triggers. Review of triggers and the patient knows how to manage changes.  Provided written and verbal instructions on pursed lip breathing and utilized returned demonstration as teach back Provided instruction about proper use of medications used for management of COPD including inhalers. The patient states after seeing pcp that her received more Symbicort in the mail. He has an 8 month supply. The patient has not filled out the paperwork for Anoro PAP for 2024 because he wanted to talk to pcp about Symbicort supply. The patient has his Anoro paperwork and will fill it out and take it in with him to see the pcp in March. The patient still has a good supply of his Symbicort. Education and support given.  Advised patient to self assesses COPD action plan zone and make appointment with provider if in the yellow zone for 48 hours without improvement Advised patient to engage in light exercise as tolerated 3-5 days a week to aid in the the management of COPD Provided education about and advised patient to utilize infection prevention strategies to reduce risk of respiratory infection. Review and education provided. The patient is doing well and staying in especially with weather changes. Discussed the importance of adequate rest and management of fatigue with COPD Screening for signs and symptoms of depression related to chronic disease state  Assessed social determinant of health barriers Vaccinations are up to date  Symptom Management: Take medications as prescribed   Attend all scheduled provider appointments Call provider office for new concerns or questions  call the Suicide and Crisis Lifeline: 988 call the [place] National Suicide Prevention Lifeline: 832-383-4513  or TTY: 206-036-9972 TTY 912-194-9032) to talk to a trained counselor call 1-800-273-TALK (toll free, 24 hour hotline) if experiencing a Mental Health or [place]  eliminate smoking in my home identify and remove indoor air pollutants limit outdoor activity during cold weather listen for public air quality announcements every day do breathing exercises every day develop a rescue plan eliminate symptom triggers at home follow rescue plan if symptoms flare-up  Follow Up Plan: Telephone follow up appointment with care management team member scheduled for: 10-02-2022 at 1145 am          Plan:Telephone follow up appointment with care management team member scheduled for:  10-02-2022 at 1145 am  Noreene Larsson RN, MSN, CCM RN Care Manager  Chronic Care Management Direct Number: [phone]

## 2022-07-31 ENCOUNTER — Ambulatory Visit: Payer: Self-pay | Admitting: *Deleted

## 2022-07-31 NOTE — Patient Outreach (Signed)
  Care Coordination   Initial Visit Note   07/31/2022 Name: Andrew Potts MRN: [id number] DOB: 05-30-1951  Andrew Potts is a 72 y.o. year old male who sees [place], Henrine Screws T, NP for primary care. I spoke with  Andrew Potts by phone today.  What matters to the patients health and wellness today?  Housing    Goals Addressed             This Visit's Progress    "I would like to move"       Care Coordination Interventions: Patient states that he currently lives in a mobile home-new [employer] owners-would like to move  Patient states that he has lived in the mobile home for the last 14 years- the insulation in the mobile home is poor, has a heat pump to heat the home Patient reports that he receives social security only and extra help-would like to move to a senior living apartment-application completed with [facility]-currently on waiting list Confirmed that this social worker can assist by sending housing resources by [employer] would need to contact each resource separately to discuss housing options and availability -also discussed using [email or web address] Patient requesting resources for utility assistance-encouraged patient to contact the Department of Social Services for emergency assistance Depression screen reviewed  Solution-Focused Strategies employed:  Active listening / Reflection utilized  Emotional Support Provided related to housing needs         SDOH assessments and interventions completed:  Yes  SDOH Interventions Today    Flowsheet Row Most Recent Value  SDOH Interventions   Food Insecurity Interventions Intervention Not Indicated  Housing Interventions Intervention Not Indicated  [hoping to identify new housing options-currenlty lives in a trailer]        Care Coordination Interventions:  Yes, provided   Follow up plan: Follow up call scheduled for 08/14/22    Encounter Outcome:  Pt. Visit Completed

## 2022-07-31 NOTE — Patient Instructions (Signed)
Visit Information  Thank you for taking time to visit with me today. Please don't hesitate to contact me if I can be of assistance to you.   Following are the goals we discussed today:   Goals Addressed             This Visit's Progress    "I would like to move"       Care Coordination Interventions: Patient states that he currently lives in a mobile home-new Andrew Potts owners-would like to move  Patient states that he has lived in the mobile home for the last 14 years- the insulation in the mobile home is poor, has a heat pump to heat the home Patient reports that he receives social security only and extra help-would like to move to a senior living apartment-application completed with [facility]-currently on waiting list Confirmed that this social worker can assist by sending housing resources by [employer] would need to contact each resource separately to discuss housing options and availability -also discussed using [place] search.org Patient requesting resources for utility assistance-encouraged patient to contact the Department of Social Services for emergency assistance Depression screen reviewed  Solution-Focused Strategies employed:  Active listening / Reflection utilized  Emotional Support Provided related to housing needs         Our next appointment is by telephone on 08/14/22 at 10am  Please call the care guide team at [phone] if you need to cancel or reschedule your appointment.   If you are experiencing a Mental Health or [place] or need someone to talk to, please call 911   Patient verbalizes understanding of instructions and care plan provided today and agrees to view in [place]. Active MyChart status and patient understanding of how to access instructions and care plan via MyChart confirmed with patient.     Telephone follow up appointment with care management team member scheduled for:08/14/22  Elliot Gurney, [place] Worker   [facility] Care Management [phone]

## 2022-08-14 ENCOUNTER — Ambulatory Visit: Payer: Self-pay | Admitting: *Deleted

## 2022-08-14 NOTE — Patient Instructions (Signed)
Visit Information  Thank you for taking time to visit with me today. Please don't hesitate to contact me if I can be of assistance to you.   Following are the goals we discussed today:   Goals Addressed             This Visit's Progress    "I would like to move"       Care Coordination Interventions: Patient states that he currently lives in a mobile home-new [employer] [place] owners plan to raise the rent $50.00-would like to move to a more affordable space-rent went from $450.00-$500.00. Confirmed that he did receive the housing resources by mail and is currently going through the resources received for available housing-patient remains on waiting list for [employer] housing Solution-Focused Strategies employed: discussed option of remaining in current residence or a new space-patient to continue to go through resources provided to continue to weigh his options. Active listening / Reflection utilized  Emotional Support Provided related to housing needs         Please call the care guide team at [phone] if you need to cancel or reschedule your appointment.   If you are experiencing a Mental Health or [place] or need someone to talk to, please call 911   Patient verbalizes understanding of instructions and care plan provided today and agrees to view in [place]. Active MyChart status and patient understanding of how to access instructions and care plan via MyChart confirmed with patient.     No further follow up required: patient to contact this Education officer, museum with any additional community resource needs  [employer], [place] Worker  [facility] Care Management [phone]

## 2022-08-14 NOTE — Patient Outreach (Signed)
  Care Coordination   Follow Up Visit Note   08/14/2022 Name: Andrew Potts MRN: [id number] DOB: [birth year]  Andrew Potts is a 72 y.o. year old male who sees [place], Henrine Screws T, NP for primary care. I spoke with  Andrew Potts by phone today.  What matters to the patients health and wellness today?  Affordable Housing    Goals Addressed             This Visit's Progress    "I would like to move"       Care Coordination Interventions: Patient states that he currently lives in a mobile home-new Andrew Potts owners plan to raise the rent $50.00-would like to move to a more affordable space-rent went from $450.00-$500.00. Confirmed that he did receive the housing resources by mail and is currently going through the resources received for available housing-patient remains on waiting list for [employer] housing Solution-Focused Strategies employed: discussed option of remaining in current residence or a new space-patient to continue to go through resources provided to continue to weigh his options. Active listening / Reflection utilized  Emotional Support Provided related to housing needs         SDOH assessments and interventions completed:  No     Care Coordination Interventions:  Yes, provided   Follow up plan: No further intervention required.   Encounter Outcome:  Pt. Visit Completed

## 2022-08-15 DIAGNOSIS — J449 Chronic obstructive pulmonary disease, unspecified: Secondary | ICD-10-CM

## 2022-08-15 DIAGNOSIS — I5020 Unspecified systolic (congestive) heart failure: Secondary | ICD-10-CM

## 2022-08-15 DIAGNOSIS — I110 Hypertensive heart disease with heart failure: Secondary | ICD-10-CM | POA: Diagnosis not present

## 2022-08-17 ENCOUNTER — Other Ambulatory Visit: Payer: Self-pay | Admitting: Nurse Practitioner

## 2022-08-17 NOTE — Telephone Encounter (Signed)
Requested Prescriptions  Pending Prescriptions Disp Refills   sildenafil (REVATIO) 20 MG tablet [Pharmacy Med Name: SILDENAFIL 20 MG TABLET] 10 tablet 2    Sig: Take 1 tablet (20 mg total) by mouth daily as needed (erectile dysfunction).     Urology: Erectile Dysfunction Agents Passed - 08/17/2022  9:38 AM      Passed - AST in normal range and within 360 days    AST  Date Value Ref Range Status  03/29/2022 17 0 - 40 IU/L Final         Passed - ALT in normal range and within 360 days    ALT  Date Value Ref Range Status  03/29/2022 12 0 - 44 IU/L Final         Passed - Last BP in normal range    BP Readings from Last 1 Encounters:  04/12/22 138/80         Passed - Valid encounter within last 12 months    Recent Outpatient Visits           4 months ago Skin tags, multiple acquired   [place], [place]annah T, NP   4 months ago Chronic active hepatitis ([place])   [place], Espino T, NP   10 months ago Chronic systolic heart failure ([place])   [place], [place] T, NP   11 months ago Chronic obstructive pulmonary disease, unspecified COPD type ([place])   [place], Henrine Screws T, NP   1 year ago Chronic systolic heart failure ([place])   [place], Barbaraann Faster, NP       Future Appointments             In 1 month [name], Barbaraann Faster, NP [place], [facility]

## 2022-09-19 ENCOUNTER — Other Ambulatory Visit: Payer: Self-pay | Admitting: Surgery

## 2022-09-23 NOTE — Patient Instructions (Addendum)
[facility] call them for follow-up post treatment  [phone]  DASH Eating Plan DASH stands for Dietary Approaches to Stop Hypertension. The DASH eating plan is a healthy eating plan that has been shown to: Reduce high blood pressure (hypertension). Reduce your risk for type 2 diabetes, heart disease, and stroke. Help with weight loss. What are tips for following this plan? Reading food labels Check food labels for the amount of salt (sodium) per serving. Choose foods with less than 5 percent of the Daily Value of sodium. Generally, foods with less than 300 milligrams (mg) of sodium per serving fit into this eating plan. To find whole grains, look for the word "whole" as the first word in the ingredient list. Shopping Buy products labeled as "low-sodium" or "no salt added." Buy fresh foods. Avoid canned foods and pre-made or frozen meals. Cooking Avoid adding salt when cooking. Use salt-free seasonings or herbs instead of table salt or sea salt. Check with your health care provider or pharmacist before using salt substitutes. Do not fry foods. Cook foods using healthy methods such as baking, boiling, grilling, roasting, and broiling instead. Cook with heart-healthy oils, such as olive, canola, avocado, soybean, or sunflower oil. Meal planning  Eat a balanced diet that includes: 4 or more servings of fruits and 4 or more servings of vegetables each day. Try to fill one-half of your plate with fruits and vegetables. 6-8 servings of whole grains each day. Less than 6 oz (170 g) of lean meat, poultry, or fish each day. A 3-oz (85-g) serving of meat is about the same size as a deck of cards. One egg equals 1 oz (28 g). 2-3 servings of low-fat dairy each day. One serving is 1 cup (237 mL). 1 serving of nuts, seeds, or beans 5 times each week. 2-3 servings of heart-healthy fats. Healthy fats called omega-3 fatty acids are found in foods such as walnuts, flaxseeds, fortified milks, and eggs.  These fats are also found in cold-water fish, such as sardines, salmon, and mackerel. Limit how much you eat of: Canned or prepackaged foods. Food that is high in trans fat, such as some fried foods. Food that is high in saturated fat, such as fatty meat. Desserts and other sweets, sugary drinks, and other foods with added sugar. Full-fat dairy products. Do not salt foods before eating. Do not eat more than 4 egg yolks a week. Try to eat at least 2 vegetarian meals a week. Eat more home-cooked food and less restaurant, buffet, and fast food. Lifestyle When eating at a restaurant, ask that your food be prepared with less salt or no salt, if possible. If you drink alcohol: Limit how much you use to: 0-1 drink a day for women who are not pregnant. 0-2 drinks a day for men. Be aware of how much alcohol is in your drink. In the [place] one drink equals one 12 oz bottle of beer (355 mL), one 5 oz glass of wine (148 mL), or one 1 oz glass of hard liquor (44 mL). General information Avoid eating more than 2,300 mg of salt a day. If you have hypertension, you may need to reduce your sodium intake to 1,500 mg a day. Work with your health care provider to maintain a healthy body weight or to lose weight. Ask what an ideal weight is for you. Get at least 30 minutes of exercise that causes your heart to beat faster (aerobic exercise) most days of the week. Activities may include walking,  swimming, or biking. Work with your health care provider or dietitian to adjust your eating plan to your individual calorie needs. What foods should I eat? Fruits All fresh, dried, or frozen fruit. Canned fruit in natural juice (without added sugar). Vegetables Fresh or frozen vegetables (raw, steamed, roasted, or grilled). Low-sodium or reduced-sodium tomato and vegetable juice. Low-sodium or reduced-sodium tomato sauce and tomato paste. Low-sodium or reduced-sodium canned vegetables. Grains Whole-grain or  whole-wheat bread. Whole-grain or whole-wheat pasta. Brown rice. [place]. Bulgur. Whole-grain and low-sodium cereals. Pita bread. Low-fat, low-sodium crackers. Whole-wheat flour tortillas. Meats and other proteins Skinless chicken or [place]. Ground chicken or [place]. Pork with fat trimmed off. Fish and seafood. Egg whites. Dried beans, peas, or lentils. Unsalted nuts, nut butters, and seeds. Unsalted canned beans. Lean cuts of beef with fat trimmed off. Low-sodium, lean precooked or cured meat, such as sausages or meat loaves. Dairy Low-fat (1%) or fat-free (skim) milk. Reduced-fat, low-fat, or fat-free cheeses. Nonfat, low-sodium ricotta or cottage cheese. Low-fat or nonfat yogurt. Low-fat, low-sodium cheese. Fats and oils Soft margarine without trans fats. Vegetable oil. Reduced-fat, low-fat, or light mayonnaise and salad dressings (reduced-sodium). Canola, safflower, olive, avocado, soybean, and sunflower oils. Avocado. Seasonings and condiments Herbs. Spices. Seasoning mixes without salt. Other foods Unsalted popcorn and pretzels. Fat-free sweets. The items listed above may not be a complete list of foods and beverages you can eat. Contact a dietitian for more information. What foods should I avoid? Fruits Canned fruit in a light or heavy syrup. Fried fruit. Fruit in cream or butter sauce. Vegetables Creamed or fried vegetables. Vegetables in a cheese sauce. Regular canned vegetables (not low-sodium or reduced-sodium). Regular canned tomato sauce and paste (not low-sodium or reduced-sodium). Regular tomato and vegetable juice (not low-sodium or reduced-sodium). Angie Fava. Olives. Grains Baked goods made with fat, such as croissants, muffins, or some breads. Dry pasta or rice meal packs. Meats and other proteins Fatty cuts of meat. Ribs. Fried meat. Berniece Salines. Bologna, salami, and other precooked or cured meats, such as sausages or meat loaves. Fat from the back of a pig (fatback).  Bratwurst. Salted nuts and seeds. Canned beans with added salt. Canned or smoked fish. Whole eggs or egg yolks. Chicken or [place] with skin. Dairy Whole or 2% milk, cream, and half-and-half. Whole or full-fat cream cheese. Whole-fat or sweetened yogurt. Full-fat cheese. Nondairy creamers. Whipped toppings. Processed cheese and cheese spreads. Fats and oils Butter. Stick margarine. Lard. Shortening. Ghee. Bacon fat. Tropical oils, such as coconut, palm kernel, or palm oil. Seasonings and condiments Onion salt, garlic salt, seasoned salt, table salt, and sea salt. Worcestershire sauce. Tartar sauce. Barbecue sauce. Teriyaki sauce. Soy sauce, including reduced-sodium. Steak sauce. Canned and packaged gravies. Fish sauce. Oyster sauce. Cocktail sauce. Store-bought horseradish. Ketchup. Mustard. Meat flavorings and tenderizers. Bouillon cubes. Hot sauces. Pre-made or packaged marinades. Pre-made or packaged taco seasonings. Relishes. Regular salad dressings. Other foods Salted popcorn and pretzels. The items listed above may not be a complete list of foods and beverages you should avoid. Contact a dietitian for more information. Where to find more information National Heart, Lung, and Blood Institute: https://wilson-eaton.com/ American Heart Association: www.heart.org Academy of Nutrition and Dietetics: www.eatright.[place]: www.kidney.org Summary The DASH eating plan is a healthy eating plan that has been shown to reduce high blood pressure (hypertension). It may also reduce your risk for type 2 diabetes, heart disease, and stroke. When on the DASH eating plan, aim to eat more fresh fruits and vegetables,  whole grains, lean proteins, low-fat dairy, and heart-healthy fats. With the DASH eating plan, you should limit salt (sodium) intake to 2,300 mg a day. If you have hypertension, you may need to reduce your sodium intake to 1,500 mg a day. Work with your health care provider or  dietitian to adjust your eating plan to your individual calorie needs. This information is not intended to replace advice given to you by your health care provider. Make sure you discuss any questions you have with your health care provider. Document Revised: 06/05/2019 Document Reviewed: 06/05/2019 Elsevier Patient Education  [place].

## 2022-09-27 ENCOUNTER — Telehealth: Payer: Medicare HMO

## 2022-09-28 ENCOUNTER — Ambulatory Visit (INDEPENDENT_AMBULATORY_CARE_PROVIDER_SITE_OTHER): Payer: Medicare HMO | Admitting: Nurse Practitioner

## 2022-09-28 ENCOUNTER — Encounter: Payer: Self-pay | Admitting: Nurse Practitioner

## 2022-09-28 ENCOUNTER — Telehealth: Payer: Self-pay

## 2022-09-28 VITALS — BP 118/80 | HR 70 | Temp 98.4°F | Ht 67.01 in | Wt 189.1 lb

## 2022-09-28 DIAGNOSIS — E782 Mixed hyperlipidemia: Secondary | ICD-10-CM

## 2022-09-28 DIAGNOSIS — I252 Old myocardial infarction: Secondary | ICD-10-CM

## 2022-09-28 DIAGNOSIS — K219 Gastro-esophageal reflux disease without esophagitis: Secondary | ICD-10-CM | POA: Diagnosis not present

## 2022-09-28 DIAGNOSIS — J449 Chronic obstructive pulmonary disease, unspecified: Secondary | ICD-10-CM | POA: Diagnosis not present

## 2022-09-28 DIAGNOSIS — I5022 Chronic systolic (congestive) heart failure: Secondary | ICD-10-CM

## 2022-09-28 DIAGNOSIS — K732 Chronic active hepatitis, not elsewhere classified: Secondary | ICD-10-CM

## 2022-09-28 DIAGNOSIS — D692 Other nonthrombocytopenic purpura: Secondary | ICD-10-CM | POA: Diagnosis not present

## 2022-09-28 DIAGNOSIS — I10 Essential (primary) hypertension: Secondary | ICD-10-CM

## 2022-09-28 DIAGNOSIS — I700 Atherosclerosis of aorta: Secondary | ICD-10-CM | POA: Diagnosis not present

## 2022-09-28 DIAGNOSIS — I2510 Atherosclerotic heart disease of native coronary artery without angina pectoris: Secondary | ICD-10-CM | POA: Diagnosis not present

## 2022-09-28 DIAGNOSIS — Z87891 Personal history of nicotine dependence: Secondary | ICD-10-CM

## 2022-09-28 DIAGNOSIS — I2089 Other forms of angina pectoris: Secondary | ICD-10-CM

## 2022-09-28 MED ORDER — ATORVASTATIN CALCIUM 80 MG PO TABS: 80.0000 mg | ORAL_TABLET | Freq: Every day | ORAL | 4 refills | Status: DC

## 2022-09-28 MED ORDER — PANTOPRAZOLE SODIUM 40 MG PO TBEC: 40.0000 mg | DELAYED_RELEASE_TABLET | Freq: Every day | ORAL | 4 refills | Status: DC

## 2022-09-28 MED ORDER — UMECLIDINIUM-VILANTEROL 62.5-25 MCG/ACT IN AEPB: 1.0000 | INHALATION_SPRAY | Freq: Every day | RESPIRATORY_TRACT | 4 refills | Status: DC

## 2022-09-28 NOTE — Assessment & Plan Note (Signed)
Chronic, ongoing.  Spirometry FEV1/FVC 90% and FEV1 76% in August 2020 - will repeat next visit.   Recommend continued cessation smoking.  Continue current medication regimen, will send out script and forms for [place] assistance.  He refuses Lung CA Screening CT at this time due to current medical bills, recommended he obtain this.  Return in 6 months with repeat spirometry.

## 2022-09-28 NOTE — Telephone Encounter (Signed)
Paperwork for patient assistance is signed and placed in pharmacy folder for pick up

## 2022-09-28 NOTE — Assessment & Plan Note (Signed)
Chronic, stable with Protonix, continue this and check Mag level next visit.  Risks of PPI use were discussed with patient including bone loss, C. Diff diarrhea, pneumonia, infections, CKD, electrolyte abnormalities.  Verbalizes understanding and chooses to continue the medication.

## 2022-09-28 NOTE — Assessment & Plan Note (Signed)
Since STEMI 07/28/20.  Euvolemic.  At this time continue current medication regimen and collaboration with cardiology as needed.  Recommend: - Reminded to call for an overnight weight gain of >2 pounds or a weekly weight gain of >5 pounds - not adding salt to food and read food labels. Reviewed the importance of keeping daily sodium intake to 2000mg  daily.  Highly recommend he cut back on sodium intake as suspect this is causing some edema. - Avoid Ibuprofen products.

## 2022-09-28 NOTE — Progress Notes (Signed)
BP 118/80 Comment: Patients bp cuff  Pulse 70   Temp 98.4 F (36.9 C) (Oral)   Ht 5' 7.01" (1.702 m)   Wt 189 lb 1.6 oz (85.8 kg)   SpO2 98%   BMI 29.61 kg/m    Subjective:    Patient ID: Andrew Potts, male    DOB: 07/26/1950, 72 y.o.   MRN: [id number]  HPI: Andrew Potts is a 72 y.o. male  Chief Complaint  Patient presents with   Chronic active hepatitis   Chronic Systolic heart faliure   COPD   HYPERLIPIDEMIA & HF Continues on Lisinopril 20 MG, Atorvastatin 80 MG, Metoprolol 12.5 MG BID, and Lasix 10 MG daily.  HF clinic last on 02/07/21 -- repeat echo on 01/24/21 noted EF 55 to 60%. History of STEMI 07/28/20.  Saw cardiology on 05/23/22 and they increased Lisinopril to 20 MG, but this made him too dizzy so he has reduced to 1/2 tablet (10 MG) per pharmacy recommendations.  No further dizziness or headaches presenting with 10 MG dosing.   Aortic atherosclerosis noted on imaging 09/20/20. Hyperlipidemia status: good compliance Satisfied with current treatment?  yes Side effects:  no Medication compliance: good compliance Past cholesterol meds: none Supplements: none Aspirin:  no The ASCVD Risk score (Arnett DK, et al., 2019) failed to calculate for the following reasons:   The patient has a prior MI or stroke diagnosis Chest pain:  no Coronary artery disease:  no Family history CAD:  no Family history early CAD:  no   COPD Continues on Symbicort daily (we are changing to [employer] which has assistance program) + [employer].     Has continued to quit smoking since his STEMI.  COPD status: stable Satisfied with current treatment?: yes Oxygen use: no Dyspnea frequency: occasional Cough frequency: occasional Rescue inhaler frequency: rarely Limitation of activity: no Productive cough: none Last Spirometry: FEV1 76% & FEV1/FVC 90% in August 2020 Pneumovax: Up to Date Influenza: Up to Date    HEPATITIS C GI last 01/29/22 and was started on Epclusa, which he has  completed, does not have follow-up and was to return 6-8 weeks after treatment completed to see GI per office note.  Did have blood transfusion in 1973.  No excess alcohol use at home.     Continues on Protonix daily for GERD. Duration since diagnosis: 2020 Hep C transmission: possibly via blood transfusion in 1973 Genotype: 1a Viral load:  340,000 international units/mL. Hepatology evaluation:yes Liver biopsy:no  Cirrhosis: no Antiviral therapy:not yet Hepatocellular carcinoma screening: no Esophageal varices screening/EGD: no Hepatitis A Vaccine: Not up to Date Hepatitis B Vaccine: unknown Pneumovax Vaccine: Up to Date     Relevant past medical, surgical, family and social history reviewed and updated as indicated. Interim medical history since our last visit reviewed. Allergies and medications reviewed and updated.  Review of Systems  Constitutional:  Negative for activity change, diaphoresis, fatigue and fever.  Respiratory:  Positive for shortness of breath (mild with activity on occasion). Negative for cough, chest tightness and wheezing.   Cardiovascular:  Negative for chest pain, palpitations and leg swelling.  Gastrointestinal: Negative.   Endocrine: Negative for cold intolerance, heat intolerance, polydipsia, polyphagia and polyuria.  Neurological: Negative.   Psychiatric/Behavioral: Negative.      Per HPI unless specifically indicated above     Objective:    BP 118/80 Comment: Patients bp cuff  Pulse 70   Temp 98.4 F (36.9 C) (Oral)   Ht 5' 7.01" (1.702 m)  Wt 189 lb 1.6 oz (85.8 kg)   SpO2 98%   BMI 29.61 kg/m   Wt Readings from Last 3 Encounters:  09/28/22 189 lb 1.6 oz (85.8 kg)  04/12/22 189 lb 6.4 oz (85.9 kg)  03/29/22 188 lb 1.6 oz (85.3 kg)    Physical Exam Vitals and nursing note reviewed.  Constitutional:      General: He is awake. He is not in acute distress.    Appearance: He is well-developed and well-groomed. He is not ill-appearing.   HENT:     Head: Normocephalic and atraumatic.     Right Ear: Hearing normal. No drainage.     Left Ear: Hearing normal. No drainage.  Eyes:     General: Lids are normal.        Right eye: No discharge.        Left eye: No discharge.     Conjunctiva/sclera: Conjunctivae normal.     Pupils: Pupils are equal, round, and reactive to light.  Neck:     Thyroid: No thyromegaly.     Vascular: No carotid bruit.     Trachea: Trachea normal.  Cardiovascular:     Rate and Rhythm: Normal rate and regular rhythm.     Heart sounds: Normal heart sounds, S1 normal and S2 normal. No murmur heard.    No gallop.  Pulmonary:     Effort: Pulmonary effort is normal.     Breath sounds: Normal breath sounds.  Abdominal:     General: Bowel sounds are normal. There is no distension.     Palpations: Abdomen is soft. There is no hepatomegaly.     Tenderness: There is no abdominal tenderness.     Comments:    Musculoskeletal:        General: Normal range of motion.     Cervical back: Normal range of motion and neck supple.     Right lower leg: No edema.     Left lower leg: No edema.  Skin:    General: Skin is warm and dry.     Capillary Refill: Capillary refill takes less than 2 seconds.     Comments: Scattered pale purple bruises noted to bilateral upper extremities.  Neurological:     Mental Status: He is alert and oriented to person, place, and time.     Deep Tendon Reflexes: Reflexes are normal and symmetric.  Psychiatric:        Attention and Perception: Attention normal.        Mood and Affect: Mood normal.        Speech: Speech normal.        Behavior: Behavior normal. Behavior is cooperative.        Thought Content: Thought content normal.    Results for orders placed or performed in visit on 04/12/22  Surgical pathology  Result Value Ref Range   SURGICAL PATHOLOGY      SURGICAL PATHOLOGY CASE: [id number] PATIENT: [facility] Surgical Pathology Report     Clinical History:  skin tags, multiple acquired (cm)     FINAL MICROSCOPIC DIAGNOSIS:  A. SKIN TAGS, LEFT NECK AND LEFT CHEST, EXCISION: -  Fibroepithelial polyps with verrucous keratosis.  GROSS DESCRIPTION:  Received in formalin are multiple fragments of tan, crusted cutaneous tissue without distinct lesions.  The specimen is consistent with that of skin tags ranging from 0.3 to 0.5 cm.  The specimen is entirely submitted in 1 block. (KW, 04/17/2022)    Final Diagnosis performed by Tilford Pillar DO.  Electronically signed 04/18/2022 Technical component performed at [employer]. [facility], [place] [address] [place], [place] [address].  Professional component performed at [facility], [place] [address] [place], [place] [address].  Immunohistochemistry Technical component (if applicable) was performed at [facility]. [address] [place] [place], [place], [place] [address].   IMMUNOHISTOCHEMISTRY DISCLAIMER (if applicable): Some of these immunohistochemical stains may have been developed and the performance characteristics determine by [facility]. Some may not have been cleared or approved by the U.S. Food and Drug Administration. The FDA has determined that such clearance or approval is not necessary. This test is used for clinical purposes. It should not be regarded as investigational or for research. This laboratory is certified under the [place] (CLIA-88) as qualified to perform high complexity clinical laboratory testing.  The controls stained appropriately.       Assessment & Plan:   Problem List Items Addressed This Visit       Cardiovascular and Mediastinum   Aortic atherosclerosis ([place])    Chronic.  Noted on past imaging.  Recommend he continue daily statin and ASA for prevention + complete cessation of smoking.      Relevant Medications   lisinopril (ZESTRIL) 10 MG tablet    atorvastatin (LIPITOR) 80 MG tablet   Other Relevant Orders   Comprehensive metabolic panel   Lipid Panel w/o Chol/HDL Ratio   CAD, multiple vessel    Chronic, stable.  Recommend he schedule follow-up with cardiology.  No recent CP.      Relevant Medications   lisinopril (ZESTRIL) 10 MG tablet   atorvastatin (LIPITOR) 80 MG tablet   Chronic systolic heart failure ([place]) - Primary    Since STEMI 07/28/20.  Euvolemic.  At this time continue current medication regimen and collaboration with cardiology as needed.  Recommend: - Reminded to call for an overnight weight gain of >2 pounds or a weekly weight gain of >5 pounds - not adding salt to food and read food labels. Reviewed the importance of keeping daily sodium intake to 2000mg  daily.  Highly recommend he cut back on sodium intake as suspect this is causing some edema. - Avoid Ibuprofen products.      Relevant Medications   lisinopril (ZESTRIL) 10 MG tablet   atorvastatin (LIPITOR) 80 MG tablet   Other Relevant Orders   Comprehensive metabolic panel   Lipid Panel w/o Chol/HDL Ratio   Essential hypertension    Chronic, stable.  BP well at goal today, suspect the recent increase by cardiology from 5 MG Lisinopril to 20 MG caused hypotension due to patient dizziness with change. Recommend he continue 10 MG dosing which he is tolerating.  Recommend he monitor BP at home three mornings a week and document for provider, educated him on this.  Continue diet and exercise focus, DASH diet.  Labs: CMP.  Urine ALB 10 (September 2023).  Continue current medication regimen and collaboration with cardiology as needed.  Praised for ongoing adherence to regimen.      Relevant Medications   lisinopril (ZESTRIL) 10 MG tablet   atorvastatin (LIPITOR) 80 MG tablet   Other Relevant Orders   Comprehensive metabolic panel   Senile purpura (HCC)    Chronic.  As evidenced by bruising upper extremities with ASA use.  Recommend gentle skin care and monitor for  wounds, if present immediately notify provider.      Relevant Medications   lisinopril (ZESTRIL) 10 MG tablet  atorvastatin (LIPITOR) 80 MG tablet     Respiratory   COPD (chronic obstructive pulmonary disease) (HCC)    Chronic, ongoing.  Spirometry FEV1/FVC 90% and FEV1 76% in August 2020 - will repeat next visit.   Recommend continued cessation smoking.  Continue current medication regimen, will send out script and forms for [employer] assistance.  He refuses Lung CA Screening CT at this time due to current medical bills, recommended he obtain this.  Return in 6 months with repeat spirometry.      Relevant Medications   umeclidinium-vilanterol (ANORO ELLIPTA) 62.5-25 MCG/ACT AEPB     Digestive   Chronic active hepatitis (HCC)    Chronic, ongoing, completed treatment.  He has restarted Atorvastatin.  Continue collaboration with GI, provided him number to call and schedule with them.      Relevant Orders   Comprehensive metabolic panel   Gastroesophageal reflux disease without esophagitis    Chronic, stable with Protonix, continue this and check Mag level next visit.  Risks of PPI use were discussed with patient including bone loss, C. Diff diarrhea, pneumonia, infections, CKD, electrolyte abnormalities.  Verbalizes understanding and chooses to continue the medication.       Relevant Medications   pantoprazole (PROTONIX) 40 MG tablet     Other   Former cigarette smoker    Currently continues not to smoke, recommend continue cessation post MI.       History of ST elevation myocardial infarction (STEMI)    Stable at this time, event took place 07/28/20.  Recommend he continue all current medications and collaboration with cardiology.       HLD (hyperlipidemia)    Chronic, ongoing.  Statin on hold at this time due to Hep C treatment, will restart once completed.  Lipid panel today.      Relevant Medications   lisinopril (ZESTRIL) 10 MG tablet   atorvastatin (LIPITOR) 80 MG tablet    Other Relevant Orders   Comprehensive metabolic panel   Lipid Panel w/o Chol/HDL Ratio     Follow up plan: Return in about 6 months (around 03/31/2023) for Annual Physical -- after 03/30/23 with spirometry.

## 2022-09-28 NOTE — Assessment & Plan Note (Signed)
Currently continues not to smoke, recommend continue cessation post MI.

## 2022-09-28 NOTE — Assessment & Plan Note (Signed)
Chronic.  As evidenced by bruising upper extremities with ASA use.  Recommend gentle skin care and monitor for wounds, if present immediately notify provider.

## 2022-09-28 NOTE — Assessment & Plan Note (Signed)
Chronic, ongoing.  Statin on hold at this time due to Hep C treatment, will restart once completed.  Lipid panel today.

## 2022-09-28 NOTE — Assessment & Plan Note (Signed)
Chronic.  Noted on past imaging.  Recommend he continue daily statin and ASA for prevention + complete cessation of smoking.

## 2022-09-28 NOTE — Assessment & Plan Note (Signed)
Chronic, stable.  BP well at goal today, suspect the recent increase by cardiology from 5 MG Lisinopril to 20 MG caused hypotension due to patient dizziness with change. Recommend he continue 10 MG dosing which he is tolerating.  Recommend he monitor BP at home three mornings a week and document for provider, educated him on this.  Continue diet and exercise focus, DASH diet.  Labs: CMP.  Urine ALB 10 (September 2023).  Continue current medication regimen and collaboration with cardiology as needed.  Praised for ongoing adherence to regimen.

## 2022-09-28 NOTE — Assessment & Plan Note (Signed)
Stable at this time, event took place 07/28/20.  Recommend he continue all current medications and collaboration with cardiology.

## 2022-09-28 NOTE — Assessment & Plan Note (Signed)
Chronic, stable.  Recommend he schedule follow-up with cardiology.  No recent CP.

## 2022-09-28 NOTE — Assessment & Plan Note (Signed)
Chronic, ongoing, completed treatment.  He has restarted Atorvastatin.  Continue collaboration with GI, provided him number to call and schedule with them.

## 2022-09-29 LAB — COMPREHENSIVE METABOLIC PANEL
ALT: 15 IU/L (ref 0–44)
AST: 19 IU/L (ref 0–40)
Albumin/Globulin Ratio: 1.6 (ref 1.2–2.2)
Albumin: 4.4 g/dL (ref 3.9–4.9)
Alkaline Phosphatase: 98 IU/L (ref 44–121)
BUN/Creatinine Ratio: 11 (ref 10–24)
BUN: 9 mg/dL (ref 8–27)
Bilirubin Total: 1.4 mg/dL — ABNORMAL HIGH (ref 0.0–1.2)
CO2: 25 mmol/L (ref 20–29)
Calcium: 9.5 mg/dL (ref 8.6–10.2)
Chloride: 101 mmol/L (ref 96–106)
Creatinine, Ser: 0.84 mg/dL (ref 0.76–1.27)
Globulin, Total: 2.7 g/dL (ref 1.5–4.5)
Glucose: 102 mg/dL — ABNORMAL HIGH (ref 70–99)
Potassium: 3.9 mmol/L (ref 3.5–5.2)
Sodium: 140 mmol/L (ref 134–144)
Total Protein: 7.1 g/dL (ref 6.0–8.5)
eGFR: 94 mL/min/{1.73_m2} (ref >59)

## 2022-09-29 LAB — LIPID PANEL W/O CHOL/HDL RATIO
Cholesterol, Total: 133 mg/dL (ref 100–199)
HDL: 40 mg/dL (ref >39)
LDL Chol Calc (NIH): 68 mg/dL (ref 0–99)
Triglycerides: 141 mg/dL (ref 0–149)
VLDL Cholesterol Cal: 25 mg/dL (ref 5–40)

## 2022-09-29 NOTE — Progress Notes (Signed)
Contacted via MyChart   Good evening Siddiq, your labs have returned: - Kidney function, creatinine and eGFR, remains normal, as is liver function, AST and ALT.  - Cholesterol labs at goal, continue your Atorvastatin daily.  Overall very good labs.  Any questions? Keep being amazing!!  Thank you for allowing me to participate in your care.  I appreciate you. Kindest regards, Lynnex Fulp

## 2022-10-02 ENCOUNTER — Ambulatory Visit (INDEPENDENT_AMBULATORY_CARE_PROVIDER_SITE_OTHER): Payer: Medicare HMO

## 2022-10-02 ENCOUNTER — Telehealth: Payer: Medicare HMO

## 2022-10-02 DIAGNOSIS — J449 Chronic obstructive pulmonary disease, unspecified: Secondary | ICD-10-CM

## 2022-10-02 DIAGNOSIS — I10 Essential (primary) hypertension: Secondary | ICD-10-CM

## 2022-10-02 DIAGNOSIS — I5022 Chronic systolic (congestive) heart failure: Secondary | ICD-10-CM

## 2022-10-02 NOTE — Patient Instructions (Signed)
Please call the care guide team at [phone] if you need to cancel or reschedule your appointment.   If you are experiencing a Mental Health or [place] or need someone to talk to, please call the Suicide and Crisis Lifeline: 988 call the [place] National Suicide Prevention Lifeline: 6230765560 or TTY: 4183610539 TTY (929) 183-3594) to talk to a trained counselor call 1-800-273-TALK (toll free, 24 hour hotline)   Following is a copy of the CCM Program Consent:  CCM service includes personalized support from designated clinical staff supervised by the physician, including individualized plan of care and coordination with other care providers 24/7 contact phone numbers for assistance for urgent and routine care needs. Service will only be billed when office clinical staff spend 20 minutes or more in a month to coordinate care. Only one practitioner may furnish and bill the service in a calendar month. The patient may stop CCM services at amy time (effective at the end of the month) by phone call to the office staff. The patient will be responsible for cost sharing (co-pay) or up to 20% of the service fee (after annual deductible is met)  Following is a copy of your full provider care plan:   Goals Addressed             This Visit's Progress    CCM Expected Outcome:  Monitor, Self-Manage and Reduce Symptoms of Heart Failure       Current Barriers:  Chronic Disease Management support and education needs related to effective management of HF  Wt Readings from Last 3 Encounters:  09/28/22 189 lb 1.6 oz (85.8 kg)  04/12/22 189 lb 6.4 oz (85.9 kg)  03/29/22 188 lb 1.6 oz (85.3 kg)    Planned Interventions: Basic overview and discussion of pathophysiology of Heart Failure reviewed. The patient has a good understanding of his heart failure and knows changes to watch for. His weight is stable and he denies any acute changes in his HF or heart health Provided education on  low sodium diet. Review and education provided Reviewed Heart Failure Action Plan in depth and provided written copy Assessed need for readable accurate scales in home. Has scales, weighs daily, weight stays around 185 recently. He knows to monitor for swelling and edema Provided education about placing scale on hard, flat surface Advised patient to weigh each morning after emptying bladder Discussed importance of daily weight and advised patient to weigh and record daily Reviewed role of diuretics in prevention of fluid overload and management of heart failure Discussed the importance of keeping all appointments with provider. Saw cardiology this year. Will see again in October. Sees the pcp on a regular basis. Education and support given.  Provided patient with education about the role of exercise in the management of heart failure Advised patient to discuss changes in weight, changes in swelling/edema in feet/legs/abdomen, new questions or concerns about heart failure or heart health with provider Screening for signs and symptoms of depression related to chronic disease state  Assessed social determinant of health barriers  Symptom Management: Take medications as prescribed   Attend all scheduled provider appointments Call provider office for new concerns or questions  call the Suicide and Crisis Lifeline: 988 call the [place] National Suicide Prevention Lifeline: 320-690-8578 or TTY: (201)390-1807 TTY [phone]) to talk to a trained counselor call 1-800-273-TALK (toll free, 24 hour hotline) if experiencing a Mental Health or [place]  call office if I gain more than 2 pounds in one day or  5 pounds in one week track weight in diary use salt in moderation watch for swelling in feet, ankles and legs every day weigh myself daily bring diary to all appointments develop a rescue plan follow rescue plan if symptoms flare-up know when to call the doctorfor changes in heart  failure, or acute weight changes track symptoms and what helps feel better or worse dress right for the weather, hot or cold  Follow Up Plan: Telephone follow up appointment with care management team member scheduled for: 11-27-2022 at 1145 am       CCM Expected Outcome:  Monitor, Self-Manage, and Reduce Symptoms of Hypertension       Current Barriers:  Knowledge Deficits related to normalized blood pressures and the need to maintain systolic blood pressures of 123456 and diastolic 0000000 Care Coordination needs related to the proper use of blood pressure cuff in a patient with HTN Chronic Disease Management support and education needs related to effective management of HTN  BP Readings from Last 3 Encounters:  09/28/22 118/80  04/12/22 138/80  03/29/22 132/72    Planned Interventions: Evaluation of current treatment plan related to hypertension self management and patient's adherence to plan as established by provider. The patient states that he is doing well. He did take his blood pressure cuff to the office and they showed him how to take his blood pressure it was reading slightly lower than the office device. Education provided.  Provided education to patient re: stroke prevention, s/s of heart attack and stroke. The patient has had a HA in the past. Is mindful of taking his medications and monitoring for changes in his health and well being; Reviewed prescribed diet heart healthy diet. Review and education provided. Reviewed medications with patient and discussed importance of compliance. The patient denies any issues with orthostatic hypotension since having changes in medications. The patient knows about side effects to look for. Education and support given. Has upcoming appointments with the pharm D for ongoing support and effective medications management. Discussed plans with patient for ongoing care management follow up and provided patient with direct contact information for care  management team; Advised patient, providing education and rationale, to monitor blood pressure daily and record, calling PCP for findings outside established parameters. Education on the goal of systolic blood pressures of 123456 and diastolic pressures of 0000000 Reviewed scheduled/upcoming provider appointments including: 04-01-2023 at 1 pm, knows to call for changes or new needs before next pcp appointment Advised patient to discuss changes in blood pressures, and review of his home monitor in the office compared to the office monitor with provider; Provided education on prescribed diet heart healthy;  Discussed complications of poorly controlled blood pressure such as heart disease, stroke, circulatory complications, vision complications, kidney impairment, sexual dysfunction;  Screening for signs and symptoms of depression related to chronic disease state;  Assessed social determinant of health barriers;   Symptom Management: Take medications as prescribed   Attend all scheduled provider appointments Call provider office for new concerns or questions  call the Suicide and Crisis Lifeline: 988 call the [place] National Suicide Prevention Lifeline: 901-637-1553 or TTY: (630)592-7199 TTY 772 301 6947) to talk to a trained counselor call 1-800-273-TALK (toll free, 24 hour hotline) if experiencing a Mental Health or [place]  check blood pressure 3 times per week write blood pressure results in a log or diary learn about high blood pressure keep a blood pressure log take blood pressure log to all doctor appointments call doctor for signs  and symptoms of high blood pressure develop an action plan for high blood pressure keep all doctor appointments take medications for blood pressure exactly as prescribed report new symptoms to your doctor  Follow Up Plan: Telephone follow up appointment with care management team member scheduled for: 11-27-2022 at 1145 am       CCM:  Maintain,  Monitor and Self-Manage Symptoms of COPD       Current Barriers:  Care Coordination needs related to supply of Symbicort and paperwork that the patient has to get Anoro approved in a patient with COPD Chronic Disease Management support and education needs related to effective management of COPD  Planned Interventions: Provided patient with basic written and verbal COPD education on self care/management/and exacerbation prevention. The patient is stable. States he has to be careful with all the weather changes. Does notice changes in his breathing when he ties his shoes. The patient states that he is no longer smoking. Advised patient to track and manage COPD triggers. Review of triggers and the patient knows how to manage changes.  Provided written and verbal instructions on pursed lip breathing and utilized returned demonstration as teach back Provided instruction about proper use of medications used for management of COPD including inhalers. The patient states after seeing pcp that her received more Symbicort in the mail. He has an 8 month supply. The patient has not filled out the paperwork for Anoro PAP for 2024 because he wanted to talk to pcp about Symbicort supply. The patient has his Anoro paperwork and will fill it out and take it in with him to see the pcp in March. The patient still has a good supply of his Symbicort. Education and support given.  Advised patient to self assesses COPD action plan zone and make appointment with provider if in the yellow zone for 48 hours without improvement Advised patient to engage in light exercise as tolerated 3-5 days a week to aid in the the management of COPD Provided education about and advised patient to utilize infection prevention strategies to reduce risk of respiratory infection. Review and education provided. The patient is doing well and staying in especially with weather changes. Discussed the importance of adequate rest and management of  fatigue with COPD Screening for signs and symptoms of depression related to chronic disease state  Assessed social determinant of health barriers Vaccinations are up to date  Symptom Management: Take medications as prescribed   Attend all scheduled provider appointments Call provider office for new concerns or questions  call the Suicide and Crisis Lifeline: 988 call the [place] National Suicide Prevention Lifeline: (631) 568-3094 or TTY: (947)811-7491 TTY 8607388041) to talk to a trained counselor call 1-800-273-TALK (toll free, 24 hour hotline) if experiencing a Mental Health or [place]  eliminate smoking in my home identify and remove indoor air pollutants limit outdoor activity during cold weather listen for public air quality announcements every day do breathing exercises every day develop a rescue plan eliminate symptom triggers at home follow rescue plan if symptoms flare-up  Follow Up Plan: Telephone follow up appointment with care management team member scheduled for: 11-27-2022 at 1145 am          Patient verbalizes understanding of instructions and care plan provided today and agrees to view in [place]. Active MyChart status and patient understanding of how to access instructions and care plan via MyChart confirmed with patient.     Telephone follow up appointment with care management team member scheduled for: 11-27-2022  at 1145 am

## 2022-10-02 NOTE — Chronic Care Management (AMB) (Signed)
Chronic Care Management   CCM RN Visit Note  10/02/2022 Name: Andrew Potts MRN: [id number] DOB: 08-Dec-1950  Subjective: Andrew Potts is a 72 y.o. year old male who is a primary care patient of [name], Barbaraann Faster, NP. The patient was referred to the Chronic Care Management team for assistance with care management needs subsequent to provider initiation of CCM services and plan of care.    Today's Visit:  Engaged with patient by telephone for follow up visit.        Goals Addressed             This Visit's Progress    CCM Expected Outcome:  Monitor, Self-Manage and Reduce Symptoms of Heart Failure       Current Barriers:  Chronic Disease Management support and education needs related to effective management of HF  Wt Readings from Last 3 Encounters:  09/28/22 189 lb 1.6 oz (85.8 kg)  04/12/22 189 lb 6.4 oz (85.9 kg)  03/29/22 188 lb 1.6 oz (85.3 kg)    Planned Interventions: Basic overview and discussion of pathophysiology of Heart Failure reviewed. The patient has a good understanding of his heart failure and knows changes to watch for. His weight is stable and he denies any acute changes in his HF or heart health Provided education on low sodium diet. Review and education provided Reviewed Heart Failure Action Plan in depth and provided written copy Assessed need for readable accurate scales in home. Has scales, weighs daily, weight stays around 185 recently. He knows to monitor for swelling and edema Provided education about placing scale on hard, flat surface Advised patient to weigh each morning after emptying bladder Discussed importance of daily weight and advised patient to weigh and record daily Reviewed role of diuretics in prevention of fluid overload and management of heart failure Discussed the importance of keeping all appointments with provider. Saw cardiology this year. Will see again in October. Sees the pcp on a regular basis. Education and support given.   Provided patient with education about the role of exercise in the management of heart failure Advised patient to discuss changes in weight, changes in swelling/edema in feet/legs/abdomen, new questions or concerns about heart failure or heart health with provider Screening for signs and symptoms of depression related to chronic disease state  Assessed social determinant of health barriers  Symptom Management: Take medications as prescribed   Attend all scheduled provider appointments Call provider office for new concerns or questions  call the Suicide and Crisis Lifeline: 988 call the [place] National Suicide Prevention Lifeline: 760-440-3590 or TTY: 318-828-5722 TTY 223-198-9145) to talk to a trained counselor call 1-800-273-TALK (toll free, 24 hour hotline) if experiencing a Mental Health or [place]  call office if I gain more than 2 pounds in one day or 5 pounds in one week track weight in diary use salt in moderation watch for swelling in feet, ankles and legs every day weigh myself daily bring diary to all appointments develop a rescue plan follow rescue plan if symptoms flare-up know when to call the doctorfor changes in heart failure, or acute weight changes track symptoms and what helps feel better or worse dress right for the weather, hot or cold  Follow Up Plan: Telephone follow up appointment with care management team member scheduled for: 11-27-2022 at 1145 am       CCM Expected Outcome:  Monitor, Self-Manage, and Reduce Symptoms of Hypertension       Current Barriers:  Knowledge  Deficits related to normalized blood pressures and the need to maintain systolic blood pressures of 123456 and diastolic 0000000 Care Coordination needs related to the proper use of blood pressure cuff in a patient with HTN Chronic Disease Management support and education needs related to effective management of HTN  BP Readings from Last 3 Encounters:  09/28/22 118/80  04/12/22  138/80  03/29/22 132/72    Planned Interventions: Evaluation of current treatment plan related to hypertension self management and patient's adherence to plan as established by provider. The patient states that he is doing well. He did take his blood pressure cuff to the office and they showed him how to take his blood pressure it was reading slightly lower than the office device. Education provided.  Provided education to patient re: stroke prevention, s/s of heart attack and stroke. The patient has had a HA in the past. Is mindful of taking his medications and monitoring for changes in his health and well being; Reviewed prescribed diet heart healthy diet. Review and education provided. Reviewed medications with patient and discussed importance of compliance. The patient denies any issues with orthostatic hypotension since having changes in medications. The patient knows about side effects to look for. Education and support given. Has upcoming appointments with the pharm D for ongoing support and effective medications management. Discussed plans with patient for ongoing care management follow up and provided patient with direct contact information for care management team; Advised patient, providing education and rationale, to monitor blood pressure daily and record, calling PCP for findings outside established parameters. Education on the goal of systolic blood pressures of 123456 and diastolic pressures of 0000000 Reviewed scheduled/upcoming provider appointments including: 04-01-2023 at 1 pm, knows to call for changes or new needs before next pcp appointment Advised patient to discuss changes in blood pressures, and review of his home monitor in the office compared to the office monitor with provider; Provided education on prescribed diet heart healthy;  Discussed complications of poorly controlled blood pressure such as heart disease, stroke, circulatory complications, vision complications, kidney  impairment, sexual dysfunction;  Screening for signs and symptoms of depression related to chronic disease state;  Assessed social determinant of health barriers;   Symptom Management: Take medications as prescribed   Attend all scheduled provider appointments Call provider office for new concerns or questions  call the Suicide and Crisis Lifeline: 988 call the [place] National Suicide Prevention Lifeline: 706-366-5687 or TTY: (512) 094-8703 TTY 856-314-3364) to talk to a trained counselor call 1-800-273-TALK (toll free, 24 hour hotline) if experiencing a Mental Health or [place]  check blood pressure 3 times per week write blood pressure results in a log or diary learn about high blood pressure keep a blood pressure log take blood pressure log to all doctor appointments call doctor for signs and symptoms of high blood pressure develop an action plan for high blood pressure keep all doctor appointments take medications for blood pressure exactly as prescribed report new symptoms to your doctor  Follow Up Plan: Telephone follow up appointment with care management team member scheduled for: 11-27-2022 at 1145 am       CCM:  Maintain, Monitor and Self-Manage Symptoms of COPD       Current Barriers:  Care Coordination needs related to supply of Symbicort and paperwork that the patient has to get Anoro approved in a patient with COPD Chronic Disease Management support and education needs related to effective management of COPD  Planned Interventions: Provided patient with basic  written and verbal COPD education on self care/management/and exacerbation prevention. The patient is stable. States he has to be careful with all the weather changes. Does notice changes in his breathing when he ties his shoes. The patient states that he is no longer smoking. Advised patient to track and manage COPD triggers. Review of triggers and the patient knows how to manage changes.  Provided  written and verbal instructions on pursed lip breathing and utilized returned demonstration as teach back Provided instruction about proper use of medications used for management of COPD including inhalers. The patient states after seeing pcp that her received more Symbicort in the mail. He has an 8 month supply. The patient has not filled out the paperwork for Anoro PAP for 2024 because he wanted to talk to pcp about Symbicort supply. The patient has his Anoro paperwork and will fill it out and take it in with him to see the pcp in March. The patient still has a good supply of his Symbicort. Education and support given.  Advised patient to self assesses COPD action plan zone and make appointment with provider if in the yellow zone for 48 hours without improvement Advised patient to engage in light exercise as tolerated 3-5 days a week to aid in the the management of COPD Provided education about and advised patient to utilize infection prevention strategies to reduce risk of respiratory infection. Review and education provided. The patient is doing well and staying in especially with weather changes. Discussed the importance of adequate rest and management of fatigue with COPD Screening for signs and symptoms of depression related to chronic disease state  Assessed social determinant of health barriers Vaccinations are up to date  Symptom Management: Take medications as prescribed   Attend all scheduled provider appointments Call provider office for new concerns or questions  call the Suicide and Crisis Lifeline: 988 call the [place] National Suicide Prevention Lifeline: 905-861-8281 or TTY: 240-247-2753 TTY 939-546-1086) to talk to a trained counselor call 1-800-273-TALK (toll free, 24 hour hotline) if experiencing a Mental Health or [place]  eliminate smoking in my home identify and remove indoor air pollutants limit outdoor activity during cold weather listen for public air  quality announcements every day do breathing exercises every day develop a rescue plan eliminate symptom triggers at home follow rescue plan if symptoms flare-up  Follow Up Plan: Telephone follow up appointment with care management team member scheduled for: 11-27-2022 at 1145 am          Plan:Telephone follow up appointment with care management team member scheduled for:  11-27-2022 at 1145 am  Noreene Larsson RN, MSN, CCM RN Care Manager  Chronic Care Management Direct Number: [phone]

## 2022-10-10 ENCOUNTER — Other Ambulatory Visit: Payer: Self-pay | Admitting: Nurse Practitioner

## 2022-10-10 MED ORDER — UMECLIDINIUM-VILANTEROL 62.5-25 MCG/ACT IN AEPB: 1.0000 | INHALATION_SPRAY | Freq: Every day | RESPIRATORY_TRACT | 4 refills | Status: DC
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
## 2022-10-11 NOTE — Patient Outreach (Signed)
Spoke with Threasa Beards at his pharmacy, Andrew Potts is now $0.. Called to let patient know

## 2022-10-14 DIAGNOSIS — J449 Chronic obstructive pulmonary disease, unspecified: Secondary | ICD-10-CM | POA: Diagnosis not present

## 2022-10-14 DIAGNOSIS — I5020 Unspecified systolic (congestive) heart failure: Secondary | ICD-10-CM | POA: Diagnosis not present

## 2022-10-14 DIAGNOSIS — I110 Hypertensive heart disease with heart failure: Secondary | ICD-10-CM | POA: Diagnosis not present

## 2022-10-15 ENCOUNTER — Telehealth: Payer: Medicare HMO

## 2022-10-15 ENCOUNTER — Ambulatory Visit: Payer: Medicare HMO

## 2022-10-30 ENCOUNTER — Other Ambulatory Visit: Payer: Self-pay | Admitting: Nurse Practitioner

## 2022-10-30 NOTE — Telephone Encounter (Signed)
Medication Refill - Medication: lisinopril (ZESTRIL) 10 MG tablet   Has the patient contacted their pharmacy? Yes.    Pt was advised to reach out to the PCP.    Preferred Pharmacy (with phone number or street name):  [facility] - [place], [place] - [address] Phone: [phone]  Fax: [phone]     Has the patient been seen for an appointment in the last year OR does the patient have an upcoming appointment? Yes.    Agent: Please be advised that RX refills may take up to 3 business days. We ask that you follow-up with your pharmacy.

## 2022-10-31 NOTE — Telephone Encounter (Signed)
Requested medication (s) are due for refill today: yes  Requested medication (s) are on the active medication list: yes  Last refill:  09/28/22  Future visit scheduled: yes  Notes to clinic:  historical med. Unable to refill per protocol, last refill by another provider.    Requested Prescriptions  Pending Prescriptions Disp Refills   lisinopril (ZESTRIL) 10 MG tablet      Sig: Take 1 tablet (10 mg total) by mouth daily.     Cardiovascular:  ACE Inhibitors Passed - 10/30/2022 11:45 AM      Passed - Cr in normal range and within 180 days    Creatinine, Ser  Date Value Ref Range Status  09/28/2022 0.84 0.76 - 1.27 mg/dL Final         Passed - K in normal range and within 180 days    Potassium  Date Value Ref Range Status  09/28/2022 3.9 3.5 - 5.2 mmol/L Final         Passed - Patient is not pregnant      Passed - Last BP in normal range    BP Readings from Last 1 Encounters:  09/28/22 118/80         Passed - Valid encounter within last 6 months    Recent Outpatient Visits           1 month ago Chronic systolic heart failure (HCC)   [facility] [facility] [place], Corrie Dandy T, NP   6 months ago Skin tags, multiple acquired   [place] [facility] [place], Corrie Dandy T, NP   7 months ago Chronic active hepatitis (HCC)   [place] [facility] [place], Corrie Dandy T, NP   1 year ago Chronic systolic heart failure (HCC)   [facility] [place], Corrie Dandy T, NP   1 year ago Chronic obstructive pulmonary disease, unspecified COPD type (HCC)   [place] [facility] [place], Dorie Rank, NP       Future Appointments             In 5 months [name], Dorie Rank, NP [place] [facility]

## 2022-11-01 DIAGNOSIS — I252 Old myocardial infarction: Secondary | ICD-10-CM | POA: Diagnosis not present

## 2022-11-01 DIAGNOSIS — I110 Hypertensive heart disease with heart failure: Secondary | ICD-10-CM | POA: Diagnosis not present

## 2022-11-01 DIAGNOSIS — N529 Male erectile dysfunction, unspecified: Secondary | ICD-10-CM | POA: Diagnosis not present

## 2022-11-01 DIAGNOSIS — J449 Chronic obstructive pulmonary disease, unspecified: Secondary | ICD-10-CM | POA: Diagnosis not present

## 2022-11-01 DIAGNOSIS — E785 Hyperlipidemia, unspecified: Secondary | ICD-10-CM | POA: Diagnosis not present

## 2022-11-01 DIAGNOSIS — Z87891 Personal history of nicotine dependence: Secondary | ICD-10-CM | POA: Diagnosis not present

## 2022-11-01 DIAGNOSIS — Z825 Family history of asthma and other chronic lower respiratory diseases: Secondary | ICD-10-CM | POA: Diagnosis not present

## 2022-11-01 DIAGNOSIS — K219 Gastro-esophageal reflux disease without esophagitis: Secondary | ICD-10-CM | POA: Diagnosis not present

## 2022-11-01 DIAGNOSIS — Z8249 Family history of ischemic heart disease and other diseases of the circulatory system: Secondary | ICD-10-CM | POA: Diagnosis not present

## 2022-11-01 DIAGNOSIS — Z885 Allergy status to narcotic agent status: Secondary | ICD-10-CM | POA: Diagnosis not present

## 2022-11-01 DIAGNOSIS — Z811 Family history of alcohol abuse and dependence: Secondary | ICD-10-CM | POA: Diagnosis not present

## 2022-11-01 DIAGNOSIS — I509 Heart failure, unspecified: Secondary | ICD-10-CM | POA: Diagnosis not present

## 2022-11-01 MED ORDER — LISINOPRIL 10 MG PO TABS: 10.0000 mg | ORAL_TABLET | Freq: Every day | ORAL | 4 refills | Status: DC

## 2022-11-19 ENCOUNTER — Ambulatory Visit: Payer: Medicare HMO
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
## 2022-11-26 ENCOUNTER — Other Ambulatory Visit: Payer: Self-pay | Admitting: Nurse Practitioner

## 2022-11-27 ENCOUNTER — Ambulatory Visit (INDEPENDENT_AMBULATORY_CARE_PROVIDER_SITE_OTHER): Payer: Medicare HMO

## 2022-11-27 ENCOUNTER — Telehealth: Payer: Medicare HMO

## 2022-11-27 DIAGNOSIS — I5022 Chronic systolic (congestive) heart failure: Secondary | ICD-10-CM

## 2022-11-27 DIAGNOSIS — I10 Essential (primary) hypertension: Secondary | ICD-10-CM

## 2022-11-27 DIAGNOSIS — J449 Chronic obstructive pulmonary disease, unspecified: Secondary | ICD-10-CM

## 2022-11-27 NOTE — Chronic Care Management (AMB) (Signed)
Chronic Care Management   CCM RN Visit Note  11/27/2022 Name: Andrew Potts MRN: [id number] DOB: 1950-07-24  Subjective: Andrew Potts is a 72 y.o. year old male who is a primary care patient of [name], Dorie Rank, NP. The patient was referred to the Chronic Care Management team for assistance with care management needs subsequent to provider initiation of CCM services and plan of care.    Today's Visit:  Engaged with patient by telephone for follow up visit.        Goals Addressed             This Visit's Progress    CCM Expected Outcome:  Monitor, Self-Manage and Reduce Symptoms of Heart Failure       Current Barriers:  Chronic Disease Management support and education needs related to effective management of HF  Wt Readings from Last 3 Encounters:  09/28/22 189 lb 1.6 oz (85.8 kg)  04/12/22 189 lb 6.4 oz (85.9 kg)  03/29/22 188 lb 1.6 oz (85.3 kg)    Planned Interventions: Basic overview and discussion of pathophysiology of Heart Failure reviewed. The patient has a good understanding of his heart failure and knows changes to watch for. His weight is stable and he denies any acute changes in his HF or heart health Provided education on low sodium diet. Review and education provided. States he has gained weight but he is exercising and trying to stay around 185-186 Reviewed Heart Failure Action Plan in depth and provided written copy Assessed need for readable accurate scales in home. Has scales, weighs daily, weight stays around 185 recently. He knows to monitor for swelling and edema Provided education about placing scale on hard, flat surface Advised patient to weigh each morning after emptying bladder Discussed importance of daily weight and advised patient to weigh and record daily Reviewed role of diuretics in prevention of fluid overload and management of heart failure. Review of Lasix and taking his medications. Denies any issues with medication compliance and talked  to the pharm D recently. Will continue to monitor Discussed the importance of keeping all appointments with provider. Saw cardiology this year. Will see again in October. Sees the pcp on a regular basis. Education and support given. Next appointment with the pcp on 04-01-2023 at 1 pm  Provided patient with education about the role of exercise in the management of heart failure Advised patient to discuss changes in weight, changes in swelling/edema in feet/legs/abdomen, new questions or concerns about heart failure or heart health with provider Screening for signs and symptoms of depression related to chronic disease state  Assessed social determinant of health barriers  Symptom Management: Take medications as prescribed   Attend all scheduled provider appointments Call provider office for new concerns or questions  call the Suicide and Crisis Lifeline: 988 call the [place] National Suicide Prevention Lifeline: 510-383-7782 or TTY: 709-546-3819 TTY 7721759806) to talk to a trained counselor call 1-800-273-TALK (toll free, 24 hour hotline) if experiencing a Mental Health or Behavioral Health Crisis  call office if I gain more than 2 pounds in one day or 5 pounds in one week track weight in diary use salt in moderation watch for swelling in feet, ankles and legs every day weigh myself daily bring diary to all appointments develop a rescue plan follow rescue plan if symptoms flare-up know when to call the doctorfor changes in heart failure, or acute weight changes track symptoms and what helps feel better or worse dress right for the weather,  hot or cold  Follow Up Plan: Telephone follow up appointment with care management team member scheduled for: 01-29-2023 at 1145 am       CCM Expected Outcome:  Monitor, Self-Manage, and Reduce Symptoms of Hypertension       Current Barriers:  Knowledge Deficits related to normalized blood pressures and the need to maintain systolic blood pressures of  <150 and diastolic <90 Care Coordination needs related to the proper use of blood pressure cuff in a patient with HTN Chronic Disease Management support and education needs related to effective management of HTN  BP Readings from Last 3 Encounters:  09/28/22 118/80  04/12/22 138/80  03/29/22 132/72    Planned Interventions: Evaluation of current treatment plan related to hypertension self management and patient's adherence to plan as established by provider. The patient states that he is doing well. He did take his blood pressure cuff to the office and they showed him how to take his blood pressure it was reading slightly lower than the office device. Education provided.  Provided education to patient re: stroke prevention, s/s of heart attack and stroke. The patient has had a HA in the past. Is mindful of taking his medications and monitoring for changes in his health and well being. Spoke with pharm D recently; Reviewed prescribed diet heart healthy diet. Review and education provided. Reviewed medications with patient and discussed importance of compliance. The patient denies any issues with orthostatic hypotension since having changes in medications. The patient knows about side effects to look for. Education and support given. Has upcoming appointments with the pharm D for ongoing support and effective medications management. Discussed plans with patient for ongoing care management follow up and provided patient with direct contact information for care management team; Advised patient, providing education and rationale, to monitor blood pressure daily and record, calling PCP for findings outside established parameters. Education on the goal of systolic blood pressures of <150 and diastolic pressures of <90 Reviewed scheduled/upcoming provider appointments including: 04-01-2023 at 1 pm, knows to call for changes or new needs before next pcp appointment Advised patient to discuss changes in blood  pressures, and review of his home monitor in the office compared to the office monitor with provider; Provided education on prescribed diet heart healthy;  Discussed complications of poorly controlled blood pressure such as heart disease, stroke, circulatory complications, vision complications, kidney impairment, sexual dysfunction;  Screening for signs and symptoms of depression related to chronic disease state;  Assessed social determinant of health barriers;   Symptom Management: Take medications as prescribed   Attend all scheduled provider appointments Call provider office for new concerns or questions  call the Suicide and Crisis Lifeline: 988 call the [place] National Suicide Prevention Lifeline: 870-249-7233 or TTY: (307)856-1058 TTY 626-848-9488) to talk to a trained counselor call 1-800-273-TALK (toll free, 24 hour hotline) if experiencing a Mental Health or Behavioral Health Crisis  check blood pressure 3 times per week write blood pressure results in a log or diary learn about high blood pressure keep a blood pressure log take blood pressure log to all doctor appointments call doctor for signs and symptoms of high blood pressure develop an action plan for high blood pressure keep all doctor appointments take medications for blood pressure exactly as prescribed report new symptoms to your doctor  Follow Up Plan: Telephone follow up appointment with care management team member scheduled for: 01-29-2023 at 1145 am       CCM:  Maintain, Monitor and Self-Manage Symptoms  of COPD       Current Barriers:  Care Coordination needs related to supply of Symbicort and paperwork that the patient has to get Anoro approved in a patient with COPD Chronic Disease Management support and education needs related to effective management of COPD  Planned Interventions: Provided patient with basic written and verbal COPD education on self care/management/and exacerbation prevention. The patient is  stable. States he has to be careful with all the weather changes. Does notice changes in his breathing when he ties his shoes. The patient states that he is no longer smoking. Denies any acute changes in his breathing. Is staying in today. Is excited for a basket ball game tonight where he watches Shawnee Knapp in the [employer].  Advised patient to track and manage COPD triggers. Review of triggers and the patient knows how to manage changes.  Provided written and verbal instructions on pursed lip breathing and utilized returned demonstration as teach back Provided instruction about proper use of medications used for management of COPD including inhalers. The patient states after seeing pcp that he received more Symbicort in the mail. He has an 8 month supply. The patient has not filled out the paperwork for Anoro PAP for 2024 because he wanted to talk to pcp about Symbicort supply. The patient has his Anoro paperwork and will fill it out and take it in with him to see the pcp in March. The patient still has a good supply of his Symbicort. Education and support given.  Advised patient to self assesses COPD action plan zone and make appointment with provider if in the yellow zone for 48 hours without improvement Advised patient to engage in light exercise as tolerated 3-5 days a week to aid in the the management of COPD. He continues to exercise and walking is what he does. Does not want to gain a lot of weight Provided education about and advised patient to utilize infection prevention strategies to reduce risk of respiratory infection. Review and education provided. The patient is doing well and staying in especially with weather changes. Discussed the importance of adequate rest and management of fatigue with COPD Screening for signs and symptoms of depression related to chronic disease state  Assessed social determinant of health barriers Vaccinations are up to date  Symptom Management: Take medications as  prescribed   Attend all scheduled provider appointments Call provider office for new concerns or questions  call the Suicide and Crisis Lifeline: 988 call the [place] National Suicide Prevention Lifeline: 220-318-7455 or TTY: 4234030088 TTY 2766628784) to talk to a trained counselor call 1-800-273-TALK (toll free, 24 hour hotline) if experiencing a Mental Health or Behavioral Health Crisis  eliminate smoking in my home identify and remove indoor air pollutants limit outdoor activity during cold weather listen for public air quality announcements every day do breathing exercises every day develop a rescue plan eliminate symptom triggers at home follow rescue plan if symptoms flare-up  Follow Up Plan: Telephone follow up appointment with care management team member scheduled for: 01-29-2023 at 1145 am          Plan:Telephone follow up appointment with care management team member scheduled for:  01-29-2023 at 1145 am   Alto Denver RN, MSN, CCM RN Care Manager  Chronic Care Management Direct Number: [phone]

## 2022-11-27 NOTE — Patient Instructions (Signed)
Please call the care guide team at [phone] if you need to cancel or reschedule your appointment.   If you are experiencing a Mental Health or Behavioral Health Crisis or need someone to talk to, please call the Suicide and Crisis Lifeline: 988 call the [place] National Suicide Prevention Lifeline: 704-805-0890 or TTY: 205-437-2897 TTY 925-314-0496) to talk to a trained counselor call 1-800-273-TALK (toll free, 24 hour hotline)   Following is a copy of the CCM Program Consent:  CCM service includes personalized support from designated clinical staff supervised by the physician, including individualized plan of care and coordination with other care providers 24/7 contact phone numbers for assistance for urgent and routine care needs. Service will only be billed when office clinical staff spend 20 minutes or more in a month to coordinate care. Only one practitioner may furnish and bill the service in a calendar month. The patient may stop CCM services at amy time (effective at the end of the month) by phone call to the office staff. The patient will be responsible for cost sharing (co-pay) or up to 20% of the service fee (after annual deductible is met)  Following is a copy of your full provider care plan:   Goals Addressed             This Visit's Progress    CCM Expected Outcome:  Monitor, Self-Manage and Reduce Symptoms of Heart Failure       Current Barriers:  Chronic Disease Management support and education needs related to effective management of HF  Wt Readings from Last 3 Encounters:  09/28/22 189 lb 1.6 oz (85.8 kg)  04/12/22 189 lb 6.4 oz (85.9 kg)  03/29/22 188 lb 1.6 oz (85.3 kg)    Planned Interventions: Basic overview and discussion of pathophysiology of Heart Failure reviewed. The patient has a good understanding of his heart failure and knows changes to watch for. His weight is stable and he denies any acute changes in his HF or heart health Provided education on  low sodium diet. Review and education provided. States he has gained weight but he is exercising and trying to stay around 185-186 Reviewed Heart Failure Action Plan in depth and provided written copy Assessed need for readable accurate scales in home. Has scales, weighs daily, weight stays around 185 recently. He knows to monitor for swelling and edema Provided education about placing scale on hard, flat surface Advised patient to weigh each morning after emptying bladder Discussed importance of daily weight and advised patient to weigh and record daily Reviewed role of diuretics in prevention of fluid overload and management of heart failure. Review of Lasix and taking his medications. Denies any issues with medication compliance and talked to the pharm D recently. Will continue to monitor Discussed the importance of keeping all appointments with provider. Saw cardiology this year. Will see again in October. Sees the pcp on a regular basis. Education and support given. Next appointment with the pcp on 04-01-2023 at 1 pm  Provided patient with education about the role of exercise in the management of heart failure Advised patient to discuss changes in weight, changes in swelling/edema in feet/legs/abdomen, new questions or concerns about heart failure or heart health with provider Screening for signs and symptoms of depression related to chronic disease state  Assessed social determinant of health barriers  Symptom Management: Take medications as prescribed   Attend all scheduled provider appointments Call provider office for new concerns or questions  call the Suicide and Crisis Lifeline: 988  call the [place] National Suicide Prevention Lifeline: (628) 119-7000 or TTY: (205)431-2828 TTY 530-832-8914) to talk to a trained counselor call 1-800-273-TALK (toll free, 24 hour hotline) if experiencing a Mental Health or Behavioral Health Crisis  call office if I gain more than 2 pounds in one day or 5  pounds in one week track weight in diary use salt in moderation watch for swelling in feet, ankles and legs every day weigh myself daily bring diary to all appointments develop a rescue plan follow rescue plan if symptoms flare-up know when to call the doctorfor changes in heart failure, or acute weight changes track symptoms and what helps feel better or worse dress right for the weather, hot or cold  Follow Up Plan: Telephone follow up appointment with care management team member scheduled for: 01-29-2023 at 1145 am       CCM Expected Outcome:  Monitor, Self-Manage, and Reduce Symptoms of Hypertension       Current Barriers:  Knowledge Deficits related to normalized blood pressures and the need to maintain systolic blood pressures of <150 and diastolic <90 Care Coordination needs related to the proper use of blood pressure cuff in a patient with HTN Chronic Disease Management support and education needs related to effective management of HTN  BP Readings from Last 3 Encounters:  09/28/22 118/80  04/12/22 138/80  03/29/22 132/72    Planned Interventions: Evaluation of current treatment plan related to hypertension self management and patient's adherence to plan as established by provider. The patient states that he is doing well. He did take his blood pressure cuff to the office and they showed him how to take his blood pressure it was reading slightly lower than the office device. Education provided.  Provided education to patient re: stroke prevention, s/s of heart attack and stroke. The patient has had a HA in the past. Is mindful of taking his medications and monitoring for changes in his health and well being. Spoke with pharm D recently; Reviewed prescribed diet heart healthy diet. Review and education provided. Reviewed medications with patient and discussed importance of compliance. The patient denies any issues with orthostatic hypotension since having changes in medications.  The patient knows about side effects to look for. Education and support given. Has upcoming appointments with the pharm D for ongoing support and effective medications management. Discussed plans with patient for ongoing care management follow up and provided patient with direct contact information for care management team; Advised patient, providing education and rationale, to monitor blood pressure daily and record, calling PCP for findings outside established parameters. Education on the goal of systolic blood pressures of <150 and diastolic pressures of <90 Reviewed scheduled/upcoming provider appointments including: 04-01-2023 at 1 pm, knows to call for changes or new needs before next pcp appointment Advised patient to discuss changes in blood pressures, and review of his home monitor in the office compared to the office monitor with provider; Provided education on prescribed diet heart healthy;  Discussed complications of poorly controlled blood pressure such as heart disease, stroke, circulatory complications, vision complications, kidney impairment, sexual dysfunction;  Screening for signs and symptoms of depression related to chronic disease state;  Assessed social determinant of health barriers;   Symptom Management: Take medications as prescribed   Attend all scheduled provider appointments Call provider office for new concerns or questions  call the Suicide and Crisis Lifeline: 988 call the [place] National Suicide Prevention Lifeline: 9308118048 or TTY: 754 334 8563 TTY 6317829415) to talk to a trained counselor call  1-800-273-TALK (toll free, 24 hour hotline) if experiencing a Mental Health or Behavioral Health Crisis  check blood pressure 3 times per week write blood pressure results in a log or diary learn about high blood pressure keep a blood pressure log take blood pressure log to all doctor appointments call doctor for signs and symptoms of high blood pressure develop  an action plan for high blood pressure keep all doctor appointments take medications for blood pressure exactly as prescribed report new symptoms to your doctor  Follow Up Plan: Telephone follow up appointment with care management team member scheduled for: 01-29-2023 at 1145 am       CCM:  Maintain, Monitor and Self-Manage Symptoms of COPD       Current Barriers:  Care Coordination needs related to supply of Symbicort and paperwork that the patient has to get Anoro approved in a patient with COPD Chronic Disease Management support and education needs related to effective management of COPD  Planned Interventions: Provided patient with basic written and verbal COPD education on self care/management/and exacerbation prevention. The patient is stable. States he has to be careful with all the weather changes. Does notice changes in his breathing when he ties his shoes. The patient states that he is no longer smoking. Denies any acute changes in his breathing. Is staying in today. Is excited for a basket ball game tonight where he watches Shawnee Knapp in the [employer].  Advised patient to track and manage COPD triggers. Review of triggers and the patient knows how to manage changes.  Provided written and verbal instructions on pursed lip breathing and utilized returned demonstration as teach back Provided instruction about proper use of medications used for management of COPD including inhalers. The patient states after seeing pcp that he received more Symbicort in the mail. He has an 8 month supply. The patient has not filled out the paperwork for Anoro PAP for 2024 because he wanted to talk to pcp about Symbicort supply. The patient has his Anoro paperwork and will fill it out and take it in with him to see the pcp in March. The patient still has a good supply of his Symbicort. Education and support given.  Advised patient to self assesses COPD action plan zone and make appointment with provider if in the  yellow zone for 48 hours without improvement Advised patient to engage in light exercise as tolerated 3-5 days a week to aid in the the management of COPD. He continues to exercise and walking is what he does. Does not want to gain a lot of weight Provided education about and advised patient to utilize infection prevention strategies to reduce risk of respiratory infection. Review and education provided. The patient is doing well and staying in especially with weather changes. Discussed the importance of adequate rest and management of fatigue with COPD Screening for signs and symptoms of depression related to chronic disease state  Assessed social determinant of health barriers Vaccinations are up to date  Symptom Management: Take medications as prescribed   Attend all scheduled provider appointments Call provider office for new concerns or questions  call the Suicide and Crisis Lifeline: 988 call the [place] National Suicide Prevention Lifeline: 506-325-5283 or TTY: 540 521 7768 TTY (272)406-4937) to talk to a trained counselor call 1-800-273-TALK (toll free, 24 hour hotline) if experiencing a Mental Health or Behavioral Health Crisis  eliminate smoking in my home identify and remove indoor air pollutants limit outdoor activity during cold weather listen for public air quality announcements  every day do breathing exercises every day develop a rescue plan eliminate symptom triggers at home follow rescue plan if symptoms flare-up  Follow Up Plan: Telephone follow up appointment with care management team member scheduled for: 01-29-2023 at 1145 am          Patient verbalizes understanding of instructions and care plan provided today and agrees to view in MyChart. Active MyChart status and patient understanding of how to access instructions and care plan via MyChart confirmed with patient.  Telephone follow up appointment with care management team member scheduled for: 01-29-2023 at 1145  am

## 2022-11-27 NOTE — Telephone Encounter (Signed)
Requested Prescriptions  Pending Prescriptions Disp Refills   sildenafil (REVATIO) 20 MG tablet [Pharmacy Med Name: SILDENAFIL 20 MG TABLET] 10 tablet 2    Sig: Take 1 tablet (20 mg total) by mouth daily as needed (erectile dysfunction).     Urology: Erectile Dysfunction Agents Passed - 11/26/2022  3:12 PM      Passed - AST in normal range and within 360 days    AST  Date Value Ref Range Status  09/28/2022 19 0 - 40 IU/L Final         Passed - ALT in normal range and within 360 days    ALT  Date Value Ref Range Status  09/28/2022 15 0 - 44 IU/L Final         Passed - Last BP in normal range    BP Readings from Last 1 Encounters:  09/28/22 118/80         Passed - Valid encounter within last 12 months    Recent Outpatient Visits           2 months ago Chronic systolic heart failure (HCC)   [place] [facility] [place], Corrie Dandy T, NP   7 months ago Skin tags, multiple acquired   [place] [facility] [place], Corrie Dandy T, NP   8 months ago Chronic active hepatitis (HCC)   [place] [facility] [place], Corrie Dandy T, NP   1 year ago Chronic systolic heart failure (HCC)   [facility] [place], Corrie Dandy T, NP   1 year ago Chronic obstructive pulmonary disease, unspecified COPD type (HCC)   [place] [facility] [place], Dorie Rank, NP       Future Appointments             In 4 months [name], Dorie Rank, NP [facility]

## 2022-12-14 DIAGNOSIS — F17210 Nicotine dependence, cigarettes, uncomplicated: Secondary | ICD-10-CM

## 2022-12-14 DIAGNOSIS — J449 Chronic obstructive pulmonary disease, unspecified: Secondary | ICD-10-CM | POA: Diagnosis not present

## 2022-12-14 DIAGNOSIS — I110 Hypertensive heart disease with heart failure: Secondary | ICD-10-CM

## 2022-12-14 DIAGNOSIS — I5020 Unspecified systolic (congestive) heart failure: Secondary | ICD-10-CM | POA: Diagnosis not present

## 2023-01-15 ENCOUNTER — Ambulatory Visit (INDEPENDENT_AMBULATORY_CARE_PROVIDER_SITE_OTHER): Payer: Medicare HMO

## 2023-01-15 VITALS — Ht 67.0 in | Wt 193.0 lb

## 2023-01-15 DIAGNOSIS — Z0000 Encounter for general adult medical examination without abnormal findings: Secondary | ICD-10-CM | POA: Diagnosis not present

## 2023-01-15 NOTE — Progress Notes (Signed)
Subjective:   Andrew Potts is a 71 y.o. male who presents for Medicare Annual/Subsequent preventive examination.  Visit Complete: Virtual  I connected with  Andrew Potts on 01/15/23 by a audio enabled telemedicine application and verified that I am speaking with the correct person using two identifiers.  Patient Location: Home  Provider Location: Office/Clinic  I discussed the limitations of evaluation and management by telemedicine. The patient expressed understanding and agreed to proceed.   Review of Systems     Cardiac Risk Factors include: advanced age (>17men, >57 women);dyslipidemia;hypertension;male gender;obesity (BMI >30kg/m2)     Objective:    Today's Vitals   01/15/23 0915  Weight: 193 lb (87.5 kg)  Height: 5\' 7"  (1.702 m)   Body mass index is 30.23 kg/m.     01/15/2023    9:06 AM 01/11/2022   10:03 AM 01/09/2021    9:48 AM 12/07/2020    9:13 AM 12/02/2020    4:10 PM 11/04/2020   10:16 AM 10/21/2020    9:08 AM  Advanced Directives  Does Patient Have a Medical Advance Directive? No No No No No No No  Would patient like information on creating a medical advance directive? No - Patient declined No - Patient declined  No - Patient declined   No - Patient declined    Current Medications (verified) Outpatient Encounter Medications as of 01/15/2023  Medication Sig   albuterol (VENTOLIN HFA) 108 (90 Base) MCG/ACT inhaler Inhale 2 puffs into the lungs every 6 (six) hours as needed for wheezing or shortness of breath.   aspirin EC 81 MG EC tablet Take 1 tablet (81 mg total) by mouth daily. Swallow whole.   atorvastatin (LIPITOR) 80 MG tablet Take 1 tablet (80 mg total) by mouth daily.   Ensure (ENSURE) Take 237 mLs by mouth daily.   furosemide (LASIX) 20 MG tablet Take 0.5 tablets (10 mg total) by mouth daily.   lisinopril (ZESTRIL) 10 MG tablet Take 1 tablet (10 mg total) by mouth daily.   metoprolol tartrate (LOPRESSOR) 25 MG tablet Take 0.5 tablets (12.5 mg  total) by mouth 2 (two) times daily.   pantoprazole (PROTONIX) 40 MG tablet Take 1 tablet (40 mg total) by mouth daily.   sildenafil (REVATIO) 20 MG tablet Take 1 tablet (20 mg total) by mouth daily as needed (erectile dysfunction).   umeclidinium-vilanterol (ANORO ELLIPTA) 62.5-25 MCG/ACT AEPB Inhale 1 puff into the lungs daily at 6 (six) AM.   No facility-administered encounter medications on file as of 01/15/2023.    Allergies (verified) Levaquin [levofloxacin], Codeine, and Codone [hydrocodone]   History: Past Medical History:  Diagnosis Date   Anginal pain (HCC)    Aortic atherosclerosis (HCC)    "extensive" per CT imaging on 09/20/2020   Arthritis    Cardiac arrest with successful resuscitation (HCC) 07/28/2020   V.fib arrest in the setting of acute STEMI   CHF (congestive heart failure) (HCC)    COPD (chronic obstructive pulmonary disease) (HCC)    Coronary artery disease    Diverticulosis of colon 09/20/2020   multifocal   Duodenal diverticulum 09/20/2020   Erectile dysfunction    takes PDE5i (sildenafil) medication   GERD (gastroesophageal reflux disease)    HCV (hepatitis C virus)    HLD (hyperlipidemia)    Hypertension    Myocardial infarction (HCC) 07/28/2020   STEMI --> PCI for high grade RCA disease   Pneumonia    Status post insertion of drug eluting coronary artery stent 07/28/2020  DES x 1 to mRCA with overlapping stents to the dRCA   Past Surgical History:  Procedure Laterality Date   ABDOMINAL EXPLORATION SURGERY     CHOLECYSTECTOMY  12/07/2020   COLONOSCOPY     CORONARY/GRAFT ACUTE MI REVASCULARIZATION N/A 07/28/2020   Procedure: CORONARY/GRAFT ACUTE MI REVASCULARIZATION;  Surgeon: Alwyn Pea, MD;  Location: [facility] INVASIVE CV [facility];  Service: Cardiovascular;  Laterality: N/A;   LEFT HEART CATH AND CORONARY ANGIOGRAPHY N/A 07/28/2020   Procedure: LEFT HEART CATH AND CORONARY ANGIOGRAPHY;  Surgeon: Alwyn Pea, MD;  Location: [facility] INVASIVE CV  LAB;  Service: Cardiovascular;  Laterality: N/A;   NASAL SINUS SURGERY     Family History  Problem Relation Age of Onset   Alcohol abuse Father    Alcohol abuse Brother    Aneurysm Brother    Social History   Socioeconomic History   Marital status: Divorced    Spouse name: Not on file   Number of children: Not on file   Years of education: Not on file   Highest education level: Not on file  Occupational History   Occupation: unload trucks    Comment: [employer]  Tobacco Use   Smoking status: Former    Packs/day: 0.50    Years: 47.00    Additional pack years: 0.00    Total pack years: 23.50    Types: Cigarettes    Quit date: 07/28/2020    Years since quitting: 2.4   Smokeless tobacco: Never   Tobacco comments:    08/24/20 given quit smoking paperwork and encouraged continued cessation  Vaping Use   Vaping Use: Never used  Substance and Sexual Activity   Alcohol use: Not Currently   Drug use: Never   Sexual activity: Yes  Other Topics Concern   Not on file  Social History Narrative   Live alone   Social Determinants of Health   Financial Resource Strain: Low Risk  (01/15/2023)   Overall Financial Resource Strain (CARDIA)    Difficulty of Paying Living Expenses: Not hard at all  Food Insecurity: No Food Insecurity (01/15/2023)   Hunger Vital Sign    Worried About Running Out of Food in the Last Year: Never true    Ran Out of Food in the Last Year: Never true  Transportation Needs: No Transportation Needs (01/15/2023)   PRAPARE - Administrator, Civil Service (Medical): No    Lack of Transportation (Non-Medical): No  Physical Activity: Insufficiently Active (01/15/2023)   Exercise Vital Sign    Days of Exercise per Week: 3 days    Minutes of Exercise per Session: 30 min  Stress: No Stress Concern Present (01/15/2023)   [employer] of Occupational Health - Occupational Stress Questionnaire    Feeling of Stress : Not at all  Social Connections: Socially  Isolated (01/15/2023)   Social Connection and Isolation Panel [NHANES]    Frequency of Communication with Friends and Family: Three times a week    Frequency of Social Gatherings with Friends and Family: Never    Attends Religious Services: Never    Database administrator or Organizations: No    Attends Banker Meetings: Never    Marital Status: Divorced    Tobacco Counseling Counseling given: Not Answered Tobacco comments: 08/24/20 given quit smoking paperwork and encouraged continued cessation   Clinical Intake:     Pain : No/denies pain     Nutritional Risks: None Diabetes: No  How often do you need to  have someone help you when you read instructions, pamphlets, or other written materials from your doctor or pharmacy?: 1 - Never  Interpreter Needed?: No  Information entered by :: Kennedy Bucker, LPN   Activities of Daily Living    01/15/2023    9:07 AM 01/11/2023    8:43 AM  In your present state of health, do you have any difficulty performing the following activities:  Hearing? 0 0  Vision? 0 0  Difficulty concentrating or making decisions? 0 0  Walking or climbing stairs? 0 0  Dressing or bathing? 0 0  Doing errands, shopping? 0 0  Preparing Food and eating ? N N  Using the Toilet? N N  In the past six months, have you accidently leaked urine? N N  Do you have problems with loss of bowel control? N N  Managing your Medications? N N  Managing your Finances? N N  Housekeeping or managing your Housekeeping? N N    Patient Care Team: [name], NP as PCP - General (Nurse Practitioner) Marlowe Sax, RN as Case Manager (General Practice)  Indicate any recent Medical Services you may have received from other than [facility] providers in the past year (date may be approximate).     Assessment:   This is a routine wellness examination for Lot.  Hearing/Vision screen Hearing Screening - Comments:: No aids Vision Screening - Comments:: Wears  glasses- [facility]  Dietary issues and exercise activities discussed:     Goals Addressed             This Visit's Progress    DIET - EAT MORE FRUITS AND VEGETABLES         Depression Screen    01/15/2023    9:02 AM 09/28/2022    1:29 PM 07/31/2022    1:56 PM 07/26/2022   11:50 AM 04/12/2022   11:02 AM 03/29/2022   11:10 AM 01/11/2022   10:08 AM  PHQ 2/9 Scores  PHQ - 2 Score 0 0 0 0 0 0 0  PHQ- 9 Score 0 0   0 0     Fall Risk    01/15/2023    9:07 AM 01/11/2023    8:43 AM 09/28/2022    1:28 PM 06/13/2022    2:20 PM 04/12/2022   11:02 AM  Fall Risk   Falls in the past year? 0 0 0 0 0  Number falls in past yr: 0  0 0 0  Injury with Fall? 0  0 0 0  Risk for fall due to : No Fall Risks  No Fall Risks No Fall Risks No Fall Risks  Follow up Falls prevention discussed;Falls evaluation completed  Falls evaluation completed Falls evaluation completed;Education provided Falls evaluation completed    MEDICARE RISK AT HOME:  Medicare Risk at Home - 01/15/23 0908     Any stairs in or around the home? Yes    If so, are there any without handrails? No    Home free of loose throw rugs in walkways, pet beds, electrical cords, etc? Yes    Adequate lighting in your home to reduce risk of falls? Yes    Life alert? No    Use of a cane, walker or w/c? No    Grab bars in the bathroom? No    Shower chair or bench in shower? No    Elevated toilet seat or a handicapped toilet? No             TIMED  UP AND GO:  Was the test performed?  No    Cognitive Function:        01/15/2023    9:09 AM 01/11/2022   10:04 AM 01/09/2021    9:55 AM 03/10/2019   10:02 AM  6CIT Screen  What Year? 0 points 0 points 0 points 0 points  What month? 0 points 0 points 0 points 0 points  What time? 0 points 0 points 0 points 0 points  Count back from 20 0 points 0 points 0 points 0 points  Months in reverse 0 points 0 points 0 points 0 points  Repeat phrase 0 points 0 points 0 points 0 points  Total  Score 0 points 0 points 0 points 0 points    Immunizations Immunization History  Administered Date(s) Administered   Covid-19, Mrna,Vaccine(Spikevax)3yrs and older 06/05/2022   Fluad Quad(high Dose 65+) 04/06/2022   Hepatitis A, Adult 01/01/2022, 01/29/2022, 07/04/2022   Influenza, High Dose Seasonal PF 03/21/2019, 04/07/2020   Influenza,inj,Quad PF,6+ Mos 04/07/2018   Influenza-Unspecified 04/07/2021   PFIZER(Purple Top)SARS-COV-2 Vaccination 10/07/2019, 11/04/2019, 05/01/2020, 11/08/2020   Pfizer Covid-19 Vaccine Bivalent Booster 60yrs & up 06/13/2021   Pneumococcal Conjugate-13 03/21/2019   Pneumococcal Polysaccharide-23 05/10/2021    TDAP status: Due, Education has been provided regarding the importance of this vaccine. Advised may receive this vaccine at local pharmacy or Health Dept. Aware to provide a copy of the vaccination record if obtained from local pharmacy or Health Dept. Verbalized acceptance and understanding.  Flu Vaccine status: Up to date  Pneumococcal vaccine status: Up to date  Covid-19 vaccine status: Completed vaccines  Qualifies for Shingles Vaccine? Yes   Zostavax completed No   Shingrix Completed?: No.    Education has been provided regarding the importance of this vaccine. Patient has been advised to call insurance company to determine out of pocket expense if they have not yet received this vaccine. Advised may also receive vaccine at local pharmacy or Health Dept. Verbalized acceptance and understanding.  Screening Tests Health Maintenance  Topic Date Due   DTaP/Tdap/Td (1 - Tdap) Never done   Zoster Vaccines- Shingrix (1 of 2) Never done   COVID-19 Vaccine (7 - 2023-24 season) 07/31/2022   Lung Cancer Screening  09/28/2023 (Originally 09/20/2021)   Colonoscopy  09/28/2023 (Originally 06/26/1998)   INFLUENZA VACCINE  02/14/2023   Medicare Annual Wellness (AWV)  01/15/2024   Pneumonia Vaccine 64+ Years old  Completed   Hepatitis C Screening   Completed   HPV VACCINES  Aged Out    Health Maintenance  Health Maintenance Due  Topic Date Due   DTaP/Tdap/Td (1 - Tdap) Never done   Zoster Vaccines- Shingrix (1 of 2) Never done   COVID-19 Vaccine (7 - 2023-24 season) 07/31/2022    Declined referral for colonoscopy  Lung Cancer Screening: (Low Dose CT Chest recommended if Age 74-80 years, 20 pack-year currently smoking OR have quit w/in 15years.) does not qualify.    Additional Screening:  Hepatitis C Screening: does qualify; Completed 01/04/22  Vision Screening: Recommended annual ophthalmology exams for early detection of glaucoma and other disorders of the eye. Is the patient up to date with their annual eye exam?  Yes  Who is the provider or what is the name of the office in which the patient attends annual eye exams? [facility] If pt is not established with a provider, would they like to be referred to a provider to establish care? No .   Dental Screening: Recommended annual dental  exams for proper oral hygiene   Community Resource Referral / Chronic Care Management: CRR required this visit?  No   CCM required this visit?  No     Plan:     I have personally reviewed and noted the following in the patient's chart:   Medical and social history Use of alcohol, tobacco or illicit drugs  Current medications and supplements including opioid prescriptions. Patient is not currently taking opioid prescriptions. Functional ability and status Nutritional status Physical activity Advanced directives List of other physicians Hospitalizations, surgeries, and ER visits in previous 12 months Vitals Screenings to include cognitive, depression, and falls Referrals and appointments  In addition, I have reviewed and discussed with patient certain preventive protocols, quality metrics, and best practice recommendations. A written personalized care plan for preventive services as well as general preventive health recommendations  were provided to patient.     Hal Hope, LPN   [id number]   After Visit Summary: (MyChart) Due to this being a telephonic visit, the after visit summary with patients personalized plan was offered to patient via MyChart   Nurse Notes: none

## 2023-01-15 NOTE — Patient Instructions (Signed)
Andrew Potts , Thank you for taking time to come for your Medicare Wellness Visit. I appreciate your ongoing commitment to your health goals. Please review the following plan we discussed and let me know if I can assist you in the future.   These are the goals we discussed:  Goals      "I would like to move"     Care Coordination Interventions: Patient states that he currently lives in a mobile home-new land owners plan to raise the rent $50.00-would like to move to a more affordable space-rent went from $450.00-$500.00. Confirmed that he did receive the housing resources by mail and is currently going through the resources received for available housing-patient remains on waiting list for [employer] housing Solution-Focused Strategies employed: discussed option of remaining in current residence or a new space-patient to continue to go through resources provided to continue to weigh his options. Active listening / Reflection utilized  Emotional Support Provided related to housing needs      CCM Expected Outcome:  Monitor, Self-Manage and Reduce Symptoms of Heart Failure     Current Barriers:  Chronic Disease Management support and education needs related to effective management of HF  Wt Readings from Last 3 Encounters:  09/28/22 189 lb 1.6 oz (85.8 kg)  04/12/22 189 lb 6.4 oz (85.9 kg)  03/29/22 188 lb 1.6 oz (85.3 kg)    Planned Interventions: Basic overview and discussion of pathophysiology of Heart Failure reviewed. The patient has a good understanding of his heart failure and knows changes to watch for. His weight is stable and he denies any acute changes in his HF or heart health Provided education on low sodium diet. Review and education provided. States he has gained weight but he is exercising and trying to stay around 185-186 Reviewed Heart Failure Action Plan in depth and provided written copy Assessed need for readable accurate scales in home. Has scales, weighs daily,  weight stays around 185 recently. He knows to monitor for swelling and edema Provided education about placing scale on hard, flat surface Advised patient to weigh each morning after emptying bladder Discussed importance of daily weight and advised patient to weigh and record daily Reviewed role of diuretics in prevention of fluid overload and management of heart failure. Review of Lasix and taking his medications. Denies any issues with medication compliance and talked to the pharm D recently. Will continue to monitor Discussed the importance of keeping all appointments with provider. Saw cardiology this year. Will see again in October. Sees the pcp on a regular basis. Education and support given. Next appointment with the pcp on 04-01-2023 at 1 pm  Provided patient with education about the role of exercise in the management of heart failure Advised patient to discuss changes in weight, changes in swelling/edema in feet/legs/abdomen, new questions or concerns about heart failure or heart health with provider Screening for signs and symptoms of depression related to chronic disease state  Assessed social determinant of health barriers  Symptom Management: Take medications as prescribed   Attend all scheduled provider appointments Call provider office for new concerns or questions  call the Suicide and Crisis Lifeline: 988 call the [place] National Suicide Prevention Lifeline: 872-498-1936 or TTY: (415)867-1650 TTY 308-327-1313) to talk to a trained counselor call 1-800-273-TALK (toll free, 24 hour hotline) if experiencing a Mental Health or Behavioral Health Crisis  call office if I gain more than 2 pounds in one day or 5 pounds in one week track weight in diary  use salt in moderation watch for swelling in feet, ankles and legs every day weigh myself daily bring diary to all appointments develop a rescue plan follow rescue plan if symptoms flare-up know when to call the doctorfor changes in  heart failure, or acute weight changes track symptoms and what helps feel better or worse dress right for the weather, hot or cold  Follow Up Plan: Telephone follow up appointment with care management team member scheduled for: 01-29-2023 at 1145 am       CCM Expected Outcome:  Monitor, Self-Manage, and Reduce Symptoms of Hypertension     Current Barriers:  Knowledge Deficits related to normalized blood pressures and the need to maintain systolic blood pressures of <150 and diastolic <90 Care Coordination needs related to the proper use of blood pressure cuff in a patient with HTN Chronic Disease Management support and education needs related to effective management of HTN  BP Readings from Last 3 Encounters:  09/28/22 118/80  04/12/22 138/80  03/29/22 132/72    Planned Interventions: Evaluation of current treatment plan related to hypertension self management and patient's adherence to plan as established by provider. The patient states that he is doing well. He did take his blood pressure cuff to the office and they showed him how to take his blood pressure it was reading slightly lower than the office device. Education provided.  Provided education to patient re: stroke prevention, s/s of heart attack and stroke. The patient has had a HA in the past. Is mindful of taking his medications and monitoring for changes in his health and well being. Spoke with pharm D recently; Reviewed prescribed diet heart healthy diet. Review and education provided. Reviewed medications with patient and discussed importance of compliance. The patient denies any issues with orthostatic hypotension since having changes in medications. The patient knows about side effects to look for. Education and support given. Has upcoming appointments with the pharm D for ongoing support and effective medications management. Discussed plans with patient for ongoing care management follow up and provided patient with direct  contact information for care management team; Advised patient, providing education and rationale, to monitor blood pressure daily and record, calling PCP for findings outside established parameters. Education on the goal of systolic blood pressures of <150 and diastolic pressures of <90 Reviewed scheduled/upcoming provider appointments including: 04-01-2023 at 1 pm, knows to call for changes or new needs before next pcp appointment Advised patient to discuss changes in blood pressures, and review of his home monitor in the office compared to the office monitor with provider; Provided education on prescribed diet heart healthy;  Discussed complications of poorly controlled blood pressure such as heart disease, stroke, circulatory complications, vision complications, kidney impairment, sexual dysfunction;  Screening for signs and symptoms of depression related to chronic disease state;  Assessed social determinant of health barriers;   Symptom Management: Take medications as prescribed   Attend all scheduled provider appointments Call provider office for new concerns or questions  call the Suicide and Crisis Lifeline: 988 call the [place] National Suicide Prevention Lifeline: 272-727-0222 or TTY: (657) 275-4112 TTY 701 436 4624) to talk to a trained counselor call 1-800-273-TALK (toll free, 24 hour hotline) if experiencing a Mental Health or Behavioral Health Crisis  check blood pressure 3 times per week write blood pressure results in a log or diary learn about high blood pressure keep a blood pressure log take blood pressure log to all doctor appointments call doctor for signs and symptoms of high blood pressure  develop an action plan for high blood pressure keep all doctor appointments take medications for blood pressure exactly as prescribed report new symptoms to your doctor  Follow Up Plan: Telephone follow up appointment with care management team member scheduled for: 01-29-2023 at 1145  am       CCM:  Maintain, Monitor and Self-Manage Symptoms of COPD     Current Barriers:  Care Coordination needs related to supply of Symbicort and paperwork that the patient has to get Anoro approved in a patient with COPD Chronic Disease Management support and education needs related to effective management of COPD  Planned Interventions: Provided patient with basic written and verbal COPD education on self care/management/and exacerbation prevention. The patient is stable. States he has to be careful with all the weather changes. Does notice changes in his breathing when he ties his shoes. The patient states that he is no longer smoking. Denies any acute changes in his breathing. Is staying in today. Is excited for a basket ball game tonight where he watches Shawnee Knapp in the [employer].  Advised patient to track and manage COPD triggers. Review of triggers and the patient knows how to manage changes.  Provided written and verbal instructions on pursed lip breathing and utilized returned demonstration as teach back Provided instruction about proper use of medications used for management of COPD including inhalers. The patient states after seeing pcp that he received more Symbicort in the mail. He has an 8 month supply. The patient has not filled out the paperwork for Anoro PAP for 2024 because he wanted to talk to pcp about Symbicort supply. The patient has his Anoro paperwork and will fill it out and take it in with him to see the pcp in March. The patient still has a good supply of his Symbicort. Education and support given.  Advised patient to self assesses COPD action plan zone and make appointment with provider if in the yellow zone for 48 hours without improvement Advised patient to engage in light exercise as tolerated 3-5 days a week to aid in the the management of COPD. He continues to exercise and walking is what he does. Does not want to gain a lot of weight Provided education about and  advised patient to utilize infection prevention strategies to reduce risk of respiratory infection. Review and education provided. The patient is doing well and staying in especially with weather changes. Discussed the importance of adequate rest and management of fatigue with COPD Screening for signs and symptoms of depression related to chronic disease state  Assessed social determinant of health barriers Vaccinations are up to date  Symptom Management: Take medications as prescribed   Attend all scheduled provider appointments Call provider office for new concerns or questions  call the Suicide and Crisis Lifeline: 988 call the [place] National Suicide Prevention Lifeline: 3181647944 or TTY: 470-016-3177 TTY 732-474-1600) to talk to a trained counselor call 1-800-273-TALK (toll free, 24 hour hotline) if experiencing a Mental Health or Behavioral Health Crisis  eliminate smoking in my home identify and remove indoor air pollutants limit outdoor activity during cold weather listen for public air quality announcements every day do breathing exercises every day develop a rescue plan eliminate symptom triggers at home follow rescue plan if symptoms flare-up  Follow Up Plan: Telephone follow up appointment with care management team member scheduled for: 01-29-2023 at 1145 am       DIET - EAT MORE FRUITS AND VEGETABLES     Patient Stated  01/09/2021, wants to lose belly fat     Weight (lb) < 200 lb (90.7 kg)        This is a list of the screening recommended for you and due dates:  Health Maintenance  Topic Date Due   DTaP/Tdap/Td vaccine (1 - Tdap) Never done   Zoster (Shingles) Vaccine (1 of 2) Never done   COVID-19 Vaccine (7 - 2023-24 season) 07/31/2022   Screening for Lung Cancer  09/28/2023*   Colon Cancer Screening  09/28/2023*   Flu Shot  02/14/2023   Medicare Annual Wellness Visit  01/15/2024   Pneumonia Vaccine  Completed   Hepatitis C Screening  Completed   HPV  Vaccine  Aged Out  *Topic was postponed. The date shown is not the original due date.    Advanced directives: no  Conditions/risks identified: none  Next appointment: Follow up in one year for your annual wellness visit. 01/20/24 @ 3:30 pm by phone  Preventive Care 65 Years and Older, Male  Preventive care refers to lifestyle choices and visits with your health care provider that can promote health and wellness. What does preventive care include? A yearly physical exam. This is also called an annual well check. Dental exams once or twice a year. Routine eye exams. Ask your health care provider how often you should have your eyes checked. Personal lifestyle choices, including: Daily care of your teeth and gums. Regular physical activity. Eating a healthy diet. Avoiding tobacco and drug use. Limiting alcohol use. Practicing safe sex. Taking low doses of aspirin every day. Taking vitamin and mineral supplements as recommended by your health care provider. What happens during an annual well check? The services and screenings done by your health care provider during your annual well check will depend on your age, overall health, lifestyle risk factors, and family history of disease. Counseling  Your health care provider may ask you questions about your: Alcohol use. Tobacco use. Drug use. Emotional well-being. Home and relationship well-being. Sexual activity. Eating habits. History of falls. Memory and ability to understand (cognition). Work and work Astronomer. Screening  You may have the following tests or measurements: Height, weight, and BMI. Blood pressure. Lipid and cholesterol levels. These may be checked every 5 years, or more frequently if you are over 90 years old. Skin check. Lung cancer screening. You may have this screening every year starting at age 79 if you have a 30-pack-year history of smoking and currently smoke or have quit within the past 15 years. Fecal  occult blood test (FOBT) of the stool. You may have this test every year starting at age 48. Flexible sigmoidoscopy or colonoscopy. You may have a sigmoidoscopy every 5 years or a colonoscopy every 10 years starting at age 82. Prostate cancer screening. Recommendations will vary depending on your family history and other risks. Hepatitis C blood test. Hepatitis B blood test. Sexually transmitted disease (STD) testing. Diabetes screening. This is done by checking your blood sugar (glucose) after you have not eaten for a while (fasting). You may have this done every 1-3 years. Abdominal aortic aneurysm (AAA) screening. You may need this if you are a current or former smoker. Osteoporosis. You may be screened starting at age 64 if you are at high risk. Talk with your health care provider about your test results, treatment options, and if necessary, the need for more tests. Vaccines  Your health care provider may recommend certain vaccines, such as: Influenza vaccine. This is recommended every year. Tetanus, diphtheria,  and acellular pertussis (Tdap, Td) vaccine. You may need a Td booster every 10 years. Zoster vaccine. You may need this after age 44. Pneumococcal 13-valent conjugate (PCV13) vaccine. One dose is recommended after age 19. Pneumococcal polysaccharide (PPSV23) vaccine. One dose is recommended after age 15. Talk to your health care provider about which screenings and vaccines you need and how often you need them. This information is not intended to replace advice given to you by your health care provider. Make sure you discuss any questions you have with your health care provider. Document Released: 07/29/2015 Document Revised: 03/21/2016 Document Reviewed: 05/03/2015 Elsevier Interactive Patient Education  2017 [employer].  Fall Prevention in the Home Falls can cause injuries. They can happen to people of all ages. There are many things you can do to make your home safe and to  help prevent falls. What can I do on the outside of my home? Regularly fix the edges of walkways and driveways and fix any cracks. Remove anything that might make you trip as you walk through a door, such as a raised step or threshold. Trim any bushes or trees on the path to your home. Use bright outdoor lighting. Clear any walking paths of anything that might make someone trip, such as rocks or tools. Regularly check to see if handrails are loose or broken. Make sure that both sides of any steps have handrails. Any raised decks and porches should have guardrails on the edges. Have any leaves, snow, or ice cleared regularly. Use sand or salt on walking paths during winter. Clean up any spills in your garage right away. This includes oil or grease spills. What can I do in the bathroom? Use night lights. Install grab bars by the toilet and in the tub and shower. Do not use towel bars as grab bars. Use non-skid mats or decals in the tub or shower. If you need to sit down in the shower, use a plastic, non-slip stool. Keep the floor dry. Clean up any water that spills on the floor as soon as it happens. Remove soap buildup in the tub or shower regularly. Attach bath mats securely with double-sided non-slip rug tape. Do not have throw rugs and other things on the floor that can make you trip. What can I do in the bedroom? Use night lights. Make sure that you have a light by your bed that is easy to reach. Do not use any sheets or blankets that are too big for your bed. They should not hang down onto the floor. Have a firm chair that has side arms. You can use this for support while you get dressed. Do not have throw rugs and other things on the floor that can make you trip. What can I do in the kitchen? Clean up any spills right away. Avoid walking on wet floors. Keep items that you use a lot in easy-to-reach places. If you need to reach something above you, use a strong step stool that has  a grab bar. Keep electrical cords out of the way. Do not use floor polish or wax that makes floors slippery. If you must use wax, use non-skid floor wax. Do not have throw rugs and other things on the floor that can make you trip. What can I do with my stairs? Do not leave any items on the stairs. Make sure that there are handrails on both sides of the stairs and use them. Fix handrails that are broken or loose. Make sure  that handrails are as long as the stairways. Check any carpeting to make sure that it is firmly attached to the stairs. Fix any carpet that is loose or worn. Avoid having throw rugs at the top or bottom of the stairs. If you do have throw rugs, attach them to the floor with carpet tape. Make sure that you have a light switch at the top of the stairs and the bottom of the stairs. If you do not have them, ask someone to add them for you. What else can I do to help prevent falls? Wear shoes that: Do not have high heels. Have rubber bottoms. Are comfortable and fit you well. Are closed at the toe. Do not wear sandals. If you use a stepladder: Make sure that it is fully opened. Do not climb a closed stepladder. Make sure that both sides of the stepladder are locked into place. Ask someone to hold it for you, if possible. Clearly mark and make sure that you can see: Any grab bars or handrails. First and last steps. Where the edge of each step is. Use tools that help you move around (mobility aids) if they are needed. These include: Canes. Walkers. Scooters. Crutches. Turn on the lights when you go into a dark area. Replace any light bulbs as soon as they burn out. Set up your furniture so you have a clear path. Avoid moving your furniture around. If any of your floors are uneven, fix them. If there are any pets around you, be aware of where they are. Review your medicines with your doctor. Some medicines can make you feel dizzy. This can increase your chance of  falling. Ask your doctor what other things that you can do to help prevent falls. This information is not intended to replace advice given to you by your health care provider. Make sure you discuss any questions you have with your health care provider. Document Released: 04/28/2009 Document Revised: 12/08/2015 Document Reviewed: 08/06/2014 Elsevier Interactive Patient Education  2017 [employer].

## 2023-01-29 ENCOUNTER — Telehealth: Payer: Medicare HMO

## 2023-01-29 ENCOUNTER — Ambulatory Visit (INDEPENDENT_AMBULATORY_CARE_PROVIDER_SITE_OTHER): Payer: Medicare HMO

## 2023-01-29 DIAGNOSIS — I10 Essential (primary) hypertension: Secondary | ICD-10-CM

## 2023-01-29 DIAGNOSIS — J449 Chronic obstructive pulmonary disease, unspecified: Secondary | ICD-10-CM

## 2023-01-29 DIAGNOSIS — I5022 Chronic systolic (congestive) heart failure: Secondary | ICD-10-CM

## 2023-01-29 NOTE — Patient Instructions (Signed)
Please call the care guide team at [phone] if you need to cancel or reschedule your appointment.   If you are experiencing a Mental Health or Behavioral Health Crisis or need someone to talk to, please call the Suicide and Crisis Lifeline: 988 call the [place] National Suicide Prevention Lifeline: (480)505-5037 or TTY: 727-386-0189 TTY 860-037-6714) to talk to a trained counselor call 1-800-273-TALK (toll free, 24 hour hotline)   Following is a copy of the CCM Program Consent:  CCM service includes personalized support from designated clinical staff supervised by the physician, including individualized plan of care and coordination with other care providers 24/7 contact phone numbers for assistance for urgent and routine care needs. Service will only be billed when office clinical staff spend 20 minutes or more in a month to coordinate care. Only one practitioner may furnish and bill the service in a calendar month. The patient may stop CCM services at amy time (effective at the end of the month) by phone call to the office staff. The patient will be responsible for cost sharing (co-pay) or up to 20% of the service fee (after annual deductible is met)  Following is a copy of your full provider care plan:   Goals Addressed             This Visit's Progress    CCM Expected Outcome:  Monitor, Self-Manage, and Reduce Symptoms of Hypertension       Current Barriers:  Knowledge Deficits related to normalized blood pressures and the need to maintain systolic blood pressures of <150 and diastolic <90 Care Coordination needs related to the proper use of blood pressure cuff in a patient with HTN Chronic Disease Management support and education needs related to effective management of HTN  BP Readings from Last 3 Encounters:  09/28/22 118/80  04/12/22 138/80  03/29/22 132/72    Planned Interventions: Evaluation of current treatment plan related to hypertension self management and patient's  adherence to plan as established by provider. The patient states that he is doing well. He did take his blood pressure cuff to the office and they showed him how to take his blood pressure it was reading slightly lower than the office device. Education provided.  Provided education to patient re: stroke prevention, s/s of heart attack and stroke. The patient has had a HA in the past. Is mindful of taking his medications and monitoring for changes in his health and well being.  Reviewed prescribed diet heart healthy diet. Review and education provided. Reviewed medications with patient and discussed importance of compliance. The patient denies any issues with orthostatic hypotension since having changes in medications. The patient knows about side effects to look for. Education and support given. Review of pharm D support and ongoing medication needs. The patient verbalized understanding.  Discussed plans with patient for ongoing care management follow up and provided patient with direct contact information for care management team; Advised patient, providing education and rationale, to monitor blood pressure daily and record, calling PCP for findings outside established parameters. Education on the goal of systolic blood pressures of <150 and diastolic pressures of <90 Reviewed scheduled/upcoming provider appointments including: 04-01-2023 at 1 pm, knows to call for changes or new needs before next pcp appointment Advised patient to discuss changes in blood pressures, and review of his home monitor in the office compared to the office monitor with provider; Provided education on prescribed diet heart healthy;  Discussed complications of poorly controlled blood pressure such as heart disease, stroke, circulatory  complications, vision complications, kidney impairment, sexual dysfunction;  Screening for signs and symptoms of depression related to chronic disease state;  Assessed social determinant of health  barriers; The patient is doing well. The patient is having some swelling but not much. Is taking his Lasix as directed. Education and support given.   Symptom Management: Take medications as prescribed   Attend all scheduled provider appointments Call provider office for new concerns or questions  call the Suicide and Crisis Lifeline: 988 call the [place] National Suicide Prevention Lifeline: 430 699 5976 or TTY: (228) 299-6107 TTY (320)514-0397) to talk to a trained counselor call 1-800-273-TALK (toll free, 24 hour hotline) if experiencing a Mental Health or Behavioral Health Crisis  check blood pressure 3 times per week write blood pressure results in a log or diary learn about high blood pressure keep a blood pressure log take blood pressure log to all doctor appointments call doctor for signs and symptoms of high blood pressure develop an action plan for high blood pressure keep all doctor appointments take medications for blood pressure exactly as prescribed report new symptoms to your doctor  Follow Up Plan: Telephone follow up appointment with care management team member scheduled for: 04-09-2023 at 1145 am       CCM:  Maintain, Monitor and Self-Manage Symptoms of COPD       Current Barriers:  Care Coordination needs related to supply of Symbicort and paperwork that the patient has to get Anoro approved in a patient with COPD Chronic Disease Management support and education needs related to effective management of COPD  Planned Interventions: Provided patient with basic written and verbal COPD education on self care/management/and exacerbation prevention. The patient is stable. States he has to be careful with all the weather changes. Does notice changes in his breathing when he ties his shoes. The patient states that he is no longer smoking. Denies any acute changes in his breathing.The patient is pacing his activity and monitoring for changes in his breathing.  Advised patient to  track and manage COPD triggers. Review of triggers and the patient knows how to manage changes.  Provided written and verbal instructions on pursed lip breathing and utilized returned demonstration as teach back Provided instruction about proper use of medications used for management of COPD including inhalers. The patient states after seeing pcp that he received more Symbicort in the mail. He has an 8 month supply. The patient has not filled out the paperwork for Anoro PAP for 2024 because he wanted to talk to pcp about Symbicort supply. The patient has his Anoro paperwork and will fill it out and take it in with him to see the pcp in March. The patient still has a good supply of his Symbicort. Education and support given.  Advised patient to self assesses COPD action plan zone and make appointment with provider if in the yellow zone for 48 hours without improvement Advised patient to engage in light exercise as tolerated 3-5 days a week to aid in the the management of COPD. He continues to exercise and walking is what he does. Does not want to gain a lot of weight Provided education about and advised patient to utilize infection prevention strategies to reduce risk of respiratory infection. Review and education provided. The patient is doing well and staying in especially with weather changes. Discussed the importance of adequate rest and management of fatigue with COPD Screening for signs and symptoms of depression related to chronic disease state  Assessed social determinant of health barriers  Vaccinations are up to date  Symptom Management: Take medications as prescribed   Attend all scheduled provider appointments Call provider office for new concerns or questions  call the Suicide and Crisis Lifeline: 988 call the [place] National Suicide Prevention Lifeline: (403) 640-0776 or TTY: (724)337-8914 TTY 559-366-0212) to talk to a trained counselor call 1-800-273-TALK (toll free, 24 hour hotline) if  experiencing a Mental Health or Behavioral Health Crisis  eliminate smoking in my home identify and remove indoor air pollutants limit outdoor activity during cold weather listen for public air quality announcements every day do breathing exercises every day develop a rescue plan eliminate symptom triggers at home follow rescue plan if symptoms flare-up  Follow Up Plan: Telephone follow up appointment with care management team member scheduled for: 04-09-2023 at 1145 am          Patient verbalizes understanding of instructions and care plan provided today and agrees to view in MyChart. Active MyChart status and patient understanding of how to access instructions and care plan via MyChart confirmed with patient.  Telephone follow up appointment with care management team member scheduled for: 04-09-2023 at 1145 am

## 2023-01-29 NOTE — Chronic Care Management (AMB) (Signed)
Chronic Care Management   CCM RN Visit Note  01/29/2023 Name: Andrew Potts MRN: [id number] DOB: [birth year]  Subjective: Andrew Potts is a 72 y.o. year old male who is a primary care patient of [name], Dorie Rank, NP. The patient was referred to the Chronic Care Management team for assistance with care management needs subsequent to provider initiation of CCM services and plan of care.    Today's Visit:  Engaged with patient by telephone for follow up visit.     SDOH Interventions Today    Flowsheet Row Most Recent Value  SDOH Interventions   Health Literacy Interventions Intervention Not Indicated         Goals Addressed             This Visit's Progress    CCM Expected Outcome:  Monitor, Self-Manage, and Reduce Symptoms of Hypertension       Current Barriers:  Knowledge Deficits related to normalized blood pressures and the need to maintain systolic blood pressures of <150 and diastolic <90 Care Coordination needs related to the proper use of blood pressure cuff in a patient with HTN Chronic Disease Management support and education needs related to effective management of HTN  BP Readings from Last 3 Encounters:  09/28/22 118/80  04/12/22 138/80  03/29/22 132/72    Planned Interventions: Evaluation of current treatment plan related to hypertension self management and patient's adherence to plan as established by provider. The patient states that he is doing well. He did take his blood pressure cuff to the office and they showed him how to take his blood pressure it was reading slightly lower than the office device. Education provided.  Provided education to patient re: stroke prevention, s/s of heart attack and stroke. The patient has had a HA in the past. Is mindful of taking his medications and monitoring for changes in his health and well being.  Reviewed prescribed diet heart healthy diet. Review and education provided. Reviewed medications with patient and  discussed importance of compliance. The patient denies any issues with orthostatic hypotension since having changes in medications. The patient knows about side effects to look for. Education and support given. Review of pharm D support and ongoing medication needs. The patient verbalized understanding.  Discussed plans with patient for ongoing care management follow up and provided patient with direct contact information for care management team; Advised patient, providing education and rationale, to monitor blood pressure daily and record, calling PCP for findings outside established parameters. Education on the goal of systolic blood pressures of <150 and diastolic pressures of <90 Reviewed scheduled/upcoming provider appointments including: 04-01-2023 at 1 pm, knows to call for changes or new needs before next pcp appointment Advised patient to discuss changes in blood pressures, and review of his home monitor in the office compared to the office monitor with provider; Provided education on prescribed diet heart healthy;  Discussed complications of poorly controlled blood pressure such as heart disease, stroke, circulatory complications, vision complications, kidney impairment, sexual dysfunction;  Screening for signs and symptoms of depression related to chronic disease state;  Assessed social determinant of health barriers; The patient is doing well. The patient is having some swelling but not much. Is taking his Lasix as directed. Education and support given.   Symptom Management: Take medications as prescribed   Attend all scheduled provider appointments Call provider office for new concerns or questions  call the Suicide and Crisis Lifeline: 988 call the [place] National Suicide Prevention Lifeline: 9371455698 or  TTY: 1-610-960-4 TTY 989-593-7270) to talk to a trained counselor call 1-800-273-TALK (toll free, 24 hour hotline) if experiencing a Mental Health or Behavioral Health Crisis   check blood pressure 3 times per week write blood pressure results in a log or diary learn about high blood pressure keep a blood pressure log take blood pressure log to all doctor appointments call doctor for signs and symptoms of high blood pressure develop an action plan for high blood pressure keep all doctor appointments take medications for blood pressure exactly as prescribed report new symptoms to your doctor  Follow Up Plan: Telephone follow up appointment with care management team member scheduled for: 04-09-2023 at 1145 am       CCM:  Maintain, Monitor and Self-Manage Symptoms of COPD       Current Barriers:  Care Coordination needs related to supply of Symbicort and paperwork that the patient has to get Anoro approved in a patient with COPD Chronic Disease Management support and education needs related to effective management of COPD  Planned Interventions: Provided patient with basic written and verbal COPD education on self care/management/and exacerbation prevention. The patient is stable. States he has to be careful with all the weather changes. Does notice changes in his breathing when he ties his shoes. The patient states that he is no longer smoking. Denies any acute changes in his breathing.The patient is pacing his activity and monitoring for changes in his breathing.  Advised patient to track and manage COPD triggers. Review of triggers and the patient knows how to manage changes.  Provided written and verbal instructions on pursed lip breathing and utilized returned demonstration as teach back Provided instruction about proper use of medications used for management of COPD including inhalers. The patient states after seeing pcp that he received more Symbicort in the mail. He has an 8 month supply. The patient has not filled out the paperwork for Anoro PAP for 2024 because he wanted to talk to pcp about Symbicort supply. The patient has his Anoro paperwork and will fill  it out and take it in with him to see the pcp in March. The patient still has a good supply of his Symbicort. Education and support given.  Advised patient to self assesses COPD action plan zone and make appointment with provider if in the yellow zone for 48 hours without improvement Advised patient to engage in light exercise as tolerated 3-5 days a week to aid in the the management of COPD. He continues to exercise and walking is what he does. Does not want to gain a lot of weight Provided education about and advised patient to utilize infection prevention strategies to reduce risk of respiratory infection. Review and education provided. The patient is doing well and staying in especially with weather changes. Discussed the importance of adequate rest and management of fatigue with COPD Screening for signs and symptoms of depression related to chronic disease state  Assessed social determinant of health barriers Vaccinations are up to date  Symptom Management: Take medications as prescribed   Attend all scheduled provider appointments Call provider office for new concerns or questions  call the Suicide and Crisis Lifeline: 988 call the [place] National Suicide Prevention Lifeline: 8066499448 or TTY: (406) 830-3362 TTY 908-034-1462) to talk to a trained counselor call 1-800-273-TALK (toll free, 24 hour hotline) if experiencing a Mental Health or Behavioral Health Crisis  eliminate smoking in my home identify and remove indoor air pollutants limit outdoor activity during cold weather listen for public  air quality announcements every day do breathing exercises every day develop a rescue plan eliminate symptom triggers at home follow rescue plan if symptoms flare-up  Follow Up Plan: Telephone follow up appointment with care management team member scheduled for: 04-09-2023 at 1145 am          Plan:Telephone follow up appointment with care management team member scheduled for:  04-09-2023  at 1145 am  Alto Denver RN, MSN, CCM RN Care Manager  Chronic Care Management Direct Number: [phone]

## 2023-01-30 ENCOUNTER — Other Ambulatory Visit: Payer: Self-pay | Admitting: Nurse Practitioner

## 2023-01-30 NOTE — Telephone Encounter (Signed)
Requested Prescriptions  Pending Prescriptions Disp Refills   furosemide (LASIX) 20 MG tablet [Pharmacy Med Name: FUROSEMIDE 20 MG TABLET] 45 tablet 0    Sig: Take 0.5 tablets (10 mg total) by mouth daily.     Cardiovascular:  Diuretics - Loop Failed - 01/30/2023 10:19 AM      Failed - Mg Level in normal range and within 180 days    Magnesium  Date Value Ref Range Status  03/29/2022 2.2 1.6 - 2.3 mg/dL Final         Passed - K in normal range and within 180 days    Potassium  Date Value Ref Range Status  09/28/2022 3.9 3.5 - 5.2 mmol/L Final         Passed - Ca in normal range and within 180 days    Calcium  Date Value Ref Range Status  09/28/2022 9.5 8.6 - 10.2 mg/dL Final         Passed - Na in normal range and within 180 days    Sodium  Date Value Ref Range Status  09/28/2022 140 134 - 144 mmol/L Final         Passed - Cr in normal range and within 180 days    Creatinine, Ser  Date Value Ref Range Status  09/28/2022 0.84 0.76 - 1.27 mg/dL Final         Passed - Cl in normal range and within 180 days    Chloride  Date Value Ref Range Status  09/28/2022 101 96 - 106 mmol/L Final         Passed - Last BP in normal range    BP Readings from Last 1 Encounters:  09/28/22 118/80         Passed - Valid encounter within last 6 months    Recent Outpatient Visits           4 months ago Chronic systolic heart failure (HCC)   [place] [facility] [place], Corrie Dandy T, NP   9 months ago Skin tags, multiple acquired   [place] [facility] [place], Corrie Dandy T, NP   10 months ago Chronic active hepatitis (HCC)   [place] [facility] [place], Corrie Dandy T, NP   1 year ago Chronic systolic heart failure (HCC)   [place] [facility] [place], Corrie Dandy T, NP   1 year ago Chronic obstructive pulmonary disease, unspecified COPD type (HCC)   [place] [facility] [place], Dorie Rank, NP       Future  Appointments             In 2 months [name], Dorie Rank, NP [place] [facility], [facility]

## 2023-02-13 DIAGNOSIS — J449 Chronic obstructive pulmonary disease, unspecified: Secondary | ICD-10-CM

## 2023-02-13 DIAGNOSIS — I10 Essential (primary) hypertension: Secondary | ICD-10-CM | POA: Diagnosis not present

## 2023-03-06 ENCOUNTER — Other Ambulatory Visit: Payer: Self-pay | Admitting: Nurse Practitioner

## 2023-03-07 NOTE — Telephone Encounter (Signed)
Requested Prescriptions  Pending Prescriptions Disp Refills   sildenafil (REVATIO) 20 MG tablet [Pharmacy Med Name: SILDENAFIL 20 MG TABLET] 10 tablet 0    Sig: Take 1 tablet (20 mg total) by mouth daily as needed (erectile dysfunction).     Urology: Erectile Dysfunction Agents Passed - 03/06/2023 12:42 PM      Passed - AST in normal range and within 360 days    AST  Date Value Ref Range Status  09/28/2022 19 0 - 40 IU/L Final         Passed - ALT in normal range and within 360 days    ALT  Date Value Ref Range Status  09/28/2022 15 0 - 44 IU/L Final         Passed - Last BP in normal range    BP Readings from Last 1 Encounters:  09/28/22 118/80         Passed - Valid encounter within last 12 months    Recent Outpatient Visits           5 months ago Chronic systolic heart failure (HCC)   [place] [facility] [place], [place] T, NP   10 months ago Skin tags, multiple acquired   [place] [facility] [place], Corrie Dandy T, NP   11 months ago Chronic active hepatitis (HCC)   [place] [facility] [place], Corrie Dandy T, NP   1 year ago Chronic systolic heart failure (HCC)   [place] [facility] [place], Corrie Dandy T, NP   1 year ago Chronic obstructive pulmonary disease, unspecified COPD type (HCC)   [place] [facility] [place], Dorie Rank, NP       Future Appointments             In 3 weeks [name], Dorie Rank, NP [place] [facility], [facility]

## 2023-04-01 ENCOUNTER — Encounter: Payer: Medicare HMO | Admitting: Nurse Practitioner

## 2023-04-09 ENCOUNTER — Telehealth: Payer: Medicare HMO

## 2023-04-09 ENCOUNTER — Other Ambulatory Visit: Payer: Medicare HMO

## 2023-04-09 ENCOUNTER — Other Ambulatory Visit: Payer: Self-pay

## 2023-04-09 NOTE — Patient Instructions (Signed)
Visit Information  Thank you for taking time to visit with me today. Please don't hesitate to contact me if I can be of assistance to you before our next scheduled telephone appointment.  Following are the goals we discussed today:   Goals Addressed             This Visit's Progress    RNCM Care Management  Expected Outcome:  Monitor, Self-Manage, and Reduce Symptoms of Hypertension       Current Barriers:  Knowledge Deficits related to normalized blood pressures and the need to maintain systolic blood pressures of <150 and diastolic <90 Care Coordination needs related to the proper use of blood pressure cuff in a patient with HTN Chronic Disease Management support and education needs related to effective management of HTN  BP Readings from Last 3 Encounters:  09/28/22 118/80  04/12/22 138/80  03/29/22 132/72    Planned Interventions: Evaluation of current treatment plan related to hypertension self management and patient's adherence to plan as established by provider. The patient states that he is doing well. He did take his blood pressure cuff to the office and they showed him how to take his blood pressure it was reading slightly lower than the office device. Education provided. Denies any changes in his blood pressures. Sees the pcp next week. He is doing well. He recently moved and he is happy in his new place.  Provided education to patient re: stroke prevention, s/s of heart attack and stroke. The patient has had a HA in the past. Is mindful of taking his medications and monitoring for changes in his health and well being.  Reviewed prescribed diet heart healthy diet. Review and education provided. Reviewed medications with patient and discussed importance of compliance. The patient denies any issues with orthostatic hypotension since having changes in medications. The patient knows about side effects to look for. Education and support given. Review of pharm D support and ongoing  medication needs. The patient verbalized understanding.  Discussed plans with patient for ongoing care management follow up and provided patient with direct contact information for care management team; Advised patient, providing education and rationale, to monitor blood pressure daily and record, calling PCP for findings outside established parameters. Education on the goal of systolic blood pressures of <150 and diastolic pressures of <90 Reviewed scheduled/upcoming provider appointments including: 04-16-2023 at 1 pm, knows to call for changes or new needs before next pcp appointment Advised patient to discuss changes in blood pressures, and review of his home monitor in the office compared to the office monitor with provider; Provided education on prescribed diet heart healthy;  Discussed complications of poorly controlled blood pressure such as heart disease, stroke, circulatory complications, vision complications, kidney impairment, sexual dysfunction;  Screening for signs and symptoms of depression related to chronic disease state;  Assessed social determinant of health barriers; The patient is doing well. The patient is having some swelling but not much. Is taking his Lasix as directed. Education and support given.   Symptom Management: Take medications as prescribed   Attend all scheduled provider appointments Call provider office for new concerns or questions  call the Suicide and Crisis Lifeline: 988 call the [place] National Suicide Prevention Lifeline: 820-888-8546 or TTY: (680)604-2818 TTY (709)076-7104) to talk to a trained counselor call 1-800-273-TALK (toll free, 24 hour hotline) if experiencing a Mental Health or Behavioral Health Crisis  check blood pressure 3 times per week write blood pressure results in a log or diary learn about  high blood pressure keep a blood pressure log take blood pressure log to all doctor appointments call doctor for signs and symptoms of high blood  pressure develop an action plan for high blood pressure keep all doctor appointments take medications for blood pressure exactly as prescribed report new symptoms to your doctor  Follow Up Plan: Telephone follow up appointment with care management team member scheduled for: 05-21-2023 at 1145 am       RNCM Care Management Expected Outcome:  Monitor, Self-Manage and Reduce Symptoms of Heart Failure       Current Barriers:  Chronic Disease Management support and education needs related to effective management of HF  Wt Readings from Last 3 Encounters:  01/15/23 193 lb (87.5 kg)  09/28/22 189 lb 1.6 oz (85.8 kg)  04/12/22 189 lb 6.4 oz (85.9 kg)    Planned Interventions: Basic overview and discussion of pathophysiology of Heart Failure reviewed. The patient has a good understanding of his heart failure and knows changes to watch for. His weight is stable and he denies any acute changes in his HF or heart health. Self reported weight is 181. Will see the pcp next week and have his weight checked. Has some swelling but states that when he is sitting he elevates his feet and legs. Education and support provided.  Provided education on low sodium diet. Review and education provided. States he has gained weight but he is exercising and trying to stay around 181 to 188 Reviewed Heart Failure Action Plan in depth and provided written copy Assessed need for readable accurate scales in home. Has scales, weighs daily, weight stays around 185 recently. He knows to monitor for swelling and edema Provided education about placing scale on hard, flat surface Advised patient to weigh each morning after emptying bladder Discussed importance of daily weight and advised patient to weigh and record daily Reviewed role of diuretics in prevention of fluid overload and management of heart failure. Review of Lasix and taking his medications. Denies any issues with medication compliance and talked to the pharm D  recently. Will continue to monitor Discussed the importance of keeping all appointments with provider. Saw cardiology this year. Will see again in October. Sees the pcp on a regular basis. Education and support given. Next appointment with the pcp on 04-16-2023 at 1 pm  Provided patient with education about the role of exercise in the management of heart failure Advised patient to discuss changes in weight, changes in swelling/edema in feet/legs/abdomen, new questions or concerns about heart failure or heart health with provider Screening for signs and symptoms of depression related to chronic disease state  Assessed social determinant of health barriers  Symptom Management: Take medications as prescribed   Attend all scheduled provider appointments Call provider office for new concerns or questions  call the Suicide and Crisis Lifeline: 988 call the [place] National Suicide Prevention Lifeline: 5065603225 or TTY: (740)401-7153 TTY 330 833 7132) to talk to a trained counselor call 1-800-273-TALK (toll free, 24 hour hotline) if experiencing a Mental Health or Behavioral Health Crisis  call office if I gain more than 2 pounds in one day or 5 pounds in one week track weight in diary use salt in moderation watch for swelling in feet, ankles and legs every day weigh myself daily bring diary to all appointments develop a rescue plan follow rescue plan if symptoms flare-up know when to call the doctorfor changes in heart failure, or acute weight changes track symptoms and what helps feel better or  worse dress right for the weather, hot or cold  Follow Up Plan: Telephone follow up appointment with care management team member scheduled for: 05-21-2023 at 1145 am       RNCM Care Management:  Maintain, Monitor and Self-Manage Symptoms of COPD       Current Barriers:  Care Coordination needs related to supply of Symbicort and paperwork that the patient has to get Anoro approved in a patient with  COPD Chronic Disease Management support and education needs related to effective management of COPD  Planned Interventions: Provided patient with basic written and verbal COPD education on self care/management/and exacerbation prevention. The patient is stable. States he has to be careful with all the weather changes. Does notice changes in his breathing when he ties his shoes. The patient states that he is no longer smoking. Denies any acute changes in his breathing.The patient is pacing his activity and monitoring for changes in his breathing. Recently moved over the last couple of weeks. Is happy to be in his new apartment and feels this is going to work so much better for him. He got notification on 03-29-2023 and had 2 weeks to move. He is saving 200.00 a month and is happy about this as well. He states he gave a lot of stuff to good will and took a lot of stuff to the dump. He says he wore himself out but he is settle now and happy to be in his new apartment. Review of pacing activity and making sure he does not overdo things.  Advised patient to track and manage COPD triggers. Review of triggers and the patient knows how to manage changes.  Provided written and verbal instructions on pursed lip breathing and utilized returned demonstration as teach back Provided instruction about proper use of medications used for management of COPD including inhalers. The patient states after seeing pcp that he received more Symbicort in the mail. He has an 8 month supply. The patient has not filled out the paperwork for Anoro PAP for 2024 because he wanted to talk to pcp about Symbicort supply. The patient has his Anoro paperwork and will fill it out and take it in with him to see the pcp in March. The patient still has a good supply of his Symbicort. Education and support given.  Advised patient to self assesses COPD action plan zone and make appointment with provider if in the yellow zone for 48 hours without  improvement Advised patient to engage in light exercise as tolerated 3-5 days a week to aid in the the management of COPD. He continues to exercise and walking is what he does. Does not want to gain a lot of weight Provided education about and advised patient to utilize infection prevention strategies to reduce risk of respiratory infection. Review and education provided. The patient is doing well and staying in especially with weather changes. Discussed the importance of adequate rest and management of fatigue with COPD Screening for signs and symptoms of depression related to chronic disease state  Assessed social determinant of health barriers Vaccinations are up to date. He took the flu vaccine last week. Denies any issues with vaccination.   Symptom Management: Take medications as prescribed   Attend all scheduled provider appointments Call provider office for new concerns or questions  call the Suicide and Crisis Lifeline: 988 call the [place] National Suicide Prevention Lifeline: 410-054-0902 or TTY: 971-717-8482 TTY (402)011-5467) to talk to a trained counselor call 1-800-273-TALK (toll free, 24 hour hotline)  if experiencing a Mental Health or Behavioral Health Crisis  eliminate smoking in my home identify and remove indoor air pollutants limit outdoor activity during cold weather listen for public air quality announcements every day do breathing exercises every day develop a rescue plan eliminate symptom triggers at home follow rescue plan if symptoms flare-up  Follow Up Plan: Telephone follow up appointment with care management team member scheduled for: 05-21-2023 at 1145 am           Our next appointment is by telephone on 05-21-2023 at 1145 am  Please call the care guide team at [phone] if you need to cancel or reschedule your appointment.   If you are experiencing a Mental Health or Behavioral Health Crisis or need someone to talk to, please call the Suicide and  Crisis Lifeline: 988 call the [place] National Suicide Prevention Lifeline: 269-498-0677 or TTY: 510-664-3619 TTY 309-879-6455) to talk to a trained counselor call 1-800-273-TALK (toll free, 24 hour hotline)   Patient verbalizes understanding of instructions and care plan provided today and agrees to view in MyChart. Active MyChart status and patient understanding of how to access instructions and care plan via MyChart confirmed with patient.     Telephone follow up appointment with care management team member scheduled for: 05-21-2023 at 1145 am  Alto Denver RN, MSN, CCM RN Care Manager  [facility]  Ambulatory Care Management  Direct Number: [phone]

## 2023-04-09 NOTE — Patient Outreach (Signed)
Care Management   Visit Note  04/09/2023 Name: Andrew Potts MRN: [id number] DOB: 05/16/1951  Subjective: Andrew Potts is a 72 y.o. year old male who is a primary care patient of [name], Dorie Rank, NP. The Care Management team was consulted for assistance.      Engaged with patient spoke with patient by telephone.    Goals Addressed             This Visit's Progress    RNCM Care Management  Expected Outcome:  Monitor, Self-Manage, and Reduce Symptoms of Hypertension       Current Barriers:  Knowledge Deficits related to normalized blood pressures and the need to maintain systolic blood pressures of <150 and diastolic <90 Care Coordination needs related to the proper use of blood pressure cuff in a patient with HTN Chronic Disease Management support and education needs related to effective management of HTN  BP Readings from Last 3 Encounters:  09/28/22 118/80  04/12/22 138/80  03/29/22 132/72    Planned Interventions: Evaluation of current treatment plan related to hypertension self management and patient's adherence to plan as established by provider. The patient states that he is doing well. He did take his blood pressure cuff to the office and they showed him how to take his blood pressure it was reading slightly lower than the office device. Education provided. Denies any changes in his blood pressures. Sees the pcp next week. He is doing well. He recently moved and he is happy in his new place.  Provided education to patient re: stroke prevention, s/s of heart attack and stroke. The patient has had a HA in the past. Is mindful of taking his medications and monitoring for changes in his health and well being.  Reviewed prescribed diet heart healthy diet. Review and education provided. Reviewed medications with patient and discussed importance of compliance. The patient denies any issues with orthostatic hypotension since having changes in medications. The patient knows  about side effects to look for. Education and support given. Review of pharm D support and ongoing medication needs. The patient verbalized understanding.  Discussed plans with patient for ongoing care management follow up and provided patient with direct contact information for care management team; Advised patient, providing education and rationale, to monitor blood pressure daily and record, calling PCP for findings outside established parameters. Education on the goal of systolic blood pressures of <150 and diastolic pressures of <90 Reviewed scheduled/upcoming provider appointments including: 04-16-2023 at 1 pm, knows to call for changes or new needs before next pcp appointment Advised patient to discuss changes in blood pressures, and review of his home monitor in the office compared to the office monitor with provider; Provided education on prescribed diet heart healthy;  Discussed complications of poorly controlled blood pressure such as heart disease, stroke, circulatory complications, vision complications, kidney impairment, sexual dysfunction;  Screening for signs and symptoms of depression related to chronic disease state;  Assessed social determinant of health barriers; The patient is doing well. The patient is having some swelling but not much. Is taking his Lasix as directed. Education and support given.   Symptom Management: Take medications as prescribed   Attend all scheduled provider appointments Call provider office for new concerns or questions  call the Suicide and Crisis Lifeline: 988 call the [place] National Suicide Prevention Lifeline: (601)671-4645 or TTY: 9062773153 TTY 319-774-3995) to talk to a trained counselor call 1-800-273-TALK (toll free, 24 hour hotline) if experiencing a Mental Health or Behavioral Health  Crisis  check blood pressure 3 times per week write blood pressure results in a log or diary learn about high blood pressure keep a blood pressure log take  blood pressure log to all doctor appointments call doctor for signs and symptoms of high blood pressure develop an action plan for high blood pressure keep all doctor appointments take medications for blood pressure exactly as prescribed report new symptoms to your doctor  Follow Up Plan: Telephone follow up appointment with care management team member scheduled for: 05-21-2023 at 1145 am       RNCM Care Management Expected Outcome:  Monitor, Self-Manage and Reduce Symptoms of Heart Failure       Current Barriers:  Chronic Disease Management support and education needs related to effective management of HF  Wt Readings from Last 3 Encounters:  01/15/23 193 lb (87.5 kg)  09/28/22 189 lb 1.6 oz (85.8 kg)  04/12/22 189 lb 6.4 oz (85.9 kg)    Planned Interventions: Basic overview and discussion of pathophysiology of Heart Failure reviewed. The patient has a good understanding of his heart failure and knows changes to watch for. His weight is stable and he denies any acute changes in his HF or heart health. Self reported weight is 181. Will see the pcp next week and have his weight checked. Has some swelling but states that when he is sitting he elevates his feet and legs. Education and support provided.  Provided education on low sodium diet. Review and education provided. States he has gained weight but he is exercising and trying to stay around 181 to 188 Reviewed Heart Failure Action Plan in depth and provided written copy Assessed need for readable accurate scales in home. Has scales, weighs daily, weight stays around 185 recently. He knows to monitor for swelling and edema Provided education about placing scale on hard, flat surface Advised patient to weigh each morning after emptying bladder Discussed importance of daily weight and advised patient to weigh and record daily Reviewed role of diuretics in prevention of fluid overload and management of heart failure. Review of Lasix and  taking his medications. Denies any issues with medication compliance and talked to the pharm D recently. Will continue to monitor Discussed the importance of keeping all appointments with provider. Saw cardiology this year. Will see again in October. Sees the pcp on a regular basis. Education and support given. Next appointment with the pcp on 04-16-2023 at 1 pm  Provided patient with education about the role of exercise in the management of heart failure Advised patient to discuss changes in weight, changes in swelling/edema in feet/legs/abdomen, new questions or concerns about heart failure or heart health with provider Screening for signs and symptoms of depression related to chronic disease state  Assessed social determinant of health barriers  Symptom Management: Take medications as prescribed   Attend all scheduled provider appointments Call provider office for new concerns or questions  call the Suicide and Crisis Lifeline: 988 call the [place] National Suicide Prevention Lifeline: 813-382-6303 or TTY: 725-768-2116 TTY (416)306-7317) to talk to a trained counselor call 1-800-273-TALK (toll free, 24 hour hotline) if experiencing a Mental Health or Behavioral Health Crisis  call office if I gain more than 2 pounds in one day or 5 pounds in one week track weight in diary use salt in moderation watch for swelling in feet, ankles and legs every day weigh myself daily bring diary to all appointments develop a rescue plan follow rescue plan if symptoms flare-up know when  to call the doctorfor changes in heart failure, or acute weight changes track symptoms and what helps feel better or worse dress right for the weather, hot or cold  Follow Up Plan: Telephone follow up appointment with care management team member scheduled for: 05-21-2023 at 1145 am       RNCM Care Management:  Maintain, Monitor and Self-Manage Symptoms of COPD       Current Barriers:  Care Coordination needs related to  supply of Symbicort and paperwork that the patient has to get Anoro approved in a patient with COPD Chronic Disease Management support and education needs related to effective management of COPD  Planned Interventions: Provided patient with basic written and verbal COPD education on self care/management/and exacerbation prevention. The patient is stable. States he has to be careful with all the weather changes. Does notice changes in his breathing when he ties his shoes. The patient states that he is no longer smoking. Denies any acute changes in his breathing.The patient is pacing his activity and monitoring for changes in his breathing. Recently moved over the last couple of weeks. Is happy to be in his new apartment and feels this is going to work so much better for him. He got notification on 03-29-2023 and had 2 weeks to move. He is saving 200.00 a month and is happy about this as well. He states he gave a lot of stuff to good will and took a lot of stuff to the dump. He says he wore himself out but he is settle now and happy to be in his new apartment. Review of pacing activity and making sure he does not overdo things.  Advised patient to track and manage COPD triggers. Review of triggers and the patient knows how to manage changes.  Provided written and verbal instructions on pursed lip breathing and utilized returned demonstration as teach back Provided instruction about proper use of medications used for management of COPD including inhalers. The patient states after seeing pcp that he received more Symbicort in the mail. He has an 8 month supply. The patient has not filled out the paperwork for Anoro PAP for 2024 because he wanted to talk to pcp about Symbicort supply. The patient has his Anoro paperwork and will fill it out and take it in with him to see the pcp in March. The patient still has a good supply of his Symbicort. Education and support given.  Advised patient to self assesses COPD  action plan zone and make appointment with provider if in the yellow zone for 48 hours without improvement Advised patient to engage in light exercise as tolerated 3-5 days a week to aid in the the management of COPD. He continues to exercise and walking is what he does. Does not want to gain a lot of weight Provided education about and advised patient to utilize infection prevention strategies to reduce risk of respiratory infection. Review and education provided. The patient is doing well and staying in especially with weather changes. Discussed the importance of adequate rest and management of fatigue with COPD Screening for signs and symptoms of depression related to chronic disease state  Assessed social determinant of health barriers Vaccinations are up to date. He took the flu vaccine last week. Denies any issues with vaccination.   Symptom Management: Take medications as prescribed   Attend all scheduled provider appointments Call provider office for new concerns or questions  call the Suicide and Crisis Lifeline: 988 call the [place] National Suicide Prevention  Lifeline: 403-375-4640 or TTY: 2294851634 TTY 671-093-4736) to talk to a trained counselor call 1-800-273-TALK (toll free, 24 hour hotline) if experiencing a Mental Health or Behavioral Health Crisis  eliminate smoking in my home identify and remove indoor air pollutants limit outdoor activity during cold weather listen for public air quality announcements every day do breathing exercises every day develop a rescue plan eliminate symptom triggers at home follow rescue plan if symptoms flare-up  Follow Up Plan: Telephone follow up appointment with care management team member scheduled for: 05-21-2023 at 1145 am           Consent to Services:  Patient was given information about care management services, agreed to services, and gave verbal consent to participate.   Plan: Telephone follow up appointment with care  management team member scheduled for:  05-21-2023 at 1145 am  Alto Denver RN, MSN, CCM RN Care Manager  [facility]  Ambulatory Care Management  Direct Number: [phone]

## 2023-04-14 NOTE — Patient Instructions (Signed)
Be Involved in Caring For Your Health:  Taking Medications When medications are taken as directed, they can greatly improve your health. But if they are not taken as prescribed, they may not work. In some cases, not taking them correctly can be harmful. To help ensure your treatment remains effective and safe, understand your medications and how to take them. Bring your medications to each visit for review by your provider.  Your lab results, notes, and after visit summary will be available on My Chart. We strongly encourage you to use this feature. If lab results are abnormal the clinic will contact you with the appropriate steps. If the clinic does not contact you assume the results are satisfactory. You can always view your results on My Chart. If you have questions regarding your health or results, please contact the clinic during office hours. You can also ask questions on My Chart.  We at [facility] are grateful that you chose us to provide your care. We strive to provide evidence-based and compassionate care and are always looking for feedback. If you get a survey from the clinic please complete this so we can hear your opinions.  DASH Eating Plan DASH stands for Dietary Approaches to Stop Hypertension. The DASH eating plan is a healthy eating plan that has been shown to: Lower high blood pressure (hypertension). Reduce your risk for type 2 diabetes, heart disease, and stroke. Help with weight loss. What are tips for following this plan? Reading food labels Check food labels for the amount of salt (sodium) per serving. Choose foods with less than 5 percent of the Daily Value (DV) of sodium. In general, foods with less than 300 milligrams (mg) of sodium per serving fit into this eating plan. To find whole grains, look for the word "whole" as the first word in the ingredient list. Shopping Buy products labeled as "low-sodium" or "no salt added." Buy fresh foods. Avoid canned  foods and pre-made or frozen meals. Cooking Try not to add salt when you cook. Use salt-free seasonings or herbs instead of table salt or sea salt. Check with your health care provider or pharmacist before using salt substitutes. Do not fry foods. Cook foods in healthy ways, such as baking, boiling, grilling, roasting, or broiling. Cook using oils that are good for your heart. These include olive, canola, avocado, soybean, and sunflower oil. Meal planning  Eat a balanced diet. This should include: 4 or more servings of fruits and 4 or more servings of vegetables each day. Try to fill half of your plate with fruits and vegetables. 6-8 servings of whole grains each day. 6 or less servings of lean meat, poultry, or fish each day. 1 oz is 1 serving. A 3 oz (85 g) serving of meat is about the same size as the palm of your hand. One egg is 1 oz (28 g). 2-3 servings of low-fat dairy each day. One serving is 1 cup (237 mL). 1 serving of nuts, seeds, or beans 5 times each week. 2-3 servings of heart-healthy fats. Healthy fats called omega-3 fatty acids are found in foods such as walnuts, flaxseeds, fortified milks, and eggs. These fats are also found in cold-water fish, such as sardines, salmon, and mackerel. Limit how much you eat of: Canned or prepackaged foods. Food that is high in trans fat, such as fried foods. Food that is high in saturated fat, such as fatty meat. Desserts and other sweets, sugary drinks, and other foods with added sugar. Full-fat   dairy products. Do not salt foods before eating. Do not eat more than 4 egg yolks a week. Try to eat at least 2 vegetarian meals a week. Eat more home-cooked food and less restaurant, buffet, and fast food. Lifestyle When eating at a restaurant, ask if your food can be made with less salt or no salt. If you drink alcohol: Limit how much you have to: 0-1 drink a day if you are male. 0-2 drinks a day if you are male. Know how much alcohol is in  your drink. In the [place] one drink is one 12 oz bottle of beer (355 mL), one 5 oz glass of wine (148 mL), or one 1 oz glass of hard liquor (44 mL). General information Avoid eating more than 2,300 mg of salt a day. If you have hypertension, you may need to reduce your sodium intake to 1,500 mg a day. Work with your provider to stay at a healthy body weight or lose weight. Ask what the best weight range is for you. On most days of the week, get at least 30 minutes of exercise that causes your heart to beat faster. This may include walking, swimming, or biking. Work with your provider or dietitian to adjust your eating plan to meet your specific calorie needs. What foods should I eat? Fruits All fresh, dried, or frozen fruit. Canned fruits that are in their natural juice and do not have sugar added to them. Vegetables Fresh or frozen vegetables that are raw, steamed, roasted, or grilled. Low-sodium or reduced-sodium tomato and vegetable juice. Low-sodium or reduced-sodium tomato sauce and tomato paste. Low-sodium or reduced-sodium canned vegetables. Grains Whole-grain or whole-wheat bread. Whole-grain or whole-wheat pasta. Brown rice. Oatmeal. Quinoa. Bulgur. Whole-grain and low-sodium cereals. Pita bread. Low-fat, low-sodium crackers. Whole-wheat flour tortillas. Meats and other proteins Skinless chicken or turkey. Ground chicken or turkey. Pork with fat trimmed off. Fish and seafood. Egg whites. Dried beans, peas, or lentils. Unsalted nuts, nut butters, and seeds. Unsalted canned beans. Lean cuts of beef with fat trimmed off. Low-sodium, lean precooked or cured meat, such as sausages or meat loaves. Dairy Low-fat (1%) or fat-free (skim) milk. Reduced-fat, low-fat, or fat-free cheeses. Nonfat, low-sodium ricotta or cottage cheese. Low-fat or nonfat yogurt. Low-fat, low-sodium cheese. Fats and oils Soft margarine without trans fats. Vegetable oil. Reduced-fat, low-fat, or light mayonnaise and salad  dressings (reduced-sodium). Canola, safflower, olive, avocado, soybean, and sunflower oils. Avocado. Seasonings and condiments Herbs. Spices. Seasoning mixes without salt. Other foods Unsalted popcorn and pretzels. Fat-free sweets. The items listed above may not be all the foods and drinks you can have. Talk to a dietitian to learn more. What foods should I avoid? Fruits Canned fruit in a light or heavy syrup. Fried fruit. Fruit in cream or butter sauce. Vegetables Creamed or fried vegetables. Vegetables in a cheese sauce. Regular canned vegetables that are not marked as low-sodium or reduced-sodium. Regular canned tomato sauce and paste that are not marked as low-sodium or reduced-sodium. Regular tomato and vegetable juices that are not marked as low-sodium or reduced-sodium. Pickles. Olives. Grains Baked goods made with fat, such as croissants, muffins, or some breads. Dry pasta or rice meal packs. Meats and other proteins Fatty cuts of meat. Ribs. Fried meat. Bacon. Bologna, salami, and other precooked or cured meats, such as sausages or meat loaves, that are not lean and low in sodium. Fat from the back of a pig (fatback). Bratwurst. Salted nuts and seeds. Canned beans with added salt. Canned   or smoked fish. Whole eggs or egg yolks. Chicken or turkey with skin. Dairy Whole or 2% milk, cream, and half-and-half. Whole or full-fat cream cheese. Whole-fat or sweetened yogurt. Full-fat cheese. Nondairy creamers. Whipped toppings. Processed cheese and cheese spreads. Fats and oils Butter. Stick margarine. Lard. Shortening. Ghee. Bacon fat. Tropical oils, such as coconut, palm kernel, or palm oil. Seasonings and condiments Onion salt, garlic salt, seasoned salt, table salt, and sea salt. Worcestershire sauce. Tartar sauce. Barbecue sauce. Teriyaki sauce. Soy sauce, including reduced-sodium soy sauce. Steak sauce. Canned and packaged gravies. Fish sauce. Oyster sauce. Cocktail sauce. Store-bought  horseradish. Ketchup. Mustard. Meat flavorings and tenderizers. Bouillon cubes. Hot sauces. Pre-made or packaged marinades. Pre-made or packaged taco seasonings. Relishes. Regular salad dressings. Other foods Salted popcorn and pretzels. The items listed above may not be all the foods and drinks you should avoid. Talk to a dietitian to learn more. Where to find more information National Heart, Lung, and Blood Institute (NHLBI): nhlbi.nih.gov American Heart Association (AHA): heart.org Academy of Nutrition and Dietetics: eatright.org National Kidney Foundation (NKF): kidney.org This information is not intended to replace advice given to you by your health care provider. Make sure you discuss any questions you have with your health care provider. Document Revised: 07/19/2022 Document Reviewed: 07/19/2022 Elsevier Patient Education  2024 Elsevier Inc.  

## 2023-04-16 ENCOUNTER — Ambulatory Visit (INDEPENDENT_AMBULATORY_CARE_PROVIDER_SITE_OTHER): Payer: Medicare HMO | Admitting: Nurse Practitioner

## 2023-04-16 ENCOUNTER — Encounter: Payer: Self-pay | Admitting: Nurse Practitioner

## 2023-04-16 VITALS — BP 129/76 | HR 67 | Temp 98.0°F | Resp 18 | Ht 68.0 in | Wt 186.2 lb

## 2023-04-16 DIAGNOSIS — J410 Simple chronic bronchitis: Secondary | ICD-10-CM | POA: Diagnosis not present

## 2023-04-16 DIAGNOSIS — E782 Mixed hyperlipidemia: Secondary | ICD-10-CM | POA: Diagnosis not present

## 2023-04-16 DIAGNOSIS — K732 Chronic active hepatitis, not elsewhere classified: Secondary | ICD-10-CM

## 2023-04-16 DIAGNOSIS — Z0000 Encounter for general adult medical examination without abnormal findings: Secondary | ICD-10-CM

## 2023-04-16 DIAGNOSIS — I252 Old myocardial infarction: Secondary | ICD-10-CM | POA: Diagnosis not present

## 2023-04-16 DIAGNOSIS — I700 Atherosclerosis of aorta: Secondary | ICD-10-CM | POA: Diagnosis not present

## 2023-04-16 DIAGNOSIS — I5022 Chronic systolic (congestive) heart failure: Secondary | ICD-10-CM | POA: Diagnosis not present

## 2023-04-16 DIAGNOSIS — K219 Gastro-esophageal reflux disease without esophagitis: Secondary | ICD-10-CM

## 2023-04-16 DIAGNOSIS — I2510 Atherosclerotic heart disease of native coronary artery without angina pectoris: Secondary | ICD-10-CM

## 2023-04-16 DIAGNOSIS — I10 Essential (primary) hypertension: Secondary | ICD-10-CM | POA: Diagnosis not present

## 2023-04-16 DIAGNOSIS — D692 Other nonthrombocytopenic purpura: Secondary | ICD-10-CM

## 2023-04-16 DIAGNOSIS — N400 Enlarged prostate without lower urinary tract symptoms: Secondary | ICD-10-CM

## 2023-04-16 DIAGNOSIS — I2089 Other forms of angina pectoris: Secondary | ICD-10-CM | POA: Diagnosis not present

## 2023-04-16 LAB — MICROALBUMIN, URINE WAIVED
Creatinine, Urine Waived: 50 mg/dL (ref 10–300)
Microalb, Ur Waived: 10 mg/L (ref 0–19)
Microalb/Creat Ratio: <30 mg/g (ref <30)

## 2023-04-16 MED ORDER — ATORVASTATIN CALCIUM 80 MG PO TABS: 80.0000 mg | ORAL_TABLET | Freq: Every day | ORAL | 4 refills | Status: DC

## 2023-04-16 MED ORDER — UMECLIDINIUM-VILANTEROL 62.5-25 MCG/ACT IN AEPB: 1.0000 | INHALATION_SPRAY | Freq: Every day | RESPIRATORY_TRACT | 4 refills | Status: DC

## 2023-04-16 MED ORDER — LISINOPRIL 10 MG PO TABS: 10.0000 mg | ORAL_TABLET | Freq: Every day | ORAL | 4 refills | Status: DC

## 2023-04-16 MED ORDER — SILDENAFIL CITRATE 20 MG PO TABS: 20.0000 mg | ORAL_TABLET | Freq: Every day | ORAL | 0 refills | Status: DC | PRN

## 2023-04-16 MED ORDER — FUROSEMIDE 20 MG PO TABS: 10.0000 mg | ORAL_TABLET | Freq: Every day | ORAL | 4 refills | Status: DC

## 2023-04-16 MED ORDER — NITROGLYCERIN 0.4 MG SL SUBL: 0.4000 mg | SUBLINGUAL_TABLET | SUBLINGUAL | 3 refills | Status: AC | PRN

## 2023-04-16 MED ORDER — PANTOPRAZOLE SODIUM 40 MG PO TBEC: 40.0000 mg | DELAYED_RELEASE_TABLET | Freq: Every day | ORAL | 4 refills | Status: DC

## 2023-04-16 MED ORDER — METOPROLOL TARTRATE 25 MG PO TABS: 12.5000 mg | ORAL_TABLET | Freq: Two times a day (BID) | ORAL | 4 refills | Status: DC

## 2023-04-16 NOTE — Assessment & Plan Note (Signed)
Ongoing with STEMI 07/28/20, at this time will continue collaboration with cardiology as needed and medication regimen as prescribed.  No recent CP.

## 2023-04-16 NOTE — Assessment & Plan Note (Signed)
Chronic, stable.  No recent CP.  Continue current medication regimen and collaboration with cardiology.

## 2023-04-16 NOTE — Assessment & Plan Note (Signed)
Since STEMI 07/28/20.  Euvolemic.  At this time continue current medication regimen and collaboration with cardiology.  Recommend: - Reminded to call for an overnight weight gain of >2 pounds or a weekly weight gain of >5 pounds - not adding salt to food and read food labels. Reviewed the importance of keeping daily sodium intake to 2000mg  daily.  Highly recommend he cut back on sodium intake as suspect this is causing some edema. - Avoid Ibuprofen products.

## 2023-04-16 NOTE — Assessment & Plan Note (Signed)
Chronic, ongoing.  Continue current medication regimen and adjust as needed. Lipid panel today. 

## 2023-04-16 NOTE — Assessment & Plan Note (Signed)
Chronic, ongoing, completed treatment.  Continue collaboration with GI, provided him number to call and schedule with them for recheck.

## 2023-04-16 NOTE — Assessment & Plan Note (Signed)
Chronic, ongoing.  Spirometry FEV1/FVC 90% and FEV1 76% in August 2020 - will repeat next visit.   Recommend continued cessation smoking.  Continue current medication regimen, Anoro offering him benefit with minimal rescue use.  He refuses Lung CA Screening CT at this time due to current medical bills, recommended he obtain this.  Return in 6 months with repeat spirometry.

## 2023-04-16 NOTE — Assessment & Plan Note (Signed)
Chronic.  As evidenced by bruising upper extremities with ASA use.  Recommend gentle skin care and monitor for wounds, if present immediately notify provider.

## 2023-04-16 NOTE — Progress Notes (Signed)
BP 129/76 (BP Location: Left Arm, Patient Position: Sitting, Cuff Size: Normal)   Pulse 67   Temp 98 F (36.7 C) (Oral)   Resp 18   Ht 5\' 8"  (1.727 m)   Wt 186 lb 3.2 oz (84.5 kg)   SpO2 96%   BMI 28.31 kg/m    Subjective:    Patient ID: Andrew Potts, male    DOB: 04-Nov-1950, 72 y.o.   MRN: [id number]  HPI: Andrew Potts is a 72 y.o. male presenting on 04/16/2023 for comprehensive medical examination. Current medical complaints include:none  He currently lives with: self Interim Problems from his last visit: no  HYPERTENSION/HYPERLIPIDEMIA & HF Taking Lisinopril 10 MG, Lasix 10 MG daily, Metoprolol 12.5 MG BID,and Atorvastatin 80 MG.  HF clinic on 02/07/21.  Echo on 01/24/21 noted EF 55 to 60% with no LVH. History of STEMI 07/28/20.  Goes to visit with cardiology next month, last visit was 05/23/22.  Denies any recent CP. Satisfied with current treatment? yes Duration of hypertension: chronic BP monitoring frequency: not checking BP range:  BP medication side effects: no Duration of hyperlipidemia: chronic Cholesterol medication side effects: no Cholesterol supplements: none Medication compliance: good compliance Aspirin: yes Recent stressors: no Recurrent headaches: no Visual changes: no Palpitations: no Dyspnea: no Chest pain: no Lower extremity edema: a little at sock line on occasion Dizzy/lightheaded: no   COPD Using Anoro which he reports offers benefit and helps get mucus out.  Albuterol as needed. Has continued to quit smoking since his STEMI.  COPD status: stable Satisfied with current treatment?: yes Oxygen use: no Dyspnea frequency: no Cough frequency: no Rescue inhaler frequency: very seldom Limitation of activity: no Productive cough: none Last Spirometry: FEV1 76% & FEV1/FVC 90% in August 2020 Pneumovax: Up to Date Influenza: Up to Date    HEPATITIS C Last saw GI on 01/29/22 and was started on Epclusa,never went for retesting after treatment  and does not want to at this time. He completed entire treatment option. Did have blood transfusion in 1973.     Continues on Protonix daily for GERD. Duration since diagnosis: 2020 Hep C transmission: possibly via blood transfusion in 1973 Genotype: 1a Viral load:  340,000 international units/mL. Hepatology evaluation:yes Liver biopsy:no  Cirrhosis: no Antiviral therapy:not yet Hepatocellular carcinoma screening: no Esophageal varices screening/EGD: no Hepatitis A Vaccine: Not up to Date Hepatitis B Vaccine: unknown Pneumovax Vaccine: Up to Date   Functional Status Survey: Is the patient deaf or have difficulty hearing?: No Does the patient have difficulty seeing, even when wearing glasses/contacts?: No Does the patient have difficulty concentrating, remembering, or making decisions?: No Does the patient have difficulty walking or climbing stairs?: No Does the patient have difficulty dressing or bathing?: No Does the patient have difficulty doing errands alone such as visiting a doctor's office or shopping?: No  FALL RISK:    04/16/2023    1:23 PM 01/29/2023   12:11 PM 01/15/2023    9:07 AM 01/11/2023    8:43 AM 09/28/2022    1:28 PM  Fall Risk   Falls in the past year? 0 0 0 0 0  Number falls in past yr: 0 0 0  0  Injury with Fall? 0 0 0  0  Risk for fall due to : No Fall Risks No Fall Risks No Fall Risks  No Fall Risks  Follow up Falls evaluation completed Falls evaluation completed;Education provided Falls prevention discussed;Falls evaluation completed  Falls evaluation completed  Depression Screen    04/16/2023    1:22 PM 01/15/2023    9:02 AM 09/28/2022    1:29 PM 07/31/2022    1:56 PM 07/26/2022   11:50 AM  Depression screen PHQ 2/9  Decreased Interest 0 0 0 0 0  Down, Depressed, Hopeless 0 0 0 0 0  PHQ - 2 Score 0 0 0 0 0  Altered sleeping 0 0 0    Tired, decreased energy 0 0 0    Change in appetite 0 0 0    Feeling bad or failure about yourself  0 0 0    Trouble  concentrating 0 0 0    Moving slowly or fidgety/restless 0 0 0    Suicidal thoughts 0 0 0    PHQ-9 Score 0 0 0    Difficult doing work/chores Not difficult at all Not difficult at all Not difficult at all      Advanced Directives <no information>  Past Medical History:  Past Medical History:  Diagnosis Date   Anginal pain (HCC)    Aortic atherosclerosis (HCC)    "extensive" per CT imaging on 09/20/2020   Arthritis    Cardiac arrest with successful resuscitation (HCC) 07/28/2020   V.fib arrest in the setting of acute STEMI   CHF (congestive heart failure) (HCC)    COPD (chronic obstructive pulmonary disease) (HCC)    Coronary artery disease    Diverticulosis of colon 09/20/2020   multifocal   Duodenal diverticulum 09/20/2020   Erectile dysfunction    takes PDE5i (sildenafil) medication   GERD (gastroesophageal reflux disease)    HCV (hepatitis C virus)    HLD (hyperlipidemia)    Hypertension    Myocardial infarction (HCC) 07/28/2020   STEMI --> PCI for high grade RCA disease   Pneumonia    Status post insertion of drug eluting coronary artery stent 07/28/2020   DES x 1 to mRCA with overlapping stents to the dRCA    Surgical History:  Past Surgical History:  Procedure Laterality Date   ABDOMINAL EXPLORATION SURGERY     CHOLECYSTECTOMY  12/07/2020   COLONOSCOPY     CORONARY/GRAFT ACUTE MI REVASCULARIZATION N/A 07/28/2020   Procedure: CORONARY/GRAFT ACUTE MI REVASCULARIZATION;  Surgeon: Alwyn Pea, MD;  Location: [facility];  Service: Cardiovascular;  Laterality: N/A;   LEFT HEART CATH AND CORONARY ANGIOGRAPHY N/A 07/28/2020   Procedure: LEFT HEART CATH AND CORONARY ANGIOGRAPHY;  Surgeon: Alwyn Pea, MD;  Location: [facility];  Service: Cardiovascular;  Laterality: N/A;   NASAL SINUS SURGERY      Medications:  Current Outpatient Medications on File Prior to Visit  Medication Sig   albuterol (VENTOLIN HFA) 108 (90 Base) MCG/ACT  inhaler Inhale 2 puffs into the lungs every 6 (six) hours as needed for wheezing or shortness of breath.   aspirin EC 81 MG EC tablet Take 1 tablet (81 mg total) by mouth daily. Swallow whole.   Ensure (ENSURE) Take 237 mLs by mouth daily.   No current facility-administered medications on file prior to visit.    Allergies:  Allergies  Allergen Reactions   Levaquin [Levofloxacin] Swelling   Codeine Itching    Abdominal pain, nose itching   Codone [Hydrocodone] Other (See Comments)    Abdominal pain, felt loopy    Social History:  Social History   Socioeconomic History   Marital status: Divorced    Spouse name: Not on file   Number of children: Not on file  Years of education: Not on file   Highest education level: 12th grade  Occupational History   Occupation: unload trucks    Comment: [employer]  Tobacco Use   Smoking status: Former    Current packs/day: 0.00    Average packs/day: 0.5 packs/day for 47.0 years (23.5 ttl pk-yrs)    Types: Cigarettes    Start date: 07/28/1973    Quit date: 07/28/2020    Years since quitting: 2.7   Smokeless tobacco: Never   Tobacco comments:    08/24/20 given quit smoking paperwork and encouraged continued cessation  Vaping Use   Vaping status: Never Used  Substance and Sexual Activity   Alcohol use: Not Currently   Drug use: Never   Sexual activity: Yes  Other Topics Concern   Not on file  Social History Narrative   Live alone   Social Determinants of Health   Financial Resource Strain: Low Risk  (04/15/2023)   Overall Financial Resource Strain (CARDIA)    Difficulty of Paying Living Expenses: Not very hard  Food Insecurity: No Food Insecurity (04/15/2023)   Hunger Vital Sign    Worried About Running Out of Food in the Last Year: Never true    Ran Out of Food in the Last Year: Never true  Transportation Needs: No Transportation Needs (04/15/2023)   PRAPARE - Administrator, Civil Service (Medical): No    Lack of  Transportation (Non-Medical): No  Physical Activity: Insufficiently Active (04/15/2023)   Exercise Vital Sign    Days of Exercise per Week: 3 days    Minutes of Exercise per Session: 20 min  Stress: No Stress Concern Present (04/15/2023)   [employer] of Occupational Health - Occupational Stress Questionnaire    Feeling of Stress : Only a little  Social Connections: Moderately Isolated (04/15/2023)   Social Connection and Isolation Panel [NHANES]    Frequency of Communication with Friends and Family: Three times a week    Frequency of Social Gatherings with Friends and Family: Twice a week    Attends Religious Services: Never    Database administrator or Organizations: Yes    Attends Banker Meetings: 1 to 4 times per year    Marital Status: Divorced  Catering manager Violence: Not At Risk (01/15/2023)   Humiliation, Afraid, Rape, and Kick questionnaire    Fear of Current or Ex-Partner: No    Emotionally Abused: No    Physically Abused: No    Sexually Abused: No   Social History   Tobacco Use  Smoking Status Former   Current packs/day: 0.00   Average packs/day: 0.5 packs/day for 47.0 years (23.5 ttl pk-yrs)   Types: Cigarettes   Start date: 07/28/1973   Quit date: 07/28/2020   Years since quitting: 2.7  Smokeless Tobacco Never  Tobacco Comments   08/24/20 given quit smoking paperwork and encouraged continued cessation   Social History   Substance and Sexual Activity  Alcohol Use Not Currently    Family History:  Family History  Problem Relation Age of Onset   Alcohol abuse Father    Alcohol abuse Brother    Aneurysm Brother     Past medical history, surgical history, medications, allergies, family history and social history reviewed with patient today and changes made to appropriate areas of the chart.   ROS All other ROS negative except what is listed above and in the HPI.      Objective:    BP 129/76 (BP Location: Left Arm,  Patient Position:  Sitting, Cuff Size: Normal)   Pulse 67   Temp 98 F (36.7 C) (Oral)   Resp 18   Ht 5\' 8"  (1.727 m)   Wt 186 lb 3.2 oz (84.5 kg)   SpO2 96%   BMI 28.31 kg/m   Wt Readings from Last 3 Encounters:  04/16/23 186 lb 3.2 oz (84.5 kg)  01/15/23 193 lb (87.5 kg)  09/28/22 189 lb 1.6 oz (85.8 kg)    Physical Exam Vitals and nursing note reviewed.  Constitutional:      General: He is awake. He is not in acute distress.    Appearance: Normal appearance. He is well-developed and well-groomed. He is not ill-appearing or toxic-appearing.  HENT:     Head: Normocephalic and atraumatic.     Right Ear: Hearing, tympanic membrane, ear canal and external ear normal. No drainage.     Left Ear: Hearing, tympanic membrane, ear canal and external ear normal. No drainage.     Nose: Nose normal.     Mouth/Throat:     Pharynx: Uvula midline.  Eyes:     General: Lids are normal.        Right eye: No discharge.        Left eye: No discharge.     Extraocular Movements: Extraocular movements intact.     Conjunctiva/sclera: Conjunctivae normal.     Pupils: Pupils are equal, round, and reactive to light.     Visual Fields: Right eye visual fields normal and left eye visual fields normal.  Neck:     Thyroid: No thyromegaly.     Vascular: No carotid bruit or JVD.     Trachea: Trachea normal.  Cardiovascular:     Rate and Rhythm: Normal rate and regular rhythm.     Heart sounds: Normal heart sounds, S1 normal and S2 normal. No murmur heard.    No gallop.  Pulmonary:     Effort: Pulmonary effort is normal. No accessory muscle usage or respiratory distress.     Breath sounds: Normal breath sounds.  Abdominal:     General: Bowel sounds are normal.     Palpations: Abdomen is soft. There is no hepatomegaly or splenomegaly.     Tenderness: There is no abdominal tenderness.  Musculoskeletal:        General: Normal range of motion.     Cervical back: Normal range of motion and neck supple.     Right lower  leg: Edema (trace) present.     Left lower leg: Edema (trace) present.  Lymphadenopathy:     Head:     Right side of head: No submental, submandibular, tonsillar, preauricular or posterior auricular adenopathy.     Left side of head: No submental, submandibular, tonsillar, preauricular or posterior auricular adenopathy.     Cervical: No cervical adenopathy.  Skin:    General: Skin is warm and dry.     Capillary Refill: Capillary refill takes less than 2 seconds.     Findings: Bruising present. No rash.     Comments: Scattered bruising bilateral upper extremities  Neurological:     Mental Status: He is alert and oriented to person, place, and time.     Gait: Gait is intact.     Deep Tendon Reflexes: Reflexes are normal and symmetric.     Reflex Scores:      Brachioradialis reflexes are 2+ on the right side and 2+ on the left side.      Patellar reflexes are 2+ on  the right side and 2+ on the left side. Psychiatric:        Attention and Perception: Attention normal.        Mood and Affect: Mood normal.        Speech: Speech normal.        Behavior: Behavior normal. Behavior is cooperative.        Thought Content: Thought content normal.        Cognition and Memory: Cognition normal.       01/15/2023    9:09 AM 01/11/2022   10:04 AM 01/09/2021    9:55 AM 03/10/2019   10:02 AM  6CIT Screen  What Year? 0 points 0 points 0 points 0 points  What month? 0 points 0 points 0 points 0 points  What time? 0 points 0 points 0 points 0 points  Count back from 20 0 points 0 points 0 points 0 points  Months in reverse 0 points 0 points 0 points 0 points  Repeat phrase 0 points 0 points 0 points 0 points  Total Score 0 points 0 points 0 points 0 points   Results for orders placed or performed in visit on 09/28/22  Comprehensive metabolic panel  Result Value Ref Range   Glucose 102 (H) 70 - 99 mg/dL   BUN 9 8 - 27 mg/dL   Creatinine, Ser 1.61 0.76 - 1.27 mg/dL   eGFR 94 >09 UE/AVW/0.98    BUN/Creatinine Ratio 11 10 - 24   Sodium 140 134 - 144 mmol/L   Potassium 3.9 3.5 - 5.2 mmol/L   Chloride 101 96 - 106 mmol/L   CO2 25 20 - 29 mmol/L   Calcium 9.5 8.6 - 10.2 mg/dL   Total Protein 7.1 6.0 - 8.5 g/dL   Albumin 4.4 3.9 - 4.9 g/dL   Globulin, Total 2.7 1.5 - 4.5 g/dL   Albumin/Globulin Ratio 1.6 1.2 - 2.2   Bilirubin Total 1.4 (H) 0.0 - 1.2 mg/dL   Alkaline Phosphatase 98 44 - 121 IU/L   AST 19 0 - 40 IU/L   ALT 15 0 - 44 IU/L  Lipid Panel w/o Chol/HDL Ratio  Result Value Ref Range   Cholesterol, Total 133 100 - 199 mg/dL   Triglycerides 119 0 - 149 mg/dL   HDL 40 >14 mg/dL   VLDL Cholesterol Cal 25 5 - 40 mg/dL   LDL Chol Calc (NIH) 68 0 - 99 mg/dL      Assessment & Plan:   Problem List Items Addressed This Visit       Cardiovascular and Mediastinum   Aortic atherosclerosis (HCC)    Chronic.  Noted on past imaging.  Recommend he continue daily statin and ASA for prevention + complete cessation of smoking.      Relevant Medications   sildenafil (REVATIO) 20 MG tablet   metoprolol tartrate (LOPRESSOR) 25 MG tablet   lisinopril (ZESTRIL) 10 MG tablet   furosemide (LASIX) 20 MG tablet   atorvastatin (LIPITOR) 80 MG tablet   nitroGLYCERIN (NITROSTAT) 0.4 MG SL tablet   Other Relevant Orders   Comprehensive metabolic panel   CBC with Differential/Platelet   CAD, multiple vessel    Chronic, stable.  No recent CP.  Continue current medication regimen and collaboration with cardiology.      Relevant Medications   sildenafil (REVATIO) 20 MG tablet   metoprolol tartrate (LOPRESSOR) 25 MG tablet   lisinopril (ZESTRIL) 10 MG tablet   furosemide (LASIX) 20 MG tablet   atorvastatin (  LIPITOR) 80 MG tablet   nitroGLYCERIN (NITROSTAT) 0.4 MG SL tablet   Chronic systolic heart failure (HCC)    Since STEMI 07/28/20.  Euvolemic.  At this time continue current medication regimen and collaboration with cardiology.  Recommend: - Reminded to call for an overnight weight  gain of >2 pounds or a weekly weight gain of >5 pounds - not adding salt to food and read food labels. Reviewed the importance of keeping daily sodium intake to 2000mg  daily.  Highly recommend he cut back on sodium intake as suspect this is causing some edema. - Avoid Ibuprofen products.      Relevant Medications   sildenafil (REVATIO) 20 MG tablet   metoprolol tartrate (LOPRESSOR) 25 MG tablet   lisinopril (ZESTRIL) 10 MG tablet   furosemide (LASIX) 20 MG tablet   atorvastatin (LIPITOR) 80 MG tablet   nitroGLYCERIN (NITROSTAT) 0.4 MG SL tablet   Other Relevant Orders   Comprehensive metabolic panel   CBC with Differential/Platelet   Essential hypertension    Chronic, stable.  BP well at goal today.  Recommend he monitor BP at home three mornings a week and document for provider, educated him on this.  Continue diet and exercise focus, DASH diet.  Labs: CMP, CBC, TSH.  Urine ALB 10 (September 2023), recheck today.  Continue current medication regimen and collaboration with cardiology as needed.  Praised for ongoing adherence to regimen.  Refills sent in.      Relevant Medications   sildenafil (REVATIO) 20 MG tablet   metoprolol tartrate (LOPRESSOR) 25 MG tablet   lisinopril (ZESTRIL) 10 MG tablet   furosemide (LASIX) 20 MG tablet   atorvastatin (LIPITOR) 80 MG tablet   nitroGLYCERIN (NITROSTAT) 0.4 MG SL tablet   Other Relevant Orders   Comprehensive metabolic panel   TSH   Microalbumin, Urine Waived   Senile purpura (HCC)    Chronic.  As evidenced by bruising upper extremities with ASA use.  Recommend gentle skin care and monitor for wounds, if present immediately notify provider.      Relevant Medications   sildenafil (REVATIO) 20 MG tablet   metoprolol tartrate (LOPRESSOR) 25 MG tablet   lisinopril (ZESTRIL) 10 MG tablet   furosemide (LASIX) 20 MG tablet   atorvastatin (LIPITOR) 80 MG tablet   nitroGLYCERIN (NITROSTAT) 0.4 MG SL tablet   Other Relevant Orders    Comprehensive metabolic panel   CBC with Differential/Platelet   Stable angina (HCC)    Ongoing with STEMI 07/28/20, at this time will continue collaboration with cardiology as needed and medication regimen as prescribed.  No recent CP.      Relevant Medications   sildenafil (REVATIO) 20 MG tablet   metoprolol tartrate (LOPRESSOR) 25 MG tablet   lisinopril (ZESTRIL) 10 MG tablet   furosemide (LASIX) 20 MG tablet   atorvastatin (LIPITOR) 80 MG tablet   nitroGLYCERIN (NITROSTAT) 0.4 MG SL tablet     Respiratory   COPD (chronic obstructive pulmonary disease) (HCC) - Primary    Chronic, ongoing.  Spirometry FEV1/FVC 90% and FEV1 76% in August 2020 - will repeat next visit.   Recommend continued cessation smoking.  Continue current medication regimen, Anoro offering him benefit with minimal rescue use.  He refuses Lung CA Screening CT at this time due to current medical bills, recommended he obtain this.  Return in 6 months with repeat spirometry.      Relevant Medications   umeclidinium-vilanterol (ANORO ELLIPTA) 62.5-25 MCG/ACT AEPB   Other Relevant Orders  Comprehensive metabolic panel   CBC with Differential/Platelet     Digestive   Chronic active hepatitis (HCC)    Chronic, ongoing, completed treatment.  Continue collaboration with GI, provided him number to call and schedule with them for recheck.      Relevant Orders   Comprehensive metabolic panel   CBC with Differential/Platelet   Gastroesophageal reflux disease without esophagitis    Chronic, stable with Protonix, continue this and check Mag level today.  Risks of PPI use were discussed with patient including bone loss, C. Diff diarrhea, pneumonia, infections, CKD, electrolyte abnormalities.  Verbalizes understanding and chooses to continue the medication.       Relevant Medications   pantoprazole (PROTONIX) 40 MG tablet   Other Relevant Orders   Magnesium     Other   History of ST elevation myocardial infarction  (STEMI)    Stable at this time, event took place 07/28/20.  Recommend he continue all current medications and collaboration with cardiology.       HLD (hyperlipidemia)    Chronic, ongoing.  Continue current medication regimen and adjust as needed.  Lipid panel today.      Relevant Medications   sildenafil (REVATIO) 20 MG tablet   metoprolol tartrate (LOPRESSOR) 25 MG tablet   lisinopril (ZESTRIL) 10 MG tablet   furosemide (LASIX) 20 MG tablet   atorvastatin (LIPITOR) 80 MG tablet   nitroGLYCERIN (NITROSTAT) 0.4 MG SL tablet   Other Relevant Orders   Comprehensive metabolic panel   Lipid Panel w/o Chol/HDL Ratio   Other Visit Diagnoses     Benign prostatic hyperplasia without lower urinary tract symptoms       PSA on labs today.   Relevant Orders   PSA   Encounter for annual physical exam       Annual physical today -- refuses all vaccinations and colonoscopy, discussed at length.       Discussed aspirin prophylaxis for myocardial infarction prevention and decision was made to continue ASA  LABORATORY TESTING:  Health maintenance labs ordered today as discussed above.   The natural history of prostate cancer and ongoing controversy regarding screening and potential treatment outcomes of prostate cancer has been discussed with the patient. The meaning of a false positive PSA and a false negative PSA has been discussed. He indicates understanding of the limitations of this screening test and wishes to proceed with screening PSA testing.   IMMUNIZATIONS:   - Tdap: Tetanus vaccination status reviewed: Refused - Influenza: Refused - Pneumovax: Up to date - Prevnar: Up to date - Zostavax vaccine: Refused  SCREENING: - Colonoscopy: Refused  Discussed with patient purpose of the colonoscopy is to detect colon cancer at curable precancerous or early stages   - AAA Screening: Up to date  05/16/20 normal -Hearing Test: Not applicable  -Spirometry: Up to date   PATIENT  COUNSELING:    Sexuality: Discussed sexually transmitted diseases, partner selection, use of condoms, avoidance of unintended pregnancy  and contraceptive alternatives.   Advised to avoid cigarette smoking.  I discussed with the patient that most people either abstain from alcohol or drink within safe limits (<=14/week and <=4 drinks/occasion for males, <=7/weeks and <= 3 drinks/occasion for females) and that the risk for alcohol disorders and other health effects rises proportionally with the number of drinks per week and how often a drinker exceeds daily limits.  Discussed cessation/primary prevention of drug use and availability of treatment for abuse.   Diet: Encouraged to adjust caloric intake to  maintain  or achieve ideal body weight, to reduce intake of dietary saturated fat and total fat, to limit sodium intake by avoiding high sodium foods and not adding table salt, and to maintain adequate dietary potassium and calcium preferably from fresh fruits, vegetables, and low-fat dairy products.    Stressed the importance of regular exercise  Injury prevention: Discussed safety belts, safety helmets, smoke detector, smoking near bedding or upholstery.   Dental health: Discussed importance of regular tooth brushing, flossing, and dental visits.   Follow up plan: NEXT PREVENTATIVE PHYSICAL DUE IN 1 YEAR. Return in about 6 months (around 10/15/2023) for HTN/HLD, HF, COPD -- needs spirometry.

## 2023-04-16 NOTE — Assessment & Plan Note (Signed)
Chronic, stable.  BP well at goal today.  Recommend he monitor BP at home three mornings a week and document for provider, educated him on this.  Continue diet and exercise focus, DASH diet.  Labs: CMP, CBC, TSH.  Urine ALB 10 (September 2023), recheck today.  Continue current medication regimen and collaboration with cardiology as needed.  Praised for ongoing adherence to regimen.  Refills sent in.

## 2023-04-16 NOTE — Assessment & Plan Note (Signed)
Chronic.  Noted on past imaging.  Recommend he continue daily statin and ASA for prevention + complete cessation of smoking.

## 2023-04-16 NOTE — Assessment & Plan Note (Signed)
Stable at this time, event took place 07/28/20.  Recommend he continue all current medications and collaboration with cardiology.

## 2023-04-16 NOTE — Assessment & Plan Note (Signed)
Chronic, stable with Protonix, continue this and check Mag level today.  Risks of PPI use were discussed with patient including bone loss, C. Diff diarrhea, pneumonia, infections, CKD, electrolyte abnormalities.  Verbalizes understanding and chooses to continue the medication.

## 2023-04-17 LAB — CBC WITH DIFFERENTIAL/PLATELET
Basophils Absolute: 0.1 10*3/uL (ref 0.0–0.2)
Basos: 1 %
EOS (ABSOLUTE): 0.5 10*3/uL — ABNORMAL HIGH (ref 0.0–0.4)
Eos: 5 %
Hematocrit: 46.7 % (ref 37.5–51.0)
Hemoglobin: 15.1 g/dL (ref 13.0–17.7)
Immature Grans (Abs): 0 10*3/uL (ref 0.0–0.1)
Immature Granulocytes: 0 %
Lymphocytes Absolute: 3 10*3/uL (ref 0.7–3.1)
Lymphs: 33 %
MCH: 31.7 pg (ref 26.6–33.0)
MCHC: 32.3 g/dL (ref 31.5–35.7)
MCV: 98 fL — ABNORMAL HIGH (ref 79–97)
Monocytes Absolute: 1 10*3/uL — ABNORMAL HIGH (ref 0.1–0.9)
Monocytes: 11 %
Neutrophils Absolute: 4.6 10*3/uL (ref 1.4–7.0)
Neutrophils: 50 %
Platelets: 323 10*3/uL (ref 150–450)
RBC: 4.77 x10E6/uL (ref 4.14–5.80)
RDW: 12.9 % (ref 11.6–15.4)
WBC: 9.1 10*3/uL (ref 3.4–10.8)

## 2023-04-17 LAB — COMPREHENSIVE METABOLIC PANEL
ALT: 13 [IU]/L (ref 0–44)
AST: 17 [IU]/L (ref 0–40)
Albumin: 4.4 g/dL (ref 3.9–4.9)
Alkaline Phosphatase: 100 [IU]/L (ref 44–121)
BUN/Creatinine Ratio: 13 (ref 10–24)
BUN: 10 mg/dL (ref 8–27)
Bilirubin Total: 1.4 mg/dL — ABNORMAL HIGH (ref 0.0–1.2)
CO2: 25 mmol/L (ref 20–29)
Calcium: 9.7 mg/dL (ref 8.6–10.2)
Chloride: 100 mmol/L (ref 96–106)
Creatinine, Ser: 0.8 mg/dL (ref 0.76–1.27)
Globulin, Total: 2.9 g/dL (ref 1.5–4.5)
Glucose: 71 mg/dL (ref 70–99)
Potassium: 4.3 mmol/L (ref 3.5–5.2)
Sodium: 139 mmol/L (ref 134–144)
Total Protein: 7.3 g/dL (ref 6.0–8.5)
eGFR: 96 mL/min/{1.73_m2} (ref >59)

## 2023-04-17 LAB — LIPID PANEL W/O CHOL/HDL RATIO
Cholesterol, Total: 137 mg/dL (ref 100–199)
HDL: 38 mg/dL — ABNORMAL LOW (ref >39)
LDL Chol Calc (NIH): 71 mg/dL (ref 0–99)
Triglycerides: 160 mg/dL — ABNORMAL HIGH (ref 0–149)
VLDL Cholesterol Cal: 28 mg/dL (ref 5–40)

## 2023-04-17 LAB — TSH: TSH: 0.795 u[IU]/mL (ref 0.450–4.500)

## 2023-04-17 LAB — MAGNESIUM: Magnesium: 2.1 mg/dL (ref 1.6–2.3)

## 2023-04-17 LAB — PSA: Prostate Specific Ag, Serum: 1.8 ng/mL (ref 0.0–4.0)

## 2023-04-17 NOTE — Progress Notes (Signed)
Contacted via MyChart   Good afternoon Andrew Potts, your labs have returned and overall they look great.  Continue all current medications.  Great job!!!!  Any questions? Keep being excellent!!  Thank you for allowing me to participate in your care.  I appreciate you. Kindest regards, Aman Batley

## 2023-05-13 ENCOUNTER — Telehealth: Payer: Self-pay | Admitting: Nurse Practitioner

## 2023-05-13 NOTE — Telephone Encounter (Unsigned)
Copied from CRM [id number]. Topic: General - Other >> May 13, 2023  3:55 PM Epimenio Foot F wrote: Reason for CRM: Pt is calling in requesting to speak with Jolene or her nurse. Please follow up with pt.

## 2023-05-16 NOTE — Telephone Encounter (Signed)
Called and spoke with patient. Discussed concerns with patient and answered questions.

## 2023-05-21 ENCOUNTER — Other Ambulatory Visit: Payer: Self-pay

## 2023-05-21 ENCOUNTER — Other Ambulatory Visit: Payer: Self-pay | Admitting: Nurse Practitioner

## 2023-05-21 NOTE — Patient Outreach (Signed)
Care Management   Visit Note  05/21/2023 Name: Andrew Potts MRN: [id number] DOB: 1950-11-17  Subjective: Andrew Potts is a 72 y.o. year old male who is a primary care patient of [name], Dorie Rank, NP. The Care Management team was consulted for assistance.      Engaged with patient spoke with patient by telephone.    Goals Addressed             This Visit's Progress    RNCM Care Management  Expected Outcome:  Monitor, Self-Manage, and Reduce Symptoms of Hypertension       Current Barriers:  Knowledge Deficits related to normalized blood pressures and the need to maintain systolic blood pressures of <150 and diastolic <90 Care Coordination needs related to the proper use of blood pressure cuff in a patient with HTN Chronic Disease Management support and education needs related to effective management of HTN  BP Readings from Last 3 Encounters:  04/16/23 129/76  09/28/22 118/80  04/12/22 138/80    Planned Interventions: Evaluation of current treatment plan related to hypertension self management and patient's adherence to plan as established by provider. The patient states that he is doing well. Saw the pcp recently and had a good visit, does not need to see the pcp again until next year.  Provided education to patient re: stroke prevention, s/s of heart attack and stroke. The patient has had a HA in the past. Is mindful of taking his medications and monitoring for changes in his health and well being.  Reviewed prescribed diet heart healthy diet. Review and education provided. Reviewed medications with patient and discussed importance of compliance. The patient denies any issues with orthostatic hypotension since having changes in medications. The patient knows about side effects to look for. Education and support given. Review of pharm D support and ongoing medication needs. The patient verbalized understanding.  Discussed plans with patient for ongoing care management follow  up and provided patient with direct contact information for care management team; Advised patient, providing education and rationale, to monitor blood pressure daily and record, calling PCP for findings outside established parameters. Education on the goal of systolic blood pressures of <150 and diastolic pressures of <90 Reviewed scheduled/upcoming provider appointments including: 10-15-2023, knows to call for changes or new needs before next pcp appointment Advised patient to discuss changes in blood pressures, and review of his home monitor in the office compared to the office monitor with provider; Provided education on prescribed diet heart healthy;  Discussed complications of poorly controlled blood pressure such as heart disease, stroke, circulatory complications, vision complications, kidney impairment, sexual dysfunction;  Screening for signs and symptoms of depression related to chronic disease state;  Assessed social determinant of health barriers; The patient is doing well. The patient is having some swelling but not much. Is taking his Lasix as directed. Education and support given.   Symptom Management: Take medications as prescribed   Attend all scheduled provider appointments Call provider office for new concerns or questions  call the Suicide and Crisis Lifeline: 988 call the [place] National Suicide Prevention Lifeline: (315) 070-1039 or TTY: 405-851-1666 TTY 616-241-9479) to talk to a trained counselor call 1-800-273-TALK (toll free, 24 hour hotline) if experiencing a Mental Health or Behavioral Health Crisis  check blood pressure 3 times per week write blood pressure results in a log or diary learn about high blood pressure keep a blood pressure log take blood pressure log to all doctor appointments call doctor for signs and  symptoms of high blood pressure develop an action plan for high blood pressure keep all doctor appointments take medications for blood pressure exactly as  prescribed report new symptoms to your doctor  Follow Up Plan: Telephone follow up appointment with care management team member scheduled for: 07-29-2022 at 1115 am       RNCM Care Management Expected Outcome:  Monitor, Self-Manage and Reduce Symptoms of Heart Failure       Current Barriers:  Chronic Disease Management support and education needs related to effective management of HF  Wt Readings from Last 3 Encounters:  04/16/23 186 lb 3.2 oz (84.5 kg)  01/15/23 193 lb (87.5 kg)  09/28/22 189 lb 1.6 oz (85.8 kg)    Planned Interventions: Basic overview and discussion of pathophysiology of Heart Failure reviewed. The patient has a good understanding of his heart failure and knows changes to watch for. His weight is stable and he denies any acute changes in his HF or heart health.  Provided education on low sodium diet. Review and education provided. States he has gained weight but he is exercising and trying to stay around 181 to 188 Reviewed Heart Failure Action Plan in depth and provided written copy Assessed need for readable accurate scales in home. Has scales, weighs daily, weight stays around 185 recently. He knows to monitor for swelling and edema Provided education about placing scale on hard, flat surface Advised patient to weigh each morning after emptying bladder Discussed importance of daily weight and advised patient to weigh and record daily Reviewed role of diuretics in prevention of fluid overload and management of heart failure. Review of Lasix and taking his medications. Denies any issues with medication compliance and talked to the pharm D recently. Will continue to monitor Discussed the importance of keeping all appointments with provider. Sees cardiologist tomorrow on 05-22-2023 and will ask about being on a blood thinner. Was on Plavix at one time but has not been in several years. The patient has questions to right down to ask the provider. Will continue to monitor. Will  see the pcp next year unless needed before then.  Provided patient with education about the role of exercise in the management of heart failure Advised patient to discuss changes in weight, changes in swelling/edema in feet/legs/abdomen, new questions or concerns about heart failure or heart health with provider Screening for signs and symptoms of depression related to chronic disease state  Assessed social determinant of health barriers  Symptom Management: Take medications as prescribed   Attend all scheduled provider appointments Call provider office for new concerns or questions  call the Suicide and Crisis Lifeline: 988 call the [place] National Suicide Prevention Lifeline: (614) 542-8525 or TTY: 336-475-5271 TTY 406-223-0042) to talk to a trained counselor call 1-800-273-TALK (toll free, 24 hour hotline) if experiencing a Mental Health or Behavioral Health Crisis  call office if I gain more than 2 pounds in one day or 5 pounds in one week track weight in diary use salt in moderation watch for swelling in feet, ankles and legs every day weigh myself daily bring diary to all appointments develop a rescue plan follow rescue plan if symptoms flare-up know when to call the doctorfor changes in heart failure, or acute weight changes track symptoms and what helps feel better or worse dress right for the weather, hot or cold  Follow Up Plan: Telephone follow up appointment with care management team member scheduled for: 07-30-2023 at 1115 am       RNCM  Care Management:  Maintain, Monitor and Self-Manage Symptoms of COPD       Current Barriers:  Care Coordination needs related to supply of Symbicort and paperwork that the patient has to get Anoro approved in a patient with COPD Chronic Disease Management support and education needs related to effective management of COPD  Planned Interventions: Provided patient with basic written and verbal COPD education on self care/management/and  exacerbation prevention. The patient is stable. States he has to be careful with all the weather changes. Does notice changes in his breathing when he ties his shoes. The patient states that he is no longer smoking. Denies any acute changes in his breathing.The patient is pacing his activity and monitoring for changes in his breathing. Recently moved over the last couple of weeks. Is happy to be in his new apartment and feels this is going to work so much better for him. He has gotten settled in and is doing well and loves his new place. Denies any new concerns related to his breathing.  Advised patient to track and manage COPD triggers. Review of triggers and the patient knows how to manage changes.  Provided written and verbal instructions on pursed lip breathing and utilized returned demonstration as teach back Provided instruction about proper use of medications used for management of COPD including inhalers. The patient states after seeing pcp that he received more Symbicort in the mail. He has an 8 month supply. The patient has not filled out the paperwork for Anoro PAP for 2024 because he wanted to talk to pcp about Symbicort supply. The patient has his Anoro paperwork and will fill it out and take it in with him to see the pcp in March. The patient still has a good supply of his Symbicort. Education and support given.  Advised patient to self assesses COPD action plan zone and make appointment with provider if in the yellow zone for 48 hours without improvement Advised patient to engage in light exercise as tolerated 3-5 days a week to aid in the the management of COPD. He continues to exercise and walking is what he does. Does not want to gain a lot of weight Provided education about and advised patient to utilize infection prevention strategies to reduce risk of respiratory infection. Review and education provided. The patient is doing well and staying in especially with weather changes. Discussed  the importance of adequate rest and management of fatigue with COPD Screening for signs and symptoms of depression related to chronic disease state  Assessed social determinant of health barriers Vaccinations are up to date. He took the flu vaccine last week. Denies any issues with vaccination.   Symptom Management: Take medications as prescribed   Attend all scheduled provider appointments Call provider office for new concerns or questions  call the Suicide and Crisis Lifeline: 988 call the [place] National Suicide Prevention Lifeline: 351 485 6229 or TTY: 973-253-8809 TTY (857)326-0458) to talk to a trained counselor call 1-800-273-TALK (toll free, 24 hour hotline) if experiencing a Mental Health or Behavioral Health Crisis  eliminate smoking in my home identify and remove indoor air pollutants limit outdoor activity during cold weather listen for public air quality announcements every day do breathing exercises every day develop a rescue plan eliminate symptom triggers at home follow rescue plan if symptoms flare-up  Follow Up Plan: Telephone follow up appointment with care management team member scheduled for: 07-30-2023 at 1115 am           Consent to Services:  Patient was given information about care management services, agreed to services, and gave verbal consent to participate.   Plan: Telephone follow up appointment with care management team member scheduled for: 07-30-2023 at 1115 am with Kemper Durie: [phone]  Alto Denver RN, MSN, CCM RN Care Manager  [facility]  Ambulatory Care Management  Direct Number: [phone]

## 2023-05-21 NOTE — Patient Instructions (Signed)
Visit Information  Thank you for taking time to visit with me today. Please don't hesitate to contact me if I can be of assistance to you before our next scheduled telephone appointment.  Following are the goals we discussed today:   Goals Addressed             This Visit's Progress    RNCM Care Management  Expected Outcome:  Monitor, Self-Manage, and Reduce Symptoms of Hypertension       Current Barriers:  Knowledge Deficits related to normalized blood pressures and the need to maintain systolic blood pressures of <150 and diastolic <90 Care Coordination needs related to the proper use of blood pressure cuff in a patient with HTN Chronic Disease Management support and education needs related to effective management of HTN  BP Readings from Last 3 Encounters:  04/16/23 129/76  09/28/22 118/80  04/12/22 138/80    Planned Interventions: Evaluation of current treatment plan related to hypertension self management and patient's adherence to plan as established by provider. The patient states that he is doing well. Saw the pcp recently and had a good visit, does not need to see the pcp again until next year.  Provided education to patient re: stroke prevention, s/s of heart attack and stroke. The patient has had a HA in the past. Is mindful of taking his medications and monitoring for changes in his health and well being.  Reviewed prescribed diet heart healthy diet. Review and education provided. Reviewed medications with patient and discussed importance of compliance. The patient denies any issues with orthostatic hypotension since having changes in medications. The patient knows about side effects to look for. Education and support given. Review of pharm D support and ongoing medication needs. The patient verbalized understanding.  Discussed plans with patient for ongoing care management follow up and provided patient with direct contact information for care management team; Advised  patient, providing education and rationale, to monitor blood pressure daily and record, calling PCP for findings outside established parameters. Education on the goal of systolic blood pressures of <150 and diastolic pressures of <90 Reviewed scheduled/upcoming provider appointments including: 10-15-2023, knows to call for changes or new needs before next pcp appointment Advised patient to discuss changes in blood pressures, and review of his home monitor in the office compared to the office monitor with provider; Provided education on prescribed diet heart healthy;  Discussed complications of poorly controlled blood pressure such as heart disease, stroke, circulatory complications, vision complications, kidney impairment, sexual dysfunction;  Screening for signs and symptoms of depression related to chronic disease state;  Assessed social determinant of health barriers; The patient is doing well. The patient is having some swelling but not much. Is taking his Lasix as directed. Education and support given.   Symptom Management: Take medications as prescribed   Attend all scheduled provider appointments Call provider office for new concerns or questions  call the Suicide and Crisis Lifeline: 988 call the [place] National Suicide Prevention Lifeline: 303 879 7738 or TTY: 902-117-7402 TTY 256-589-4583) to talk to a trained counselor call 1-800-273-TALK (toll free, 24 hour hotline) if experiencing a Mental Health or Behavioral Health Crisis  check blood pressure 3 times per week write blood pressure results in a log or diary learn about high blood pressure keep a blood pressure log take blood pressure log to all doctor appointments call doctor for signs and symptoms of high blood pressure develop an action plan for high blood pressure keep all doctor appointments take medications for  blood pressure exactly as prescribed report new symptoms to your doctor  Follow Up Plan: Telephone follow up  appointment with care management team member scheduled for: 07-29-2022 at 1115 am       RNCM Care Management Expected Outcome:  Monitor, Self-Manage and Reduce Symptoms of Heart Failure       Current Barriers:  Chronic Disease Management support and education needs related to effective management of HF  Wt Readings from Last 3 Encounters:  04/16/23 186 lb 3.2 oz (84.5 kg)  01/15/23 193 lb (87.5 kg)  09/28/22 189 lb 1.6 oz (85.8 kg)    Planned Interventions: Basic overview and discussion of pathophysiology of Heart Failure reviewed. The patient has a good understanding of his heart failure and knows changes to watch for. His weight is stable and he denies any acute changes in his HF or heart health.  Provided education on low sodium diet. Review and education provided. States he has gained weight but he is exercising and trying to stay around 181 to 188 Reviewed Heart Failure Action Plan in depth and provided written copy Assessed need for readable accurate scales in home. Has scales, weighs daily, weight stays around 185 recently. He knows to monitor for swelling and edema Provided education about placing scale on hard, flat surface Advised patient to weigh each morning after emptying bladder Discussed importance of daily weight and advised patient to weigh and record daily Reviewed role of diuretics in prevention of fluid overload and management of heart failure. Review of Lasix and taking his medications. Denies any issues with medication compliance and talked to the pharm D recently. Will continue to monitor Discussed the importance of keeping all appointments with provider. Sees cardiologist tomorrow on 05-22-2023 and will ask about being on a blood thinner. Was on Plavix at one time but has not been in several years. The patient has questions to right down to ask the provider. Will continue to monitor. Will see the pcp next year unless needed before then.  Provided patient with education  about the role of exercise in the management of heart failure Advised patient to discuss changes in weight, changes in swelling/edema in feet/legs/abdomen, new questions or concerns about heart failure or heart health with provider Screening for signs and symptoms of depression related to chronic disease state  Assessed social determinant of health barriers  Symptom Management: Take medications as prescribed   Attend all scheduled provider appointments Call provider office for new concerns or questions  call the Suicide and Crisis Lifeline: 988 call the [place] National Suicide Prevention Lifeline: (205) 302-5865 or TTY: 518 505 4622 TTY (959)245-2374) to talk to a trained counselor call 1-800-273-TALK (toll free, 24 hour hotline) if experiencing a Mental Health or Behavioral Health Crisis  call office if I gain more than 2 pounds in one day or 5 pounds in one week track weight in diary use salt in moderation watch for swelling in feet, ankles and legs every day weigh myself daily bring diary to all appointments develop a rescue plan follow rescue plan if symptoms flare-up know when to call the doctorfor changes in heart failure, or acute weight changes track symptoms and what helps feel better or worse dress right for the weather, hot or cold  Follow Up Plan: Telephone follow up appointment with care management team member scheduled for: 07-30-2023 at 1115 am       RNCM Care Management:  Maintain, Monitor and Self-Manage Symptoms of COPD       Current Barriers:  Care  Coordination needs related to supply of Symbicort and paperwork that the patient has to get Anoro approved in a patient with COPD Chronic Disease Management support and education needs related to effective management of COPD  Planned Interventions: Provided patient with basic written and verbal COPD education on self care/management/and exacerbation prevention. The patient is stable. States he has to be careful with all the  weather changes. Does notice changes in his breathing when he ties his shoes. The patient states that he is no longer smoking. Denies any acute changes in his breathing.The patient is pacing his activity and monitoring for changes in his breathing. Recently moved over the last couple of weeks. Is happy to be in his new apartment and feels this is going to work so much better for him. He has gotten settled in and is doing well and loves his new place. Denies any new concerns related to his breathing.  Advised patient to track and manage COPD triggers. Review of triggers and the patient knows how to manage changes.  Provided written and verbal instructions on pursed lip breathing and utilized returned demonstration as teach back Provided instruction about proper use of medications used for management of COPD including inhalers. The patient states after seeing pcp that he received more Symbicort in the mail. He has an 8 month supply. The patient has not filled out the paperwork for Anoro PAP for 2024 because he wanted to talk to pcp about Symbicort supply. The patient has his Anoro paperwork and will fill it out and take it in with him to see the pcp in March. The patient still has a good supply of his Symbicort. Education and support given.  Advised patient to self assesses COPD action plan zone and make appointment with provider if in the yellow zone for 48 hours without improvement Advised patient to engage in light exercise as tolerated 3-5 days a week to aid in the the management of COPD. He continues to exercise and walking is what he does. Does not want to gain a lot of weight Provided education about and advised patient to utilize infection prevention strategies to reduce risk of respiratory infection. Review and education provided. The patient is doing well and staying in especially with weather changes. Discussed the importance of adequate rest and management of fatigue with COPD Screening for signs  and symptoms of depression related to chronic disease state  Assessed social determinant of health barriers Vaccinations are up to date. He took the flu vaccine last week. Denies any issues with vaccination.   Symptom Management: Take medications as prescribed   Attend all scheduled provider appointments Call provider office for new concerns or questions  call the Suicide and Crisis Lifeline: 988 call the [place] National Suicide Prevention Lifeline: (856)581-3777 or TTY: 559-835-5876 TTY 276-528-8611) to talk to a trained counselor call 1-800-273-TALK (toll free, 24 hour hotline) if experiencing a Mental Health or Behavioral Health Crisis  eliminate smoking in my home identify and remove indoor air pollutants limit outdoor activity during cold weather listen for public air quality announcements every day do breathing exercises every day develop a rescue plan eliminate symptom triggers at home follow rescue plan if symptoms flare-up  Follow Up Plan: Telephone follow up appointment with care management team member scheduled for: 07-30-2023 at 1115 am           Our next appointment is by telephone on 07-30-2023 at 1115 am with Kemper Durie at [phone]  Please call the care guide team at  [phone] if you need to cancel or reschedule your appointment.   If you are experiencing a Mental Health or Behavioral Health Crisis or need someone to talk to, please call the Suicide and Crisis Lifeline: 988 call the [place] National Suicide Prevention Lifeline: 970-689-7254 or TTY: 864-419-2037 TTY 803-086-1725) to talk to a trained counselor call 1-800-273-TALK (toll free, 24 hour hotline)   Patient verbalizes understanding of instructions and care plan provided today and agrees to view in MyChart. Active MyChart status and patient understanding of how to access instructions and care plan via MyChart confirmed with patient.       Alto Denver RN, MSN, CCM RN Care Manager  [facility]   Ambulatory Care Management  Direct Number: [phone]

## 2023-05-22 DIAGNOSIS — E782 Mixed hyperlipidemia: Secondary | ICD-10-CM | POA: Diagnosis not present

## 2023-05-22 DIAGNOSIS — I10 Essential (primary) hypertension: Secondary | ICD-10-CM | POA: Diagnosis not present

## 2023-05-22 DIAGNOSIS — I2510 Atherosclerotic heart disease of native coronary artery without angina pectoris: Secondary | ICD-10-CM | POA: Diagnosis not present

## 2023-05-22 DIAGNOSIS — R0602 Shortness of breath: Secondary | ICD-10-CM | POA: Diagnosis not present

## 2023-05-22 NOTE — Telephone Encounter (Signed)
Requested Prescriptions  Pending Prescriptions Disp Refills   sildenafil (REVATIO) 20 MG tablet [Pharmacy Med Name: SILDENAFIL 20 MG TABLET] 90 tablet 1    Sig: Take 1 tablet (20 mg total) by mouth daily as needed (erectile dysfunction).     Urology: Erectile Dysfunction Agents Passed - 05/21/2023  4:06 PM      Passed - AST in normal range and within 360 days    AST  Date Value Ref Range Status  04/16/2023 17 0 - 40 IU/L Final         Passed - ALT in normal range and within 360 days    ALT  Date Value Ref Range Status  04/16/2023 13 0 - 44 IU/L Final         Passed - Last BP in normal range    BP Readings from Last 1 Encounters:  04/16/23 129/76         Passed - Valid encounter within last 12 months    Recent Outpatient Visits           1 month ago Simple chronic bronchitis (HCC)   [place] [facility] [place], [place] T, NP   7 months ago Chronic systolic heart failure (HCC)   [place] [facility] [place], Corrie Dandy T, NP   1 year ago Skin tags, multiple acquired   [place] [facility] [place], Corrie Dandy T, NP   1 year ago Chronic active hepatitis [facility])   [place] [facility] [place], Corrie Dandy T, NP   1 year ago Chronic systolic heart failure (HCC)   [place] [facility] [place], Dorie Rank, NP       Future Appointments             In 4 months [name], Dorie Rank, NP [facility] [facility]

## 2023-06-17 ENCOUNTER — Telehealth: Payer: Self-pay | Admitting: Nurse Practitioner

## 2023-06-17 ENCOUNTER — Encounter: Payer: Self-pay | Admitting: Family Medicine

## 2023-06-17 ENCOUNTER — Ambulatory Visit: Payer: Medicare HMO | Admitting: Family Medicine

## 2023-06-17 VITALS — BP 122/71 | HR 84 | Ht 68.0 in | Wt 190.4 lb

## 2023-06-17 DIAGNOSIS — M5442 Lumbago with sciatica, left side: Secondary | ICD-10-CM | POA: Diagnosis not present

## 2023-06-17 DIAGNOSIS — M5441 Lumbago with sciatica, right side: Secondary | ICD-10-CM

## 2023-06-17 MED ORDER — KETOROLAC TROMETHAMINE 60 MG/2ML IM SOLN
60.0000 mg | Freq: Once | INTRAMUSCULAR | Status: AC
Start: 2023-06-17 — End: 2023-06-17
  Administered 2023-06-17: 60 mg via INTRAMUSCULAR

## 2023-06-17 MED ORDER — BACLOFEN 10 MG PO TABS: 5.0000 mg | ORAL_TABLET | Freq: Every evening | ORAL | 0 refills | Status: DC | PRN

## 2023-06-17 MED ORDER — MELOXICAM 15 MG PO TABS: 15.0000 mg | ORAL_TABLET | Freq: Every day | ORAL | 0 refills | Status: DC

## 2023-06-17 NOTE — Progress Notes (Signed)
BP 122/71   Pulse 84   Ht 5\' 8"  (1.727 m)   Wt 190 lb 6.4 oz (86.4 kg)   SpO2 95%   BMI 28.95 kg/m    Subjective:    Patient ID: Andrew Potts, male    DOB: Oct 18, 1950, 72 y.o.   MRN: [id number]  HPI: Andrew Potts is a 72 y.o. male  Chief Complaint  Patient presents with   Back Pain    Patient says Thursday, he reached down to charge his phone, he coughed and hurt his back. Patient says he has been having an issue with a productive cough with taking the Anoro Inhaler. Patient says he has been taking an old prescription of Ibuprofen and took a 1/2 tablet Friday and it helped a little bit. Patient says he only took the half as he was informed in the past to not take the Ibuprofen. Patient says he has taken 3 Aspirins today.   BACK PAIN Duration: about 5 days Mechanism of injury: reaching Location: bilateral and low back Onset: sudden Severity: severe Quality: aching pain, occasionally sharp and shooting Frequency: constant Radiation: R leg above the knee and L leg above the knee Aggravating factors: moving Alleviating factors: aspirin Status: better Treatments attempted: rest and ibuprofen  Relief with NSAIDs?: significant Nighttime pain:  yes Paresthesias / decreased sensation:  no Bowel / bladder incontinence:  no Fevers:  no Dysuria / urinary frequency:  no   Relevant past medical, surgical, family and social history reviewed and updated as indicated. Interim medical history since our last visit reviewed. Allergies and medications reviewed and updated.  Review of Systems  Constitutional: Negative.   Respiratory: Negative.    Cardiovascular: Negative.   Gastrointestinal: Negative.   Musculoskeletal:  Positive for back pain and myalgias. Negative for arthralgias, gait problem, joint swelling, neck pain and neck stiffness.  Skin: Negative.   Neurological: Negative.   Psychiatric/Behavioral: Negative.      Per HPI unless specifically indicated above      Objective:    BP 122/71   Pulse 84   Ht 5\' 8"  (1.727 m)   Wt 190 lb 6.4 oz (86.4 kg)   SpO2 95%   BMI 28.95 kg/m   Wt Readings from Last 3 Encounters:  06/17/23 190 lb 6.4 oz (86.4 kg)  04/16/23 186 lb 3.2 oz (84.5 kg)  01/15/23 193 lb (87.5 kg)    Physical Exam Vitals and nursing note reviewed.  Constitutional:      General: He is not in acute distress.    Appearance: Normal appearance. He is not ill-appearing, toxic-appearing or diaphoretic.  HENT:     Head: Normocephalic and atraumatic.     Right Ear: External ear normal.     Left Ear: External ear normal.     Nose: Nose normal.     Mouth/Throat:     Mouth: Mucous membranes are moist.     Pharynx: Oropharynx is clear.  Eyes:     General: No scleral icterus.       Right eye: No discharge.        Left eye: No discharge.     Extraocular Movements: Extraocular movements intact.     Conjunctiva/sclera: Conjunctivae normal.     Pupils: Pupils are equal, round, and reactive to light.  Cardiovascular:     Rate and Rhythm: Normal rate and regular rhythm.     Pulses: Normal pulses.     Heart sounds: Normal heart sounds. No murmur heard.  No friction rub. No gallop.  Pulmonary:     Effort: Pulmonary effort is normal. No respiratory distress.     Breath sounds: Normal breath sounds. No stridor. No wheezing, rhonchi or rales.  Chest:     Chest wall: No tenderness.  Musculoskeletal:        General: Normal range of motion.     Cervical back: Normal range of motion and neck supple.     Comments: Spasm bilateral low back with tenderness  Skin:    General: Skin is warm and dry.     Capillary Refill: Capillary refill takes less than 2 seconds.     Coloration: Skin is not jaundiced or pale.     Findings: No bruising, erythema, lesion or rash.  Neurological:     General: No focal deficit present.     Mental Status: He is alert and oriented to person, place, and time. Mental status is at baseline.  Psychiatric:        Mood  and Affect: Mood normal.        Behavior: Behavior normal.        Thought Content: Thought content normal.        Judgment: Judgment normal.     Results for orders placed or performed in visit on 04/16/23  Comprehensive metabolic panel  Result Value Ref Range   Glucose 71 70 - 99 mg/dL   BUN 10 8 - 27 mg/dL   Creatinine, Ser 1.61 0.76 - 1.27 mg/dL   eGFR 96 >09 UE/AVW/0.98   BUN/Creatinine Ratio 13 10 - 24   Sodium 139 134 - 144 mmol/L   Potassium 4.3 3.5 - 5.2 mmol/L   Chloride 100 96 - 106 mmol/L   CO2 25 20 - 29 mmol/L   Calcium 9.7 8.6 - 10.2 mg/dL   Total Protein 7.3 6.0 - 8.5 g/dL   Albumin 4.4 3.9 - 4.9 g/dL   Globulin, Total 2.9 1.5 - 4.5 g/dL   Bilirubin Total 1.4 (H) 0.0 - 1.2 mg/dL   Alkaline Phosphatase 100 44 - 121 IU/L   AST 17 0 - 40 IU/L   ALT 13 0 - 44 IU/L  CBC with Differential/Platelet  Result Value Ref Range   WBC 9.1 3.4 - 10.8 x10E3/uL   RBC 4.77 4.14 - 5.80 x10E6/uL   Hemoglobin 15.1 13.0 - 17.7 g/dL   Hematocrit 11.9 14.7 - 51.0 %   MCV 98 (H) 79 - 97 fL   MCH 31.7 26.6 - 33.0 pg   MCHC 32.3 31.5 - 35.7 g/dL   RDW 82.9 56.2 - 13.0 %   Platelets 323 150 - 450 x10E3/uL   Neutrophils 50 Not Estab. %   Lymphs 33 Not Estab. %   Monocytes 11 Not Estab. %   Eos 5 Not Estab. %   Basos 1 Not Estab. %   Neutrophils Absolute 4.6 1.4 - 7.0 x10E3/uL   Lymphocytes Absolute 3.0 0.7 - 3.1 x10E3/uL   Monocytes Absolute 1.0 (H) 0.1 - 0.9 x10E3/uL   EOS (ABSOLUTE) 0.5 (H) 0.0 - 0.4 x10E3/uL   Basophils Absolute 0.1 0.0 - 0.2 x10E3/uL   Immature Granulocytes 0 Not Estab. %   Immature Grans (Abs) 0.0 0.0 - 0.1 x10E3/uL  Lipid Panel w/o Chol/HDL Ratio  Result Value Ref Range   Cholesterol, Total 137 100 - 199 mg/dL   Triglycerides 865 (H) 0 - 149 mg/dL   HDL 38 (L) >78 mg/dL   VLDL Cholesterol Cal 28 5 - 40 mg/dL  LDL Chol Calc (NIH) 71 0 - 99 mg/dL  TSH  Result Value Ref Range   TSH 0.795 0.450 - 4.500 uIU/mL  Magnesium  Result Value Ref Range    Magnesium 2.1 1.6 - 2.3 mg/dL  PSA  Result Value Ref Range   Prostate Specific Ag, Serum 1.8 0.0 - 4.0 ng/mL  Microalbumin, Urine Waived  Result Value Ref Range   Microalb, Ur Waived 10 0 - 19 mg/L   Creatinine, Urine Waived 50 10 - 300 mg/dL   Microalb/Creat Ratio <30 <30 mg/g      Assessment & Plan:   Problem List Items Addressed This Visit   None Visit Diagnoses     Acute bilateral low back pain with bilateral sciatica    -  Primary   Will treat with toradol, meloxicam and baclofen. Stretches given. Call with any concerns or if not getting better. Follow up with PCP in about 2 weeks.   Relevant Medications   ketorolac (TORADOL) injection 60 mg   baclofen (LIORESAL) 10 MG tablet   meloxicam (MOBIC) 15 MG tablet        Follow up plan: Return in about 2 weeks (around 07/01/2023) for with Jolene.

## 2023-06-17 NOTE — Telephone Encounter (Signed)
Patient is complaining of back. He is requesting a to see provider to get a shot for pain (NOW ) also inquiring on taking 800mg  ibuprofen. Please advise? Provider Aura Dials, NP schedule is full for today. So I scheduled an appointment with Dr. Evelene Croon for tomorrow at 2:20. Please advise on current situation.

## 2023-06-17 NOTE — Telephone Encounter (Signed)
Rescheduled appt with Dr. Laural Benes for today at 2:20

## 2023-06-18 ENCOUNTER — Ambulatory Visit: Payer: Medicare HMO | Admitting: Pediatrics

## 2023-06-27 DIAGNOSIS — I2510 Atherosclerotic heart disease of native coronary artery without angina pectoris: Secondary | ICD-10-CM | POA: Diagnosis not present

## 2023-06-27 DIAGNOSIS — R0602 Shortness of breath: Secondary | ICD-10-CM | POA: Diagnosis not present

## 2023-06-29 NOTE — Patient Instructions (Signed)
Be Involved in Caring For Your Health:  Taking Medications When medications are taken as directed, they can greatly improve your health. But if they are not taken as prescribed, they may not work. In some cases, not taking them correctly can be harmful. To help ensure your treatment remains effective and safe, understand your medications and how to take them. Bring your medications to each visit for review by your provider.  Your lab results, notes, and after visit summary will be available on My Chart. We strongly encourage you to use this feature. If lab results are abnormal the clinic will contact you with the appropriate steps. If the clinic does not contact you assume the results are satisfactory. You can always view your results on My Chart. If you have questions regarding your health or results, please contact the clinic during office hours. You can also ask questions on My Chart.  We at [facility] are grateful that you chose [place] to provide your care. We strive to provide evidence-based and compassionate care and are always looking for feedback. If you get a survey from the clinic please complete this so we can hear your opinions.

## 2023-07-01 ENCOUNTER — Ambulatory Visit: Payer: Medicare HMO | Admitting: Nurse Practitioner

## 2023-07-01 ENCOUNTER — Encounter: Payer: Self-pay | Admitting: Nurse Practitioner

## 2023-07-01 VITALS — BP 136/70 | HR 78 | Temp 98.4°F | Resp 16 | Ht 68.0 in | Wt 191.2 lb

## 2023-07-01 DIAGNOSIS — M545 Low back pain, unspecified: Secondary | ICD-10-CM | POA: Diagnosis not present

## 2023-07-01 NOTE — Assessment & Plan Note (Signed)
Acute and improved at this time after treatment.  Continue to monitor closely.

## 2023-07-01 NOTE — Progress Notes (Signed)
BP 136/70 (BP Location: Left Arm, Patient Position: Sitting, Cuff Size: Normal)   Pulse 78   Temp 98.4 F (36.9 C) (Oral)   Resp 16   Ht 5\' 8"  (1.727 m)   Wt 191 lb 3.2 oz (86.7 kg)   SpO2 96% Comment: room air  BMI 29.07 kg/m    Subjective:    Patient ID: Andrew Potts, male    DOB: [birth year], 73 y.o.   MRN: [id number]  HPI: Andrew Potts is a 72 y.o. male  Chief Complaint  Patient presents with   Back Pain   BACK PAIN Follow-up for back pain, which he was seen for on 06/17/23.  Was treated with steroid shot in office + Baclofen/Meloxicam.  He reports overall feeling better.  No more pain. Duration: weeks Mechanism of injury: no trauma Location: bilateral and low back Onset: sudden Severity: 0/10 Aggravating factors: none is improved Alleviating factors: treatment as above Status: better Treatments attempted: as above  Relief with NSAIDs?: moderate Nighttime pain:  no Paresthesias / decreased sensation:  no Bowel / bladder incontinence:  no Fevers:  no Dysuria / urinary frequency:  no      07/01/2023    2:05 PM 06/17/2023    2:11 PM 04/16/2023    1:22 PM 01/15/2023    9:02 AM 09/28/2022    1:29 PM  Depression screen PHQ 2/9  Decreased Interest 0 0 0 0 0  Down, Depressed, Hopeless 0 0 0 0 0  PHQ - 2 Score 0 0 0 0 0  Altered sleeping 0 0 0 0 0  Tired, decreased energy 0 0 0 0 0  Change in appetite 0 0 0 0 0  Feeling bad or failure about yourself  0 0 0 0 0  Trouble concentrating 0 0 0 0 0  Moving slowly or fidgety/restless 0 0 0 0 0  Suicidal thoughts 0 0 0 0 0  PHQ-9 Score 0 0 0 0 0  Difficult doing work/chores  Not difficult at all Not difficult at all Not difficult at all Not difficult at all       07/01/2023    2:05 PM 06/17/2023    2:11 PM 04/16/2023    1:23 PM 09/28/2022    1:29 PM  GAD 7 : Generalized Anxiety Score  Nervous, Anxious, on Edge 0 0 0 0  Control/stop worrying 0 0 0 0  Worry too much - different things 0 0 0 0  Trouble relaxing 0  0 0 0  Restless 0 0 0 0  Easily annoyed or irritable 0 0 0 0  Afraid - awful might happen 0 0 0 0  Total GAD 7 Score 0 0 0 0  Anxiety Difficulty  Not difficult at all Not difficult at all    Relevant past medical, surgical, family and social history reviewed and updated as indicated. Interim medical history since our last visit reviewed. Allergies and medications reviewed and updated.  Review of Systems  Constitutional:  Negative for activity change, diaphoresis, fatigue and fever.  Respiratory:  Negative for cough, chest tightness, shortness of breath and wheezing.   Cardiovascular:  Negative for chest pain, palpitations and leg swelling.  Gastrointestinal: Negative.   Musculoskeletal:  Negative for back pain.  Neurological: Negative.   Psychiatric/Behavioral: Negative.      Per HPI unless specifically indicated above     Objective:    BP 136/70 (BP Location: Left Arm, Patient Position: Sitting, Cuff Size: Normal)   Pulse 78  Temp 98.4 F (36.9 C) (Oral)   Resp 16   Ht 5\' 8"  (1.727 m)   Wt 191 lb 3.2 oz (86.7 kg)   SpO2 96% Comment: room air  BMI 29.07 kg/m   Wt Readings from Last 3 Encounters:  07/01/23 191 lb 3.2 oz (86.7 kg)  06/17/23 190 lb 6.4 oz (86.4 kg)  04/16/23 186 lb 3.2 oz (84.5 kg)    Physical Exam Vitals and nursing note reviewed.  Constitutional:      General: He is awake. He is not in acute distress.    Appearance: Normal appearance. He is well-developed and well-groomed. He is not ill-appearing or toxic-appearing.  HENT:     Head: Normocephalic.     Right Ear: Hearing and external ear normal.     Left Ear: Hearing and external ear normal.  Eyes:     General: Lids are normal.     Extraocular Movements: Extraocular movements intact.     Conjunctiva/sclera: Conjunctivae normal.  Neck:     Thyroid: No thyromegaly.     Vascular: No carotid bruit.  Cardiovascular:     Rate and Rhythm: Normal rate and regular rhythm.     Heart sounds: Normal heart  sounds. No murmur heard.    No gallop.  Pulmonary:     Effort: No accessory muscle usage or respiratory distress.     Breath sounds: Normal breath sounds.  Abdominal:     General: Bowel sounds are normal. There is no distension.     Palpations: Abdomen is soft.     Tenderness: There is no abdominal tenderness.  Musculoskeletal:     Cervical back: Full passive range of motion without pain.     Lumbar back: Normal.     Right lower leg: No edema.     Left lower leg: No edema.  Lymphadenopathy:     Cervical: No cervical adenopathy.  Skin:    General: Skin is warm.     Capillary Refill: Capillary refill takes less than 2 seconds.  Neurological:     Mental Status: He is alert and oriented to person, place, and time.     Deep Tendon Reflexes: Reflexes are normal and symmetric.     Reflex Scores:      Brachioradialis reflexes are 2+ on the right side and 2+ on the left side.      Patellar reflexes are 2+ on the right side and 2+ on the left side. Psychiatric:        Attention and Perception: Attention normal.        Mood and Affect: Mood normal.        Speech: Speech normal.        Behavior: Behavior normal. Behavior is cooperative.        Thought Content: Thought content normal.     Results for orders placed or performed in visit on 04/16/23  Microalbumin, Urine Waived   Collection Time: 04/16/23  1:30 PM  Result Value Ref Range   Microalb, Ur Waived 10 0 - 19 mg/L   Creatinine, Urine Waived 50 10 - 300 mg/dL   Microalb/Creat Ratio <30 <30 mg/g  Comprehensive metabolic panel   Collection Time: 04/16/23  1:31 PM  Result Value Ref Range   Glucose 71 70 - 99 mg/dL   BUN 10 8 - 27 mg/dL   Creatinine, Ser 1.61 0.76 - 1.27 mg/dL   eGFR 96 >09 UE/AVW/0.98   BUN/Creatinine Ratio 13 10 - 24   Sodium 139 134 -  144 mmol/L   Potassium 4.3 3.5 - 5.2 mmol/L   Chloride 100 96 - 106 mmol/L   CO2 25 20 - 29 mmol/L   Calcium 9.7 8.6 - 10.2 mg/dL   Total Protein 7.3 6.0 - 8.5 g/dL    Albumin 4.4 3.9 - 4.9 g/dL   Globulin, Total 2.9 1.5 - 4.5 g/dL   Bilirubin Total 1.4 (H) 0.0 - 1.2 mg/dL   Alkaline Phosphatase 100 44 - 121 IU/L   AST 17 0 - 40 IU/L   ALT 13 0 - 44 IU/L  CBC with Differential/Platelet   Collection Time: 04/16/23  1:31 PM  Result Value Ref Range   WBC 9.1 3.4 - 10.8 x10E3/uL   RBC 4.77 4.14 - 5.80 x10E6/uL   Hemoglobin 15.1 13.0 - 17.7 g/dL   Hematocrit 02.7 25.3 - 51.0 %   MCV 98 (H) 79 - 97 fL   MCH 31.7 26.6 - 33.0 pg   MCHC 32.3 31.5 - 35.7 g/dL   RDW 66.4 40.3 - 47.4 %   Platelets 323 150 - 450 x10E3/uL   Neutrophils 50 Not Estab. %   Lymphs 33 Not Estab. %   Monocytes 11 Not Estab. %   Eos 5 Not Estab. %   Basos 1 Not Estab. %   Neutrophils Absolute 4.6 1.4 - 7.0 x10E3/uL   Lymphocytes Absolute 3.0 0.7 - 3.1 x10E3/uL   Monocytes Absolute 1.0 (H) 0.1 - 0.9 x10E3/uL   EOS (ABSOLUTE) 0.5 (H) 0.0 - 0.4 x10E3/uL   Basophils Absolute 0.1 0.0 - 0.2 x10E3/uL   Immature Granulocytes 0 Not Estab. %   Immature Grans (Abs) 0.0 0.0 - 0.1 x10E3/uL  Lipid Panel w/o Chol/HDL Ratio   Collection Time: 04/16/23  1:31 PM  Result Value Ref Range   Cholesterol, Total 137 100 - 199 mg/dL   Triglycerides 259 (H) 0 - 149 mg/dL   HDL 38 (L) >56 mg/dL   VLDL Cholesterol Cal 28 5 - 40 mg/dL   LDL Chol Calc (NIH) 71 0 - 99 mg/dL  TSH   Collection Time: 04/16/23  1:31 PM  Result Value Ref Range   TSH 0.795 0.450 - 4.500 uIU/mL  Magnesium   Collection Time: 04/16/23  1:31 PM  Result Value Ref Range   Magnesium 2.1 1.6 - 2.3 mg/dL  PSA   Collection Time: 04/16/23  1:31 PM  Result Value Ref Range   Prostate Specific Ag, Serum 1.8 0.0 - 4.0 ng/mL      Assessment & Plan:   Problem List Items Addressed This Visit       Other   Low back pain - Primary   Acute and improved at this time after treatment.  Continue to monitor closely.        Follow up plan: Return for as scheduled on April 1st.

## 2023-07-05 ENCOUNTER — Ambulatory Visit
Admission: RE | Admit: 2023-07-05 | Discharge: 2023-07-05 | Disposition: A | Discharge status: Home / Self Care | Payer: Medicare HMO | Attending: Pediatrics | Admitting: Pediatrics

## 2023-07-05 ENCOUNTER — Ambulatory Visit
Admission: RE | Admit: 2023-07-05 | Discharge: 2023-07-05 | Disposition: A | Discharge status: Home / Self Care | Payer: Medicare HMO | Source: Ambulatory Visit | Attending: Pediatrics | Admitting: Pediatrics

## 2023-07-05 ENCOUNTER — Ambulatory Visit (INDEPENDENT_AMBULATORY_CARE_PROVIDER_SITE_OTHER): Payer: Medicare HMO | Admitting: Pediatrics

## 2023-07-05 VITALS — BP 148/73 | HR 86 | Ht 68.0 in | Wt 187.8 lb

## 2023-07-05 DIAGNOSIS — M25552 Pain in left hip: Secondary | ICD-10-CM

## 2023-07-05 DIAGNOSIS — I10 Essential (primary) hypertension: Secondary | ICD-10-CM

## 2023-07-05 DIAGNOSIS — Z1330 Encounter for screening examination for mental health and behavioral disorders, unspecified: Secondary | ICD-10-CM | POA: Diagnosis not present

## 2023-07-05 MED ORDER — MELOXICAM 15 MG PO TABS
15.0000 mg | ORAL_TABLET | Freq: Every day | ORAL | 0 refills | Status: DC
Start: 2023-07-05 — End: 2023-10-04

## 2023-07-05 MED ORDER — KETOROLAC TROMETHAMINE 60 MG/2ML IM SOLN
60.0000 mg | Freq: Once | INTRAMUSCULAR | Status: AC
  Administered 2023-07-05: 60 mg via INTRAMUSCULAR

## 2023-07-05 NOTE — Progress Notes (Unsigned)
Office Visit  BP (!) 148/73 (BP Location: Left Arm, Patient Position: Sitting, Cuff Size: Normal)   Pulse 86   Ht 5\' 8"  (1.727 m)   Wt 187 lb 12.8 oz (85.2 kg)   SpO2 94%   BMI 28.55 kg/m    Subjective:    Patient ID: Andrew Potts, male    DOB: July 01, 1951, 72 y.o.   MRN: [id number]  HPI: Andrew Potts is a 72 y.o. male  Chief Complaint  Patient presents with   potential pinched nerve    Left leg and hip, has been painful since Tuesday. Hasn't slept since Tuesday, pain is triggered when walking, sitting and standing, pain varies in intensity    Discussed the use of AI scribe software for clinical note transcription with the patient, who gave verbal consent to proceed.  History of Present Illness            Relevant past medical, surgical, family and social history reviewed and updated as indicated. Interim medical history since our last visit reviewed. Allergies and medications reviewed and updated.  ROS per HPI unless specifically indicated above     Objective:    BP (!) 148/73 (BP Location: Left Arm, Patient Position: Sitting, Cuff Size: Normal)   Pulse 86   Ht 5\' 8"  (1.727 m)   Wt 187 lb 12.8 oz (85.2 kg)   SpO2 94%   BMI 28.55 kg/m   Wt Readings from Last 3 Encounters:  07/05/23 187 lb 12.8 oz (85.2 kg)  07/01/23 191 lb 3.2 oz (86.7 kg)  06/17/23 190 lb 6.4 oz (86.4 kg)     Physical Exam      07/01/2023    2:05 PM 06/17/2023    2:11 PM 04/16/2023    1:22 PM 01/15/2023    9:02 AM 09/28/2022    1:29 PM  Depression screen PHQ 2/9  Decreased Interest 0 0 0 0 0  Down, Depressed, Hopeless 0 0 0 0 0  PHQ - 2 Score 0 0 0 0 0  Altered sleeping 0 0 0 0 0  Tired, decreased energy 0 0 0 0 0  Change in appetite 0 0 0 0 0  Feeling bad or failure about yourself  0 0 0 0 0  Trouble concentrating 0 0 0 0 0  Moving slowly or fidgety/restless 0 0 0 0 0  Suicidal thoughts 0 0 0 0 0  PHQ-9 Score 0 0 0 0 0  Difficult doing work/chores  Not difficult at all Not  difficult at all Not difficult at all Not difficult at all       07/01/2023    2:05 PM 06/17/2023    2:11 PM 04/16/2023    1:23 PM 09/28/2022    1:29 PM  GAD 7 : Generalized Anxiety Score  Nervous, Anxious, on Edge 0 0 0 0  Control/stop worrying 0 0 0 0  Worry too much - different things 0 0 0 0  Trouble relaxing 0 0 0 0  Restless 0 0 0 0  Easily annoyed or irritable 0 0 0 0  Afraid - awful might happen 0 0 0 0  Total GAD 7 Score 0 0 0 0  Anxiety Difficulty  Not difficult at all Not difficult at all        Assessment & Plan:  Assessment & Plan   Left hip pain -     Meloxicam; Take 1 tablet (15 mg total) by mouth daily.  Dispense: 30 tablet; Refill:  0 -     DG HIP UNILAT W OR W/O PELVIS 2-3 VIEWS LEFT; Future -     Ketorolac Tromethamine     Assessment and Plan              Follow up plan: Return in about 1 week (around 07/12/2023) for hip pain.  Obera Stauch Howell Pringle, MD

## 2023-07-05 NOTE — Patient Instructions (Signed)
 [facility] [address] [place],  [place]  [address]

## 2023-07-06 ENCOUNTER — Encounter: Payer: Self-pay | Admitting: Pediatrics

## 2023-07-08 ENCOUNTER — Ambulatory Visit: Payer: Self-pay

## 2023-07-08 DIAGNOSIS — M5416 Radiculopathy, lumbar region: Secondary | ICD-10-CM | POA: Insufficient documentation

## 2023-07-08 DIAGNOSIS — M25552 Pain in left hip: Secondary | ICD-10-CM | POA: Diagnosis not present

## 2023-07-08 DIAGNOSIS — M4726 Other spondylosis with radiculopathy, lumbar region: Secondary | ICD-10-CM | POA: Diagnosis not present

## 2023-07-08 DIAGNOSIS — M533 Sacrococcygeal disorders, not elsewhere classified: Secondary | ICD-10-CM | POA: Diagnosis not present

## 2023-07-08 NOTE — Telephone Encounter (Signed)
  Chief Complaint: left hip and shooting pain down leg Symptoms: severe pain  Frequency: chronic Pertinent Negatives: Patient denies weakness or tingling  Disposition: [] ED /[x] Urgent Care (no appt availability in office) / [] Appointment(In office/virtual)/ []  [facility]/ [] Home Care/ [] Refused Recommended Disposition /[] [facility]/ []  Follow-up with PCP Additional Notes: advised to go to [facility] for care. No appts in office -or at any [facility].  Reason for Disposition  [1] SEVERE pain (e.g., excruciating, unable to do any normal activities) AND [2] not improved after 2 hours of pain medicine  Answer Assessment - Initial Assessment Questions 1. LOCATION and RADIATION: "Where is the pain located?"      Left hip  all the way down left leg  3. SEVERITY: "How bad is the pain?" "What does it keep you from doing?"   (Scale 1-10; or mild, moderate, severe)   -  MILD (1-3): doesn't interfere with normal activities    -  MODERATE (4-7): interferes with normal activities (e.g., work or school) or awakens from sleep, limping    -  SEVERE (8-10): excruciating pain, unable to do any normal activities, unable to walk     20/10 4. ONSET: "When did the pain start?" "Does it come and go, or is it there all the time?"     chronic  8. OTHER SYMPTOMS: "Do you have any other symptoms?" (e.g., back pain, pain shooting down leg,  fever, rash)     Not slept in a week, pain shooting down leg  Protocols used: Hip Pain-A-AH

## 2023-07-19 DIAGNOSIS — M5416 Radiculopathy, lumbar region: Secondary | ICD-10-CM | POA: Diagnosis not present

## 2023-07-24 ENCOUNTER — Encounter: Payer: Medicare HMO | Admitting: Pediatrics

## 2023-07-24 DIAGNOSIS — I10 Essential (primary) hypertension: Secondary | ICD-10-CM

## 2023-07-24 DIAGNOSIS — Z1330 Encounter for screening examination for mental health and behavioral disorders, unspecified: Secondary | ICD-10-CM

## 2023-07-24 DIAGNOSIS — M25552 Pain in left hip: Secondary | ICD-10-CM

## 2023-07-24 NOTE — Progress Notes (Deleted)
   Office Visit  There were no vitals taken for this visit.   Subjective:    Patient ID: ZYRON DEELEY, male    DOB: 1950/07/20, 73 y.o.   MRN: [id number]  HPI: HUNT ZAJICEK is a 73 y.o. male  No chief complaint on file.   Discussed the use of AI scribe software for clinical note transcription with the patient, who gave verbal consent to proceed.  History of Present Illness            Relevant past medical, surgical, family and social history reviewed and updated as indicated. Interim medical history since our last visit reviewed. Allergies and medications reviewed and updated.  ROS per HPI unless specifically indicated above     Objective:    There were no vitals taken for this visit.  Wt Readings from Last 3 Encounters:  07/05/23 187 lb 12.8 oz (85.2 kg)  07/01/23 191 lb 3.2 oz (86.7 kg)  06/17/23 190 lb 6.4 oz (86.4 kg)     Physical Exam      07/01/2023    2:05 PM 06/17/2023    2:11 PM 04/16/2023    1:22 PM 01/15/2023    9:02 AM 09/28/2022    1:29 PM  Depression screen PHQ 2/9  Decreased Interest 0 0 0 0 0  Down, Depressed, Hopeless 0 0 0 0 0  PHQ - 2 Score 0 0 0 0 0  Altered sleeping 0 0 0 0 0  Tired, decreased energy 0 0 0 0 0  Change in appetite 0 0 0 0 0  Feeling bad or failure about yourself  0 0 0 0 0  Trouble concentrating 0 0 0 0 0  Moving slowly or fidgety/restless 0 0 0 0 0  Suicidal thoughts 0 0 0 0 0  PHQ-9 Score 0 0 0 0 0  Difficult doing work/chores  Not difficult at all Not difficult at all Not difficult at all Not difficult at all       07/01/2023    2:05 PM 06/17/2023    2:11 PM 04/16/2023    1:23 PM 09/28/2022    1:29 PM  GAD 7 : Generalized Anxiety Score  Nervous, Anxious, on Edge 0 0 0 0  Control/stop worrying 0 0 0 0  Worry too much - different things 0 0 0 0  Trouble relaxing 0 0 0 0  Restless 0 0 0 0  Easily annoyed or irritable 0 0 0 0  Afraid - awful might happen 0 0 0 0  Total GAD 7 Score 0 0 0 0  Anxiety Difficulty   Not difficult at all Not difficult at all        Assessment & Plan:  Assessment & Plan   Encounter for behavioral health screening  Left hip pain  Essential hypertension     Assessment and Plan              Follow up plan: No follow-ups on file.  Jameis Newsham Howell Pringle, MD

## 2023-07-25 DIAGNOSIS — M5416 Radiculopathy, lumbar region: Secondary | ICD-10-CM | POA: Diagnosis not present

## 2023-07-26 DIAGNOSIS — M5416 Radiculopathy, lumbar region: Secondary | ICD-10-CM | POA: Diagnosis not present

## 2023-07-30 ENCOUNTER — Ambulatory Visit: Payer: Self-pay | Admitting: *Deleted

## 2023-07-30 NOTE — Patient Outreach (Signed)
 Care Coordination   Follow Up Visit Note   07/30/2023 Name: ABDULAHI SCHOR MRN: [id number] DOB: [birth year]  Garen LITTIE Brosious is a 73 y.o. year old male who sees [name], Melanie T, NP for primary care. I spoke with  Lorrie L Lahm by phone today.  What matters to the patients health and wellness today?  Patient report he has been doing well as far as chronic medical conditions, but has been having issues with back pain since mid December.  He is active with ortho, but working to improve pain more so he does not have to get injections or surgery.      Goals Addressed             This Visit's Progress    COMPLETED: RNCM Care Management  Expected Outcome:  Monitor, Self-Manage, and Reduce Symptoms of Hypertension       Current Barriers:  Knowledge Deficits related to normalized blood pressures and the need to maintain systolic blood pressures of <150 and diastolic <90 Care Coordination needs related to the proper use of blood pressure cuff in a patient with HTN Chronic Disease Management support and education needs related to effective management of HTN  BP Readings from Last 3 Encounters:  07/05/23 (!) 148/73  07/01/23 136/70  06/17/23 122/71    Planned Interventions: Evaluation of current treatment plan related to hypertension self management and patient's adherence to plan as established by provider. The patient states that he is doing well.  Provided education to patient re: stroke prevention, s/s of heart attack and stroke.   Reviewed prescribed diet heart healthy diet. Review and education provided. Reviewed medications with patient and discussed importance of compliance. Discussed plans with patient for ongoing care management follow up and provided patient with direct contact information for care management team; Advised patient, providing education and rationale, to monitor blood pressure daily and record, calling PCP for findings outside established parameters. Education on  the goal of systolic blood pressures of <150 and diastolic pressures of <90 Advised patient to discuss changes in blood pressures, and review of his home monitor in the office compared to the office monitor with provider; Provided education on prescribed diet heart healthy;  Discussed complications of poorly controlled blood pressure such as heart disease, stroke, circulatory complications, vision complications, kidney impairment, sexual dysfunction;  Screening for signs and symptoms of depression related to chronic disease state;    Symptom Management: Take medications as prescribed   Attend all scheduled provider appointments Call provider office for new concerns or questions  call the Suicide and Crisis Lifeline: 988 call the [place]  National Suicide Prevention Lifeline: 615-430-0594 or TTY: 614 016 5280 TTY 260 256 5701) to talk to a trained counselor call 1-800-273-TALK (toll free, 24 hour hotline) if experiencing a Mental Health or Behavioral Health Crisis  check blood pressure 3 times per week write blood pressure results in a log or diary learn about high blood pressure keep a blood pressure log take blood pressure log to all doctor appointments call doctor for signs and symptoms of high blood pressure develop an action plan for high blood pressure keep all doctor appointments take medications for blood pressure exactly as prescribed report new symptoms to your doctor         COMPLETED: RNCM Care Management Expected Outcome:  Monitor, Self-Manage and Reduce Symptoms of Heart Failure       Current Barriers:  Chronic Disease Management support and education needs related to effective management of HF  Wt Readings from Last 3  Encounters:  07/05/23 187 lb 12.8 oz (85.2 kg)  07/01/23 191 lb 3.2 oz (86.7 kg)  06/17/23 190 lb 6.4 oz (86.4 kg)    Planned Interventions: Basic overview and discussion of pathophysiology of Heart Failure reviewed. The patient has a good  understanding of his heart failure and knows changes to watch for. His weight is stable and he denies any acute changes in his HF or heart health.  Provided education on low sodium diet. Review and education provided.  Assessed need for readable accurate scales in home.  Provided education about placing scale on hard, flat surface Advised patient to weigh each morning after emptying bladder Discussed importance of daily weight and advised patient to weigh and record daily Provided patient with education about the role of exercise in the management of heart failure Advised patient to discuss changes in weight, changes in swelling/edema in feet/legs/abdomen, new questions or concerns about heart failure or heart health with provider Screening for signs and symptoms of depression related to chronic disease state   Symptom Management: Take medications as prescribed   Attend all scheduled provider appointments Call provider office for new concerns or questions  call the Suicide and Crisis Lifeline: 988 call the [place]  National Suicide Prevention Lifeline: 619-624-3647 or TTY: (270)427-1701 TTY 2810610168) to talk to a trained counselor call 1-800-273-TALK (toll free, 24 hour hotline) if experiencing a Mental Health or Behavioral Health Crisis  call office if I gain more than 2 pounds in one day or 5 pounds in one week track weight in diary use salt in moderation watch for swelling in feet, ankles and legs every day weigh myself daily bring diary to all appointments develop a rescue plan follow rescue plan if symptoms flare-up know when to call the doctorfor changes in heart failure, or acute weight changes track symptoms and what helps feel better or worse dress right for the weather, hot or cold        COMPLETED: RNCM Care Management:  Maintain, Monitor and Self-Manage Symptoms of COPD       Current Barriers:  Care Coordination needs related to supply of Symbicort  and paperwork that  the patient has to get Anoro approved in a patient with COPD Chronic Disease Management support and education needs related to effective management of COPD  Planned Interventions: Provided patient with basic written and verbal COPD education on self care/management/and exacerbation prevention. The patient is stable. States he has to be careful with all the weather changes. Denies any new concerns related to his breathing.  Advised patient to track and manage COPD triggers. Review of triggers and the patient knows how to manage changes.  Provided written and verbal instructions on pursed lip breathing and utilized returned demonstration as teach back Provided instruction about proper use of medications used for management of COPD including inhalers. Education and support given.  Advised patient to self assesses COPD action plan zone and make appointment with provider if in the yellow zone for 48 hours without improvement Advised patient to engage in light exercise as tolerated 3-5 days a week to aid in the the management of COPD. He continues to exercise and walking is what he does. Does not want to gain a lot of weight Provided education about and advised patient to utilize infection prevention strategies to reduce risk of respiratory infection. Review and education provided. The patient is doing well and staying in especially with weather changes. Discussed the importance of adequate rest and management of fatigue with COPD Screening for  signs and symptoms of depression related to chronic disease state   Symptom Management: Take medications as prescribed   Attend all scheduled provider appointments Call provider office for new concerns or questions  call the Suicide and Crisis Lifeline: 988 call the [place]  National Suicide Prevention Lifeline: (224) 285-3172 or TTY: 6460450883 TTY 920-679-0621) to talk to a trained counselor call 1-800-273-TALK (toll free, 24 hour hotline) if experiencing a  Mental Health or Behavioral Health Crisis  eliminate smoking in my home identify and remove indoor air pollutants limit outdoor activity during cold weather listen for public air quality announcements every day do breathing exercises every day develop a rescue plan eliminate symptom triggers at home follow rescue plan if symptoms flare-up            SDOH assessments and interventions completed:  No     Care Coordination Interventions:  Yes, provided   Interventions Today    Flowsheet Row Most Recent Value  Chronic Disease   Chronic disease during today's visit Chronic Obstructive Pulmonary Disease (COPD), Congestive Heart Failure (CHF), Hypertension (HTN), Other  [chronic back pain]  General Interventions   General Interventions Discussed/Reviewed General Interventions Reviewed, Doctor Visits, Communication with, Durable Medical Equipment (DME)  rolanda back pain since mid December, has had x-rays and MRI as well as now active with ortho specialist.  Received Toradol  injections and prednisone, pain is more manageable at this time]  Doctor Visits Discussed/Reviewed Doctor Visits Reviewed, Specialist, PCP  [facility] in April, he will continue to see ortho as needed.  Has home visit from insurance company nurse on 1/16]  Durable Medical Equipment (DME) BP Cuff, Other  [Scale, monitoring blood pressure and weights daily]  PCP/Specialist Visits Compliance with follow-up visit  [seen recently as 1/10 by ortho specialist]  Communication with PCP/Specialists  [Reached out to PCP per pt request to confirm he is able to take either Aleve or Ibuprofen  with Aspirin ]  Exercise Interventions   Exercise Discussed/Reviewed Physical Activity  Physical Activity Discussed/Reviewed Physical Activity Reviewed  [Discussed how sometimes increasing activity can help decrease pain and stiffness, state he is trying to remain active, shoveled snow over the past weekend]  Education Interventions   Education  Provided Provided Education  Provided Verbal Education On When to see the doctor, Medication, Other  [Tramadol for pain management, does not want to take this often. Prefers to take anti-inflammatory such as Ibuprofen  but want to make sure that's ok. RNCM collaborating with PCP on approval. Has one more does or Meloxicam  he will take, predinose completed]       Follow up plan: Follow up call scheduled for 1/29    Encounter Outcome:  Patient Visit Completed   Odella Ku, RN, MSN, CCM [place]  [facility], [facility] Health RN Care Coordinator Direct Dial: [phone] / Main [phone] Fax [phone] Email: [email or web address]

## 2023-07-30 NOTE — Patient Instructions (Signed)
 Visit Information  Thank you for taking time to visit with me today. Please don't hesitate to contact me if I can be of assistance to you before our next scheduled telephone appointment.  Our next appointment is by telephone on 12/9  Please call the care guide team at [phone] if you need to cancel or reschedule your appointment.   Please call the Suicide and Crisis Lifeline: 988 call the [place] National Suicide Prevention Lifeline: 414-853-2802 or TTY: (978)544-0595 TTY 450-239-5310) to talk to a trained counselor call 1-800-273-TALK (toll free, 24 hour hotline) call 911 if you are experiencing a Mental Health or Behavioral Health Crisis or need someone to talk to.  Patient verbalizes understanding of instructions and care plan provided today and agrees to view in MyChart. Active MyChart status and patient understanding of how to access instructions and care plan via MyChart confirmed with patient.     The patient has been provided with contact information for the care management team and has been advised to call with any health related questions or concerns.   Rodney Langton, RN, MSN, CCM [facility], [facility] Health RN Care Coordinator Direct Dial: [phone] / Main [phone] Fax [phone] Email: [email or web address]: [email or web address]

## 2023-08-01 ENCOUNTER — Telehealth: Payer: Self-pay | Admitting: *Deleted

## 2023-08-01 DIAGNOSIS — Z008 Encounter for other general examination: Secondary | ICD-10-CM | POA: Diagnosis not present

## 2023-08-01 NOTE — Patient Outreach (Signed)
  Care Coordination   Follow Up Visit Note   08/01/2023 Name: TZION MCADEN MRN: [id number] DOB: [birth year]  Benny Lennert Shawn is a 73 y.o. year old male who sees [place], Corrie Dandy T, NP for primary care. I spoke with  Uzoma L Forge by phone today.  What matters to the patients health and wellness today?  Patient report back pain is getting better, state he was contacted by ortho and advised that insurance has approved for him to get in injection.  He does not want this at this time, will start with taking Baclofen as prescribed.     Goals Addressed             This Visit's Progress    Management of chronic medical conditions       Interventions Today    Flowsheet Row Most Recent Value  Chronic Disease   Chronic disease during today's visit Chronic Obstructive Pulmonary Disease (COPD), Congestive Heart Failure (CHF), Other  [chronic back pain]  General Interventions   General Interventions Discussed/Reviewed Doctor Visits, General Interventions Reviewed  Doctor Visits Discussed/Reviewed Doctor Visits Reviewed  Education Interventions   Education Provided Provided Education  Provided Verbal Education On Medication  [Advised not to take Ibuprofen per PCP, may take Tylenol and use icy hot or voltaren gel.  STate he has Baclofen, will use that]              SDOH assessments and interventions completed:  No     Care Coordination Interventions:  Yes, provided   Follow up plan: Follow up call scheduled for 1/29    Encounter Outcome:  Patient Visit Completed   Rodney Langton, RN, MSN, CCM [place]  [facility], [facility] Health RN Care Coordinator Direct Dial: [phone] / Main [phone] Fax [phone] Email: [email or web address] Website: [email or web address]

## 2023-08-14 ENCOUNTER — Ambulatory Visit: Payer: Self-pay | Admitting: *Deleted

## 2023-08-14 NOTE — Patient Outreach (Signed)
  Care Coordination   Follow Up Visit Note   08/14/2023 Name: Andrew Potts MRN: [id number] DOB: [birth year]  Andrew Potts is a 73 y.o. year old male who sees [place], Corrie Dandy T, NP for primary care. I spoke with  Andrew Potts by phone today.  What matters to the patients health and wellness today?  Report pain is much better, decreased from 10/10 to 2/10 today.  He does voice some concern about being told by his visiting insurance nurse that he had PAD and will need to follow up with his PCP.   He does not have documented history of this, advised to discuss with provider.    Goals Addressed             This Visit's Progress    Management of chronic medical conditions   On track    Interventions Today    Flowsheet Row Most Recent Value  Chronic Disease   Chronic disease during today's visit Chronic Obstructive Pulmonary Disease (COPD), Other  [chronic pain]  General Interventions   General Interventions Discussed/Reviewed General Interventions Reviewed, Doctor Visits  [Report back/sciatic pain is better, rating 2/10 today]  Doctor Visits Discussed/Reviewed Doctor Visits Reviewed, PCP  [Upcoming reviewed: PCP on 4/1]  PCP/Specialist Visits Compliance with follow-up visit  [Report he had home visit from RN with insurance company, was told he had some PAD and to follow up with his PCP]  Education Interventions   Education Provided Provided Education  Provided Verbal Education On Medication, When to see the doctor  [Medications reviewed, state he was NOT taking Baclofen but instead Meloxicam for pain relief, which has helped. Advised to call provider if he has increase in pain or discomfort]              SDOH assessments and interventions completed:  No     Care Coordination Interventions:  Yes, provided   Follow up plan: Follow up call scheduled for 4/8    Encounter Outcome:  Patient Visit Completed   Rodney Langton, RN, MSN, CCM [place]  [facility], [facility] Health RN Care Coordinator Direct Dial: [phone] / Main [phone] Fax [phone] Email: [email or web address]: [email or web address]

## 2023-08-14 NOTE — Patient Instructions (Signed)
Visit Information  Thank you for taking time to visit with me today. Please don't hesitate to contact me if I can be of assistance to you before our next scheduled telephone appointment.  Following are the goals we discussed today:  Call PCP office if you are concerned about outcome of [employer] home nurse visit.   Our next appointment is by telephone on 4/8  Please call the care guide team at [phone] if you need to cancel or reschedule your appointment.   Please call the Suicide and Crisis Lifeline: 988 call the [place] National Suicide Prevention Lifeline: 308-739-6653 or TTY: 431-334-0662 TTY 8458187260) to talk to a trained counselor call 1-800-273-TALK (toll free, 24 hour hotline) call 911 if you are experiencing a Mental Health or Behavioral Health Crisis or need someone to talk to.  Patient verbalizes understanding of instructions and care plan provided today and agrees to view in MyChart. Active MyChart status and patient understanding of how to access instructions and care plan via MyChart confirmed with patient.     The patient has been provided with contact information for the care management team and has been advised to call with any health related questions or concerns.   Rodney Langton, RN, MSN, CCM [facility], [facility] Health RN Care Coordinator Direct Dial: [phone] / Main [phone] Fax [phone] Email: [email or web address]: [email or web address]

## 2023-09-25 ENCOUNTER — Encounter: Payer: Self-pay | Admitting: Nurse Practitioner

## 2023-09-29 NOTE — Patient Instructions (Signed)
 Be Involved in Caring For Your Health:  Taking Medications When medications are taken as directed, they can greatly improve your health. But if they are not taken as prescribed, they may not work. In some cases, not taking them correctly can be harmful. To help ensure your treatment remains effective and safe, understand your medications and how to take them. Bring your medications to each visit for review by your provider.  Your lab results, notes, and after visit summary will be available on My Chart. We strongly encourage you to use this feature. If lab results are abnormal the clinic will contact you with the appropriate steps. If the clinic does not contact you assume the results are satisfactory. You can always view your results on My Chart. If you have questions regarding your health or results, please contact the clinic during office hours. You can also ask questions on My Chart.  We at [facility] are grateful that you chose [place] to provide your care. We strive to provide evidence-based and compassionate care and are always looking for feedback. If you get a survey from the clinic please complete this so we can hear your opinions.  Eating Plan for Chronic Obstructive Pulmonary Disease Chronic obstructive pulmonary disease (COPD) causes symptoms such as shortness of breath, coughing, and chest discomfort. These symptoms can make it difficult to eat enough to maintain a healthy weight. Generally, people with COPD should eat a diet that is high in calories, protein, and other nutrients to maintain body weight and to keep the lungs as healthy as possible. Depending on the medicines you take and other health conditions you may have, your health care provider may give you additional recommendations on what to eat or avoid. Talk with your health care provider about your goals for body weight, and work with a dietitian to develop an eating plan that is right for you. What are tips for  following this plan? Reading food labels  Avoid foods with more than 300 milligrams (mg) of salt (sodium) per serving. Choose foods that contain at least 4 grams (g) of fiber per serving. Try to eat 20-30 g of fiber each day. Choose foods that are high in calories and protein, such as nuts, beans, yogurt, and cheese. Shopping Do not buy foods labeled as diet, low-calorie, or low-fat. If you are able to eat dairy products: Avoid low-fat or skim milk. Buy dairy products that have at least 2% fat. Buy nutritional supplement drinks. Buy grains and prepared foods labeled as enriched or fortified. Consider buying low-sodium, pre-made foods to conserve energy for eating. Cooking Add dry milk or protein powder to smoothies. Cook with healthy fats, such as olive oil, canola oil, sunflower oil, and grapeseed oil. Add oil, butter, cream cheese, or nut butters to foods to increase fat and calories. To make foods easier to chew and swallow: Cook vegetables, pasta, and rice until soft. Cut or grind meat into very small pieces. Dip breads in liquid. Meal planning  Eat when you feel hungry. Eat 5-6 small meals throughout the day. Drink 6-8 glasses of water each day. Do not drink liquids with meals. Drink liquids at the end of the meal to avoid feeling full too quickly. Eat a variety of fruits and vegetables every day. Ask for assistance from family or friends with planning and preparing meals as needed. Avoid foods that cause you to feel bloated, such as carbonated drinks, fried foods, beans, broccoli, cabbage, and apples. For older adults, ask your local  agency on aging whether you are eligible for meal assistance programs, such as Meals on Wheels. Lifestyle  Do not smoke. Eat slowly. Take small bites and chew food well before swallowing. Do not overeat. This may make it more difficult to breathe after eating. Sit up while eating. If needed, continue to use supplemental oxygen while  eating. Rest or relax for 30 minutes before and after eating. Monitor your weight as told by your health care provider. Exercise as told by your health care provider. What foods should I eat? Fruits All fresh, dried, canned, or frozen fruits that do not cause gas. Vegetables All fresh, canned (no salt added), or frozen vegetables that do not cause gas. Grains Whole-grain bread. Enriched whole-grain pasta. Fortified whole-grain cereals. Fortified rice. Quinoa. Meats and other proteins Lean meat. Poultry. Fish. Dried beans. Unsalted nuts. Tofu. Eggs. Nut butters. Dairy Whole or 2% milk. Cheese. Yogurt. Fats and oils Olive oil. Canola oil. Butter. Margarine. Beverages Water. Vegetable juice (no salt added). Decaffeinated coffee. Decaffeinated or herbal tea. Seasonings and condiments Fresh or dried herbs. Low-salt or salt-free seasonings. Low-sodium soy sauce. The items listed above may not be a complete list of foods and beverages you can eat. Contact a dietitian for more information. What foods should I avoid? Fruits Fruits that cause gas, such as apples or melon. Vegetables Vegetables that cause gas, such as broccoli, Brussels sprouts, cabbage, cauliflower, and onions. Canned vegetables with added salt. Meats and other proteins Fried meat. Salt-cured meat. Processed meat. Dairy Fat-free or low-fat milk, yogurt, or cheese. Processed cheese. Beverages Carbonated drinks. Caffeinated drinks, such as coffee, tea, and soft drinks. Juice. Alcohol. Vegetable juice with added salt. Seasonings and condiments Salt. Seasoning mixes with salt. Soy sauce. Rosita Fire. Other foods Clear soup or broth. Fried foods. Prepared frozen meals. The items listed above may not be a complete list of foods and beverages you should avoid. Contact a dietitian for more information. Summary COPD symptoms can make it difficult to eat enough to maintain a healthy weight. A COPD eating plan can help you maintain  your body weight and keep your lungs as healthy as possible. Eat a diet that is high in calories, protein, and other nutrients. Read labels to make sure that you are getting the right nutrients. Cook foods to make them easier to chew and swallow. Eat 5-6 small meals throughout the day, and avoid foods that cause gas or make you feel bloated. This information is not intended to replace advice given to you by your health care provider. Make sure you discuss any questions you have with your health care provider. Document Revised: 05/10/2023 Document Reviewed: 05/10/2023 Elsevier Patient Education  2024 [employer].

## 2023-10-04 ENCOUNTER — Encounter: Payer: Self-pay | Admitting: Nurse Practitioner

## 2023-10-04 ENCOUNTER — Ambulatory Visit: Admitting: Nurse Practitioner

## 2023-10-04 VITALS — BP 115/67 | HR 86 | Temp 98.0°F | Ht 68.0 in | Wt 191.2 lb

## 2023-10-04 DIAGNOSIS — I700 Atherosclerosis of aorta: Secondary | ICD-10-CM

## 2023-10-04 DIAGNOSIS — K732 Chronic active hepatitis, not elsewhere classified: Secondary | ICD-10-CM | POA: Diagnosis not present

## 2023-10-04 DIAGNOSIS — M25552 Pain in left hip: Secondary | ICD-10-CM | POA: Diagnosis not present

## 2023-10-04 DIAGNOSIS — I2089 Other forms of angina pectoris: Secondary | ICD-10-CM | POA: Diagnosis not present

## 2023-10-04 DIAGNOSIS — I252 Old myocardial infarction: Secondary | ICD-10-CM

## 2023-10-04 DIAGNOSIS — D692 Other nonthrombocytopenic purpura: Secondary | ICD-10-CM

## 2023-10-04 DIAGNOSIS — J410 Simple chronic bronchitis: Secondary | ICD-10-CM

## 2023-10-04 DIAGNOSIS — I5022 Chronic systolic (congestive) heart failure: Secondary | ICD-10-CM | POA: Diagnosis not present

## 2023-10-04 DIAGNOSIS — I10 Essential (primary) hypertension: Secondary | ICD-10-CM | POA: Diagnosis not present

## 2023-10-04 DIAGNOSIS — E782 Mixed hyperlipidemia: Secondary | ICD-10-CM | POA: Diagnosis not present

## 2023-10-04 MED ORDER — MELOXICAM 15 MG PO TABS: 15.0000 mg | ORAL_TABLET | Freq: Every day | ORAL | 0 refills | Status: DC | PRN

## 2023-10-04 NOTE — Assessment & Plan Note (Addendum)
 Chronic, stable.  BP well at goal today.  Recommend he monitor BP at home three mornings a week and document for provider, educated him on this.  Continue diet and exercise focus, DASH diet.  Labs: CMP.  Urine ALB 10 (October 2024), recheck at physical.  Continue current medication regimen and collaboration with cardiology as needed.  Praised for ongoing adherence to regimen.

## 2023-10-04 NOTE — Assessment & Plan Note (Addendum)
 Chronic, ongoing.  Spirometry FEV1/FVC 90% and FEV1 76% in August 2020 - will repeat at physical.   Recommend continued cessation smoking.  Continue current medication regimen, Anoro offering him benefit with minimal rescue use.  He refuses Lung CA Screening CT at this time due to current medical bills, recommended he obtain this.

## 2023-10-04 NOTE — Assessment & Plan Note (Signed)
 Chronic, ongoing.  Continue current medication regimen and adjust as needed. Lipid panel today.

## 2023-10-04 NOTE — Assessment & Plan Note (Signed)
Since STEMI 07/28/20.  Euvolemic.  At this time continue current medication regimen and collaboration with cardiology.  Recommend: - Reminded to call for an overnight weight gain of >2 pounds or a weekly weight gain of >5 pounds - not adding salt to food and read food labels. Reviewed the importance of keeping daily sodium intake to 2000mg  daily.  Highly recommend he cut back on sodium intake as suspect this is causing some edema. - Avoid Ibuprofen products.

## 2023-10-04 NOTE — Assessment & Plan Note (Signed)
Ongoing with STEMI 07/28/20, at this time will continue collaboration with cardiology as needed and medication regimen as prescribed.  No recent CP.

## 2023-10-04 NOTE — Assessment & Plan Note (Signed)
Chronic.  As evidenced by bruising upper extremities with ASA use.  Recommend gentle skin care and monitor for wounds, if present immediately notify provider.

## 2023-10-04 NOTE — Assessment & Plan Note (Signed)
Stable at this time, event took place 07/28/20.  Recommend he continue all current medications and collaboration with cardiology.

## 2023-10-04 NOTE — Progress Notes (Signed)
 BP 115/67   Pulse 86   Temp 98 F (36.7 C) (Oral)   Ht 5\' 8"  (1.727 m)   Wt 191 lb 3.2 oz (86.7 kg)   SpO2 92%   BMI 29.07 kg/m    Subjective:    Patient ID: Andrew Potts, male    DOB: 11-12-1950, 73 y.o.   MRN: [id number]  HPI: Andrew Potts is a 73 y.o. male  Chief Complaint  Patient presents with   COPD   Congestive Heart Failure   Hyperlipidemia   Hypertension   HYPERLIPIDEMIA & HF Taking Lisinopril 20 MG, Atorvastatin 80 MG, Metoprolol 12.5 MG BID, and Lasix 10 MG daily.  HF clinic last on 02/07/21.  Repeat echo on 06/27/23 noted EF 45% mild left ventricular dysfunction with no LVH. History of STEMI 07/28/20.  Saw cardiology on 05/22/23.  Aortic atherosclerosis noted on imaging 09/20/20. Hyperlipidemia status: good compliance Satisfied with current treatment?  yes Side effects:  no Medication compliance: good compliance Past cholesterol meds: none Supplements: none Aspirin:  no The ASCVD Risk score (Arnett DK, et al., 2019) failed to calculate for the following reasons:   Risk score cannot be calculated because patient has a medical history suggesting prior/existing ASCVD Chest pain:  no Coronary artery disease:  no Family history CAD:  no Family history early CAD:  no   COPD Uses Anoro daily + Proair.  Continues to cestan from smoking. COPD status: stable Satisfied with current treatment?: yes Oxygen use: no Dyspnea frequency: occasional Cough frequency: occasional Rescue inhaler frequency: rarely Limitation of activity: no Productive cough: none Last Spirometry: FEV1 76% & FEV1/FVC 90% in August 2020 Pneumovax: Up to Date Influenza: Up to Date    HEPATITIS C Last saw GI 01/29/22. Jonnie Kind and completed course.  Did have blood transfusion in 1973.  No excess alcohol use at home.   Duration since diagnosis: 2020 Hep C transmission: possibly via blood transfusion in 1973 Genotype: 1a Viral load:  340,000 international units/mL. Hepatology  evaluation:yes Liver biopsy:no  Cirrhosis: no Antiviral therapy:not yet Hepatocellular carcinoma screening: no Esophageal varices screening/EGD: no Hepatitis A Vaccine: Not up to Date Hepatitis B Vaccine: unknown Pneumovax Vaccine: Up to Date    Relevant past medical, surgical, family and social history reviewed and updated as indicated. Interim medical history since our last visit reviewed. Allergies and medications reviewed and updated.  Review of Systems  Constitutional:  Negative for activity change, diaphoresis, fatigue and fever.  Respiratory:  Positive for shortness of breath (mild with activity on occasion). Negative for cough, chest tightness and wheezing.   Cardiovascular:  Negative for chest pain, palpitations and leg swelling.  Gastrointestinal: Negative.   Endocrine: Negative for cold intolerance, heat intolerance, polydipsia, polyphagia and polyuria.  Neurological: Negative.   Psychiatric/Behavioral: Negative.      Per HPI unless specifically indicated above     Objective:    BP 115/67   Pulse 86   Temp 98 F (36.7 C) (Oral)   Ht 5\' 8"  (1.727 m)   Wt 191 lb 3.2 oz (86.7 kg)   SpO2 92%   BMI 29.07 kg/m   Wt Readings from Last 3 Encounters:  10/04/23 191 lb 3.2 oz (86.7 kg)  07/05/23 187 lb 12.8 oz (85.2 kg)  07/01/23 191 lb 3.2 oz (86.7 kg)    Physical Exam Vitals and nursing note reviewed.  Constitutional:      General: He is awake. He is not in acute distress.    Appearance: He  is well-developed and well-groomed. He is not ill-appearing.  HENT:     Head: Normocephalic and atraumatic.     Right Ear: Hearing normal. No drainage.     Left Ear: Hearing normal. No drainage.  Eyes:     General: Lids are normal.        Right eye: No discharge.        Left eye: No discharge.     Conjunctiva/sclera: Conjunctivae normal.     Pupils: Pupils are equal, round, and reactive to light.  Neck:     Thyroid: No thyromegaly.     Vascular: No carotid bruit.      Trachea: Trachea normal.  Cardiovascular:     Rate and Rhythm: Normal rate and regular rhythm.     Heart sounds: Normal heart sounds, S1 normal and S2 normal. No murmur heard.    No gallop.  Pulmonary:     Effort: Pulmonary effort is normal.     Breath sounds: Normal breath sounds.  Abdominal:     General: Bowel sounds are normal. There is no distension.     Palpations: Abdomen is soft. There is no hepatomegaly.     Tenderness: There is no abdominal tenderness.     Comments:    Musculoskeletal:        General: Normal range of motion.     Cervical back: Normal range of motion and neck supple.     Right lower leg: No edema.     Left lower leg: No edema.  Skin:    General: Skin is warm and dry.     Capillary Refill: Capillary refill takes less than 2 seconds.     Comments: Scattered pale purple bruises noted to bilateral upper extremities.  Neurological:     Mental Status: He is alert and oriented to person, place, and time.     Deep Tendon Reflexes: Reflexes are normal and symmetric.  Psychiatric:        Attention and Perception: Attention normal.        Mood and Affect: Mood normal.        Speech: Speech normal.        Behavior: Behavior normal. Behavior is cooperative.        Thought Content: Thought content normal.    Results for orders placed or performed in visit on 04/16/23  Microalbumin, Urine Waived   Collection Time: 04/16/23  1:30 PM  Result Value Ref Range   Microalb, Ur Waived 10 0 - 19 mg/L   Creatinine, Urine Waived 50 10 - 300 mg/dL   Microalb/Creat Ratio <30 <30 mg/g  Comprehensive metabolic panel   Collection Time: 04/16/23  1:31 PM  Result Value Ref Range   Glucose 71 70 - 99 mg/dL   BUN 10 8 - 27 mg/dL   Creatinine, Ser 0.45 0.76 - 1.27 mg/dL   eGFR 96 >40 JW/JXB/1.47   BUN/Creatinine Ratio 13 10 - 24   Sodium 139 134 - 144 mmol/L   Potassium 4.3 3.5 - 5.2 mmol/L   Chloride 100 96 - 106 mmol/L   CO2 25 20 - 29 mmol/L   Calcium 9.7 8.6 - 10.2 mg/dL    Total Protein 7.3 6.0 - 8.5 g/dL   Albumin 4.4 3.9 - 4.9 g/dL   Globulin, Total 2.9 1.5 - 4.5 g/dL   Bilirubin Total 1.4 (H) 0.0 - 1.2 mg/dL   Alkaline Phosphatase 100 44 - 121 IU/L   AST 17 0 - 40 IU/L   ALT 13  0 - 44 IU/L  CBC with Differential/Platelet   Collection Time: 04/16/23  1:31 PM  Result Value Ref Range   WBC 9.1 3.4 - 10.8 x10E3/uL   RBC 4.77 4.14 - 5.80 x10E6/uL   Hemoglobin 15.1 13.0 - 17.7 g/dL   Hematocrit 16.1 09.6 - 51.0 %   MCV 98 (H) 79 - 97 fL   MCH 31.7 26.6 - 33.0 pg   MCHC 32.3 31.5 - 35.7 g/dL   RDW 04.5 40.9 - 81.1 %   Platelets 323 150 - 450 x10E3/uL   Neutrophils 50 Not Estab. %   Lymphs 33 Not Estab. %   Monocytes 11 Not Estab. %   Eos 5 Not Estab. %   Basos 1 Not Estab. %   Neutrophils Absolute 4.6 1.4 - 7.0 x10E3/uL   Lymphocytes Absolute 3.0 0.7 - 3.1 x10E3/uL   Monocytes Absolute 1.0 (H) 0.1 - 0.9 x10E3/uL   EOS (ABSOLUTE) 0.5 (H) 0.0 - 0.4 x10E3/uL   Basophils Absolute 0.1 0.0 - 0.2 x10E3/uL   Immature Granulocytes 0 Not Estab. %   Immature Grans (Abs) 0.0 0.0 - 0.1 x10E3/uL  Lipid Panel w/o Chol/HDL Ratio   Collection Time: 04/16/23  1:31 PM  Result Value Ref Range   Cholesterol, Total 137 100 - 199 mg/dL   Triglycerides 914 (H) 0 - 149 mg/dL   HDL 38 (L) >78 mg/dL   VLDL Cholesterol Cal 28 5 - 40 mg/dL   LDL Chol Calc (NIH) 71 0 - 99 mg/dL  TSH   Collection Time: 04/16/23  1:31 PM  Result Value Ref Range   TSH 0.795 0.450 - 4.500 uIU/mL  Magnesium   Collection Time: 04/16/23  1:31 PM  Result Value Ref Range   Magnesium 2.1 1.6 - 2.3 mg/dL  PSA   Collection Time: 04/16/23  1:31 PM  Result Value Ref Range   Prostate Specific Ag, Serum 1.8 0.0 - 4.0 ng/mL      Assessment & Plan:   Problem List Items Addressed This Visit       Cardiovascular and Mediastinum   Aortic atherosclerosis (HCC)   Chronic.  Noted on past imaging.  Recommend he continue daily statin and ASA for prevention + complete cessation of smoking.       Relevant Orders   Comprehensive metabolic panel   Lipid Panel w/o Chol/HDL Ratio   Chronic systolic heart failure (HCC)   Since STEMI 07/28/20.  Euvolemic.  At this time continue current medication regimen and collaboration with cardiology.  Recommend: - Reminded to call for an overnight weight gain of >2 pounds or a weekly weight gain of >5 pounds - not adding salt to food and read food labels. Reviewed the importance of keeping daily sodium intake to 2000mg  daily.  Highly recommend he cut back on sodium intake as suspect this is causing some edema. - Avoid Ibuprofen products.      Relevant Orders   Comprehensive metabolic panel   Lipid Panel w/o Chol/HDL Ratio   Essential hypertension   Chronic, stable.  BP well at goal today.  Recommend he monitor BP at home three mornings a week and document for provider, educated him on this.  Continue diet and exercise focus, DASH diet.  Labs: CMP.  Urine ALB 10 (October 2024), recheck at physical.  Continue current medication regimen and collaboration with cardiology as needed.  Praised for ongoing adherence to regimen.       Relevant Orders   Comprehensive metabolic panel   Senile purpura (  HCC)   Chronic.  As evidenced by bruising upper extremities with ASA use.  Recommend gentle skin care and monitor for wounds, if present immediately notify provider.      Stable angina (HCC)   Ongoing with STEMI 07/28/20, at this time will continue collaboration with cardiology as needed and medication regimen as prescribed.  No recent CP.      Relevant Medications   meloxicam (MOBIC) 15 MG tablet     Respiratory   COPD (chronic obstructive pulmonary disease) (HCC) - Primary   Chronic, ongoing.  Spirometry FEV1/FVC 90% and FEV1 76% in August 2020 - will repeat at physical.   Recommend continued cessation smoking.  Continue current medication regimen, Anoro offering him benefit with minimal rescue use.  He refuses Lung CA Screening CT at this time due to  current medical bills, recommended he obtain this.          Digestive   Chronic active hepatitis (HCC)   Chronic, ongoing, completed treatment.  Continue collaboration with GI, provided him number to call and schedule with them for recheck.        Other   History of ST elevation myocardial infarction (STEMI)   Stable at this time, event took place 07/28/20.  Recommend he continue all current medications and collaboration with cardiology.       HLD (hyperlipidemia)   Chronic, ongoing.  Continue current medication regimen and adjust as needed.  Lipid panel today.      Relevant Orders   Comprehensive metabolic panel   Lipid Panel w/o Chol/HDL Ratio   Other Visit Diagnoses       Left hip pain       Relevant Medications   meloxicam (MOBIC) 15 MG tablet        Follow up plan: Return in about 6 months (around 04/05/2024) for Annual Physical -- after 04/15/24.

## 2023-10-04 NOTE — Assessment & Plan Note (Signed)
Chronic, ongoing, completed treatment.  Continue collaboration with GI, provided him number to call and schedule with them for recheck.

## 2023-10-04 NOTE — Assessment & Plan Note (Signed)
Chronic.  Noted on past imaging.  Recommend he continue daily statin and ASA for prevention + complete cessation of smoking.

## 2023-10-05 ENCOUNTER — Encounter: Payer: Self-pay | Admitting: Nurse Practitioner

## 2023-10-05 LAB — COMPREHENSIVE METABOLIC PANEL
ALT: 15 IU/L (ref 0–44)
AST: 21 IU/L (ref 0–40)
Albumin: 4.6 g/dL (ref 3.9–4.9)
Alkaline Phosphatase: 105 IU/L (ref 44–121)
BUN/Creatinine Ratio: 11 (ref 10–24)
BUN: 9 mg/dL (ref 8–27)
Bilirubin Total: 1.3 mg/dL — ABNORMAL HIGH (ref 0.0–1.2)
CO2: 26 mmol/L (ref 20–29)
Calcium: 9.8 mg/dL (ref 8.6–10.2)
Chloride: 102 mmol/L (ref 96–106)
Creatinine, Ser: 0.83 mg/dL (ref 0.76–1.27)
Globulin, Total: 2.5 g/dL (ref 1.5–4.5)
Glucose: 74 mg/dL (ref 70–99)
Potassium: 4.1 mmol/L (ref 3.5–5.2)
Sodium: 141 mmol/L (ref 134–144)
Total Protein: 7.1 g/dL (ref 6.0–8.5)
eGFR: 94 mL/min/{1.73_m2} (ref >59)

## 2023-10-05 LAB — LIPID PANEL W/O CHOL/HDL RATIO
Cholesterol, Total: 146 mg/dL (ref 100–199)
HDL: 37 mg/dL — ABNORMAL LOW (ref >39)
LDL Chol Calc (NIH): 70 mg/dL (ref 0–99)
Triglycerides: 237 mg/dL — ABNORMAL HIGH (ref 0–149)
VLDL Cholesterol Cal: 39 mg/dL (ref 5–40)

## 2023-10-05 NOTE — Progress Notes (Signed)
Contacted via MyChart

## 2023-10-09 ENCOUNTER — Encounter: Payer: Self-pay | Admitting: Pediatrics

## 2023-10-09 NOTE — Progress Notes (Signed)
 This encounter was created in error - please disregard.  This encounter was created in error - please disregard.

## 2023-10-15 ENCOUNTER — Ambulatory Visit: Payer: Self-pay | Admitting: Nurse Practitioner

## 2023-10-22 ENCOUNTER — Ambulatory Visit: Payer: Self-pay | Admitting: *Deleted

## 2023-10-22 NOTE — Patient Outreach (Signed)
 Complex Care Management   Visit Note  10/22/2023  Name:  Andrew Potts MRN: [id number] DOB: [birth year]  Situation: Referral received for Complex Care Management related to COPD I obtained verbal consent from Patient.  Visit completed with patient  on the phone  Background:   Past Medical History:  Diagnosis Date   Anginal pain (HCC)    Aortic atherosclerosis (HCC)    "extensive" per CT imaging on 09/20/2020   Arthritis    Cardiac arrest with successful resuscitation (HCC) 07/28/2020   V.fib arrest in the setting of acute STEMI   CHF (congestive heart failure) (HCC)    COPD (chronic obstructive pulmonary disease) (HCC)    Coronary artery disease    Diverticulosis of colon 09/20/2020   multifocal   Duodenal diverticulum 09/20/2020   Erectile dysfunction    takes PDE5i (sildenafil) medication   GERD (gastroesophageal reflux disease)    HCV (hepatitis C virus)    HLD (hyperlipidemia)    Hypertension    Myocardial infarction (HCC) 07/28/2020   STEMI --> PCI for high grade RCA disease   Pneumonia    Status post insertion of drug eluting coronary artery stent 07/28/2020   DES x 1 to mRCA with overlapping stents to the dRCA    Assessment: Patient Reported Symptoms:  Cognitive Alert and oriented to person, place, and time  Neurological No symptoms reported    HEENT No symptoms reported    Cardiovascular No symptoms reported    Respiratory No symptoms reported    Endocrine No symptoms reported    Gastrointestinal No symptoms reported    Genitourinary No symptoms reported    Integumentary No symptoms reported    Musculoskeletal No symptoms reported    Psychosocial No symptoms reported     There were no vitals filed for this visit.  Medications Reviewed Today     Reviewed by Rodney Langton, RN (Registered Nurse) on 10/22/23 at 1145  Med List Status: <None>   Medication Order Taking? Sig Documenting Provider Last Dose Status Informant  albuterol (VENTOLIN  HFA) 108 (90 Base) MCG/ACT inhaler [id number] Yes Inhale 2 puffs into the lungs every 6 (six) hours as needed for wheezing or shortness of breath. Aura Dials T, NP Taking Active   aspirin EC 81 MG EC tablet [id number] Yes Take 1 tablet (81 mg total) by mouth daily. Swallow whole. Willeen Niece, MD Taking Active Self  atorvastatin (LIPITOR) 80 MG tablet [id number] Yes Take 1 tablet (80 mg total) by mouth daily. Aura Dials T, NP Taking Active   b complex vitamins capsule [id number] Yes Take 1 capsule by mouth daily. [provider] Taking Active   [facility]) [id number] Yes Take 237 mLs by mouth daily. [provider] Taking Active Self  furosemide (LASIX) 20 MG tablet [id number] Yes Take 0.5 tablets (10 mg total) by mouth daily. Aura Dials T, NP Taking Active   lisinopril (ZESTRIL) 10 MG tablet [id number] Yes Take 1 tablet (10 mg total) by mouth daily. Aura Dials T, NP Taking Active   meloxicam (MOBIC) 15 MG tablet [id number] Yes Take 1 tablet (15 mg total) by mouth daily as needed for pain. Aura Dials T, NP Taking Active   metoprolol tartrate (LOPRESSOR) 25 MG tablet [id number] Yes Take 0.5 tablets (12.5 mg total) by mouth 2 (two) times daily. Aura Dials T, NP Taking Active   nitroGLYCERIN (NITROSTAT) 0.4 MG SL tablet [id number] Yes Place 1 tablet (0.4 mg total) under the tongue every 5 (five) minutes as needed for chest  pain. Aura Dials T, NP Taking Active   pantoprazole (PROTONIX) 40 MG tablet [id number] Yes Take 1 tablet (40 mg total) by mouth daily. Aura Dials T, NP Taking Active   sildenafil (REVATIO) 20 MG tablet [id number] Yes Take 1 tablet (20 mg total) by mouth daily as needed (erectile dysfunction). Aura Dials T, NP Taking Active   umeclidinium-vilanterol (ANORO ELLIPTA) 62.5-25 MCG/ACT AEPB [id number] Yes Inhale 1 puff into the lungs daily at 6 (six) AM. [name], NP Taking Active             Recommendation:   PCP  Follow-up as scheduled in 6 months, AWV in July  Follow Up Plan:   Patient has met all care management goals. Care Management case will be closed. Patient has been provided contact information should new needs arise.   Rodney Langton, RN, MSN, CCM [facility], [facility] RN Care Coordinator Direct Dial: [phone] / Main [phone] Fax [phone] Email: [email or web address]: [email or web address]

## 2023-10-22 NOTE — Patient Instructions (Signed)
 Visit Information  Thank you for taking time to visit with me today. Please don't hesitate to contact me if I can be of assistance to you before our next scheduled appointment.  Patient has met all care management goals. Care Management case will be closed. Patient has been provided contact information should new needs arise.   Please call the care guide team at [phone] if you need to cancel, schedule, or reschedule an appointment.   Please call the Suicide and Crisis Lifeline: 988 call the [place] National Suicide Prevention Lifeline: 639-019-6409 or TTY: 9727055712 TTY (463) 293-6376) to talk to a trained counselor call 1-800-273-TALK (toll free, 24 hour hotline) call 911 if you are experiencing a Mental Health or Behavioral Health Crisis or need someone to talk to.  Rodney Langton, RN, MSN, CCM [facility], [facility] RN Care Coordinator Direct Dial: [phone] / Main [phone] Fax [phone] Email: [email or web address]: [email or web address]

## 2023-10-30 ENCOUNTER — Other Ambulatory Visit: Payer: Self-pay | Admitting: Nurse Practitioner

## 2023-10-31 NOTE — Telephone Encounter (Signed)
 Requested Prescriptions  Pending Prescriptions Disp Refills   albuterol (VENTOLIN HFA) 108 (90 Base) MCG/ACT inhaler [Pharmacy Med Name: ALBUTEROL HFA 90 MCG INHALER] 8.5 g 0    Sig: Inhale 2 puffs into the lungs every 6 (six) hours as needed for wheezing or shortness of breath.     Pulmonology:  Beta Agonists 2 Passed - 10/31/2023 12:15 PM      Passed - Last BP in normal range    BP Readings from Last 1 Encounters:  10/04/23 115/67         Passed - Last Heart Rate in normal range    Pulse Readings from Last 1 Encounters:  10/04/23 86         Passed - Valid encounter within last 12 months    Recent Outpatient Visits           3 weeks ago Simple chronic bronchitis (HCC)   [facility] [place], Lavelle Posey, NP

## 2024-01-16 ENCOUNTER — Ambulatory Visit: Payer: Self-pay

## 2024-01-16 VITALS — Ht 68.0 in | Wt 190.0 lb

## 2024-01-16 DIAGNOSIS — Z0000 Encounter for general adult medical examination without abnormal findings: Secondary | ICD-10-CM | POA: Diagnosis not present

## 2024-01-16 NOTE — Patient Instructions (Signed)
 Andrew Potts , Thank you for taking time out of your busy schedule to complete your Annual Wellness Visit with me. I enjoyed our conversation and look forward to speaking with you again next year. I, as well as your care team,  appreciate your ongoing commitment to your health goals. Please review the following plan we discussed and let me know if I can assist you in the future. Your Game plan/ To Do List    Referrals: None  Follow up Visits: Next Medicare AWV with our clinical staff: 01/28/25 @ 3:50pm (phone visit)   Have you seen your provider in the last 6 months (3 months if uncontrolled diabetes)? Yes Next Office Visit with your provider: 04/17/24 @ 2:00pm with Jolene [name], NP  Clinician Recommendations: Recommend getting a routine eye exam at your earliest convenience. Aim for 30 minutes of exercise or brisk walking, 6-8 glasses of water, and 5 servings of fruits and vegetables each day.       This is a list of the screening recommended for you and due dates:  Health Maintenance  Topic Date Due   Zoster (Shingles) Vaccine (1 of 2) Never done   Colon Cancer Screening  Never done   Hepatitis B Vaccine (1 of 3 - Risk 3-dose series) Never done   Screening for Lung Cancer  09/20/2021   COVID-19 Vaccine (7 - 2024-25 season) 03/17/2023   Flu Shot  02/14/2024   Medicare Annual Wellness Visit  01/15/2025   Pneumococcal Vaccine for age over 25  Completed   Hepatitis C Screening  Completed   HPV Vaccine  Aged Out   Meningitis B Vaccine  Aged Out   DTaP/Tdap/Td vaccine  Discontinued    Advanced directives: (Provided) Advance directive discussed with you today. I have provided a copy for you to complete at home and have notarized. Once this is complete, please bring a copy in to our office so we can scan it into your chart.  Advance Care Planning is important because it:  [x]  Makes sure you receive the medical care that is consistent with your values, goals, and preferences  [x]  It  provides guidance to your family and loved ones and reduces their decisional burden about whether or not they are making the right decisions based on your wishes.  Follow the link provided in your after visit summary or read over the paperwork we have mailed to you to help you started getting your Advance Directives in place. If you need assistance in completing these, please reach out to us  so that we can help you!  See attachments for Preventive Care and Fall Prevention Tips.   Fall Prevention in the Home, Adult Falls can cause injuries and affect people of all ages. There are many simple things that you can do to make your home safe and to help prevent falls. If you need it, ask for help making these changes. What actions can I take to prevent falls? General information Use good lighting in all rooms. Make sure to: Replace any light bulbs that burn out. Turn on lights if it is dark and use night-lights. Keep items that you use often in easy-to-reach places. Lower the shelves around your home if needed. Move furniture so that there are clear paths around it. Do not keep throw rugs or other things on the floor that can make you trip. If any of your floors are uneven, fix them. Add color or contrast paint or tape to clearly mark and help you see:  Grab bars or handrails. First and last steps of staircases. Where the edge of each step is. If you use a ladder or stepladder: Make sure that it is fully opened. Do not climb a closed ladder. Make sure the sides of the ladder are locked in place. Have someone hold the ladder while you use it. Know where your pets are as you move through your home. What can I do in the bathroom?     Keep the floor dry. Clean up any water that is on the floor right away. Remove soap buildup in the bathtub or shower. Buildup makes bathtubs and showers slippery. Use non-skid mats or decals on the floor of the bathtub or shower. Attach bath mats securely with  double-sided, non-slip rug tape. If you need to sit down while you are in the shower, use a non-slip stool. Install grab bars by the toilet and in the bathtub and shower. Do not use towel bars as grab bars. What can I do in the bedroom? Make sure that you have a light by your bed that is easy to reach. Do not use any sheets or blankets on your bed that hang to the floor. Have a firm bench or chair with side arms that you can use for support when you get dressed. What can I do in the kitchen? Clean up any spills right away. If you need to reach something above you, use a sturdy step stool that has a grab bar. Keep electrical cables out of the way. Do not use floor polish or wax that makes floors slippery. What can I do with my stairs? Do not leave anything on the stairs. Make sure that you have a light switch at the top and the bottom of the stairs. Have them installed if you do not have them. Make sure that there are handrails on both sides of the stairs. Fix handrails that are broken or loose. Make sure that handrails are as long as the staircases. Install non-slip stair treads on all stairs in your home if they do not have carpet. Avoid having throw rugs at the top or bottom of stairs, or secure the rugs with carpet tape to prevent them from moving. Choose a carpet design that does not hide the edge of steps on the stairs. Make sure that carpet is firmly attached to the stairs. Fix any carpet that is loose or worn. What can I do on the outside of my home? Use bright outdoor lighting. Repair the edges of walkways and driveways and fix any cracks. Clear paths of anything that can make you trip, such as tools or rocks. Add color or contrast paint or tape to clearly mark and help you see high doorway thresholds. Trim any bushes or trees on the main path into your home. Check that handrails are securely fastened and in good repair. Both sides of all steps should have handrails. Install  guardrails along the edges of any raised decks or porches. Have leaves, snow, and ice cleared regularly. Use sand, salt, or ice melt on walkways during winter months if you live where there is ice and snow. In the garage, clean up any spills right away, including grease or oil spills. What other actions can I take? Review your medicines with your health care provider. Some medicines can make you confused or feel dizzy. This can increase your chance of falling. Wear closed-toe shoes that fit well and support your feet. Wear shoes that have rubber soles and low  heels. Use a cane, walker, scooter, or crutches that help you move around if needed. Talk with your provider about other ways that you can decrease your risk of falls. This may include seeing a physical therapist to learn to do exercises to improve movement and strength. Where to find more information Centers for Disease Control and Prevention, STEADI: TonerPromos.no [employer] on Aging: BaseRingTones.pl National Institute on Aging: BaseRingTones.pl Contact a health care provider if: You are afraid of falling at home. You feel weak, drowsy, or dizzy at home. You fall at home. Get help right away if you: Lose consciousness or have trouble moving after a fall. Have a fall that causes a head injury. These symptoms may be an emergency. Get help right away. Call 911. Do not wait to see if the symptoms will go away. Do not drive yourself to the hospital. This information is not intended to replace advice given to you by your health care provider. Make sure you discuss any questions you have with your health care provider. Document Revised: 03/05/2022 Document Reviewed: 03/05/2022 Elsevier Patient Education  2024 [employer].

## 2024-01-16 NOTE — Progress Notes (Signed)
 Subjective:   Andrew Potts is a 73 y.o. who presents for a Medicare Wellness preventive visit.  As a reminder, Annual Wellness Visits don't include a physical exam, and some assessments may be limited, especially if this visit is performed virtually. We may recommend an in-person follow-up visit with your provider if needed.  Visit Complete: Virtual I connected with  Dugan L Laubscher on 01/16/24 by a audio enabled telemedicine application and verified that I am speaking with the correct person using two identifiers.  Patient Location: Home  Provider Location: Home Office  I discussed the limitations of evaluation and management by telemedicine. The patient expressed understanding and agreed to proceed.  Vital Signs: Because this visit was a virtual/telehealth visit, some criteria may be missing or patient reported. Any vitals not documented were not able to be obtained and vitals that have been documented are patient reported.  VideoDeclined- This patient declined Librarian, academic. Therefore the visit was completed with audio only.  Persons Participating in Visit: Patient.  AWV Questionnaire: Yes: Patient Medicare AWV questionnaire was completed by the patient on 01/12/24; I have confirmed that all information answered by patient is correct and no changes since this date.  Cardiac Risk Factors include: advanced age (>67men, >67 women);male gender;dyslipidemia;hypertension     Objective:    Today's Vitals   01/16/24 1431  Weight: 190 lb (86.2 kg)  Height: 5' 8 (1.727 m)   Body mass index is 28.89 kg/m.     01/16/2024    2:43 PM 01/15/2023    9:06 AM 01/11/2022   10:03 AM 01/09/2021    9:48 AM 12/07/2020    9:13 AM 12/02/2020    4:10 PM 11/04/2020   10:16 AM  Advanced Directives  Does Patient Have a Medical Advance Directive? No No No No No No No  Would patient like information on creating a medical advance directive? Yes  (MAU/Ambulatory/Procedural Areas - Information given) No - Patient declined No - Patient declined  No - Patient declined      Current Medications (verified) Outpatient Encounter Medications as of 01/16/2024  Medication Sig   albuterol  (VENTOLIN  HFA) 108 (90 Base) MCG/ACT inhaler Inhale 2 puffs into the lungs every 6 (six) hours as needed for wheezing or shortness of breath.   aspirin  EC 81 MG EC tablet Take 1 tablet (81 mg total) by mouth daily. Swallow whole.   atorvastatin  (LIPITOR ) 80 MG tablet Take 1 tablet (80 mg total) by mouth daily.   b complex vitamins capsule Take 1 capsule by mouth daily.   Ensure (ENSURE) Take 237 mLs by mouth daily.   furosemide  (LASIX ) 20 MG tablet Take 0.5 tablets (10 mg total) by mouth daily.   lisinopril  (ZESTRIL ) 10 MG tablet Take 1 tablet (10 mg total) by mouth daily.   meloxicam  (MOBIC ) 15 MG tablet Take 1 tablet (15 mg total) by mouth daily as needed for pain.   metoprolol  tartrate (LOPRESSOR ) 25 MG tablet Take 0.5 tablets (12.5 mg total) by mouth 2 (two) times daily.   nitroGLYCERIN  (NITROSTAT ) 0.4 MG SL tablet Place 1 tablet (0.4 mg total) under the tongue every 5 (five) minutes as needed for chest pain.   pantoprazole  (PROTONIX ) 40 MG tablet Take 1 tablet (40 mg total) by mouth daily.   sildenafil  (REVATIO ) 20 MG tablet Take 1 tablet (20 mg total) by mouth daily as needed (erectile dysfunction).   umeclidinium-vilanterol (ANORO ELLIPTA ) 62.5-25 MCG/ACT AEPB Inhale 1 puff into the lungs daily at 6 (  six) AM.   No facility-administered encounter medications on file as of 01/16/2024.    Allergies (verified) Levaquin [levofloxacin], Codeine, and Codone [hydrocodone]   History: Past Medical History:  Diagnosis Date   Anginal pain (HCC)    Aortic atherosclerosis (HCC)    extensive per CT imaging on 09/20/2020   Arthritis    Cardiac arrest with successful resuscitation (HCC) 07/28/2020   V.fib arrest in the setting of acute STEMI   CHF (congestive  heart failure) (HCC)    COPD (chronic obstructive pulmonary disease) (HCC)    Coronary artery disease    Diverticulosis of colon 09/20/2020   multifocal   Duodenal diverticulum 09/20/2020   Erectile dysfunction    takes PDE5i (sildenafil ) medication   GERD (gastroesophageal reflux disease)    HCV (hepatitis C virus)    HLD (hyperlipidemia)    Hypertension    Myocardial infarction (HCC) 07/28/2020   STEMI --> PCI for high grade RCA disease   Pneumonia    Status post insertion of drug eluting coronary artery stent 07/28/2020   DES x 1 to mRCA with overlapping stents to the dRCA   Past Surgical History:  Procedure Laterality Date   ABDOMINAL EXPLORATION SURGERY     CHOLECYSTECTOMY  12/07/2020   COLONOSCOPY     CORONARY/GRAFT ACUTE MI REVASCULARIZATION N/A 07/28/2020   Procedure: CORONARY/GRAFT ACUTE MI REVASCULARIZATION;  Surgeon: Florencio Cara BIRCH, MD;  Location: [facility] INVASIVE CV LAB;  Service: Cardiovascular;  Laterality: N/A;   LEFT HEART CATH AND CORONARY ANGIOGRAPHY N/A 07/28/2020   Procedure: LEFT HEART CATH AND CORONARY ANGIOGRAPHY;  Surgeon: Florencio Cara BIRCH, MD;  Location: [facility] INVASIVE CV LAB;  Service: Cardiovascular;  Laterality: N/A;   NASAL SINUS SURGERY     Family History  Problem Relation Age of Onset   Thyroid  disease Mother    Alcohol abuse Father    Alcohol abuse Brother    Aneurysm Brother    Social History   Socioeconomic History   Marital status: Divorced    Spouse name: Not on file   Number of children: 1   Years of education: Not on file   Highest education level: 12th grade  Occupational History   Occupation: unload trucks    Comment: [employer]  Tobacco Use   Smoking status: Former    Current packs/day: 0.00    Average packs/day: 0.5 packs/day for 47.0 years (23.5 ttl pk-yrs)    Types: Cigarettes    Start date: 07/28/1973    Quit date: 07/28/2020    Years since quitting: 3.4   Smokeless tobacco: Never   Tobacco comments:    08/24/20 given  quit smoking paperwork and encouraged continued cessation  Vaping Use   Vaping status: Never Used  Substance and Sexual Activity   Alcohol use: Not Currently   Drug use: Never   Sexual activity: Yes  Other Topics Concern   Not on file  Social History Narrative   Live alone   Social Drivers of Health   Financial Resource Strain: Low Risk  (01/16/2024)   Overall Financial Resource Strain (CARDIA)    Difficulty of Paying Living Expenses: Not hard at all  Food Insecurity: No Food Insecurity (01/16/2024)   Hunger Vital Sign    Worried About Running Out of Food in the Last Year: Never true    Ran Out of Food in the Last Year: Never true  Transportation Needs: No Transportation Needs (01/16/2024)   PRAPARE - Administrator, Civil Service (Medical): No  Lack of Transportation (Non-Medical): No  Physical Activity: Insufficiently Active (01/16/2024)   Exercise Vital Sign    Days of Exercise per Week: 4 days    Minutes of Exercise per Session: 20 min  Stress: No Stress Concern Present (01/16/2024)   [employer] of Occupational Health - Occupational Stress Questionnaire    Feeling of Stress: Only a little  Social Connections: Socially Isolated (01/16/2024)   Social Connection and Isolation Panel    Frequency of Communication with Friends and Family: Twice a week    Frequency of Social Gatherings with Friends and Family: Twice a week    Attends Religious Services: Never    Database administrator or Organizations: No    Attends Banker Meetings: Never    Marital Status: Divorced    Tobacco Counseling Counseling given: Not Answered Tobacco comments: 08/24/20 given quit smoking paperwork and encouraged continued cessation    Clinical Intake:  Pre-visit preparation completed: Yes  Pain : No/denies pain     BMI - recorded: 28.89 Nutritional Status: BMI 25 -29 Overweight Nutritional Risks: None Diabetes: No  Lab Results  Component Value Date   HGBA1C 5.2  07/29/2020     How often do you need to have someone help you when you read instructions, pamphlets, or other written materials from your doctor or pharmacy?: 1 - Never  Interpreter Needed?: No  Information entered by :: Vina Ned, CMA   Activities of Daily Living     01/16/2024    2:34 PM 01/12/2024    9:08 AM  In your present state of health, do you have any difficulty performing the following activities:  Hearing? 0 0  Vision? 0 0  Difficulty concentrating or making decisions? 0 0  Walking or climbing stairs? 0 0  Dressing or bathing? 0 0  Doing errands, shopping? 0 0  Preparing Food and eating ? N N  Using the Toilet? N N  In the past six months, have you accidently leaked urine? N N  Do you have problems with loss of bowel control? N N  Managing your Medications? N N  Managing your Finances? N N  Housekeeping or managing your Housekeeping? N N    Patient Care Team: [name], Jolene T, NP as PCP - General (Nurse Practitioner) Lucyann Alan HERO, PA-C as Physician Assistant (Cardiology)  I have updated your Care Teams any recent Medical Services you may have received from other providers in the past year.     Assessment:   This is a routine wellness examination for Merlin.  Hearing/Vision screen Hearing Screening - Comments:: Denies hearing loss  Vision Screening - Comments:: Needs routine eye exam, [facility]   Goals Addressed             This Visit's Progress    Patient Stated       Lose 20 lbs     COMPLETED: Weight (lb) < 200 lb (90.7 kg)   190 lb (86.2 kg)      Depression Screen     01/16/2024    2:40 PM 10/04/2023    1:54 PM 07/01/2023    2:05 PM 06/17/2023    2:11 PM 04/16/2023    1:22 PM 01/15/2023    9:02 AM 09/28/2022    1:29 PM  PHQ 2/9 Scores  PHQ - 2 Score 0 0 0 0 0 0 0  PHQ- 9 Score 0 0 0 0 0 0 0    Fall Risk  01/16/2024    2:47 PM 01/12/2024    9:08 AM 10/04/2023    1:53 PM 06/17/2023    2:12 PM 04/16/2023    1:23 PM   Fall Risk   Falls in the past year? 0 0 0 0 0  Number falls in past yr: 0  0 0 0  Injury with Fall? 0  0 0 0  Risk for fall due to : No Fall Risks  No Fall Risks No Fall Risks No Fall Risks  Follow up Falls evaluation completed  Falls evaluation completed Falls evaluation completed Falls evaluation completed    MEDICARE RISK AT HOME:  Medicare Risk at Home Any stairs in or around the home?: No If so, are there any without handrails?: No Home free of loose throw rugs in walkways, pet beds, electrical cords, etc?: Yes Adequate lighting in your home to reduce risk of falls?: Yes Life alert?: No Use of a cane, walker or w/c?: No Grab bars in the bathroom?: Yes Shower chair or bench in shower?: No Elevated toilet seat or a handicapped toilet?: No  TIMED UP AND GO:  Was the test performed?  No  Cognitive Function: 6CIT completed        01/16/2024    2:47 PM 01/15/2023    9:09 AM 01/11/2022   10:04 AM 01/09/2021    9:55 AM 03/10/2019   10:02 AM  6CIT Screen  What Year? 0 points 0 points 0 points 0 points 0 points  What month? 0 points 0 points 0 points 0 points 0 points  What time? 0 points 0 points 0 points 0 points 0 points  Count back from 20 0 points 0 points 0 points 0 points 0 points  Months in reverse 0 points 0 points 0 points 0 points 0 points  Repeat phrase 0 points 0 points 0 points 0 points 0 points  Total Score 0 points 0 points 0 points 0 points 0 points    Immunizations Immunization History  Administered Date(s) Administered   Fluad Quad(high Dose 65+) 04/06/2022, 04/03/2023   Hepatitis A, Adult 01/01/2022, 01/29/2022, 07/04/2022   Influenza, High Dose Seasonal PF 03/21/2019, 04/07/2020   Influenza,inj,Quad PF,6+ Mos 04/07/2018   Influenza-Unspecified 04/07/2021   Moderna Covid-19 Fall Seasonal Vaccine 18yrs & older 06/05/2022   PFIZER(Purple Top)SARS-COV-2 Vaccination 10/07/2019, 11/04/2019, 05/01/2020, 11/08/2020   Pfizer Covid-19 Vaccine Bivalent Booster  46yrs & up 06/13/2021   Pneumococcal Conjugate-13 03/21/2019   Pneumococcal Polysaccharide-23 05/10/2021    Screening Tests Health Maintenance  Topic Date Due   Zoster Vaccines- Shingrix (1 of 2) Never done   Colonoscopy  Never done   Hepatitis B Vaccines (1 of 3 - Risk 3-dose series) Never done   Lung Cancer Screening  09/20/2021   COVID-19 Vaccine (7 - 2024-25 season) 03/17/2023   INFLUENZA VACCINE  02/14/2024   Medicare Annual Wellness (AWV)  01/15/2025   Pneumococcal Vaccine: 50+ Years  Completed   Hepatitis C Screening  Completed   HPV VACCINES  Aged Out   Meningococcal B Vaccine  Aged Out   DTaP/Tdap/Td  Discontinued    Health Maintenance  Health Maintenance Due  Topic Date Due   Zoster Vaccines- Shingrix (1 of 2) Never done   Colonoscopy  Never done   Hepatitis B Vaccines (1 of 3 - Risk 3-dose series) Never done   Lung Cancer Screening  09/20/2021   COVID-19 Vaccine (7 - 2024-25 season) 03/17/2023   Health Maintenance Items Addressed: See Nurse Notes at the end  of this note  Additional Screening:  Vision Screening: Recommended annual ophthalmology exams for early detection of glaucoma and other disorders of the eye. Would you like a referral to an eye doctor? No    Dental Screening: Recommended annual dental exams for proper oral hygiene  Community Resource Referral / Chronic Care Management: CRR required this visit?  No   CCM required this visit?  No   Plan:    I have personally reviewed and noted the following in the patient's chart:   Medical and social history Use of alcohol, tobacco or illicit drugs  Current medications and supplements including opioid prescriptions. Patient is not currently taking opioid prescriptions. Functional ability and status Nutritional status Physical activity Advanced directives List of other physicians Hospitalizations, surgeries, and ER visits in previous 12 months Vitals Screenings to include cognitive,  depression, and falls Referrals and appointments  In addition, I have reviewed and discussed with patient certain preventive protocols, quality metrics, and best practice recommendations. A written personalized care plan for preventive services as well as general preventive health recommendations were provided to patient.   Vina Ned, CMA   01/16/2024   After Visit Summary: (MyChart) Due to this being a telephonic visit, the after visit summary with patients personalized plan was offered to patient via MyChart   Notes:  Needs routine eye exam. Patient will schedule. Declined Covid and Shingles vaccines Declined colonoscopy Declined LDCT scan

## 2024-01-22 ENCOUNTER — Other Ambulatory Visit: Payer: Self-pay | Admitting: Nurse Practitioner

## 2024-01-23 ENCOUNTER — Telehealth: Payer: Self-pay | Admitting: Nurse Practitioner

## 2024-01-23 MED ORDER — UMECLIDINIUM-VILANTEROL 62.5-25 MCG/ACT IN AEPB: 1.0000 | INHALATION_SPRAY | Freq: Every day | RESPIRATORY_TRACT | 4 refills | Status: DC

## 2024-01-23 NOTE — Telephone Encounter (Signed)
 Prescription Request  01/23/2024  LOV: 10/04/2023  What is the name of the medication or equipment? umeclidinium-vilanterol (ANORO ELLIPTA ) 62.5-25 MCG/ACT AEP   Have you contacted your pharmacy to request a refill? Yes   Which pharmacy would you like this sent to?  [facility] - [place], [place] - [address] [place] [address] Phone: [phone] Fax: [phone]    Patient notified that their request is being sent to the clinical staff for review and that they should receive a response within 2 business days.   Please advise at Mobile [phone] (mobile)

## 2024-01-24 NOTE — Telephone Encounter (Signed)
 umeclidinium-vilanterol (ANORO ELLIPTA ) 62.5-25 MCG/ACT AEPB 60 each 4 01/23/2024 --   Sig - Route: Inhale 1 puff into the lungs daily at 6 (six) AM. - Inhalation   Sent to pharmacy as: umeclidinium-vilanterol (ANORO ELLIPTA ) 62.5-25 MCG/ACT Aerosol Powder, Breath Activtivatede   E-Prescribing Status: Receipt confirmed by pharmacy (01/23/2024  4:51 PM EDT)    Requested Prescriptions  Pending Prescriptions Disp Refills   ANORO ELLIPTA  62.5-25 MCG/ACT AEPB [Pharmacy Med Name: ANORO ELLIPTA  62.5-25 MCG INH] 60 each 0    Sig: Inhale 1 puff into the lungs daily at 6 (six) AM.     Pulmonology:  Combination Products Passed - 01/24/2024 10:54 AM      Passed - Valid encounter within last 12 months    Recent Outpatient Visits           3 months ago Simple chronic bronchitis (HCC)   [facility] [place], Melanie DASEN, NP

## 2024-04-17 ENCOUNTER — Ambulatory Visit: Admitting: Nurse Practitioner

## 2024-04-17 ENCOUNTER — Encounter: Payer: Self-pay | Admitting: Nurse Practitioner

## 2024-04-17 VITALS — BP 129/78 | HR 78 | Temp 98.6°F | Resp 15 | Ht 67.99 in | Wt 187.0 lb

## 2024-04-17 DIAGNOSIS — I10 Essential (primary) hypertension: Secondary | ICD-10-CM

## 2024-04-17 DIAGNOSIS — H938X3 Other specified disorders of ear, bilateral: Secondary | ICD-10-CM | POA: Diagnosis not present

## 2024-04-17 DIAGNOSIS — E782 Mixed hyperlipidemia: Secondary | ICD-10-CM

## 2024-04-17 DIAGNOSIS — I5022 Chronic systolic (congestive) heart failure: Secondary | ICD-10-CM | POA: Diagnosis not present

## 2024-04-17 DIAGNOSIS — J449 Chronic obstructive pulmonary disease, unspecified: Secondary | ICD-10-CM | POA: Diagnosis not present

## 2024-04-17 DIAGNOSIS — Z23 Encounter for immunization: Secondary | ICD-10-CM | POA: Diagnosis not present

## 2024-04-17 DIAGNOSIS — Z0000 Encounter for general adult medical examination without abnormal findings: Secondary | ICD-10-CM

## 2024-04-17 DIAGNOSIS — K219 Gastro-esophageal reflux disease without esophagitis: Secondary | ICD-10-CM

## 2024-04-17 DIAGNOSIS — N400 Enlarged prostate without lower urinary tract symptoms: Secondary | ICD-10-CM

## 2024-04-17 DIAGNOSIS — I700 Atherosclerosis of aorta: Secondary | ICD-10-CM

## 2024-04-17 DIAGNOSIS — K732 Chronic active hepatitis, not elsewhere classified: Secondary | ICD-10-CM | POA: Diagnosis not present

## 2024-04-17 DIAGNOSIS — I2089 Other forms of angina pectoris: Secondary | ICD-10-CM

## 2024-04-17 DIAGNOSIS — D692 Other nonthrombocytopenic purpura: Secondary | ICD-10-CM

## 2024-04-17 LAB — MICROALBUMIN, URINE WAIVED
Creatinine, Urine Waived: 10 mg/dL (ref 10–300)
Microalb, Ur Waived: 10 mg/L (ref 0–19)
Microalb/Creat Ratio: <30 mg/g (ref <30)

## 2024-04-17 MED ORDER — FUROSEMIDE 20 MG PO TABS: 20.0000 mg | ORAL_TABLET | Freq: Every day | ORAL | 4 refills | Status: AC

## 2024-04-17 MED ORDER — LISINOPRIL 10 MG PO TABS: 10.0000 mg | ORAL_TABLET | Freq: Every day | ORAL | 4 refills | Status: AC

## 2024-04-17 MED ORDER — SILDENAFIL CITRATE 20 MG PO TABS: 20.0000 mg | ORAL_TABLET | Freq: Every day | ORAL | 1 refills | Status: AC | PRN

## 2024-04-17 MED ORDER — MELOXICAM 15 MG PO TABS
15.0000 mg | ORAL_TABLET | Freq: Every day | ORAL | 0 refills | Status: AC | PRN
Start: 2024-04-17 — End: ?

## 2024-04-17 MED ORDER — ATORVASTATIN CALCIUM 80 MG PO TABS: 80.0000 mg | ORAL_TABLET | Freq: Every day | ORAL | 4 refills | Status: AC

## 2024-04-17 MED ORDER — PANTOPRAZOLE SODIUM 40 MG PO TBEC: 40.0000 mg | DELAYED_RELEASE_TABLET | Freq: Every day | ORAL | 4 refills | Status: AC

## 2024-04-17 MED ORDER — METOPROLOL TARTRATE 25 MG PO TABS: 12.5000 mg | ORAL_TABLET | Freq: Two times a day (BID) | ORAL | 4 refills | Status: AC

## 2024-04-17 NOTE — Patient Instructions (Signed)
 Be Involved in Caring For Your Health:  Taking Medications When medications are taken as directed, they can greatly improve your health. But if they are not taken as prescribed, they may not work. In some cases, not taking them correctly can be harmful. To help ensure your treatment remains effective and safe, understand your medications and how to take them. Bring your medications to each visit for review by your provider.  Your lab results, notes, and after visit summary will be available on My Chart. We strongly encourage you to use this feature. If lab results are abnormal the clinic will contact you with the appropriate steps. If the clinic does not contact you assume the results are satisfactory. You can always view your results on My Chart. If you have questions regarding your health or results, please contact the clinic during office hours. You can also ask questions on My Chart.  We at [facility] are grateful that you chose [place] to provide your care. We strive to provide evidence-based and compassionate care and are always looking for feedback. If you get a survey from the clinic please complete this so we can hear your opinions.  Heart-Healthy Eating Plan Many factors influence your heart health, including eating and exercise habits. Heart health is also called coronary health. Coronary risk increases with abnormal blood fat (lipid) levels. A heart-healthy eating plan includes limiting unhealthy fats, increasing healthy fats, limiting salt (sodium) intake, and making other diet and lifestyle changes. What is my plan? Your health care provider may recommend that: You limit your fat intake to _________% or less of your total calories each day. You limit your saturated fat intake to _________% or less of your total calories each day. You limit the amount of cholesterol in your diet to less than _________ mg per day. You limit the amount of sodium in your diet to less than _________  mg per day. What are tips for following this plan? Cooking Cook foods using methods other than frying. Baking, boiling, grilling, and broiling are all good options. Other ways to reduce fat include: Removing the skin from poultry. Removing all visible fats from meats. Steaming vegetables in water or broth. Meal planning  At meals, imagine dividing your plate into fourths: Fill one-half of your plate with vegetables and green salads. Fill one-fourth of your plate with whole grains. Fill one-fourth of your plate with lean protein foods. Eat 2-4 cups of vegetables per day. One cup of vegetables equals 1 cup (91 g) broccoli or cauliflower florets, 2 medium carrots, 1 large bell pepper, 1 large sweet potato, 1 large tomato, 1 medium white potato, 2 cups (150 g) raw leafy greens. Eat 1-2 cups of fruit per day. One cup of fruit equals 1 small apple, 1 large banana, 1 cup (237 g) mixed fruit, 1 large orange,  cup (82 g) dried fruit, 1 cup (240 mL) 100% fruit juice. Eat more foods that contain soluble fiber. Examples include apples, broccoli, carrots, beans, peas, and barley. Aim to get 25-30 g of fiber per day. Increase your consumption of legumes, nuts, and seeds to 4-5 servings per week. One serving of dried beans or legumes equals  cup (90 g) cooked, 1 serving of nuts is  oz (12 almonds, 24 pistachios, or 7 walnut halves), and 1 serving of seeds equals  oz (8 g). Fats Choose healthy fats more often. Choose monounsaturated and polyunsaturated fats, such as olive and canola oils, avocado oil, flaxseeds, walnuts, almonds, and seeds. Eat  more omega-3 fats. Choose salmon, mackerel, sardines, tuna, flaxseed oil, and ground flaxseeds. Aim to eat fish at least 2 times each week. Check food labels carefully to identify foods with trans fats or high amounts of saturated fat. Limit saturated fats. These are found in animal products, such as meats, butter, and cream. Plant sources of saturated fats  include palm oil, palm kernel oil, and coconut oil. Avoid foods with partially hydrogenated oils in them. These contain trans fats. Examples are stick margarine, some tub margarines, cookies, crackers, and other baked goods. Avoid fried foods. General information Eat more home-cooked food and less restaurant, buffet, and fast food. Limit or avoid alcohol. Limit foods that are high in added sugar and simple starches such as foods made using white refined flour (white breads, pastries, sweets). Lose weight if you are overweight. Losing just 5-10% of your body weight can help your overall health and prevent diseases such as diabetes and heart disease. Monitor your sodium intake, especially if you have high blood pressure. Talk with your health care provider about your sodium intake. Try to incorporate more vegetarian meals weekly. What foods should I eat? Fruits All fresh, canned (in natural juice), or frozen fruits. Vegetables Fresh or frozen vegetables (raw, steamed, roasted, or grilled). Green salads. Grains Most grains. Choose whole wheat and whole grains most of the time. Rice and pasta, including brown rice and pastas made with whole wheat. Meats and other proteins Lean, well-trimmed beef, veal, pork, and lamb. Chicken and [place] without skin. All fish and shellfish. Wild duck, rabbit, pheasant, and venison. Egg whites or low-cholesterol egg substitutes. Dried beans, peas, lentils, and tofu. Seeds and most nuts. Dairy Low-fat or nonfat cheeses, including ricotta and mozzarella. Skim or 1% milk (liquid, powdered, or evaporated). Buttermilk made with low-fat milk. Nonfat or low-fat yogurt. Fats and oils Non-hydrogenated (trans-free) margarines. Vegetable oils, including soybean, sesame, sunflower, olive, avocado, peanut, safflower, corn, canola, and cottonseed. Salad dressings or mayonnaise made with a vegetable oil. Beverages Water (mineral or sparkling). Coffee and tea. Unsweetened ice  tea. Diet beverages. Sweets and desserts Sherbet, gelatin, and fruit ice. Small amounts of dark chocolate. Limit all sweets and desserts. Seasonings and condiments All seasonings and condiments. The items listed above may not be a complete list of foods and beverages you can eat. Contact a dietitian for more options. What foods should I avoid? Fruits Canned fruit in heavy syrup. Fruit in cream or butter sauce. Fried fruit. Limit coconut. Vegetables Vegetables cooked in cheese, cream, or butter sauce. Fried vegetables. Grains Breads made with saturated or trans fats, oils, or whole milk. Croissants. Sweet rolls. Donuts. High-fat crackers, such as cheese crackers and chips. Meats and other proteins Fatty meats, such as hot dogs, ribs, sausage, bacon, rib-eye roast or steak. High-fat deli meats, such as salami and bologna. Caviar. Domestic duck and goose. Organ meats, such as liver. Dairy Cream, sour cream, cream cheese, and creamed cottage cheese. Whole-milk cheeses. Whole or 2% milk (liquid, evaporated, or condensed). Whole buttermilk. Cream sauce or high-fat cheese sauce. Whole-milk yogurt. Fats and oils Meat fat, or shortening. Cocoa butter, hydrogenated oils, palm oil, coconut oil, palm kernel oil. Solid fats and shortenings, including bacon fat, salt pork, lard, and butter. Nondairy cream substitutes. Salad dressings with cheese or sour cream. Beverages Regular sodas and any drinks with added sugar. Sweets and desserts Frosting. Pudding. Cookies. Cakes. Pies. Milk chocolate or white chocolate. Buttered syrups. Full-fat ice cream or ice cream drinks. The items listed above may  not be a complete list of foods and beverages to avoid. Contact a dietitian for more information. Summary Heart-healthy meal planning includes limiting unhealthy fats, increasing healthy fats, limiting salt (sodium) intake and making other diet and lifestyle changes. Lose weight if you are overweight. Losing just  5-10% of your body weight can help your overall health and prevent diseases such as diabetes and heart disease. Focus on eating a balance of foods, including fruits and vegetables, low-fat or nonfat dairy, lean protein, nuts and legumes, whole grains, and heart-healthy oils and fats. This information is not intended to replace advice given to you by your health care provider. Make sure you discuss any questions you have with your health care provider. Document Revised: 08/07/2021 Document Reviewed: 08/07/2021 [employer] Patient Education  2024 [employer].

## 2024-04-17 NOTE — Progress Notes (Signed)
 BP 129/78 (BP Location: Left Arm, Patient Position: Sitting, Cuff Size: Normal)   Pulse 78   Temp 98.6 F (37 C) (Oral)   Resp 15   Ht 5' 7.99 (1.727 m)   Wt 187 lb (84.8 kg)   SpO2 99%   BMI 28.44 kg/m    Subjective:    Patient ID: Andrew Potts, male    DOB: May 18, 1951, 73 y.o.   MRN: [id number]  HPI: Andrew Potts is a 73 y.o. male presenting on 04/17/2024 for comprehensive medical examination. Current medical complaints include:none  He currently lives with: self Interim Problems from his last visit: no  HYPERTENSION/HYPERLIPIDEMIA & HF Continues Lisinopril  10 MG, Lasix  10 MG daily, Metoprolol  12.5 MG BID,and Atorvastatin  80 MG.  Echo on 01/24/21 noted EF 55 to 60% with no LVH. History of STEMI 07/28/20. Last saw cardiology on 05/22/23. Saw HF Clinic in past, last in 2022. Satisfied with current treatment? yes Duration of hypertension: chronic BP monitoring frequency: not checking BP range:  BP medication side effects: no Duration of hyperlipidemia: chronic Cholesterol medication side effects: no Cholesterol supplements: none Medication compliance: good compliance Aspirin : yes Recent stressors: no Recurrent headaches: no Visual changes: no Palpitations: no Dyspnea: no Chest pain: no Lower extremity edema: a little at sock line of left foot Dizzy/lightheaded: no   COPD Continues Anoro and Albuterol  as needed. Quit smoking after his STEMI.  COPD status: stable Satisfied with current treatment?: yes Oxygen use: no Dyspnea frequency: no Cough frequency: no Rescue inhaler frequency: very seldom Limitation of activity: no Productive cough: none Last Spirometry: FEV1 76% & FEV1/FVC 90% in August 2020 Pneumovax: Up to Date Influenza: Up to Date    HEPATITIS C Took Epclusa. Never went for retesting after treatment and does not want to at this time. He completed entire treatment option. Did have blood transfusion in 1973.  Saw GI last on 01/29/22. Takes Protonix   daily for GERD. Duration since diagnosis: 2020 Hep C transmission: possibly via blood transfusion in 1973 Genotype: 1a Viral load:  340,000 international units/mL. Hepatology evaluation:yes Liver biopsy:no  Cirrhosis: no Antiviral therapy:not yet Hepatocellular carcinoma screening: no Esophageal varices screening/EGD: no Hepatitis A Vaccine: Not up to Date Hepatitis B Vaccine: unknown Pneumovax Vaccine: Up to Date   Functional Status Survey: Is the patient deaf or have difficulty hearing?: No Does the patient have difficulty seeing, even when wearing glasses/contacts?: No Does the patient have difficulty concentrating, remembering, or making decisions?: No Does the patient have difficulty walking or climbing stairs?: No Does the patient have difficulty dressing or bathing?: No Does the patient have difficulty doing errands alone such as visiting a doctor's office or shopping?: No  FALL RISK:    04/17/2024    2:00 PM 01/16/2024    2:47 PM 01/12/2024    9:08 AM 10/04/2023    1:53 PM 06/17/2023    2:12 PM  Fall Risk   Falls in the past year? 0 0 0 0 0  Number falls in past yr: 0 0  0 0  Injury with Fall? 0 0  0 0  Risk for fall due to : No Fall Risks No Fall Risks  No Fall Risks No Fall Risks  Follow up Falls evaluation completed Falls evaluation completed  Falls evaluation completed Falls evaluation completed   Depression Screen    04/17/2024    2:01 PM 01/16/2024    2:40 PM 10/04/2023    1:54 PM 07/01/2023    2:05 PM 06/17/2023  2:11 PM  Depression screen PHQ 2/9  Decreased Interest 0 0 0 0 0  Down, Depressed, Hopeless 0 0 0 0 0  PHQ - 2 Score 0 0 0 0 0  Altered sleeping 0 0 0 0 0  Tired, decreased energy 0 0 0 0 0  Change in appetite 0 0 0 0 0  Feeling bad or failure about yourself  0 0 0 0 0  Trouble concentrating 0 0 0 0 0  Moving slowly or fidgety/restless 0 0 0 0 0  Suicidal thoughts 0 0 0 0 0  PHQ-9 Score 0 0 0 0 0  Difficult doing work/chores  Not difficult at  all Not difficult at all  Not difficult at all      04/17/2024    2:01 PM 10/04/2023    1:54 PM 07/01/2023    2:05 PM 06/17/2023    2:11 PM  GAD 7 : Generalized Anxiety Score  Nervous, Anxious, on Edge 0 0 0 0  Control/stop worrying 0 0 0 0  Worry too much - different things 0 0 0 0  Trouble relaxing 0 0 0 0  Restless 0 0 0 0  Easily annoyed or irritable 0 0 0 0  Afraid - awful might happen 0 0 0 0  Total GAD 7 Score 0 0 0 0  Anxiety Difficulty  Not difficult at all  Not difficult at all   Past Medical History:  Past Medical History:  Diagnosis Date   Anginal pain    Aortic atherosclerosis    extensive per CT imaging on 09/20/2020   Arthritis    Cardiac arrest with successful resuscitation (HCC) 07/28/2020   V.fib arrest in the setting of acute STEMI   CHF (congestive heart failure) (HCC)    COPD (chronic obstructive pulmonary disease) (HCC)    Coronary artery disease    Diverticulosis of colon 09/20/2020   multifocal   Duodenal diverticulum 09/20/2020   Erectile dysfunction    takes PDE5i (sildenafil ) medication   GERD (gastroesophageal reflux disease)    HCV (hepatitis C virus)    HLD (hyperlipidemia)    Hypertension    Myocardial infarction (HCC) 07/28/2020   STEMI --> PCI for high grade RCA disease   Pneumonia    Status post insertion of drug eluting coronary artery stent 07/28/2020   DES x 1 to mRCA with overlapping stents to the dRCA    Surgical History:  Past Surgical History:  Procedure Laterality Date   ABDOMINAL EXPLORATION SURGERY     CHOLECYSTECTOMY  12/07/2020   COLONOSCOPY     CORONARY/GRAFT ACUTE MI REVASCULARIZATION N/A 07/28/2020   Procedure: CORONARY/GRAFT ACUTE MI REVASCULARIZATION;  Surgeon: Florencio Cara BIRCH, MD;  Location: [facility];  Service: Cardiovascular;  Laterality: N/A;   LEFT HEART CATH AND CORONARY ANGIOGRAPHY N/A 07/28/2020   Procedure: LEFT HEART CATH AND CORONARY ANGIOGRAPHY;  Surgeon: Florencio Cara BIRCH, MD;   Location: [facility];  Service: Cardiovascular;  Laterality: N/A;   NASAL SINUS SURGERY      Medications:  Current Outpatient Medications on File Prior to Visit  Medication Sig   albuterol  (VENTOLIN  HFA) 108 (90 Base) MCG/ACT inhaler Inhale 2 puffs into the lungs every 6 (six) hours as needed for wheezing or shortness of breath.   aspirin  EC 81 MG EC tablet Take 1 tablet (81 mg total) by mouth daily. Swallow whole.   b complex vitamins capsule Take 1 capsule by mouth daily.   Ensure (ENSURE) Take  237 mLs by mouth daily.   nitroGLYCERIN  (NITROSTAT ) 0.4 MG SL tablet Place 1 tablet (0.4 mg total) under the tongue every 5 (five) minutes as needed for chest pain.   umeclidinium-vilanterol (ANORO ELLIPTA ) 62.5-25 MCG/ACT AEPB Inhale 1 puff into the lungs daily at 6 (six) AM.   No current facility-administered medications on file prior to visit.    Allergies:  Allergies  Allergen Reactions   Levaquin [Levofloxacin] Swelling   Codeine Itching    Abdominal pain, nose itching   Codone [Hydrocodone] Other (See Comments)    Abdominal pain, felt loopy    Social History:  Social History   Socioeconomic History   Marital status: Divorced    Spouse name: Not on file   Number of children: 1   Years of education: Not on file   Highest education level: 12th grade  Occupational History   Occupation: unload trucks    Comment: [employer]  Tobacco Use   Smoking status: Former    Current packs/day: 0.00    Average packs/day: 0.5 packs/day for 47.0 years (23.5 ttl pk-yrs)    Types: Cigarettes    Start date: 07/28/1973    Quit date: 07/28/2020    Years since quitting: 3.7   Smokeless tobacco: Never   Tobacco comments:    08/24/20 given quit smoking paperwork and encouraged continued cessation  Vaping Use   Vaping status: Never Used  Substance and Sexual Activity   Alcohol use: Not Currently   Drug use: Never   Sexual activity: Yes  Other Topics Concern   Not on file  Social History  Narrative   Live alone   Social Drivers of Health   Financial Resource Strain: Low Risk  (01/16/2024)   Overall Financial Resource Strain (CARDIA)    Difficulty of Paying Living Expenses: Not hard at all  Food Insecurity: No Food Insecurity (01/16/2024)   Hunger Vital Sign    Worried About Running Out of Food in the Last Year: Never true    Ran Out of Food in the Last Year: Never true  Transportation Needs: No Transportation Needs (01/16/2024)   PRAPARE - Administrator, Civil Service (Medical): No    Lack of Transportation (Non-Medical): No  Physical Activity: Insufficiently Active (01/16/2024)   Exercise Vital Sign    Days of Exercise per Week: 4 days    Minutes of Exercise per Session: 20 min  Stress: No Stress Concern Present (01/16/2024)   [employer] of Occupational Health - Occupational Stress Questionnaire    Feeling of Stress: Only a little  Social Connections: Socially Isolated (01/16/2024)   Social Connection and Isolation Panel    Frequency of Communication with Friends and Family: Twice a week    Frequency of Social Gatherings with Friends and Family: Twice a week    Attends Religious Services: Never    Database administrator or Organizations: No    Attends Banker Meetings: Never    Marital Status: Divorced  Catering manager Violence: Not At Risk (01/16/2024)   Humiliation, Afraid, Rape, and Kick questionnaire    Fear of Current or Ex-Partner: No    Emotionally Abused: No    Physically Abused: No    Sexually Abused: No   Social History   Tobacco Use  Smoking Status Former   Current packs/day: 0.00   Average packs/day: 0.5 packs/day for 47.0 years (23.5 ttl pk-yrs)   Types: Cigarettes   Start date: 07/28/1973   Quit date: 07/28/2020   Years  since quitting: 3.7  Smokeless Tobacco Never  Tobacco Comments   08/24/20 given quit smoking paperwork and encouraged continued cessation   Social History   Substance and Sexual Activity  Alcohol Use  Not Currently    Family History:  Family History  Problem Relation Age of Onset   Thyroid  disease Mother    Alcohol abuse Father    Alcohol abuse Brother    Aneurysm Brother    Past medical history, surgical history, medications, allergies, family history and social history reviewed with patient today and changes made to appropriate areas of the chart.   ROS All other ROS negative except what is listed above and in the HPI.      Objective:    BP 129/78 (BP Location: Left Arm, Patient Position: Sitting, Cuff Size: Normal)   Pulse 78   Temp 98.6 F (37 C) (Oral)   Resp 15   Ht 5' 7.99 (1.727 m)   Wt 187 lb (84.8 kg)   SpO2 99%   BMI 28.44 kg/m   Wt Readings from Last 3 Encounters:  04/17/24 187 lb (84.8 kg)  01/16/24 190 lb (86.2 kg)  10/04/23 191 lb 3.2 oz (86.7 kg)    Physical Exam Vitals and nursing note reviewed.  Constitutional:      General: He is awake. He is not in acute distress.    Appearance: Normal appearance. He is well-developed and well-groomed. He is not ill-appearing or toxic-appearing.  HENT:     Head: Normocephalic and atraumatic.     Right Ear: Hearing, tympanic membrane, ear canal and external ear normal. No drainage.     Left Ear: Hearing, tympanic membrane, ear canal and external ear normal. No drainage.     Nose: Nose normal.     Mouth/Throat:     Pharynx: Uvula midline.  Eyes:     General: Lids are normal.        Right eye: No discharge.        Left eye: No discharge.     Extraocular Movements: Extraocular movements intact.     Conjunctiva/sclera: Conjunctivae normal.     Pupils: Pupils are equal, round, and reactive to light.     Visual Fields: Right eye visual fields normal and left eye visual fields normal.  Neck:     Thyroid : No thyromegaly.     Vascular: No carotid bruit or JVD.     Trachea: Trachea normal.  Cardiovascular:     Rate and Rhythm: Normal rate and regular rhythm.     Heart sounds: Normal heart sounds, S1 normal and  S2 normal. No murmur heard.    No gallop.  Pulmonary:     Effort: Pulmonary effort is normal. No accessory muscle usage or respiratory distress.     Breath sounds: Normal breath sounds.  Abdominal:     General: Bowel sounds are normal.     Palpations: Abdomen is soft. There is no hepatomegaly or splenomegaly.     Tenderness: There is no abdominal tenderness.  Musculoskeletal:        General: Normal range of motion.     Cervical back: Normal range of motion and neck supple.     Right lower leg: Edema (trace) present.     Left lower leg: Edema (trace) present.  Lymphadenopathy:     Head:     Right side of head: No submental, submandibular, tonsillar, preauricular or posterior auricular adenopathy.     Left side of head: No submental, submandibular, tonsillar, preauricular or  posterior auricular adenopathy.     Cervical: No cervical adenopathy.  Skin:    General: Skin is warm and dry.     Capillary Refill: Capillary refill takes less than 2 seconds.     Findings: Bruising present. No rash.     Comments: Scattered bruising bilateral upper extremities  Neurological:     Mental Status: He is alert and oriented to person, place, and time.     Gait: Gait is intact.     Deep Tendon Reflexes: Reflexes are normal and symmetric.     Reflex Scores:      Brachioradialis reflexes are 2+ on the right side and 2+ on the left side.      Patellar reflexes are 2+ on the right side and 2+ on the left side. Psychiatric:        Attention and Perception: Attention normal.        Mood and Affect: Mood normal.        Speech: Speech normal.        Behavior: Behavior normal. Behavior is cooperative.        Thought Content: Thought content normal.        Cognition and Memory: Cognition normal.       01/16/2024    2:47 PM 01/15/2023    9:09 AM 01/11/2022   10:04 AM 01/09/2021    9:55 AM 03/10/2019   10:02 AM  6CIT Screen  What Year? 0 points 0 points 0 points 0 points 0 points  What month? 0 points 0  points 0 points 0 points 0 points  What time? 0 points 0 points 0 points 0 points 0 points  Count back from 20 0 points 0 points 0 points 0 points 0 points  Months in reverse 0 points 0 points 0 points 0 points 0 points  Repeat phrase 0 points 0 points 0 points 0 points 0 points  Total Score 0 points 0 points 0 points 0 points 0 points   Results for orders placed or performed in visit on 10/04/23  Comprehensive metabolic panel   Collection Time: 10/04/23  1:58 PM  Result Value Ref Range   Glucose 74 70 - 99 mg/dL   BUN 9 8 - 27 mg/dL   Creatinine, Ser 9.16 0.76 - 1.27 mg/dL   eGFR 94 >40 fO/fpw/8.26   BUN/Creatinine Ratio 11 10 - 24   Sodium 141 134 - 144 mmol/L   Potassium 4.1 3.5 - 5.2 mmol/L   Chloride 102 96 - 106 mmol/L   CO2 26 20 - 29 mmol/L   Calcium  9.8 8.6 - 10.2 mg/dL   Total Protein 7.1 6.0 - 8.5 g/dL   Albumin 4.6 3.9 - 4.9 g/dL   Globulin, Total 2.5 1.5 - 4.5 g/dL   Bilirubin Total 1.3 (H) 0.0 - 1.2 mg/dL   Alkaline Phosphatase 105 44 - 121 IU/L   AST 21 0 - 40 IU/L   ALT 15 0 - 44 IU/L  Lipid Panel w/o Chol/HDL Ratio   Collection Time: 10/04/23  1:58 PM  Result Value Ref Range   Cholesterol, Total 146 100 - 199 mg/dL   Triglycerides 762 (H) 0 - 149 mg/dL   HDL 37 (L) >60 mg/dL   VLDL Cholesterol Cal 39 5 - 40 mg/dL   LDL Chol Calc (NIH) 70 0 - 99 mg/dL      Assessment & Plan:   Problem List Items Addressed This Visit       Cardiovascular and Mediastinum  Stable angina   Ongoing with STEMI 07/28/20, at this time will continue collaboration with cardiology as needed and medication regimen as prescribed.  No recent CP.      Relevant Medications   atorvastatin  (LIPITOR ) 80 MG tablet   furosemide  (LASIX ) 20 MG tablet   lisinopril  (ZESTRIL ) 10 MG tablet   metoprolol  tartrate (LOPRESSOR ) 25 MG tablet   meloxicam  (MOBIC ) 15 MG tablet   sildenafil  (REVATIO ) 20 MG tablet   Senile purpura   Chronic.  As evidenced by bruising upper extremities with ASA use.   Recommend gentle skin care and monitor for wounds, if present immediately notify provider.      Relevant Medications   atorvastatin  (LIPITOR ) 80 MG tablet   furosemide  (LASIX ) 20 MG tablet   lisinopril  (ZESTRIL ) 10 MG tablet   metoprolol  tartrate (LOPRESSOR ) 25 MG tablet   sildenafil  (REVATIO ) 20 MG tablet   Essential hypertension   Chronic, stable.  BP well at goal today.  Recommend he monitor BP at home three mornings a week and document for provider, educated him on this.  Continue diet and exercise focus, DASH diet.  Labs: CBC, TSH, CMP.  Urine ALB 10 (October 2024), recheck today.  Continue current medication regimen and collaboration with cardiology as needed.  Praised for ongoing adherence to regimen.       Relevant Medications   atorvastatin  (LIPITOR ) 80 MG tablet   furosemide  (LASIX ) 20 MG tablet   lisinopril  (ZESTRIL ) 10 MG tablet   metoprolol  tartrate (LOPRESSOR ) 25 MG tablet   sildenafil  (REVATIO ) 20 MG tablet   Other Relevant Orders   Microalbumin, Urine Waived   Comprehensive metabolic panel with GFR   CBC with Differential/Platelet   TSH   Chronic systolic heart failure (HCC) - Primary   Since STEMI 07/28/20.  Baseline 1+ edema at sock line, he will increase Lasix  to full tablet, 20 MG.  At this time continue current medication regimen and collaboration with cardiology.  Recommend: - Reminded to call for an overnight weight gain of >2 pounds or a weekly weight gain of >5 pounds - not adding salt to food and read food labels. Reviewed the importance of keeping daily sodium intake to 2000mg  daily.  Highly recommend he cut back on sodium intake as suspect this is causing some edema. - Avoid Ibuprofen  products.      Relevant Medications   atorvastatin  (LIPITOR ) 80 MG tablet   furosemide  (LASIX ) 20 MG tablet   lisinopril  (ZESTRIL ) 10 MG tablet   metoprolol  tartrate (LOPRESSOR ) 25 MG tablet   sildenafil  (REVATIO ) 20 MG tablet   Aortic atherosclerosis   Chronic.  Noted  on past imaging.  Recommend he continue daily statin and ASA for prevention + complete cessation of smoking.      Relevant Medications   atorvastatin  (LIPITOR ) 80 MG tablet   furosemide  (LASIX ) 20 MG tablet   lisinopril  (ZESTRIL ) 10 MG tablet   metoprolol  tartrate (LOPRESSOR ) 25 MG tablet   sildenafil  (REVATIO ) 20 MG tablet     Respiratory   COPD (chronic obstructive pulmonary disease) (HCC)   Chronic, ongoing.  Spirometry FEV1/FVC 90% and FEV1 76% in August 2020 - will repeat in office when new equipment available   Recommend continued cessation smoking.  Continue current medication regimen, Anoro offering him benefit with minimal rescue use.  He refuses Lung CA Screening CT at this time due to current medical bills, recommended he obtain this.          Digestive   Gastroesophageal reflux  disease without esophagitis   Chronic, stable with Protonix , continue this and check Mag level today.  Risks of PPI use were discussed with patient including bone loss, C. Diff diarrhea, pneumonia, infections, CKD, electrolyte abnormalities.  Verbalizes understanding and chooses to continue the medication.       Relevant Medications   pantoprazole  (PROTONIX ) 40 MG tablet   Other Relevant Orders   Magnesium   Chronic active hepatitis (HCC)   Chronic, ongoing, completed treatment.  Continue collaboration with GI, provided him number to call and schedule with them for recheck.        Other   HLD (hyperlipidemia)   Chronic, ongoing.  Continue current medication regimen and adjust as needed.  Lipid panel today.      Relevant Medications   atorvastatin  (LIPITOR ) 80 MG tablet   furosemide  (LASIX ) 20 MG tablet   lisinopril  (ZESTRIL ) 10 MG tablet   metoprolol  tartrate (LOPRESSOR ) 25 MG tablet   sildenafil  (REVATIO ) 20 MG tablet   Other Relevant Orders   Comprehensive metabolic panel with GFR   Lipid Panel w/o Chol/HDL Ratio   Other Visit Diagnoses       Benign prostatic hyperplasia without  lower urinary tract symptoms       PSA on labs today.   Relevant Orders   PSA     Ear fullness, bilateral       Cerumen impaction both ears, lavage done and cleared.   Relevant Medications   meloxicam  (MOBIC ) 15 MG tablet   Other Relevant Orders   Ear Lavage     Flu vaccine need       Flu vaccine in office today, educated patient.   Relevant Orders   Flu vaccine HIGH DOSE PF(Fluzone Trivalent) (Completed)     Encounter for annual physical exam       Annual physical today, health maintenance reviewed.       Discussed aspirin  prophylaxis for myocardial infarction prevention and decision was made to continue ASA  LABORATORY TESTING:  Health maintenance labs ordered today as discussed above.   The natural history of prostate cancer and ongoing controversy regarding screening and potential treatment outcomes of prostate cancer has been discussed with the patient. The meaning of a false positive PSA and a false negative PSA has been discussed. He indicates understanding of the limitations of this screening test and wishes to proceed with screening PSA testing.  IMMUNIZATIONS:   - Tdap: Tetanus vaccination status reviewed: Refused - Influenza: Refused - Pneumovax: Up to date - Prevnar: Up to date - Zostavax vaccine: Refused  SCREENING: - Colonoscopy: Refused  Discussed with patient purpose of the colonoscopy is to detect colon cancer at curable precancerous or early stages   - AAA Screening: Up to date  05/16/20 normal -Hearing Test: Not applicable  -Spirometry: Up to date   PATIENT COUNSELING:    Sexuality: Discussed sexually transmitted diseases, partner selection, use of condoms, avoidance of unintended pregnancy  and contraceptive alternatives.   Advised to avoid cigarette smoking.  I discussed with the patient that most people either abstain from alcohol or drink within safe limits (<=14/week and <=4 drinks/occasion for males, <=7/weeks and <= 3 drinks/occasion for  females) and that the risk for alcohol disorders and other health effects rises proportionally with the number of drinks per week and how often a drinker exceeds daily limits.  Discussed cessation/primary prevention of drug use and availability of treatment for abuse.   Diet: Encouraged to adjust caloric intake to maintain  or achieve  ideal body weight, to reduce intake of dietary saturated fat and total fat, to limit sodium intake by avoiding high sodium foods and not adding table salt, and to maintain adequate dietary potassium and calcium  preferably from fresh fruits, vegetables, and low-fat dairy products.    Stressed the importance of regular exercise  Injury prevention: Discussed safety belts, safety helmets, smoke detector, smoking near bedding or upholstery.   Dental health: Discussed importance of regular tooth brushing, flossing, and dental visits.   Follow up plan: NEXT PREVENTATIVE PHYSICAL DUE IN 1 YEAR. Return in about 6 months (around 10/16/2024) for HTN/HLD, HF, COPD.

## 2024-04-17 NOTE — Assessment & Plan Note (Signed)
Ongoing with STEMI 07/28/20, at this time will continue collaboration with cardiology as needed and medication regimen as prescribed.  No recent CP.

## 2024-04-17 NOTE — Assessment & Plan Note (Signed)
Chronic, ongoing, completed treatment.  Continue collaboration with GI, provided him number to call and schedule with them for recheck.

## 2024-04-17 NOTE — Assessment & Plan Note (Signed)
 Chronic, stable.  BP well at goal today.  Recommend he monitor BP at home three mornings a week and document for provider, educated him on this.  Continue diet and exercise focus, DASH diet.  Labs: CBC, TSH, CMP.  Urine ALB 10 (October 2024), recheck today.  Continue current medication regimen and collaboration with cardiology as needed.  Praised for ongoing adherence to regimen.

## 2024-04-17 NOTE — Assessment & Plan Note (Signed)
Chronic.  Noted on past imaging.  Recommend he continue daily statin and ASA for prevention + complete cessation of smoking.

## 2024-04-17 NOTE — Assessment & Plan Note (Signed)
Chronic, stable with Protonix, continue this and check Mag level today.  Risks of PPI use were discussed with patient including bone loss, C. Diff diarrhea, pneumonia, infections, CKD, electrolyte abnormalities.  Verbalizes understanding and chooses to continue the medication.

## 2024-04-17 NOTE — Assessment & Plan Note (Signed)
Chronic.  As evidenced by bruising upper extremities with ASA use.  Recommend gentle skin care and monitor for wounds, if present immediately notify provider.

## 2024-04-17 NOTE — Assessment & Plan Note (Signed)
 Chronic, ongoing.  Continue current medication regimen and adjust as needed. Lipid panel today.

## 2024-04-17 NOTE — Assessment & Plan Note (Signed)
 Chronic, ongoing.  Spirometry FEV1/FVC 90% and FEV1 76% in August 2020 - will repeat in office when new equipment available   Recommend continued cessation smoking.  Continue current medication regimen, Anoro offering him benefit with minimal rescue use.  He refuses Lung CA Screening CT at this time due to current medical bills, recommended he obtain this.

## 2024-04-17 NOTE — Assessment & Plan Note (Addendum)
 Since STEMI 07/28/20.  Baseline 1+ edema at sock line, he will increase Lasix  to full tablet, 20 MG.  At this time continue current medication regimen and collaboration with cardiology.  Recommend: - Reminded to call for an overnight weight gain of >2 pounds or a weekly weight gain of >5 pounds - not adding salt to food and read food labels. Reviewed the importance of keeping daily sodium intake to 2000mg  daily.  Highly recommend he cut back on sodium intake as suspect this is causing some edema. - Avoid Ibuprofen  products.

## 2024-04-18 ENCOUNTER — Ambulatory Visit: Payer: Self-pay | Admitting: Nurse Practitioner

## 2024-04-18 LAB — CBC WITH DIFFERENTIAL/PLATELET
Basophils Absolute: 0.1 x10E3/uL (ref 0.0–0.2)
Basos: 1 %
EOS (ABSOLUTE): 0.3 x10E3/uL (ref 0.0–0.4)
Eos: 4 %
Hematocrit: 48.2 % (ref 37.5–51.0)
Hemoglobin: 15.6 g/dL (ref 13.0–17.7)
Immature Grans (Abs): 0 x10E3/uL (ref 0.0–0.1)
Immature Granulocytes: 0 %
Lymphocytes Absolute: 3.1 x10E3/uL (ref 0.7–3.1)
Lymphs: 36 %
MCH: 31 pg (ref 26.6–33.0)
MCHC: 32.4 g/dL (ref 31.5–35.7)
MCV: 96 fL (ref 79–97)
Monocytes Absolute: 0.8 x10E3/uL (ref 0.1–0.9)
Monocytes: 9 %
Neutrophils Absolute: 4.5 x10E3/uL (ref 1.4–7.0)
Neutrophils: 50 %
Platelets: 302 x10E3/uL (ref 150–450)
RBC: 5.04 x10E6/uL (ref 4.14–5.80)
RDW: 14 % (ref 11.6–15.4)
WBC: 8.7 x10E3/uL (ref 3.4–10.8)

## 2024-04-18 LAB — TSH: TSH: 0.903 u[IU]/mL (ref 0.450–4.500)

## 2024-04-18 LAB — COMPREHENSIVE METABOLIC PANEL WITH GFR
ALT: 12 IU/L (ref 0–44)
AST: 19 IU/L (ref 0–40)
Albumin: 4.6 g/dL (ref 3.9–4.9)
Alkaline Phosphatase: 105 IU/L (ref 47–123)
BUN/Creatinine Ratio: 12 (ref 10–24)
BUN: 10 mg/dL (ref 8–27)
Bilirubin Total: 1.6 mg/dL — ABNORMAL HIGH (ref 0.0–1.2)
CO2: 24 mmol/L (ref 20–29)
Calcium: 9.8 mg/dL (ref 8.6–10.2)
Chloride: 97 mmol/L (ref 96–106)
Creatinine, Ser: 0.86 mg/dL (ref 0.76–1.27)
Globulin, Total: 2.5 g/dL (ref 1.5–4.5)
Glucose: 87 mg/dL (ref 70–99)
Potassium: 4.1 mmol/L (ref 3.5–5.2)
Sodium: 138 mmol/L (ref 134–144)
Total Protein: 7.1 g/dL (ref 6.0–8.5)
eGFR: 93 mL/min/1.73 (ref >59)

## 2024-04-18 LAB — LIPID PANEL W/O CHOL/HDL RATIO
Cholesterol, Total: 129 mg/dL (ref 100–199)
HDL: 35 mg/dL — ABNORMAL LOW (ref >39)
LDL Chol Calc (NIH): 68 mg/dL (ref 0–99)
Triglycerides: 151 mg/dL — ABNORMAL HIGH (ref 0–149)
VLDL Cholesterol Cal: 26 mg/dL (ref 5–40)

## 2024-04-18 LAB — MAGNESIUM: Magnesium: 2.1 mg/dL (ref 1.6–2.3)

## 2024-04-18 LAB — PSA: Prostate Specific Ag, Serum: 1.5 ng/mL (ref 0.0–4.0)

## 2024-04-18 NOTE — Progress Notes (Signed)
 Contacted via MyChart  Good morning Andrew Potts, your labs have returned: - Bilirubin remains mildly elevated, but liver function (AST and ALT) is normal as is kidney function. I would recommend a follow-up with GI thought since you had the Hepatitis treatment.  Would be good to follow-up one more time with them. Do you have their number to call? - Lipid panel shows some elevation in triglycerides, LDL looks better this check.  Continue focus on diet and taking Atorvastatin . - Remainder of labs are stable.  Any questions? Keep being amazing!!  Thank you for allowing me to participate in your care.  I appreciate you. Kindest regards, Xoie Kreuser

## 2024-05-19 DIAGNOSIS — Z87891 Personal history of nicotine dependence: Secondary | ICD-10-CM | POA: Diagnosis not present

## 2024-05-19 DIAGNOSIS — I10 Essential (primary) hypertension: Secondary | ICD-10-CM | POA: Diagnosis not present

## 2024-05-19 DIAGNOSIS — I2510 Atherosclerotic heart disease of native coronary artery without angina pectoris: Secondary | ICD-10-CM | POA: Diagnosis not present

## 2024-05-19 DIAGNOSIS — E782 Mixed hyperlipidemia: Secondary | ICD-10-CM | POA: Diagnosis not present

## 2024-05-19 DIAGNOSIS — I255 Ischemic cardiomyopathy: Secondary | ICD-10-CM | POA: Diagnosis not present

## 2024-06-16 ENCOUNTER — Other Ambulatory Visit: Payer: Self-pay | Admitting: Nurse Practitioner

## 2024-06-18 ENCOUNTER — Other Ambulatory Visit: Payer: Self-pay | Admitting: Nurse Practitioner

## 2024-06-18 MED ORDER — UMECLIDINIUM-VILANTEROL 62.5-25 MCG/ACT IN AEPB: 1.0000 | INHALATION_SPRAY | Freq: Every day | RESPIRATORY_TRACT | 4 refills | Status: AC

## 2024-06-18 NOTE — Telephone Encounter (Signed)
 Prescription Request  06/18/2024  LOV: 04/17/2024  What is the name of the medication or equipment? (ANORO ELLIPTA ) 62.5-25 MCG/ACT AEPB   Have you contacted your pharmacy to request a refill? Yes   Which pharmacy would you like this sent to?  [facility] - [place], [place] - [address] [place] [address] Phone: [phone] Fax: [phone]    Patient notified that their request is being sent to the clinical staff for review and that they should receive a response within 2 business days.   Please advise at Mobile [phone] (mobile)

## 2024-06-19 NOTE — Telephone Encounter (Signed)
 Requested by interface surescripts. Receipt confirmed by pharmacy 06/18/24 at 11:37 am. Duplicate request. Requested Prescriptions  Refused Prescriptions Disp Refills   ANORO ELLIPTA  62.5-25 MCG/ACT AEPB [Pharmacy Med Name: ANORO ELLIPTA  62.5-25 MCG INH] 60 each 0    Sig: Inhale 1 puff into the lungs daily at 6 (six) AM.     Pulmonology:  Combination Products Passed - 06/19/2024 12:05 PM      Passed - Valid encounter within last 12 months    Recent Outpatient Visits           2 months ago Chronic systolic heart failure (HCC)   [facility] [place], Melanie T, NP   8 months ago Simple chronic bronchitis (HCC)   [facility] [place], Melanie DASEN, NP

## 2024-07-06 ENCOUNTER — Ambulatory Visit

## 2024-10-16 ENCOUNTER — Ambulatory Visit: Admitting: Nurse Practitioner

## 2025-01-28 ENCOUNTER — Ambulatory Visit
